# Patient Record
Sex: Male | Born: 1935
Health system: Southern US, Community
[De-identification: ages and names within clinical notes are randomized; demographics above are authoritative.]

## PROBLEM LIST (undated history)

## (undated) DIAGNOSIS — R269 Unspecified abnormalities of gait and mobility: Secondary | ICD-10-CM

## (undated) DIAGNOSIS — E1142 Type 2 diabetes mellitus with diabetic polyneuropathy: Secondary | ICD-10-CM

## (undated) DIAGNOSIS — G629 Polyneuropathy, unspecified: Secondary | ICD-10-CM

## (undated) DIAGNOSIS — J189 Pneumonia, unspecified organism: Secondary | ICD-10-CM

## (undated) DIAGNOSIS — D649 Anemia, unspecified: Secondary | ICD-10-CM

## (undated) DIAGNOSIS — E785 Hyperlipidemia, unspecified: Secondary | ICD-10-CM

## (undated) DIAGNOSIS — I2510 Atherosclerotic heart disease of native coronary artery without angina pectoris: Secondary | ICD-10-CM

## (undated) DIAGNOSIS — K219 Gastro-esophageal reflux disease without esophagitis: Secondary | ICD-10-CM

## (undated) DIAGNOSIS — M21372 Foot drop, left foot: Secondary | ICD-10-CM

## (undated) DIAGNOSIS — I219 Acute myocardial infarction, unspecified: Secondary | ICD-10-CM

## (undated) DIAGNOSIS — I252 Old myocardial infarction: Secondary | ICD-10-CM

## (undated) DIAGNOSIS — M21371 Foot drop, right foot: Secondary | ICD-10-CM

## (undated) DIAGNOSIS — M1990 Unspecified osteoarthritis, unspecified site: Secondary | ICD-10-CM

## (undated) DIAGNOSIS — Z87442 Personal history of urinary calculi: Secondary | ICD-10-CM

## (undated) DIAGNOSIS — I519 Heart disease, unspecified: Secondary | ICD-10-CM

## (undated) DIAGNOSIS — M5416 Radiculopathy, lumbar region: Secondary | ICD-10-CM

## (undated) HISTORY — DX: Hyperlipidemia, unspecified: E78.5

## (undated) HISTORY — DX: Atherosclerotic heart disease of native coronary artery without angina pectoris: I25.10

## (undated) HISTORY — DX: Anemia, unspecified: D64.9

## (undated) HISTORY — DX: Gastro-esophageal reflux disease without esophagitis: K21.9

## (undated) HISTORY — DX: Radiculopathy, lumbar region: M54.16

## (undated) HISTORY — DX: Old myocardial infarction: I25.2

## (undated) HISTORY — PX: COLONOSCOPY: SHX174

## (undated) HISTORY — DX: Type 2 diabetes mellitus with diabetic polyneuropathy: E11.42

## (undated) HISTORY — DX: Unspecified osteoarthritis, unspecified site: M19.90

## (undated) HISTORY — DX: Foot drop, right foot: M21.371

## (undated) HISTORY — DX: Heart disease, unspecified: I51.9

## (undated) HISTORY — PX: TONSILLECTOMY: SHX5217

## (undated) HISTORY — DX: Unspecified abnormalities of gait and mobility: R26.9

## (undated) HISTORY — DX: Foot drop, left foot: M21.372

---

## 1999-09-08 ENCOUNTER — Encounter: Admission: RE | Admit: 1999-09-08 | Discharge: 1999-12-07 | Payer: Self-pay | Admitting: Internal Medicine

## 2000-05-08 ENCOUNTER — Other Ambulatory Visit: Admission: RE | Admit: 2000-05-08 | Discharge: 2000-05-08 | Payer: Self-pay | Admitting: Otolaryngology

## 2002-04-17 DIAGNOSIS — I2510 Atherosclerotic heart disease of native coronary artery without angina pectoris: Secondary | ICD-10-CM

## 2002-04-17 HISTORY — PX: CORONARY ARTERY BYPASS GRAFT: SHX141

## 2002-04-17 HISTORY — DX: Atherosclerotic heart disease of native coronary artery without angina pectoris: I25.10

## 2002-11-06 ENCOUNTER — Inpatient Hospital Stay (HOSPITAL_COMMUNITY): Admission: EM | Admit: 2002-11-06 | Discharge: 2002-11-19 | Payer: Self-pay | Admitting: *Deleted

## 2002-11-06 ENCOUNTER — Encounter: Payer: Self-pay | Admitting: Emergency Medicine

## 2002-11-07 ENCOUNTER — Encounter: Payer: Self-pay | Admitting: Internal Medicine

## 2002-11-08 ENCOUNTER — Encounter: Payer: Self-pay | Admitting: Internal Medicine

## 2002-11-09 ENCOUNTER — Encounter: Payer: Self-pay | Admitting: Internal Medicine

## 2002-11-12 ENCOUNTER — Encounter: Payer: Self-pay | Admitting: Internal Medicine

## 2002-11-14 ENCOUNTER — Encounter: Payer: Self-pay | Admitting: Surgery

## 2002-11-15 ENCOUNTER — Encounter: Payer: Self-pay | Admitting: Surgery

## 2002-11-15 ENCOUNTER — Encounter: Payer: Self-pay | Admitting: Thoracic Surgery (Cardiothoracic Vascular Surgery)

## 2002-11-16 ENCOUNTER — Encounter: Payer: Self-pay | Admitting: Thoracic Surgery (Cardiothoracic Vascular Surgery)

## 2002-11-17 ENCOUNTER — Encounter: Payer: Self-pay | Admitting: Thoracic Surgery (Cardiothoracic Vascular Surgery)

## 2003-01-12 ENCOUNTER — Encounter (HOSPITAL_COMMUNITY): Admission: RE | Admit: 2003-01-12 | Discharge: 2003-04-12 | Payer: Self-pay | Admitting: Cardiology

## 2003-05-01 ENCOUNTER — Encounter: Payer: Self-pay | Admitting: Internal Medicine

## 2004-03-15 ENCOUNTER — Ambulatory Visit: Payer: Self-pay | Admitting: Internal Medicine

## 2004-03-30 ENCOUNTER — Ambulatory Visit: Payer: Self-pay | Admitting: Internal Medicine

## 2004-04-02 ENCOUNTER — Ambulatory Visit: Payer: Self-pay | Admitting: Family Medicine

## 2004-04-06 ENCOUNTER — Ambulatory Visit: Payer: Self-pay | Admitting: Internal Medicine

## 2004-11-14 ENCOUNTER — Ambulatory Visit: Payer: Self-pay | Admitting: Cardiology

## 2004-11-17 ENCOUNTER — Ambulatory Visit: Payer: Self-pay | Admitting: Internal Medicine

## 2004-11-21 ENCOUNTER — Ambulatory Visit: Payer: Self-pay

## 2005-03-22 ENCOUNTER — Ambulatory Visit: Payer: Self-pay | Admitting: Internal Medicine

## 2005-03-27 ENCOUNTER — Ambulatory Visit: Payer: Self-pay | Admitting: Internal Medicine

## 2006-03-23 ENCOUNTER — Ambulatory Visit: Payer: Self-pay | Admitting: Internal Medicine

## 2006-03-23 LAB — CONVERTED CEMR LAB
ALT: 32 units/L (ref 0–40)
AST: 25 units/L (ref 0–37)
Albumin: 4 g/dL (ref 3.5–5.2)
Alkaline Phosphatase: 51 units/L (ref 39–117)
BUN: 24 mg/dL — ABNORMAL HIGH (ref 6–23)
Basophils Absolute: 0 10*3/uL (ref 0.0–0.1)
Basophils Relative: 0 % (ref 0.0–1.0)
CO2: 27 meq/L (ref 19–32)
Calcium: 9.5 mg/dL (ref 8.4–10.5)
Chloride: 104 meq/L (ref 96–112)
Chol/HDL Ratio, serum: 3.6
Cholesterol: 132 mg/dL (ref 0–200)
Creatinine, Ser: 1.3 mg/dL (ref 0.4–1.5)
Creatinine,U: 236.3 mg/dL
Eosinophil percent: 4.5 % (ref 0.0–5.0)
GFR calc non Af Amer: 58 mL/min
Glomerular Filtration Rate, Af Am: 70 mL/min/{1.73_m2}
Glucose, Bld: 173 mg/dL — ABNORMAL HIGH (ref 70–99)
HCT: 39.1 % (ref 39.0–52.0)
HDL: 36.7 mg/dL — ABNORMAL LOW (ref >39.0)
Hemoglobin: 13.3 g/dL (ref 13.0–17.0)
Hgb A1c MFr Bld: 6.9 % — ABNORMAL HIGH (ref 4.6–6.0)
LDL Cholesterol: 84 mg/dL (ref 0–99)
Lymphocytes Relative: 33.9 % (ref 12.0–46.0)
MCHC: 34 g/dL (ref 30.0–36.0)
MCV: 97.2 fL (ref 78.0–100.0)
Microalb Creat Ratio: 2.5 mg/g (ref 0.0–30.0)
Microalb, Ur: 0.6 mg/dL (ref 0.0–1.9)
Monocytes Absolute: 0.5 10*3/uL (ref 0.2–0.7)
Monocytes Relative: 7.5 % (ref 3.0–11.0)
Neutro Abs: 3.2 10*3/uL (ref 1.4–7.7)
Neutrophils Relative %: 54.1 % (ref 43.0–77.0)
PSA: 1.4 ng/mL (ref 0.10–4.00)
Platelets: 180 10*3/uL (ref 150–400)
Potassium: 4.9 meq/L (ref 3.5–5.1)
RBC: 4.02 M/uL — ABNORMAL LOW (ref 4.22–5.81)
RDW: 12.2 % (ref 11.5–14.6)
Sodium: 139 meq/L (ref 135–145)
TSH: 1.1 microintl units/mL (ref 0.35–5.50)
Total Bilirubin: 0.9 mg/dL (ref 0.3–1.2)
Total Protein: 6.4 g/dL (ref 6.0–8.3)
Triglyceride fasting, serum: 56 mg/dL (ref 0–149)
VLDL: 11 mg/dL (ref 0–40)
WBC: 6.1 10*3/uL (ref 4.5–10.5)

## 2006-03-30 ENCOUNTER — Ambulatory Visit: Payer: Self-pay | Admitting: Internal Medicine

## 2006-04-05 ENCOUNTER — Ambulatory Visit: Payer: Self-pay | Admitting: Cardiology

## 2007-03-26 ENCOUNTER — Ambulatory Visit: Payer: Self-pay | Admitting: Internal Medicine

## 2007-03-26 LAB — CONVERTED CEMR LAB
ALT: 34 units/L (ref 0–53)
AST: 26 units/L (ref 0–37)
Albumin: 4 g/dL (ref 3.5–5.2)
Alkaline Phosphatase: 60 units/L (ref 39–117)
BUN: 22 mg/dL (ref 6–23)
Basophils Absolute: 0 10*3/uL (ref 0.0–0.1)
Basophils Relative: 0.5 % (ref 0.0–1.0)
Bilirubin Urine: NEGATIVE
Bilirubin, Direct: 0.2 mg/dL (ref 0.0–0.3)
Blood in Urine, dipstick: NEGATIVE
CO2: 28 meq/L (ref 19–32)
Calcium: 9.5 mg/dL (ref 8.4–10.5)
Chloride: 103 meq/L (ref 96–112)
Cholesterol: 152 mg/dL (ref 0–200)
Creatinine, Ser: 1.1 mg/dL (ref 0.4–1.5)
Creatinine,U: 198.4 mg/dL
Eosinophils Absolute: 0.4 10*3/uL (ref 0.0–0.6)
Eosinophils Relative: 5.4 % — ABNORMAL HIGH (ref 0.0–5.0)
GFR calc Af Amer: 84 mL/min
GFR calc non Af Amer: 70 mL/min
Glucose, Bld: 221 mg/dL — ABNORMAL HIGH (ref 70–99)
HCT: 36.3 % — ABNORMAL LOW (ref 39.0–52.0)
HDL: 28.6 mg/dL — ABNORMAL LOW (ref >39.0)
Hemoglobin: 12.7 g/dL — ABNORMAL LOW (ref 13.0–17.0)
Hgb A1c MFr Bld: 8.7 % — ABNORMAL HIGH (ref 4.6–6.0)
LDL Cholesterol: 99 mg/dL (ref 0–99)
Lymphocytes Relative: 37.4 % (ref 12.0–46.0)
MCHC: 35 g/dL (ref 30.0–36.0)
MCV: 95.7 fL (ref 78.0–100.0)
Microalb Creat Ratio: 5.5 mg/g (ref 0.0–30.0)
Microalb, Ur: 1.1 mg/dL (ref 0.0–1.9)
Monocytes Absolute: 0.5 10*3/uL (ref 0.2–0.7)
Monocytes Relative: 6.8 % (ref 3.0–11.0)
Neutro Abs: 3.5 10*3/uL (ref 1.4–7.7)
Neutrophils Relative %: 49.9 % (ref 43.0–77.0)
Nitrite: NEGATIVE
PSA: 1.41 ng/mL (ref 0.10–4.00)
Platelets: 173 10*3/uL (ref 150–400)
Potassium: 4.7 meq/L (ref 3.5–5.1)
RBC: 3.79 M/uL — ABNORMAL LOW (ref 4.22–5.81)
RDW: 12 % (ref 11.5–14.6)
Sodium: 138 meq/L (ref 135–145)
Specific Gravity, Urine: >=1.03
TSH: 1.17 microintl units/mL (ref 0.35–5.50)
Total Bilirubin: 1 mg/dL (ref 0.3–1.2)
Total CHOL/HDL Ratio: 5.3
Total Protein: 6.3 g/dL (ref 6.0–8.3)
Triglycerides: 123 mg/dL (ref 0–149)
Urobilinogen, UA: NEGATIVE
VLDL: 25 mg/dL (ref 0–40)
WBC Urine, dipstick: NEGATIVE
WBC: 7.1 10*3/uL (ref 4.5–10.5)
pH: 5.5

## 2007-04-19 DIAGNOSIS — E785 Hyperlipidemia, unspecified: Secondary | ICD-10-CM | POA: Insufficient documentation

## 2007-04-19 DIAGNOSIS — E119 Type 2 diabetes mellitus without complications: Secondary | ICD-10-CM | POA: Insufficient documentation

## 2007-04-19 DIAGNOSIS — I252 Old myocardial infarction: Secondary | ICD-10-CM

## 2007-04-19 DIAGNOSIS — K219 Gastro-esophageal reflux disease without esophagitis: Secondary | ICD-10-CM | POA: Insufficient documentation

## 2007-04-19 HISTORY — DX: Old myocardial infarction: I25.2

## 2007-04-25 ENCOUNTER — Telehealth: Payer: Self-pay | Admitting: Internal Medicine

## 2007-04-26 ENCOUNTER — Ambulatory Visit: Payer: Self-pay | Admitting: Internal Medicine

## 2007-05-16 ENCOUNTER — Ambulatory Visit: Payer: Self-pay | Admitting: Internal Medicine

## 2007-05-30 ENCOUNTER — Ambulatory Visit: Payer: Self-pay

## 2007-05-30 ENCOUNTER — Encounter: Payer: Self-pay | Admitting: Internal Medicine

## 2007-08-05 ENCOUNTER — Telehealth: Payer: Self-pay | Admitting: Internal Medicine

## 2007-08-16 ENCOUNTER — Ambulatory Visit: Payer: Self-pay | Admitting: Internal Medicine

## 2007-08-16 LAB — CONVERTED CEMR LAB
AST: 25 units/L (ref 0–37)
Calcium: 9.4 mg/dL (ref 8.4–10.5)
Chloride: 103 meq/L (ref 96–112)
Creatinine, Ser: 1 mg/dL (ref 0.4–1.5)
GFR calc non Af Amer: 78 mL/min
Hgb A1c MFr Bld: 13.5 % — ABNORMAL HIGH (ref 4.6–6.0)
LDL Cholesterol: 119 mg/dL — ABNORMAL HIGH (ref 0–99)
Total Bilirubin: 0.9 mg/dL (ref 0.3–1.2)
Total CHOL/HDL Ratio: 6
Triglycerides: 173 mg/dL — ABNORMAL HIGH (ref 0–149)

## 2007-08-22 ENCOUNTER — Ambulatory Visit: Payer: Self-pay | Admitting: Internal Medicine

## 2007-09-05 ENCOUNTER — Ambulatory Visit: Payer: Self-pay | Admitting: Internal Medicine

## 2007-10-28 ENCOUNTER — Ambulatory Visit: Payer: Self-pay | Admitting: Internal Medicine

## 2007-12-19 ENCOUNTER — Encounter: Payer: Self-pay | Admitting: Internal Medicine

## 2007-12-25 ENCOUNTER — Encounter: Admission: RE | Admit: 2007-12-25 | Discharge: 2007-12-25 | Payer: Self-pay | Admitting: Internal Medicine

## 2008-01-20 ENCOUNTER — Ambulatory Visit: Payer: Self-pay | Admitting: Internal Medicine

## 2008-01-21 LAB — CONVERTED CEMR LAB
Albumin: 4.1 g/dL (ref 3.5–5.2)
BUN: 31 mg/dL — ABNORMAL HIGH (ref 6–23)
Calcium: 9.3 mg/dL (ref 8.4–10.5)
Cholesterol: 148 mg/dL (ref 0–200)
Creatinine, Ser: 1.1 mg/dL (ref 0.4–1.5)
GFR calc Af Amer: 84 mL/min
Glucose, Bld: 153 mg/dL — ABNORMAL HIGH (ref 70–99)
HDL: 30.5 mg/dL — ABNORMAL LOW (ref >39.0)
Total Protein: 6.4 g/dL (ref 6.0–8.3)
VLDL: 26 mg/dL (ref 0–40)

## 2008-01-27 ENCOUNTER — Ambulatory Visit: Payer: Self-pay | Admitting: Internal Medicine

## 2008-04-20 ENCOUNTER — Ambulatory Visit: Payer: Self-pay | Admitting: Internal Medicine

## 2008-04-20 DIAGNOSIS — I6529 Occlusion and stenosis of unspecified carotid artery: Secondary | ICD-10-CM | POA: Insufficient documentation

## 2008-04-27 ENCOUNTER — Telehealth: Payer: Self-pay | Admitting: Internal Medicine

## 2008-06-02 ENCOUNTER — Ambulatory Visit: Payer: Self-pay

## 2008-06-03 ENCOUNTER — Ambulatory Visit: Payer: Self-pay | Admitting: Internal Medicine

## 2008-06-03 LAB — CONVERTED CEMR LAB
ALT: 29 units/L (ref 0–53)
AST: 24 units/L (ref 0–37)
Alkaline Phosphatase: 69 units/L (ref 39–117)
BUN: 25 mg/dL — ABNORMAL HIGH (ref 6–23)
CO2: 27 meq/L (ref 19–32)
Chloride: 103 meq/L (ref 96–112)
Cholesterol: 158 mg/dL (ref 0–200)
Creatinine, Ser: 1.1 mg/dL (ref 0.4–1.5)
Hgb A1c MFr Bld: 8.7 % — ABNORMAL HIGH (ref 4.6–6.0)
Total Bilirubin: 1 mg/dL (ref 0.3–1.2)
Total CHOL/HDL Ratio: 5
Triglycerides: 149 mg/dL (ref 0–149)

## 2008-06-10 ENCOUNTER — Ambulatory Visit: Payer: Self-pay | Admitting: Internal Medicine

## 2008-10-07 ENCOUNTER — Ambulatory Visit: Payer: Self-pay | Admitting: Internal Medicine

## 2008-10-07 LAB — CONVERTED CEMR LAB
AST: 22 units/L (ref 0–37)
Albumin: 3.7 g/dL (ref 3.5–5.2)
Alkaline Phosphatase: 53 units/L (ref 39–117)
Calcium: 9.2 mg/dL (ref 8.4–10.5)
Creatinine, Ser: 1.1 mg/dL (ref 0.4–1.5)
Hgb A1c MFr Bld: 7.4 % — ABNORMAL HIGH (ref 4.6–6.5)
Sodium: 141 meq/L (ref 135–145)
Total CHOL/HDL Ratio: 6
Total Protein: 6.4 g/dL (ref 6.0–8.3)
VLDL: 35.2 mg/dL (ref 0.0–40.0)

## 2008-10-14 ENCOUNTER — Ambulatory Visit: Payer: Self-pay | Admitting: Internal Medicine

## 2008-10-23 ENCOUNTER — Ambulatory Visit: Payer: Self-pay | Admitting: Internal Medicine

## 2008-12-10 ENCOUNTER — Encounter: Payer: Self-pay | Admitting: Internal Medicine

## 2008-12-17 ENCOUNTER — Telehealth: Payer: Self-pay | Admitting: Internal Medicine

## 2008-12-22 ENCOUNTER — Emergency Department (HOSPITAL_COMMUNITY): Admission: EM | Admit: 2008-12-22 | Discharge: 2008-12-22 | Payer: Self-pay | Admitting: Emergency Medicine

## 2008-12-22 ENCOUNTER — Ambulatory Visit: Payer: Self-pay | Admitting: Family Medicine

## 2009-01-25 ENCOUNTER — Encounter: Payer: Self-pay | Admitting: Internal Medicine

## 2009-02-09 ENCOUNTER — Ambulatory Visit: Payer: Self-pay | Admitting: Internal Medicine

## 2009-02-23 ENCOUNTER — Telehealth: Payer: Self-pay | Admitting: Internal Medicine

## 2009-02-24 ENCOUNTER — Telehealth: Payer: Self-pay | Admitting: Internal Medicine

## 2009-03-03 ENCOUNTER — Encounter (INDEPENDENT_AMBULATORY_CARE_PROVIDER_SITE_OTHER): Payer: Self-pay | Admitting: *Deleted

## 2009-03-05 ENCOUNTER — Encounter (INDEPENDENT_AMBULATORY_CARE_PROVIDER_SITE_OTHER): Payer: Self-pay | Admitting: *Deleted

## 2009-04-26 ENCOUNTER — Encounter: Payer: Self-pay | Admitting: Internal Medicine

## 2009-05-03 ENCOUNTER — Ambulatory Visit: Payer: Self-pay | Admitting: Internal Medicine

## 2009-05-03 LAB — CONVERTED CEMR LAB
ALT: 31 units/L (ref 0–53)
AST: 25 units/L (ref 0–37)
Albumin: 4.1 g/dL (ref 3.5–5.2)
Basophils Absolute: 0 10*3/uL (ref 0.0–0.1)
Bilirubin Urine: NEGATIVE
CO2: 21 meq/L (ref 19–32)
Chloride: 107 meq/L (ref 96–112)
Creatinine,U: 124.2 mg/dL
GFR calc non Af Amer: 69.15 mL/min (ref >60)
Glucose, Bld: 211 mg/dL — ABNORMAL HIGH (ref 70–99)
Glucose, Urine, Semiquant: NEGATIVE
HCT: 38 % — ABNORMAL LOW (ref 39.0–52.0)
Hemoglobin: 12.6 g/dL — ABNORMAL LOW (ref 13.0–17.0)
Lymphs Abs: 2.3 10*3/uL (ref 0.7–4.0)
MCHC: 33.1 g/dL (ref 30.0–36.0)
MCV: 94.4 fL (ref 78.0–100.0)
Monocytes Relative: 8 % (ref 3.0–12.0)
Neutro Abs: 4.3 10*3/uL (ref 1.4–7.7)
Potassium: 4.8 meq/L (ref 3.5–5.1)
RDW: 12.1 % (ref 11.5–14.6)
Sodium: 141 meq/L (ref 135–145)
Specific Gravity, Urine: 1.025
TSH: 1.45 microintl units/mL (ref 0.35–5.50)
WBC Urine, dipstick: NEGATIVE
pH: 5

## 2009-05-11 ENCOUNTER — Ambulatory Visit: Payer: Self-pay | Admitting: Internal Medicine

## 2009-05-11 ENCOUNTER — Encounter (INDEPENDENT_AMBULATORY_CARE_PROVIDER_SITE_OTHER): Payer: Self-pay | Admitting: *Deleted

## 2009-05-21 ENCOUNTER — Ambulatory Visit: Payer: Self-pay | Admitting: Internal Medicine

## 2009-05-21 DIAGNOSIS — I25810 Atherosclerosis of coronary artery bypass graft(s) without angina pectoris: Secondary | ICD-10-CM | POA: Insufficient documentation

## 2009-05-26 ENCOUNTER — Telehealth: Payer: Self-pay | Admitting: Internal Medicine

## 2009-05-27 ENCOUNTER — Telehealth: Payer: Self-pay | Admitting: Internal Medicine

## 2009-05-31 ENCOUNTER — Ambulatory Visit: Payer: Self-pay | Admitting: Internal Medicine

## 2009-06-03 ENCOUNTER — Telehealth: Payer: Self-pay | Admitting: Internal Medicine

## 2009-06-03 ENCOUNTER — Encounter: Payer: Self-pay | Admitting: Internal Medicine

## 2009-06-03 LAB — CONVERTED CEMR LAB
Folate: >20 ng/mL
Saturation Ratios: 19.1 % — ABNORMAL LOW (ref 20.0–50.0)
Transferrin: 302.3 mg/dL (ref 212.0–360.0)
Vitamin B-12: >1500 pg/mL — ABNORMAL HIGH (ref 211–911)

## 2009-06-04 ENCOUNTER — Encounter: Payer: Self-pay | Admitting: Internal Medicine

## 2009-06-04 ENCOUNTER — Ambulatory Visit: Payer: Self-pay

## 2009-07-13 ENCOUNTER — Encounter (INDEPENDENT_AMBULATORY_CARE_PROVIDER_SITE_OTHER): Payer: Self-pay | Admitting: *Deleted

## 2009-07-14 ENCOUNTER — Ambulatory Visit: Payer: Self-pay | Admitting: Internal Medicine

## 2009-07-14 ENCOUNTER — Encounter (INDEPENDENT_AMBULATORY_CARE_PROVIDER_SITE_OTHER): Payer: Self-pay | Admitting: *Deleted

## 2009-07-15 ENCOUNTER — Telehealth: Payer: Self-pay | Admitting: Internal Medicine

## 2009-07-16 ENCOUNTER — Telehealth: Payer: Self-pay | Admitting: Internal Medicine

## 2009-07-21 ENCOUNTER — Ambulatory Visit: Payer: Self-pay | Admitting: Internal Medicine

## 2009-07-22 ENCOUNTER — Encounter: Payer: Self-pay | Admitting: Internal Medicine

## 2009-08-10 ENCOUNTER — Telehealth (INDEPENDENT_AMBULATORY_CARE_PROVIDER_SITE_OTHER): Payer: Self-pay | Admitting: *Deleted

## 2009-08-30 ENCOUNTER — Ambulatory Visit: Payer: Self-pay | Admitting: Internal Medicine

## 2009-09-01 LAB — CONVERTED CEMR LAB
Basophils Absolute: 0 10*3/uL (ref 0.0–0.1)
Eosinophils Absolute: 0.2 10*3/uL (ref 0.0–0.7)
Lymphocytes Relative: 31.7 % (ref 12.0–46.0)
MCHC: 35 g/dL (ref 30.0–36.0)
Neutrophils Relative %: 56.6 % (ref 43.0–77.0)
Platelets: 217 10*3/uL (ref 150.0–400.0)
RDW: 13.4 % (ref 11.5–14.6)

## 2009-09-02 ENCOUNTER — Ambulatory Visit: Payer: Self-pay | Admitting: Internal Medicine

## 2009-09-02 LAB — CONVERTED CEMR LAB
ALT: 27 units/L (ref 0–53)
AST: 22 units/L (ref 0–37)
BUN: 33 mg/dL — ABNORMAL HIGH (ref 6–23)
Bilirubin, Direct: 0.1 mg/dL (ref 0.0–0.3)
CO2: 27 meq/L (ref 19–32)
Chloride: 104 meq/L (ref 96–112)
Cholesterol: 157 mg/dL (ref 0–200)
Creatinine, Ser: 1.1 mg/dL (ref 0.4–1.5)
Hgb A1c MFr Bld: 8.1 % — ABNORMAL HIGH (ref 4.6–6.5)
Total Protein: 6.3 g/dL (ref 6.0–8.3)
Triglycerides: 153 mg/dL — ABNORMAL HIGH (ref 0.0–149.0)

## 2009-09-07 ENCOUNTER — Ambulatory Visit: Payer: Self-pay | Admitting: Internal Medicine

## 2009-09-07 DIAGNOSIS — Z860100 Personal history of colon polyps, unspecified: Secondary | ICD-10-CM | POA: Insufficient documentation

## 2009-09-07 DIAGNOSIS — D509 Iron deficiency anemia, unspecified: Secondary | ICD-10-CM | POA: Insufficient documentation

## 2009-09-09 ENCOUNTER — Ambulatory Visit: Payer: Self-pay | Admitting: Internal Medicine

## 2009-09-20 ENCOUNTER — Ambulatory Visit: Payer: Self-pay | Admitting: Family Medicine

## 2009-09-29 ENCOUNTER — Ambulatory Visit: Payer: Self-pay | Admitting: Internal Medicine

## 2009-12-02 ENCOUNTER — Encounter: Payer: Self-pay | Admitting: Internal Medicine

## 2009-12-03 ENCOUNTER — Ambulatory Visit: Payer: Self-pay

## 2009-12-03 ENCOUNTER — Encounter: Payer: Self-pay | Admitting: Internal Medicine

## 2009-12-15 ENCOUNTER — Encounter: Payer: Self-pay | Admitting: Internal Medicine

## 2009-12-15 LAB — HM DIABETES EYE EXAM: HM Diabetic Eye Exam: NORMAL

## 2009-12-29 ENCOUNTER — Telehealth: Payer: Self-pay | Admitting: Internal Medicine

## 2009-12-31 ENCOUNTER — Ambulatory Visit: Payer: Self-pay | Admitting: Internal Medicine

## 2010-01-10 ENCOUNTER — Ambulatory Visit: Payer: Self-pay | Admitting: Internal Medicine

## 2010-01-18 ENCOUNTER — Ambulatory Visit: Payer: Self-pay | Admitting: Internal Medicine

## 2010-01-18 LAB — CONVERTED CEMR LAB
BUN: 31 mg/dL — ABNORMAL HIGH (ref 6–23)
Basophils Relative: 0.7 % (ref 0.0–3.0)
Chloride: 104 meq/L (ref 96–112)
Cholesterol: 164 mg/dL (ref 0–200)
Eosinophils Relative: 5.1 % — ABNORMAL HIGH (ref 0.0–5.0)
HCT: 35.3 % — ABNORMAL LOW (ref 39.0–52.0)
Hemoglobin: 12.2 g/dL — ABNORMAL LOW (ref 13.0–17.0)
LDL Cholesterol: 104 mg/dL — ABNORMAL HIGH (ref 0–99)
Lymphs Abs: 2 10*3/uL (ref 0.7–4.0)
MCV: 94.1 fL (ref 78.0–100.0)
Monocytes Absolute: 0.5 10*3/uL (ref 0.1–1.0)
Monocytes Relative: 8.9 % (ref 3.0–12.0)
Neutro Abs: 2.6 10*3/uL (ref 1.4–7.7)
Platelets: 195 10*3/uL (ref 150.0–400.0)
Potassium: 4.6 meq/L (ref 3.5–5.1)
RBC: 3.75 M/uL — ABNORMAL LOW (ref 4.22–5.81)
Sodium: 138 meq/L (ref 135–145)
WBC: 5.5 10*3/uL (ref 4.5–10.5)

## 2010-01-26 ENCOUNTER — Telehealth: Payer: Self-pay | Admitting: Internal Medicine

## 2010-01-28 LAB — CONVERTED CEMR LAB
FSH: 13.8 milliintl units/mL (ref 1.4–18.1)
PSA: 1.11 ng/mL (ref 0.10–4.00)
Prolactin: 12 ng/mL (ref 2.1–17.1)

## 2010-01-31 ENCOUNTER — Telehealth: Payer: Self-pay | Admitting: Internal Medicine

## 2010-02-04 ENCOUNTER — Ambulatory Visit: Payer: Self-pay | Admitting: Internal Medicine

## 2010-02-04 DIAGNOSIS — E291 Testicular hypofunction: Secondary | ICD-10-CM | POA: Insufficient documentation

## 2010-02-08 ENCOUNTER — Ambulatory Visit: Payer: Self-pay | Admitting: Internal Medicine

## 2010-03-08 ENCOUNTER — Ambulatory Visit: Payer: Self-pay | Admitting: Internal Medicine

## 2010-04-04 ENCOUNTER — Ambulatory Visit: Payer: Self-pay | Admitting: Internal Medicine

## 2010-04-20 ENCOUNTER — Telehealth: Payer: Self-pay | Admitting: Internal Medicine

## 2010-05-09 ENCOUNTER — Other Ambulatory Visit: Payer: Self-pay | Admitting: Internal Medicine

## 2010-05-09 ENCOUNTER — Ambulatory Visit
Admission: RE | Admit: 2010-05-09 | Discharge: 2010-05-09 | Payer: Self-pay | Source: Home / Self Care | Attending: Internal Medicine | Admitting: Internal Medicine

## 2010-05-09 ENCOUNTER — Telehealth: Payer: Self-pay | Admitting: Internal Medicine

## 2010-05-09 LAB — CONVERTED CEMR LAB
Bilirubin Urine: NEGATIVE
Glucose, Urine, Semiquant: NEGATIVE
Ketones, urine, test strip: NEGATIVE
Specific Gravity, Urine: 1.02

## 2010-05-09 LAB — HEPATIC FUNCTION PANEL
ALT: 24 U/L (ref 0–53)
AST: 22 U/L (ref 0–37)
Albumin: 4 g/dL (ref 3.5–5.2)
Alkaline Phosphatase: 53 U/L (ref 39–117)
Total Protein: 6.4 g/dL (ref 6.0–8.3)

## 2010-05-09 LAB — CBC WITH DIFFERENTIAL/PLATELET
Basophils Absolute: 0 10*3/uL (ref 0.0–0.1)
Basophils Relative: 0.6 % (ref 0.0–3.0)
HCT: 41.8 % (ref 39.0–52.0)
Hemoglobin: 14.1 g/dL (ref 13.0–17.0)
Lymphocytes Relative: 34.1 % (ref 12.0–46.0)
Lymphs Abs: 2.3 10*3/uL (ref 0.7–4.0)
MCHC: 33.8 g/dL (ref 30.0–36.0)
Monocytes Relative: 9 % (ref 3.0–12.0)
Neutro Abs: 3.5 10*3/uL (ref 1.4–7.7)
RBC: 4.4 Mil/uL (ref 4.22–5.81)
RDW: 13.1 % (ref 11.5–14.6)

## 2010-05-09 LAB — TSH: TSH: 1.36 u[IU]/mL (ref 0.35–5.50)

## 2010-05-09 LAB — BASIC METABOLIC PANEL
CO2: 25 mEq/L (ref 19–32)
GFR: 65.52 mL/min (ref >60.00)
Glucose, Bld: 217 mg/dL — ABNORMAL HIGH (ref 70–99)
Potassium: 4.7 mEq/L (ref 3.5–5.1)
Sodium: 135 mEq/L (ref 135–145)

## 2010-05-09 LAB — LIPID PANEL: Total CHOL/HDL Ratio: 5

## 2010-05-09 LAB — MICROALBUMIN / CREATININE URINE RATIO: Microalb Creat Ratio: 1.7 mg/g (ref 0.0–30.0)

## 2010-05-09 LAB — PSA: PSA: 1.61 ng/mL (ref 0.10–4.00)

## 2010-05-16 ENCOUNTER — Ambulatory Visit
Admission: RE | Admit: 2010-05-16 | Discharge: 2010-05-16 | Payer: Self-pay | Source: Home / Self Care | Attending: Internal Medicine | Admitting: Internal Medicine

## 2010-05-17 NOTE — Progress Notes (Signed)
Summary: testosterone lab check  Phone Note Call from Patient Call back at Work Phone [phone] Call back at [phone]   Caller: vm Summary of Call: Dr. Marina Goodell, when I had colonoscopy, said to check for low Testosterone the next time I have labs done.  Having labs Fri, Sept 16.   Maybe he's done it, never have discussed with Dr. Cato Mulligan.  Want to be sure it's on the list of things to check.  Any questions call me back.   Initial call taken by: Rudy Jew, RN,  December 29, 2009 4:09 PM  Follow-up for Phone Call        ok to draw Follow-up by: Birdie Sons MD,  December 30, 2009 1:33 PM  Additional Follow-up for Phone Call Additional follow up Details #1::        done  Additional Follow-up by: Lynann Beaver CMA,  December 30, 2009 5:05 PM

## 2010-05-17 NOTE — Letter (Signed)
Summary: Patient Notice- Polyp Results  [facility]  [address] [place], [place] [address]   Phone: [phone]  Fax: [phone]        July 22, 2009 MRN: [id number]    [facility] [address] [place], [place]  [address]    Dear Mr. Mccaskill,  I am pleased to inform you that the colon polyp(s) removed during your recent colonoscopy was (were) found to be benign (no cancer detected) upon pathologic examination.  I recommend you have a repeat colonoscopy examination in 5 years to look for recurrent polyps, as having colon polyps increases your risk for having recurrent polyps or even colon cancer in the future.    ALSO, THE STOMACH POLYPS WERE BENIGN,AS EXPECTED, WITH NOTHING FURTHER NEEDING TO BE DONE.   Should you develop new or worsening symptoms of abdominal pain, bowel habit changes or bleeding from the rectum or bowels, please schedule an evaluation with either your primary care physician or with me.    Additional information/recommendations:  __ No further action with gastroenterology is needed at this time. Please      follow-up with your primary care physician for your other healthcare      needs.   Please call us if you are having persistent problems or have questions about your condition that have not been fully answered at this time.  Sincerely,  Hilarie Fredrickson MD  This letter has been electronically signed by your physician.  Appended Document: Patient Notice- Polyp Results letter mailed 4.11.11

## 2010-05-17 NOTE — Procedures (Signed)
Summary: Colonoscopy Report/[facility]  Colonoscopy Report/ Endo   Imported By: Maryln Gottron 05/12/2009 15:58:50  _____________________________________________________________________  External Attachment:    Type:   Image     Comment:   External Document

## 2010-05-17 NOTE — Progress Notes (Signed)
Summary: Pharmacy need to verify medication   Phone Note From Pharmacy   [phone] Summary of Call: Pharmacy need to verify Patanol Initial call taken by: Judie Grieve,  May 27, 2009 3:43 PM  Follow-up for Phone Call        This has been done.  See phone note to Dr Cato Mulligan. Follow-up by: Gladis Riffle, RN,  May 27, 2009 4:24 PM  Additional Follow-up for Phone Call Additional follow up Details #1::        F/u in medicine for patanol Additional Follow-up by: Sherrill Raring, MD, [facility],  May 28, 2009 6:13 PM

## 2010-05-17 NOTE — Assessment & Plan Note (Signed)
Summary: f/u post procedure (anemia)   History of Present Illness Visit Type: Follow-up Visit Primary GI MD: Yancey Flemings MD Primary Provider: Birdie Sons MD Requesting Provider: n/a Chief Complaint: iron deficient anemia, pt has no complaints since procedure History of Present Illness:   77 year old white male with multiple medical problems including coronary artery disease status post coronary artery bypass grafting, type 2 diabetes mellitus, hyperlipidemia, and GERD. The patient was evaluated in the office May 31, 2009 for anemia. This was normocytic. He subsequently underwent an anemia panel which suggested iron deficiency. He subsequently underwent colonoscopy and upper endoscopy on July 22, 1999. Upper endoscopy revealed pedunculated gastric polyps which were biopsied and returned benign. Also mild nonerosive duodenitis. Negative H. pylori testing. Otherwise normal. Colonoscopy revealed a diminutive sigmoid colon polyp, moderate diverticulosis, and internal hemorrhoids. Otherwise normal. The ileum was normal. The polyp was adenomatous and follow up in 5 years recommended. He has continued on twice daily. He states he is feeling quite well. No fatigue. He states his blood sugar has been under better control. GI review of systems is entirely negative. Reviewed his workup in detail. Multiple questions answered to his satisfaction.   GI Review of Systems      Denies abdominal pain, acid reflux, belching, bloating, chest pain, dysphagia with liquids, dysphagia with solids, heartburn, loss of appetite, nausea, vomiting, vomiting blood, weight loss, and  weight gain.        Denies anal fissure, black tarry stools, change in bowel habit, constipation, diarrhea, diverticulosis, fecal incontinence, heme positive stool, hemorrhoids, irritable bowel syndrome, jaundice, light color stool, liver problems, rectal bleeding, and  rectal pain.    Current Medications (verified): 1)  Fluticasone  Propionate 50 Mcg/act  Susp (Fluticasone Propionate) .... One Spray Each Nostril Every Day 2)  Altace 10 Mg Caps (Ramipril) .... Take 1 Capsule By Mouth Once A Day 3)  Diclofenac Sodium 75 Mg Tbec (Diclofenac Sodium) .... Take 1 Tablet By Mouth Twice A Day 4)  Famotidine 20 Mg Tabs (Famotidine) .... Take 1 Tablet By Mouth Twice A Day As Needed 5)  Patanol 0.1 % Soln (Olopatadine Hcl) .... Apply 1 Drop Into Both Eyes Twice A Day 6)  Simvastatin 40 Mg Tabs (Simvastatin) .... Take 1 Tablet By Mouth At Bedtime 7)  Toprol Xl 25 Mg Tb24 (Metoprolol Succinate) .... Take 1 Tablet By Mouth Once A Day 8)  Glyburide-Metformin 5-500 Mg Tabs (Glyburide-Metformin) .... Take 1 Tablet By Mouth Two Times A Day 9)  Freestyle Lite Test   Strp (Glucose Blood) .... Use Once Daily As Directed 10)  Freestyle Unistick Ii Lancets   Misc (Lancets) .... Use As Directed 11)  Daily Multi  Tabs (Multiple Vitamins-Minerals) .... Take One By Mouth Once Daily 12)  Fish Oil 1000 Mg Caps (Omega-3 Fatty Acids) .... Take One By Mouth Once Daily 13)  Ocuvite  Tabs (Multiple Vitamins-Minerals) .... Take Two Tabs By Mouth Once Daily 14)  Ferrous Sulfate 325 (65 Fe) Mg Tabs (Ferrous Sulfate) .... Take 1 Tablet By Mouth  Two Times A Day  Allergies (verified): No Known Drug Allergies  Past History:  Past Medical History: Reviewed history from 05/31/2009 and no changes required. Coronary artery disease. S/P inferior wall MI 2004 with PTCA/Stent; s/p CABG 2004 Diabetes mellitus, type II GERD Hyperlipidemia Myocardial infarction, hx of Anemia  Past Surgical History: Reviewed history from 05/31/2009 and no changes required. Coronary artery bypass graft 2004 (LIMA to LAD; SVG to ramus intermedius; SVG to PDA/PLSA Tonsillectomy  Family  History: Reviewed history from 05/31/2009 and no changes required. father died at age 63 --hx CVA, hypertension mother died age 87 some form cirrhosis,not clear of etiology Blood  transfusions aunt died with cirrohisis related complications  age 31 blood transfusions two sisters--one with breast cancer age 96 paternal grandparents died in mid 60's of stroke maternal grandparents--grandmotherdied of stroke age 61, grandfather died of MI age 78 No FH of Colon Cancer:  Social History: Reviewed history from 05/31/2009 and no changes required. Occupation:-financial Former Smoker pipe Alcohol use-yes Regular exercise-no Daily Caffeine Use coffee 3 cups a day Illicit Drug Use - no  Review of Systems  The patient denies allergy/sinus, anemia, anxiety-new, arthritis/joint pain, back pain, blood in urine, breast changes/lumps, change in vision, confusion, cough, coughing up blood, depression-new, fainting, fatigue, fever, headaches-new, hearing problems, heart murmur, heart rhythm changes, itching, muscle pains/cramps, night sweats, nosebleeds, shortness of breath, skin rash, sleeping problems, sore throat, swelling of feet/legs, swollen lymph glands, thirst - excessive, urination - excessive, urination changes/pain, urine leakage, and voice change.    Vital Signs:  Patient profile:   77 year old male Height:      66 inches Weight:      183 pounds BMI:     29.64 Pulse rate:   64 / minute Pulse rhythm:   regular BP sitting:   104 / 60  (left arm) Cuff size:   regular  Vitals Entered By: Francee Piccolo CMA Duncan Dull) (Sep 07, 2009 2:52 PM)  Physical Exam  General:  Well developed, well nourished, no acute distress. Abdomen:  not reexamined Pulses:  Normal pulses noted. Neurologic:  Alert and  oriented x4;  Skin:  normal pallor Psych:  Alert and cooperative. Normal mood and affect.   Impression & Recommendations:  Problem # 1:  ANEMIA-IRON DEFICIENCY (ICD-280.9) mild anemia, possibly iron deficient. Currently asymptomatic with a hemoglobin 12.6 on iron. GI evaluations as described.  Plan: #1. Continue iron, once daily #2. Periodic CBC with Dr. Cato Mulligan #3.  GI followup p.r.n.  Problem # 2:  PERSONAL HX COLONIC POLYPS (ICD-V12.72) adenomatous colon polyp on recent colonoscopy.  Plan: #1. Colonoscopy in 5 years if medically fit and willing  Patient Instructions: 1)  Please schedule a follow-up appointment as needed.  2)  The medication list was reviewed and reconciled.  All changed / newly prescribed medications were explained.  A complete medication list was provided to the patient / caregiver.

## 2010-05-17 NOTE — Assessment & Plan Note (Signed)
Summary: 4 mo rov/mm   Vital Signs:  Patient profile:   77 year old male Weight:      182 pounds Pulse rate:   76 / minute Pulse rhythm:   regular Resp:     12 per minute BP sitting:   110 / 58  (left arm) Cuff size:   regular  Vitals Entered By: Gladis Riffle, RN (Sep 09, 2009 10:00 AM) CC: 4 month rov--CBGs not done regularly at home Is Patient Diabetic? Yes Did you bring your meter with you today? No   Primary Care Provider:  Birdie Sons MD  CC:  4 month rov--CBGs not done regularly at home.  History of Present Illness:  Follow-Up Visit      This is a 77 year old man who presents for Follow-up visit.  The patient denies chest pain and palpitations.  Since the last visit the patient notes no new problems or concerns.  The patient reports taking meds as prescribed.  When questioned about possible medication side effects, the patient notes none.    All other systems reviewed and were negative   Preventive Screening-Counseling & Management  Alcohol-Tobacco     Smoking Status: quit     Year Started: pipe smoker  Current Problems (verified): 1)  Personal Hx Colonic Polyps  (ICD-V12.72) 2)  Anemia-iron Deficiency  (ICD-280.9) 3)  Duodenitis  (ICD-535.60) 4)  Cad, Artery Bypass Graft  (ICD-414.04) 5)  Anemia, Other Unspec  (ICD-285.9) 6)  Myocardial Infarction, Hx of  (ICD-412) 7)  Carotid Artery Stenosis, Without Infarction  (ICD-433.10) 8)  Hyperlipidemia  (ICD-272.4) 9)  Gerd  (ICD-530.81) 10)  Diabetes Mellitus, Type II  (ICD-250.00) 11)  Physical Examination  (ICD-V70.0)  Current Medications (verified): 1)  Fluticasone Propionate 50 Mcg/act  Susp (Fluticasone Propionate) .... Two Sprays Each Nostril Every Day 2)  Altace 10 Mg Caps (Ramipril) .... Take 1 Capsule By Mouth Once A Day 3)  Diclofenac Sodium 75 Mg Tbec (Diclofenac Sodium) .... Take 1 Tablet By Mouth Twice A Day 4)  Famotidine 20 Mg Tabs (Famotidine) .... Take 1 Tablet By Mouth Twice A Day As Needed 5)   Patanol 0.1 % Soln (Olopatadine Hcl) .... Apply 1 Drop Into Both Eyes Twice A Day 6)  Simvastatin 40 Mg Tabs (Simvastatin) .... Take 1 Tablet By Mouth At Bedtime 7)  Toprol Xl 25 Mg Tb24 (Metoprolol Succinate) .... Take 1 Tablet By Mouth Once A Day 8)  Glyburide-Metformin 5-500 Mg Tabs (Glyburide-Metformin) .... Take 1 Tablet By Mouth Two Times A Day 9)  Freestyle Lite Test   Strp (Glucose Blood) .... Use Once Daily As Directed 10)  Freestyle Unistick Ii Lancets   Misc (Lancets) .... Use As Directed 11)  Daily Multi  Tabs (Multiple Vitamins-Minerals) .... Take One By Mouth Once Daily 12)  Fish Oil 1000 Mg Caps (Omega-3 Fatty Acids) .... Take One By Mouth Once Daily 13)  Ocuvite  Tabs (Multiple Vitamins-Minerals) .... Take Two Tabs By Mouth Once Daily 14)  Ferrous Sulfate 325 (65 Fe) Mg Tabs (Ferrous Sulfate) .... Take 1 Tablet By Mouth One  Time A Day  Allergies (verified): No Known Drug Allergies  Past History:  Past Medical History: Last updated: 05/31/2009 Coronary artery disease. S/P inferior wall MI 2004 with PTCA/Stent; s/p CABG 2004 Diabetes mellitus, type II GERD Hyperlipidemia Myocardial infarction, hx of Anemia  Past Surgical History: Last updated: 05/31/2009 Coronary artery bypass graft 2004 (LIMA to LAD; SVG to ramus intermedius; SVG to PDA/PLSA Tonsillectomy  Family History:  Last updated: 05/31/2009 father died at age 33 --hx CVA, hypertension mother died age 43 some form cirrhosis,not clear of etiology Blood transfusions aunt died with cirrohisis related complications  age 78 blood transfusions two sisters--one with breast cancer age 55 paternal grandparents died in mid 10's of stroke maternal grandparents--grandmotherdied of stroke age 1, grandfather died of MI age 41 No FH of Colon Cancer:  Social History: Last updated: 05/31/2009 Occupation:-financial Former Smoker pipe Alcohol use-yes Regular exercise-no Daily Caffeine Use coffee 3 cups a day Illicit  Drug Use - no  Risk Factors: Exercise: no (04/26/2007)  Risk Factors: Smoking Status: quit (09/09/2009)  Physical Exam  General:  alert and well-developed.   Head:  normocephalic and atraumatic.   Eyes:  pupils equal and pupils round.   Ears:  R ear normal and L ear normal.   Neck:  No deformities, masses, or tenderness noted. Chest Wall:  status post sternotomy Lungs:  Normal respiratory effort, chest expands symmetrically. Lungs are clear to auscultation, no crackles or wheezes. Heart:  normal rate and regular rhythm.   Abdomen:  obese soft and nontender.  There are active bowel sounds. overweight Msk:  No deformity or scoliosis noted of thoracic or lumbar spine.   Neurologic:  cranial nerves II-XII intact and gait normal.     Impression & Recommendations:  Problem # 1:  DIABETES MELLITUS, TYPE II (ICD-250.00) he is doing better from dietary point of view---has eliminated sugary drinks His updated medication list for this problem includes:    Altace 10 Mg Caps (Ramipril) .Marland Kitchen... Take 1 capsule by mouth once a day    Glyburide-metformin 5-500 Mg Tabs (Glyburide-metformin) .Marland Kitchen... Take 1 tablet by mouth two times a day  Labs Reviewed: Creat: 1.1 (09/02/2009)     Last Eye Exam: normal-pt's report (04/17/2009) Reviewed HgBA1c results: 8.1 (09/02/2009)  8.2 (08/30/2009)  Problem # 2:  CAD, ARTERY BYPASS GRAFT (ICD-414.04) no sxs His updated medication list for this problem includes:    Altace 10 Mg Caps (Ramipril) .Marland Kitchen... Take 1 capsule by mouth once a day    Toprol Xl 25 Mg Tb24 (Metoprolol succinate) .Marland Kitchen... Take 1 tablet by mouth once a day  Problem # 3:  GERD (ICD-530.81) doing well no sxs on meds His updated medication list for this problem includes:    Famotidine 20 Mg Tabs (Famotidine) .Marland Kitchen... Take 1 tablet by mouth twice a day as needed  EGD: DONE (07/21/2009)  Labs Reviewed: Hgb: 12.6 (08/30/2009)   Hct: 36.0 (08/30/2009)  Complete Medication List: 1)  Fluticasone  Propionate 50 Mcg/act Susp (Fluticasone propionate) .... Two sprays each nostril every day 2)  Altace 10 Mg Caps (Ramipril) .... Take 1 capsule by mouth once a day 3)  Diclofenac Sodium 75 Mg Tbec (Diclofenac sodium) .... Take 1 tablet by mouth twice a day 4)  Famotidine 20 Mg Tabs (Famotidine) .... Take 1 tablet by mouth twice a day as needed 5)  Patanol 0.1 % Soln (Olopatadine hcl) .... Apply 1 drop into both eyes twice a day 6)  Simvastatin 40 Mg Tabs (Simvastatin) .... Take 1 tablet by mouth at bedtime 7)  Toprol Xl 25 Mg Tb24 (Metoprolol succinate) .... Take 1 tablet by mouth once a day 8)  Glyburide-metformin 5-500 Mg Tabs (Glyburide-metformin) .... Take 1 tablet by mouth two times a day 9)  Freestyle Lite Test Strp (Glucose blood) .... Use once daily as directed 10)  Freestyle Unistick Ii Lancets Misc (Lancets) .... Use as directed 11)  Daily Multi Tabs (  Multiple vitamins-minerals) .... Take one by mouth once daily 12)  Fish Oil 1000 Mg Caps (Omega-3 fatty acids) .... Take one by mouth once daily 13)  Ocuvite Tabs (Multiple vitamins-minerals) .... Take two tabs by mouth once daily 14)  Ferrous Sulfate 325 (65 Fe) Mg Tabs (Ferrous sulfate) .... Take 1 tablet by mouth one  time a day  Patient Instructions: 1)  Please schedule a follow-up appointment in 4 months. 2)  labs one week prior to visit 3)  lipids---272.4 4)  lfts-995.2 5)  bmet-995.2 6)  A1C-250.02 7)

## 2010-05-17 NOTE — Progress Notes (Signed)
Summary: Questions about prep   Phone Note Call from Patient Call back at Home Phone [phone] Call back at [phone]   Caller: Patient Call For: Dr. Marina Goodell Summary of Call: Would like to know if he can have a "[facility]". Having a procedure Initial call taken by: Karna Christmas,  July 15, 2009 3:25 PM  Follow-up for Phone Call        Spoke with wife.  Told it is ok for him to have [employer] bar until the 07-20-09, day before his procedure when he is to drink clear liquids only.  Understanding voiced Follow-up by: Karl Bales RN,  July 15, 2009 5:07 PM

## 2010-05-17 NOTE — Letter (Signed)
Summary: New Patient letter  [facility]  [address] [place], [place] [address]   Phone: [phone]  Fax: [phone]       05/11/2009 MRN: [id number]  [facility] [address] [place], [place]  [address]  Dear Mr. Uram,  Welcome to the Gastroenterology Division at [facility].    You are scheduled to see Dr. Marina Goodell on 05-31-09 at 1:45p.m. on the 3rd floor at [facility], [address]  We ask that you try to arrive at our office 15 minutes prior to your appointment time to allow for check-in.  We would like you to complete the enclosed self-administered evaluation form prior to your visit and bring it with you on the day of your appointment.  We will review it with you.  Also, please bring a complete list of all your medications or, if you prefer, bring the medication bottles and we will list them.  Please bring your insurance card so that we may make a copy of it.  If your insurance requires a referral to see a specialist, please bring your referral form from your primary care physician.  Co-payments are due at the time of your visit and may be paid by cash, check or credit card.     Your office visit will consist of a consult with your physician (includes a physical exam), any laboratory testing he/she may order, scheduling of any necessary diagnostic testing (e.g. x-ray, ultrasound, CT-scan), and scheduling of a procedure (e.g. Endoscopy, Colonoscopy) if required.  Please allow enough time on your schedule to allow for any/all of these possibilities.    If you cannot keep your appointment, please call [phone] to cancel or reschedule prior to your appointment date.  This allows [place] the opportunity to schedule an appointment for another patient in need of care.  If you do not cancel or reschedule by 5 p.m. the business day prior to your appointment date, you will be charged a $50.00 late cancellation/no-show fee.    Thank you for choosing [facility] for your medical needs.  We appreciate the opportunity to care for you.  Please visit [place] at our website  to learn more about our practice.                     Sincerely,                                                             The Gastroenterology Division

## 2010-05-17 NOTE — Assessment & Plan Note (Signed)
Summary: TESTOSTERONE INJ/NJR   Nurse Visit   Allergies: No Known Drug Allergies  Medication Administration  Injection # 1:    Medication: Testosterone Cypionat 200mg  ing    Diagnosis: TESTICULAR HYPOFUNCTION (ICD-257.2)    Route: IM    Site: RUOQ gluteus    Exp Date: 06/16/2011    Lot #: [id number]    Mfr: Gaylyn Rong    Patient tolerated injection without complications    Given by: Alfred Levins, CMA (March 08, 2010 2:16 PM)  Orders Added: 1)  Admin of patients own med IM/SQ [phone]

## 2010-05-17 NOTE — Progress Notes (Signed)
Summary: Questions about prep   Phone Note Call from Patient Call back at [phone]   Caller: Patient Call For: Dr. Marina Goodell Reason for Call: Talk to Nurse Summary of Call: Pt has some questions about prep. 1. Wants to know if he can eat creamy peanut butter 2. Can he drink joint juice for his arthritis 3. Can he have sugary jello/juices since he is diabetic and cannot eat anything Initial call taken by: Karna Christmas,  July 16, 2009 12:24 PM  Follow-up for Phone Call        Called pt back and left message regarding the questions he had regarding juice, jello and peanut butter.Answered all his questions and left message to call back if he had any other problems.Ulis Rias RN  July 16, 2009 3:03 PM

## 2010-05-17 NOTE — Assessment & Plan Note (Signed)
Summary: testosterone inj/cjr   Nurse Visit   Allergies: No Known Drug Allergies  Medication Administration  Injection # 1:    Medication: Testosterone Cypionat 200mg  ing    Diagnosis: TESTICULAR HYPOFUNCTION (ICD-257.2)    Route: IM    Site: LUOQ gluteus    Exp Date: 06/16/2011    Lot #: [id number]    Mfr: paddock    Patient tolerated injection without complications    Given by: Alfred Levins, CMA (February 08, 2010 2:51 PM)  Orders Added: 1)  Testosterone Cypionat 200mg  ing [J1080] 2)  Admin of patients own med IM/SQ [[id number]]

## 2010-05-17 NOTE — Assessment & Plan Note (Signed)
Summary: CONSULT RE: LABS/CJR   Primary Care Provider:  Birdie Sons MD   History of Present Illness:  Follow-Up Visit      This is a 77 year old man who presents for Follow-up visit.  The patient denies chest pain and palpitations.  Since the last visit the patient notes no new problems or concerns (some daytime urinary frequency---minimal).  The patient reports taking meds as prescribed.  When questioned about possible medication side effects, the patient notes none.    All other systems reviewed and were negative   Allergies: No Known Drug Allergies  Past History:  Past Medical History: Last updated: 2009/06/20 Coronary artery disease. S/P inferior wall MI 2004 with PTCA/Stent; s/p CABG 2004 Diabetes mellitus, type II GERD Hyperlipidemia Myocardial infarction, hx of Anemia  Past Surgical History: Last updated: 2009-06-20 Coronary artery bypass graft 2004 (LIMA to LAD; SVG to ramus intermedius; SVG to PDA/PLSA Tonsillectomy  Family History: Last updated: 06/20/09 father died at age 90 --hx CVA, hypertension mother died age 69 some form cirrhosis,not clear of etiology Blood transfusions aunt died with cirrohisis related complications  age 64 blood transfusions two sisters--one with breast cancer age 15 paternal grandparents died in mid 35's of stroke maternal grandparents--grandmotherdied of stroke age 69, grandfather died of MI age 67 No FH of Colon Cancer:  Social History: Last updated: 06/20/2009 Occupation:-financial Former Smoker pipe Alcohol use-yes Regular exercise-no Daily Caffeine Use coffee 3 cups a day Illicit Drug Use - no  Risk Factors: Exercise: no (04/26/2007)  Risk Factors: Smoking Status: quit (09/09/2009)   Impression & Recommendations:  Problem # 1:  TESTICULAR HYPOFUNCTION (ICD-257.2) discussed at length--total time 20 minutes all FTF counseling testosterone injection.  side effects discussed  Complete Medication List: 1)   Fluticasone Propionate 50 Mcg/act Susp (Fluticasone propionate) .... Two sprays each nostril every day 2)  Altace 10 Mg Caps (Ramipril) .... Take 1 capsule by mouth once a day 3)  Diclofenac Sodium 75 Mg Tbec (Diclofenac sodium) .... Take 1 tablet by mouth twice a day 4)  Famotidine 20 Mg Tabs (Famotidine) .... Take 1 tablet by mouth twice a day as needed 5)  Patanol 0.1 % Soln (Olopatadine hcl) .... Apply 1 drop into both eyes twice a day 6)  Simvastatin 40 Mg Tabs (Simvastatin) .... Take 1 tablet by mouth at bedtime 7)  Toprol Xl 25 Mg Tb24 (Metoprolol succinate) .... Take 1 tablet by mouth once a day 8)  Glyburide-metformin 5-500 Mg Tabs (Glyburide-metformin) .... Take 1 tablet by mouth two times a day 9)  Freestyle Lite Test Strp (Glucose blood) .... Use once daily as directed 10)  Freestyle Unistick Ii Lancets Misc (Lancets) .... Use as directed 11)  Daily Multi Tabs (Multiple vitamins-minerals) .... Take one by mouth once daily 12)  Fish Oil 1000 Mg Caps (Omega-3 fatty acids) .... Take one by mouth once daily 13)  Ocuvite Tabs (Multiple vitamins-minerals) .... Take two tabs by mouth once daily 14)  Ferrous Sulfate 325 (65 Fe) Mg Tabs (Ferrous sulfate) .... Take 1 tablet by mouth one  time a day 15)  Amoxicillin 500 Mg Caps (Amoxicillin) .... One by mouth two times a day for 10 days 16)  Depo-testosterone 200 Mg/ml Oil (Testosterone cypionate) .Marland Kitchen.. 1 cc intramuscularly monthly Prescriptions: DEPO-TESTOSTERONE 200 MG/ML OIL (TESTOSTERONE CYPIONATE) 1 cc intramuscularly monthly  #10 cc x 1   Entered and Authorized by:   Birdie Sons MD   Signed by:   Birdie Sons MD on 02/04/2010  Method used:   Print then Give to Patient   RxID:   [id number]    Orders Added: 1)  Est. Patient Level III [14782]

## 2010-05-17 NOTE — Procedures (Signed)
Summary: Colonoscopy  Patient: Steven [name] Note: All result statuses are Final unless otherwise noted.  Tests: (1) Colonoscopy (COL)   COL Colonoscopy           DONE     [facility]     [address]     [place], [place]  [address]           COLONOSCOPY PROCEDURE REPORT           PATIENT:  Steven, [name]  MR#:  [id number]     BIRTHDATE:  1930-08-05, 78 yrs. old  GENDER:  male     ENDOSCOPIST:  Wilhemina Bonito. Eda Keys, MD     REF. BY:  Office - Dr. Birdie Sons     PROCEDURE DATE:  07/21/2009     PROCEDURE:  Colonoscopy with snare polypectomy     ASA CLASS:  Class II     INDICATIONS:  Iron Deficiency Anemia     MEDICATIONS:   Fentanyl 75 mcg IV, Versed 9 mg IV           DESCRIPTION OF PROCEDURE:   After the risks benefits and     alternatives of the procedure were thoroughly explained, informed     consent was obtained.  Digital rectal exam was performed and     revealed no abnormalities.   The LB [id number] endoscope     was introduced through the anus and advanced to the cecum, which     was identified by both the appendix and ileocecal valve, without     limitations. Time to cecum = 4:13 min.The quality of the prep was     excellent, using MoviPrep.  The instrument was then slowly     withdrawn (time = 15:56 min)as the colon was fully examined.     <<PROCEDUREIMAGES>>           FINDINGS:  The terminal ileum appeared normal.  A diminutive polyp     was found in the sigmoid colon. Polyp was snared without cautery.     Retrieval was successful.   Moderate diverticulosis was found in     the sigmoid colon.  This was otherwise a normal examination of the     colon.   Retroflexed views in the rectum revealed internal     hemorrhoids.    The scope was then withdrawn from the patient and     the procedure completed.           COMPLICATIONS:  None     ENDOSCOPIC IMPRESSION:     1) Normal terminal ileum     2) Diminutive polyp in the sigmoid colon -removed     3)  Moderate diverticulosis in the sigmoid colon     4) Otherwise normal examination     5) Internal hemorrhoids           RECOMMENDATIONS:     1) Repeat colonoscopy in 5 years if polyp adenomatous; otherwise     10 years     2) EGD today     ______________________________     Wilhemina Bonito. Eda Keys, MD           CC:  Lindley Magnus, MD; The Patient           n.     eSIGNED:   Wilhemina Bonito. Eda Keys at 07/21/2009 11:42 AM           Teresa Pelton, [id number]  Note: An exclamation mark (!) indicates  a result that was not dispersed into the flowsheet. Document Creation Date: 07/21/2009 11:43 AM _______________________________________________________________________  (1) Order result status: Final Collection or observation date-time: 07/21/2009 11:34 Requested date-time:  Receipt date-time:  Reported date-time:  Referring Physician:   Ordering Physician: Fransico Setters [phone]) Specimen Source:  Source: Launa Grill Order Number: [id number] Lab site:   Appended Document: Colonoscopy recall in 5 yrs/07-2014     Procedures Next Due Date:    Colonoscopy: 07/2014

## 2010-05-17 NOTE — Assessment & Plan Note (Signed)
Summary: ANEMIA--CH.    History of Present Illness Visit Type: Initial Consult Primary GI MD: Yancey Flemings MD Primary Provider: Birdie Sons MD Requesting Provider: Birdie Sons MD Chief Complaint: Patient has had chonic anemia since 1960. He states that after 2-3 hours of working in the yard he feels tired and would like to not feel tired. He in general he does not feel fatigued.  History of Present Illness:   77 year old white male with multiple medical problems including coronary artery disease status post coronary artery bypass grafting, type 2 diabetes mellitus, hyperlipidemia, and GERD. He is sent today by Dr. Cato Mulligan regarding anemia. The patient underwent blood work on May 03, 2009 (reviewed). He was found to have a mild anemia with a hemoglobin of 12.6. The MCV was normal at 94.4. These numbers are not significantly different from December 2008 at which time his hemoglobin was 12.7 and MCV 95.7. He has not had iron studies or Hemoccults performed. He did undergo routine screening colonoscopy in January of 2005. The examination was normal except for mild sigmoid diverticulosis. There is no family history of colon cancer. His GI review of systems is entirely negative. He does take famotidine for GERD. This helps. No dysphagia or weight loss. No melena or hematochezia.   GI Review of Systems    Reports acid reflux.      Denies abdominal pain, belching, bloating, chest pain, dysphagia with liquids, dysphagia with solids, heartburn, loss of appetite, nausea, vomiting, vomiting blood, weight loss, and  weight gain.        Denies anal fissure, black tarry stools, change in bowel habit, constipation, diarrhea, diverticulosis, fecal incontinence, heme positive stool, hemorrhoids, irritable bowel syndrome, jaundice, light color stool, liver problems, rectal bleeding, and  rectal pain. Preventive Screening-Counseling & Management      Drug Use:  no.      Current Medications (verified): 1)   Fluticasone Propionate 50 Mcg/act  Susp (Fluticasone Propionate) .... One Spray Each Nostril Every Day 2)  Altace 10 Mg Caps (Ramipril) .... Take 1 Capsule By Mouth Once A Day 3)  Diclofenac Sodium 75 Mg Tbec (Diclofenac Sodium) .... Take 1 Tablet By Mouth Twice A Day 4)  Famotidine 20 Mg Tabs (Famotidine) .... Take 1 Tablet By Mouth Twice A Day As Needed 5)  Patanol 0.1 % Soln (Olopatadine Hcl) .... Apply 1 Drop Into Both Eyes Twice A Day 6)  Simvastatin 40 Mg Tabs (Simvastatin) .... Take 1 Tablet By Mouth At Bedtime 7)  Toprol Xl 25 Mg Tb24 (Metoprolol Succinate) .... Take 1 Tablet By Mouth Once A Day 8)  Glyburide-Metformin 5-500 Mg Tabs (Glyburide-Metformin) .... Take 1 Tablet By Mouth Two Times A Day 9)  Freestyle Lite Test   Strp (Glucose Blood) .... Use Once Daily As Directed 10)  Freestyle Unistick Ii Lancets   Misc (Lancets) .... Use As Directed 11)  Daily Multi  Tabs (Multiple Vitamins-Minerals) .... Take One By Mouth Once Daily 12)  Fish Oil 1000 Mg Caps (Omega-3 Fatty Acids) .... Take One By Mouth Once Daily 13)  Ocuvite  Tabs (Multiple Vitamins-Minerals) .... Take Two Tabs By Mouth Once Daily  Allergies (verified): 1)  ! Codeine Sulfate (Codeine Sulfate)  Past History:  Past Medical History: Coronary artery disease. S/P inferior wall MI 2004 with PTCA/Stent; s/p CABG 2004 Diabetes mellitus, type II GERD Hyperlipidemia Myocardial infarction, hx of Anemia  Past Surgical History: Coronary artery bypass graft 2004 (LIMA to LAD; SVG to ramus intermedius; SVG to PDA/PLSA Tonsillectomy  Family  History: father died at age 71 --hx CVA, hypertension mother died age 42 some form cirrhosis,not clear of etiology Blood transfusions aunt died with cirrohisis related complications  age 24 blood transfusions two sisters--one with breast cancer age 68 paternal grandparents died in mid 47's of stroke maternal grandparents--grandmotherdied of stroke age 79, grandfather died of MI age  31 No FH of Colon Cancer:  Social History: Occupation:-financial Former Smoker pipe Alcohol use-yes Regular exercise-no Daily Caffeine Use coffee 3 cups a day Illicit Drug Use - no Drug Use:  no  Review of Systems       The patient complains of allergy/sinus.  The patient denies anemia, anxiety-new, arthritis/joint pain, back pain, blood in urine, breast changes/lumps, change in vision, confusion, cough, coughing up blood, depression-new, fainting, fatigue, fever, headaches-new, hearing problems, heart murmur, heart rhythm changes, itching, menstrual pain, muscle pains/cramps, night sweats, nosebleeds, pregnancy symptoms, shortness of breath, skin rash, sleeping problems, sore throat, swelling of feet/legs, swollen lymph glands, thirst - excessive , urination - excessive , urination changes/pain, urine leakage, vision changes, and voice change.    Vital Signs:  Patient profile:   77 year old male Height:      66 inches Weight:      188.8 pounds BMI:     30.58 Pulse rate:   64 / minute Pulse rhythm:   regular BP sitting:   112 / 62  (left arm) Cuff size:   regular  Vitals Entered By: Harlow Mares CMA Duncan Dull) (May 31, 2009 2:01 PM)  Physical Exam  General:  Well developed, well nourished, no acute distress. Head:  Normocephalic and atraumatic. Eyes:  PERRLA, no icterus. Nose:  No deformity, discharge,  or lesions. Mouth:  No deformity or lesions, Neck:  Supple; no masses or thyromegaly. Lungs:  Clear throughout to auscultation. Heart:  Regular rate and rhythm; no murmurs, rubs,  or bruits. Abdomen:  Soft, nontender and nondistended. No masses, hepatosplenomegaly or hernias noted. Normal bowel sounds. Msk:  Symmetrical with no gross deformities. Normal posture. Pulses:  Normal pulses noted. Extremities:  No clubbing, cyanosis, edema or deformities noted. Neurologic:  Alert and  oriented x4;  grossly normal neurologically. Skin:  Intact without significant lesions or  rashes. Psych:  Alert and cooperative. Normal mood and affect.   Impression & Recommendations:  Problem # 1:  ANEMIA, OTHER UNSPEC (ICD-47.49) 77 year old gentleman with multiple medical problems who sent regarding chronic stable mild normocytic anemia. It is not clear that this is related to an underlying GI problem. Negative GI review of systems currently and negative colonoscopy previously, as described.  Plan: #1. Anemia panel #2. Hemoccult cards x6 #3. If there is no evidence for iron deficiency and Hemoccult studies are negative, then return to Dr. Cato Mulligan as no further GI workup indicated. He may wish to refer the patient to a hematologist #4. If anemia panel reveals our deficiency and/or Hemoccult cards are positive, then schedule colonoscopy and upper endoscopy. I discussed this with the patient  Problem # 2:  GERD (ICD-530.81) mild GERD managed with famotidine p.r.n. Continue famotidine as needed.  Other Orders: TLB-IBC Pnl (Iron/FE;Transferrin) (83550-IBC) TLB-Ferritin (82728-FER) TLB-B12, Serum-Total ONLY (16109-U04) TLB-Folic Acid (Folate) (82746-FOL) [facility] Hemoccult Cards #6 (take home) (Hem Cards #6)  Patient Instructions: 1)  Hemoccult cards #6 given to patient to return within 2 weeks to lab. 2)  Labs ordered for patient to go to basement today and get drawn. 3)  The medication list was reviewed and reconciled.  All changed /  newly prescribed medications were explained.  A complete medication list was provided to the patient / caregiver. 4)  copy: Dr. Birdie Sons

## 2010-05-17 NOTE — Assessment & Plan Note (Signed)
Summary: PER CHECK OUT/SF      Allergies Added:   Visit Type:  Follow-up Primary Provider:  Birdie Sons MD  CC:  no complaints.  History of Present Illness: Steven [name] is a 77 year old with a histor of CAD (s/p IWMI with PTCA/stent to RCA and CABG in 2004).  He was previously followed by B. Downey.  I last saw him approximately 1 year ago. since seen, he denies chest pain.  Breathing is fair.  He does say he gives out if he does too much but notes no change recently  has some arthrits in knees.  Notes over the past few days swelling in R >L leg   Current Medications (verified): 1)  Fluticasone Propionate 50 Mcg/act  Susp (Fluticasone Propionate) .... One Spray Each Nostril Every Day 2)  Altace 10 Mg Caps (Ramipril) .... Take 1 Capsule By Mouth Once A Day 3)  Diclofenac Sodium 75 Mg Tbec (Diclofenac Sodium) .... Take 1 Tablet By Mouth Twice A Day 4)  Famotidine 20 Mg Tabs (Famotidine) .... Take 1 Tablet By Mouth Twice A Day As Needed 5)  Patanol 0.1 % Soln (Olopatadine Hcl) .... Apply 1 Drop Into Both Eyes Twice A Day 6)  Simvastatin 40 Mg Tabs (Simvastatin) .... Take 1 Tablet By Mouth At Bedtime 7)  Toprol Xl 25 Mg Tb24 (Metoprolol Succinate) .... Take 1 Tablet By Mouth Once A Day 8)  Glyburide-Metformin 5-500 Mg Tabs (Glyburide-Metformin) .... Take 1 Tablet By Mouth Two Times A Day 9)  Freestyle Lite Test   Strp (Glucose Blood) .... Use Once Daily As Directed 10)  Freestyle Unistick Ii Lancets   Misc (Lancets) .... Use As Directed  Allergies (verified): 1)  ! Codeine Sulfate (Codeine Sulfate)  Past History:  Past Medical History: Last updated: 04/20/2008 Coronary artery disease. S/P inferior wall MI 2004 with PTCA/Stent; s/p CABG 2004 Diabetes mellitus, type II GERD Hyperlipidemia Myocardial infarction, hx of  Past Surgical History: Last updated: 04/20/2008 Coronary artery bypass graft 2004 (LIMA to LAD; SVG to ramus intermedius; SVG to PDA/PLSA  Family History: Last  updated: 2007-04-27 father died at age 53 --hx CVA, hypertension mother died age 25 some form cirrhosis,not clear of etiology aunt died with cirrohisis related complications  age 75 two sisters--one with breast cancer age 17 paternal grandparents died in mid 33's of stroke maternal grandparents--grandmotherdied of stroke age 85, grandfather died of MI age 71  Social History: Last updated: 04/26/2007 Occupation:-financial Former Smoker Alcohol use-yes Regular exercise-no  Review of Systems       All systems reviewed.  Negative to the above problem except as noted above.  Vital Signs:  Patient profile:   77 year old male Height:      66 inches Weight:      188 pounds Pulse rate:   62 / minute BP sitting:   106 / 64  (left arm) Cuff size:   regular  Vitals Entered By: Kem Parkinson (May 21, 2009 2:25 PM)  Physical Exam  Additional Exam:  HEENT:  Normocephalic, atraumatic. EOMI, PERRLA.  Neck: JVP is normal. No thyromegaly. No bruits.  Lungs: clear to auscultation. No rales no wheezes.  Heart: Regular rate and rhythm. Normal S1, S2. No S3.   No significant murmurs. PMI not displaced.  Abdomen:  Supple, nontender. Normal bowel sounds. No masses. No hepatomegaly.  Extremities:   Good distal pulses throughout. Tr to 1+ edema R leg (scar from vein harvest) Musculoskeletal :moving all extremities. Neuro:  alert and oriented x3.    EKG  Procedure date:  05/21/2009  Findings:      NSR.  60 bpm.  Possible IWMI  Impression & Recommendations:  Problem # 1:  CAD, ARTERY BYPASS GRAFT (ICD-414.04) Patient seems to be doing well.  I would keep him on ihs same regimen.  Need to confirm ASA.  Problem # 2:  HYPERLIPIDEMIA (ICD-272.4) His LDL is a little higher than I like.  At times it has been near 90, other times above 100 He will check on copay of Crestor.  I would favor switching to this.  He will call back. His updated medication list for this problem includes:     Simvastatin 40 Mg Tabs (Simvastatin) .Marland Kitchen... Take 1 tablet by mouth at bedtime  Problem # 3:  CAROTID ARTERY STENOSIS, WITHOUT INFARCTION (ICD-433.10) Will need to review.  Other Orders: EKG w/ Interpretation (93000)

## 2010-05-17 NOTE — Assessment & Plan Note (Signed)
Summary: fu per doc/njr   Vital Signs:  Patient profile:   77 year old male Weight:      180 pounds BMI:     29.16 Temp:     97.9 degrees F oral Pulse rate:   72 / minute Pulse rhythm:   regular Resp:     12 per minute BP sitting:   112 / 60  (left arm) Cuff size:   regular  Vitals Entered By: Gladis Riffle, RN (September 29, 2009 9:30 AM) CC: FU per Dr Tawanna Cooler for asthma and virus--has completed medications and feeling better Is Patient Diabetic? Yes Did you bring your meter with you today? No Comments has not checked CBG   Primary Care Provider:  Birdie Sons MD  CC:  FU per Dr Tawanna Cooler for asthma and virus--has completed medications and feeling better.  History of Present Illness:  Follow-Up Visit      This is a 77 year old man who presents for Follow-up visit.  The patient denies chest pain, palpitations, and SOB.  Since the last visit the patient notes no new problems or concerns, except for "asthma"---he is much better, tolerated meds well.  The patient reports taking meds as prescribed.  When questioned about possible medication side effects, the patient notes none.    All other systems reviewed and were negative   Current Medications (verified): 1)  Fluticasone Propionate 50 Mcg/act  Susp (Fluticasone Propionate) .... Two Sprays Each Nostril Every Day 2)  Altace 10 Mg Caps (Ramipril) .... Take 1 Capsule By Mouth Once A Day 3)  Diclofenac Sodium 75 Mg Tbec (Diclofenac Sodium) .... Take 1 Tablet By Mouth Twice A Day 4)  Famotidine 20 Mg Tabs (Famotidine) .... Take 1 Tablet By Mouth Twice A Day As Needed 5)  Patanol 0.1 % Soln (Olopatadine Hcl) .... Apply 1 Drop Into Both Eyes Twice A Day 6)  Simvastatin 40 Mg Tabs (Simvastatin) .... Take 1 Tablet By Mouth At Bedtime 7)  Toprol Xl 25 Mg Tb24 (Metoprolol Succinate) .... Take 1 Tablet By Mouth Once A Day 8)  Glyburide-Metformin 5-500 Mg Tabs (Glyburide-Metformin) .... Take 1 Tablet By Mouth Two Times A Day 9)  Freestyle Lite Test   Strp  (Glucose Blood) .... Use Once Daily As Directed 10)  Freestyle Unistick Ii Lancets   Misc (Lancets) .... Use As Directed 11)  Daily Multi  Tabs (Multiple Vitamins-Minerals) .... Take One By Mouth Once Daily 12)  Fish Oil 1000 Mg Caps (Omega-3 Fatty Acids) .... Take One By Mouth Once Daily 13)  Ocuvite  Tabs (Multiple Vitamins-Minerals) .... Take Two Tabs By Mouth Once Daily 14)  Ferrous Sulfate 325 (65 Fe) Mg Tabs (Ferrous Sulfate) .... Take 1 Tablet By Mouth One  Time A Day  Allergies (verified): No Known Drug Allergies  Physical Exam  General:  alert and well-developed.   Head:  normocephalic and atraumatic.   Neck:  No deformities, masses, or tenderness noted. Lungs:  cta, no wheeze Skin:  foreign body---right finger---present for 2 weeks  area prepped and draped incised   Impression & Recommendations:  Problem # 1:  ASTHMA, ACUTE (ICD-493.92) improved The following medications were removed from the medication list:    Prednisone 20 Mg Tabs (Prednisone) ..... Uad  Problem # 2:  FOREIGN BODY, FINGER (ICD-915.6)  removed incised brown foreign body approx 3 mm  Orders: Incision and Removal of Foreign Body, subq tissues; simple (10120)  Complete Medication List: 1)  Fluticasone Propionate 50 Mcg/act Susp (Fluticasone propionate) .Marland KitchenMarland KitchenMarland Kitchen  Two sprays each nostril every day 2)  Altace 10 Mg Caps (Ramipril) .... Take 1 capsule by mouth once a day 3)  Diclofenac Sodium 75 Mg Tbec (Diclofenac sodium) .... Take 1 tablet by mouth twice a day 4)  Famotidine 20 Mg Tabs (Famotidine) .... Take 1 tablet by mouth twice a day as needed 5)  Patanol 0.1 % Soln (Olopatadine hcl) .... Apply 1 drop into both eyes twice a day 6)  Simvastatin 40 Mg Tabs (Simvastatin) .... Take 1 tablet by mouth at bedtime 7)  Toprol Xl 25 Mg Tb24 (Metoprolol succinate) .... Take 1 tablet by mouth once a day 8)  Glyburide-metformin 5-500 Mg Tabs (Glyburide-metformin) .... Take 1 tablet by mouth two times a day 9)   Freestyle Lite Test Strp (Glucose blood) .... Use once daily as directed 10)  Freestyle Unistick Ii Lancets Misc (Lancets) .... Use as directed 11)  Daily Multi Tabs (Multiple vitamins-minerals) .... Take one by mouth once daily 12)  Fish Oil 1000 Mg Caps (Omega-3 fatty acids) .... Take one by mouth once daily 13)  Ocuvite Tabs (Multiple vitamins-minerals) .... Take two tabs by mouth once daily 14)  Ferrous Sulfate 325 (65 Fe) Mg Tabs (Ferrous sulfate) .... Take 1 tablet by mouth one  time a day Prescriptions: FLUTICASONE PROPIONATE 50 MCG/ACT  SUSP (FLUTICASONE PROPIONATE) two sprays each nostril every day  #2 x 3   Entered by:   Gladis Riffle, RN   Authorized by:   Birdie Sons MD   Signed by:   Gladis Riffle, RN on 09/29/2009   Method used:   Electronically to        [facility] [phone]* (retail)       [address]       [place], [place]  [address]       Ph: [phone] or [phone]       Fax: [phone]   RxID:   [id number]

## 2010-05-17 NOTE — Assessment & Plan Note (Signed)
Summary: flu shot/njr   Nurse Visit   Allergies: No Known Drug Allergies  Immunizations Administered:  Influenza Vaccine # 1:    Vaccine Type: Fluvax MCR    Site: left deltoid    Mfr: [employer]    Dose: 0.5 ml    Route: IM    Given by: Kathrynn Speed CMA    Exp. Date: 10/15/2010    Lot #: [id number]    VIS given: 11/09/09 version given January 18, 2010.  Flu Vaccine Consent Questions:    Do you have a history of severe allergic reactions to this vaccine? no    Any prior history of allergic reactions to egg and/or gelatin? no    Do you have a sensitivity to the preservative Thimersol? no    Do you have a past history of Guillan-Barre Syndrome? no    Do you currently have an acute febrile illness? no    Have you ever had a severe reaction to latex? no    Vaccine information given and explained to patient? yes  Orders Added: 1)  Influenza Vaccine MCR [00025]

## 2010-05-17 NOTE — Miscellaneous (Signed)
Summary: Orders Update  Clinical Lists Changes  Orders: Added new Test order of Carotid Duplex (Carotid Duplex) - Signed 

## 2010-05-17 NOTE — Progress Notes (Signed)
Summary: Request abx, lab results  Phone Note Refill Request Call back at [phone] Ext [phone]  ok to leave message Message from:  Patient on January 26, 2010 8:12 AM  Pt states he is having nasal congestion and has to get Amoxicillin to clear it up. Pt states that he saw [facility] for the rx in the past and they told him he could just get refills from [place]?.  Pt would also like lab results from 01/18/10.  Please advise.   Next Appointment Scheduled: 05/16/10  Dr Cato Mulligan Initial call taken by: Mervin Kung CMA Duncan Dull),  January 26, 2010 8:17 AM  Follow-up for Phone Call        cp. amoxil 500 mg by mouth two times a day for 10 days Follow-up by: Birdie Sons MD,  January 27, 2010 4:11 PM  Additional Follow-up for Phone Call Additional follow up Details #1::        Rx faxed to pharmacy.  Patient aware. Additional Follow-up by: Rudy Jew, RN,  January 28, 2010 9:34 AM    New/Updated Medications: AMOXICILLIN 500 MG CAPS (AMOXICILLIN) One by mouth two times a day for 10 days Prescriptions: AMOXICILLIN 500 MG CAPS (AMOXICILLIN) One by mouth two times a day for 10 days  #20 x 0   Entered by:   Rudy Jew, RN   Authorized by:   Birdie Sons MD   Signed by:   Rudy Jew, RN on 01/28/2010   Method used:   [phone][facility] ([facility]ail[facility]       [address]       [place], [place]  [address]       Ph: [phone] or [phone]       Fax: [phone]   RxID:   [id number]

## 2010-05-17 NOTE — Progress Notes (Signed)
   Phone Note Outgoing Call   Call placed by: Marquet Faircloth [place],  June 03, 2009 2:28 PM Call placed to: Patient Summary of Call: Called patient to remind him to send in his hemoccult cards.  Left msg for him to call me with status.   Initial call taken by: Milford Cage NCMA,  June 03, 2009 2:31 PM  Follow-up for Phone Call        cALLED PATIENT AND HE STATED HE IS NOT GOING TO TURN IN HEMOCCULT CARDS AND WILL WAIT TO HAVE COLON/ENDO THAT HE IS SCHEDULED FOR.   Follow-up by: Eric Nees NCMA,  June 16, 2009 8:56 AM

## 2010-05-17 NOTE — Progress Notes (Signed)
   Phone Note Outgoing Call   Summary of Call: Message left for pt. to call me to schedule post EGD f/u visit as pt. will need CBCD few days prior to visit.  Follow-up for Phone Call        Pt. scheduled for rov on 09/07/09 and will come for labs the week prior.Asking if Dr.Perry will include his HGB a1c which he said he will but pt. will need to discuss results wit Dr.Swords. Follow-up by: Teryl Lucy RN,  August 11, 2009 10:58 AM

## 2010-05-17 NOTE — Letter (Signed)
Summary: [facility] Instructions  [facility]  [address] [place], [place] [address]   Phone: [phone]  Fax: [phone]       Steven [name]    [birth year]    MRN: [id number]        Procedure Day Dorna Bloom:  Wednesday 07/21/2009     Arrival Time: 9:30 am      Procedure Time: 10:30 am     Location of Procedure:                    _x _  [facility] (4th Floor)                        PREPARATION FOR COLONOSCOPY WITH MOVIPREP   Starting 5 days prior to your procedure Friday 4/1 do not eat nuts, seeds, popcorn, corn, beans, peas,  salads, or any raw vegetables.  Do not take any fiber supplements (e.g. Metamucil, Citrucel, and Benefiber).  THE DAY BEFORE YOUR PROCEDURE         DATE: Tuesday 4/5  1.  Drink clear liquids the entire day-NO SOLID FOOD  2.  Do not drink anything colored red or purple.  Avoid juices with pulp.  No orange juice.  3.  Drink at least 64 oz. (8 glasses) of fluid/clear liquids during the day to prevent dehydration and help the prep work efficiently.  CLEAR LIQUIDS INCLUDE: Water Jello Ice Popsicles Tea (sugar ok, no milk/cream) Powdered fruit flavored drinks Coffee (sugar ok, no milk/cream) Gatorade Juice: apple, white grape, white cranberry  Lemonade Clear bullion, consomm, broth Carbonated beverages (any kind) Strained chicken noodle soup Hard Candy                             4.  In the morning, mix first dose of MoviPrep solution:    Empty 1 Pouch A and 1 Pouch B into the disposable container    Add lukewarm drinking water to the top line of the container. Mix to dissolve    Refrigerate (mixed solution should be used within 24 hrs)  5.  Begin drinking the prep at 5:00 p.m. The MoviPrep container is divided by 4 marks.   Every 15 minutes drink the solution down to the next mark (approximately 8 oz) until the full liter is complete.   6.  Follow completed prep with 16 oz of clear liquid of your choice (Nothing  red or purple).  Continue to drink clear liquids until bedtime.  7.  Before going to bed, mix second dose of MoviPrep solution:    Empty 1 Pouch A and 1 Pouch B into the disposable container    Add lukewarm drinking water to the top line of the container. Mix to dissolve    Refrigerate  THE DAY OF YOUR PROCEDURE      DATE: Wednesday 4/6  Beginning at 5:30 am (5 hours before procedure):         1. Every 15 minutes, drink the solution down to the next mark (approx 8 oz) until the full liter is complete.  2. Follow completed prep with 16 oz. of clear liquid of your choice.    3. You may drink clear liquids until 8:30 am (2 HOURS BEFORE PROCEDURE).   MEDICATION INSTRUCTIONS  Unless otherwise instructed, you should take regular prescription medications with a small sip of water   as early as possible the morning of  your procedure.  Diabetic patients - see separate instructions.             OTHER INSTRUCTIONS  You will need a responsible adult at least 75 years of age to accompany you and drive you home.   This person must remain in the waiting room during your procedure.  Wear loose fitting clothing that is easily removed.  Leave jewelry and other valuables at home.  However, you may wish to bring a book to read or  an iPod/MP3 player to listen to music as you wait for your procedure to start.  Remove all body piercing jewelry and leave at home.  Total time from sign-in until discharge is approximately 2-3 hours.  You should go home directly after your procedure and rest.  You can resume normal activities the  day after your procedure.  The day of your procedure you should not:   Drive   Make legal decisions   Operate machinery   Drink alcohol   Return to work  You will receive specific instructions about eating, activities and medications before you leave.    The above instructions have been reviewed and explained to me by  Ezra Sites RN  July 14, 2009 11:15 AM     I fully understand and can verbalize these instructions _____________________________ Date _________

## 2010-05-17 NOTE — Procedures (Signed)
Summary: Upper Endoscopy  Patient: Steven [name] Note: All result statuses are Final unless otherwise noted.  Tests: (1) Upper Endoscopy (EGD)   EGD Upper Endoscopy       DONE     [facility]     [address]     [place], [place]  [address]           ENDOSCOPY PROCEDURE REPORT           PATIENT:  [name], Steven  MR#:  [id number]     BIRTHDATE:  Sep 03, 1930, 78 yrs. old  GENDER:  male           ENDOSCOPIST:  Wilhemina Bonito. Eda Keys, MD     Referred by:  Office - Dr. Cato Mulligan           PROCEDURE DATE:  07/21/2009     PROCEDURE:  EGD with biopsy     ASA CLASS:  Class II     INDICATIONS:  iron deficiency anemia           MEDICATIONS:   There was residual sedation effect present from     prior procedure., Fentanyl 25 mcg IV, Versed 1 mg IV     TOPICAL ANESTHETIC:  Exactacain Spray           DESCRIPTION OF PROCEDURE:   After the risks benefits and     alternatives of the procedure were thoroughly explained, informed     consent was obtained.  The LB GIF-H180 [id number] endoscope was     introduced through the mouth and advanced to the second portion of     the duodenum, without limitations.  The instrument was slowly     withdrawn as the mucosa was fully examined.     <<PROCEDUREIMAGES>>           The upper, middle, and distal third of the esophagus were     carefully inspected and no abnormalities were noted. The z-line     was well seen at the GEJ. The endoscope was pushed into the fundus     which was normal including a retroflexed view. The fundus and body     revealed multiple , <44mm, fundic gland type polyps (bx).The     antrum, and second part of the duodenum were unremarkable. The     bulb revealed nonerosive duodenitis (CLO bx taken).   Retroflexed     views revealed no abnormalities.    The scope was then withdrawn     from the patient and the procedure completed.           COMPLICATIONS:  None           ENDOSCOPIC IMPRESSION:     1) Pedunculated polyps in the  body/fundus of the stomach     2) Mild nonerosive Duodenitis in the bulb of duodenum     3) Otherwise normal examination     RECOMMENDATIONS:     1) Follow up biopsy results     2) FeSO4 325mg ; #60; one PO bid; 6refills     3) OFFICE FOLLOW UP WITH DR Marina Goodell IN 6 WEEKS. PLEASE HAVE YOUR     BLOOD COUNTS CHECKED A FEW DAYS PRIOR TO THE VISIT (CBC ONLY)           ______________________________     Wilhemina Bonito. Eda Keys, MD           CC:  Lindley Magnus, MD, The Patient  n.     eSIGNED:   Wilhemina Bonito. Eda Keys at 07/21/2009 11:59 AM           Teresa Pelton, [id number]  Note: An exclamation mark (!) indicates a result that was not dispersed into the flowsheet. Document Creation Date: 07/21/2009 12:05 PM _______________________________________________________________________  (1) Order result status: Final Collection or observation date-time: 07/21/2009 11:48 Requested date-time:  Receipt date-time:  Reported date-time:  Referring Physician:   Ordering Physician: Fransico Setters [phone]) Specimen Source:  Source: Launa Grill Order Number: [id number] Lab site:

## 2010-05-17 NOTE — Miscellaneous (Signed)
Summary: [facility]  Clinical Lists Changes  Medications: Added new medication of FERROUS SULFATE 325 (65 FE) MG TABS (FERROUS SULFATE) Take 1 tablet by mouth  two times a day - Signed Rx of FERROUS SULFATE 325 (65 FE) MG TABS (FERROUS SULFATE) Take 1 tablet by mouth  two times a day;  #60 x 6;  Signed;  Entered by: Jennye Boroughs RN;  Authorized by: Hilarie Fredrickson MD;  Method used: [facility] [phone]*, [address] [place], [place]  [address], Ph: [phone] or [phone], Fax: [phone]    Prescriptions: FERROUS SULFATE 325 (65 FE) MG TABS (FERROUS SULFATE) Take 1 tablet by mouth  two times a day  #60 x 6   Entered by:   Jennye Boroughs RN   Authorized by:   Hilarie Fredrickson MD   Signed by:   Jennye Boroughs RN on 07/21/2009   Method used:   [facility] to        [facility] [phone]* (retail)       [address]       [place], [place]  [address]       Ph: [phone] or [phone]       Fax: [phone]   RxID:   [id number]

## 2010-05-17 NOTE — Progress Notes (Signed)
Summary: REQ FOR RX  Phone Note Call from Patient   Caller: Patient  [phone] Reason for Call: Acute Illness Summary of Call: Pt called to adv that [facility] [facility] telling [facility] that they never received any Rx for this pt (Amoxicillin).Marland KitchenMarland KitchenMarland KitchenMarland Kitchen would like someone to call [facility] to clarify or re-send Rx.   Follow-up for Phone Call        do you kn ow anything about t his?  were we to call in or are t hey asking for refill?when I called number I get fax ringer Follow-up by: Willy Eddy, LPN,  January 31, 2010 4:41 PM  Additional Follow-up for Phone Call Additional follow up Details #1::        Pt says to pls call this script in to [facility] [address] phone [phone]  Additional Follow-up by: Lucy [place],  January 31, 2010 4:59 PM    Additional Follow-up for Phone Call Additional follow up Details #2::    see phone note 10/12 Follow-up by: Birdie Sons MD,  February 01, 2010 8:53 AM  Prescriptions: AMOXICILLIN 500 MG CAPS (AMOXICILLIN) One by mouth two times a day for 10 days  #20 x 0   Entered by:   Lynann Beaver CMA   Authorized by:   Birdie Sons MD   Signed by:   Lynann Beaver CMA on 02/01/2010   Method used:   [facility]ronically to        [facility] [phone]* (retail)       [address]       [place], [place]  [address]       Ph: [phone] or [phone]       Fax: [phone]   RxID:   [id number]

## 2010-05-17 NOTE — Miscellaneous (Signed)
Summary: [facility]  Clinical Lists Changes  Medications: Added new medication of MOVIPREP 100 GM  SOLR (PEG-KCL-NACL-NASULF-NA ASC-C) As per prep instructions. - Signed Rx of MOVIPREP 100 GM  SOLR (PEG-KCL-NACL-NASULF-NA ASC-C) As per prep instructions.;  #1 x 0;  Signed;  Entered by: Ezra Sites RN;  Authorized by: Hilarie Fredrickson MD;  Method used: Electronically to [facility] [phone]*, [address], [place], [place]  [address], Ph: [phone] or [phone], Fax: [phone] Allergies: Removed allergy or adverse reaction of CODEINE SULFATE (CODEINE SULFATE) Observations: Added new observation of NKA: T (07/14/2009 10:22)    Prescriptions: MOVIPREP 100 GM  SOLR (PEG-KCL-NACL-NASULF-NA ASC-C) As per prep instructions.  #1 x 0   Entered by:   Ezra Sites RN   Authorized by:   Hilarie Fredrickson MD   Signed by:   Ezra Sites RN on 07/14/2009   Method used:   [facility]ically to        [facility] [phone]* (retail)       [address]       [place], [place]  [address]       Ph: [phone] or [phone]       Fax: [phone]   RxID:   [id number]

## 2010-05-17 NOTE — Assessment & Plan Note (Signed)
Summary: cpx/njr   Vital Signs:  Patient profile:   77 year old male Height:      66 inches Weight:      188 pounds BMI:     30.45 Pulse rate:   72 / minute Resp:     12 per minute BP sitting:   128 / 58  (left arm)  Vitals Entered By: Gladis Riffle, RN (May 11, 2009 10:20 AM)   History of Present Illness: CPX  Preventive Screening-Counseling & Management  Alcohol-Tobacco     Smoking Status: quit     Year Started: pipe smoker  Current Problems (verified): 1)  Myocardial Infarction, Hx of  (ICD-412) 2)  Carotid Artery Stenosis, Without Infarction  (ICD-433.10) 3)  Hyperlipidemia  (ICD-272.4) 4)  Gerd  (ICD-530.81) 5)  Diabetes Mellitus, Type II  (ICD-250.00) 6)  Physical Examination  (ICD-V70.0)  Current Medications (verified): 1)  Fluticasone Propionate 50 Mcg/act  Susp (Fluticasone Propionate) .... One Spray Each Nostril Every Day 2)  Altace 10 Mg Caps (Ramipril) .... Take 1 Capsule By Mouth Once A Day 3)  Diclofenac Sodium 75 Mg Tbec (Diclofenac Sodium) .... Take 1 Tablet By Mouth Twice A Day 4)  Famotidine 20 Mg Tabs (Famotidine) .... Take 1 Tablet By Mouth Twice A Day As Needed 5)  Patanol 0.1 % Soln (Olopatadine Hcl) .... Apply 1 Drop Into Both Eyes Twice A Day 6)  Simvastatin 40 Mg Tabs (Simvastatin) .... Take 1 Tablet By Mouth At Bedtime 7)  Toprol Xl 25 Mg Tb24 (Metoprolol Succinate) .... Take 1 Tablet By Mouth Once A Day 8)  Glyburide-Metformin 5-500 Mg Tabs (Glyburide-Metformin) .... Take 1 Tablet By Mouth Two Times A Day 9)  Freestyle Lite Test   Strp (Glucose Blood) .... Use Once Daily As Directed 10)  Freestyle Unistick Ii Lancets   Misc (Lancets) .... Use As Directed 11)  Diphenoxylate-Atropine 2.5-0.025 Mg Tabs (Diphenoxylate-Atropine) .... Two Initially, Then One Every 4 Hours As Needed For Diarrhea 12)  Zovirax 5 % Crea (Acyclovir) .... Use As Needed 13)  Diclofenac Sodium 75 Mg Tbec (Diclofenac Sodium) .... Take 1 Tablet By Mouth Two Times A  Day  Allergies: 1)  ! Codeine Sulfate (Codeine Sulfate)  Comments:  Nurse/Medical Assistant: cpx, labs done--c/o cough and less stamina than before  The patient's medications and allergies were reviewed with the patient and were updated in the Medication and Allergy Lists. Gladis Riffle, RN (May 11, 2009 10:21 AM)  Past History:  Past Medical History: Last updated: 04/20/2008 Coronary artery disease. S/P inferior wall MI 2004 with PTCA/Stent; s/p CABG 2004 Diabetes mellitus, type II GERD Hyperlipidemia Myocardial infarction, hx of  Past Surgical History: Last updated: 04/20/2008 Coronary artery bypass graft 2004 (LIMA to LAD; SVG to ramus intermedius; SVG to PDA/PLSA  Family History: Last updated: 2007-04-21 father died at age 79 --hx CVA, hypertension mother died age 22 some form cirrhosis,not clear of etiology aunt died with cirrohisis related complications  age 434 two sisters--one with breast cancer age 43 paternal grandparents died in mid 66's of stroke maternal grandparents--grandmotherdied of stroke age 38, grandfather died of MI age 1  Social History: Last updated: 04/26/2007 Occupation:-financial Former Smoker Alcohol use-yes Regular exercise-no  Risk Factors: Exercise: no (04/26/2007)  Risk Factors: Smoking Status: quit (05/11/2009)  Review of Systems       All other systems reviewed and were negative   Physical Exam  General:  Well-developed,well-nourished,in no acute distress; alert,appropriate and cooperative throughout examination Head:  normocephalic and atraumatic.  Eyes:  pupils equal and pupils round.   Ears:  R ear normal and L ear normal.   Neck:  No deformities, masses, or tenderness noted. Chest Wall:  status post sternotomy Lungs:  Normal respiratory effort, chest expands symmetrically. Lungs are clear to auscultation, no crackles or wheezes. Heart:  Normal rate and regular rhythm. S1 and S2 normal without gallop, murmur, click,  rub or other extra sounds.  no tachycardia Abdomen:  obese soft and nontender.  There are active bowel sounds.  No guarding Prostate:  no nodules and no asymmetry.   Msk:  No deformity or scoliosis noted of thoracic or lumbar spine.   Pulses:  R radial normal and L radial normal.   Neurologic:  cranial nerves II-XII intact and gait normal.   Skin:  turgor normal and color normal.   Cervical Nodes:  no anterior cervical adenopathy and no posterior cervical adenopathy.   Psych:  normally interactive and good eye contact.    Diabetes Management Exam:    Eye Exam:       Eye Exam done elsewhere          Date: 04/17/2009          Results: normal-pt's report          Done by: ophthalm   Impression & Recommendations:  Problem # 1:  PHYSICAL EXAMINATION (ICD-V70.0)  Colonoscopy: normal (05/01/2003) Td Booster: Tdap (12/22/2008)   Flu Vax: Fluvax 3+ (02/09/2009)   Pneumovax: given (02/25/1998) Chol: 169 (05/03/2009)   HDL: 32.00 (05/03/2009)   LDL: 101 (05/03/2009)   TG: 178.0 (05/03/2009) TSH: 1.45 (05/03/2009)   HgbA1C: 8.3 (05/03/2009)   PSA: 1.10 (05/03/2009)  he needs to exercis and follow a low fat diet lose 10 pounds in next 4 months labs in 4 months  Problem # 2:  GERD (ICD-530.81) controlled continue current medications  His updated medication list for this problem includes:    Famotidine 20 Mg Tabs (Famotidine) .Marland Kitchen... Take 1 tablet by mouth twice a day as needed  Labs Reviewed: Hgb: 12.6 (05/03/2009)   Hct: 38.0 (05/03/2009)  Problem # 3:  HYPERLIPIDEMIA (ICD-272.4) well controlled continue current medications  His updated medication list for this problem includes:    Simvastatin 40 Mg Tabs (Simvastatin) .Marland Kitchen... Take 1 tablet by mouth at bedtime  Labs Reviewed: SGOT: 25 (05/03/2009)   SGPT: 31 (05/03/2009)   HDL:32.00 (05/03/2009), 31.80 (10/07/2008)  LDL:101 (05/03/2009), 114 (10/07/2008)  Chol:169 (05/03/2009), 181 (10/07/2008)  Trig:178.0 (05/03/2009), 176.0  (10/07/2008)  Problem # 4:  ANEMIA, OTHER UNSPEC (ICD-285.9)  refer to GI  Orders: Gastroenterology Referral (GI)  Complete Medication List: 1)  Fluticasone Propionate 50 Mcg/act Susp (Fluticasone propionate) .... One spray each nostril every day 2)  Altace 10 Mg Caps (Ramipril) .... Take 1 capsule by mouth once a day 3)  Diclofenac Sodium 75 Mg Tbec (Diclofenac sodium) .... Take 1 tablet by mouth twice a day 4)  Famotidine 20 Mg Tabs (Famotidine) .... Take 1 tablet by mouth twice a day as needed 5)  Patanol 0.1 % Soln (Olopatadine hcl) .... Apply 1 drop into both eyes twice a day 6)  Simvastatin 40 Mg Tabs (Simvastatin) .... Take 1 tablet by mouth at bedtime 7)  Toprol Xl 25 Mg Tb24 (Metoprolol succinate) .... Take 1 tablet by mouth once a day 8)  Glyburide-metformin 5-500 Mg Tabs (Glyburide-metformin) .... Take 1 tablet by mouth two times a day 9)  Freestyle Lite Test Strp (Glucose blood) .... Use once daily as directed  10)  Freestyle Unistick Ii Lancets Misc (Lancets) .... Use as directed 11)  Diphenoxylate-atropine 2.5-0.025 Mg Tabs (Diphenoxylate-atropine) .... Two initially, then one every 4 hours as needed for diarrhea 12)  Zovirax 5 % Crea (Acyclovir) .... Use as needed 13)  Diclofenac Sodium 75 Mg Tbec (Diclofenac sodium) .... Take 1 tablet by mouth two times a day  Patient Instructions: 1)  Please schedule a follow-up appointment in 4 months. 2)  labs one week prior to visit 3)  lipids---272.4 4)  lfts-995.2 5)  bmet-995.2 6)  A1C-250.02 7)

## 2010-05-17 NOTE — Assessment & Plan Note (Signed)
Summary: 4 month follow/cjr   Vital Signs:  Patient profile:   77 year old male Weight:      182 pounds BMI:     29.48 Temp:     98.5 degrees F oral Pulse rate:   80 / minute Resp:     16 per minute BP sitting:   129 / 76  Vitals Entered By: Lynann Beaver CMA (January 10, 2010 10:01 AM) CC: rov Is Patient Diabetic? Yes Pain Assessment Patient in pain? no        Primary Care Provider:  Birdie Sons MD  CC:  rov.  History of Present Illness:  Follow-Up Visit      This is a 77 year old man who presents for Follow-up visit.  The patient denies chest pain and palpitations.  Since the last visit the patient notes no new problems or concerns.  The patient reports taking meds as prescribed.  When questioned about possible medication side effects, the patient notes none.    All other systems reviewed and were negative   Current Problems (verified): 1)  Personal Hx Colonic Polyps  (ICD-V12.72) 2)  Anemia-iron Deficiency  (ICD-280.9) 3)  Cad, Artery Bypass Graft  (ICD-414.04) 4)  Anemia, Other Unspec  (ICD-285.9) 5)  Myocardial Infarction, Hx of  (ICD-412) 6)  Carotid Artery Stenosis, Without Infarction  (ICD-433.10) 7)  Hyperlipidemia  (ICD-272.4) 8)  Gerd  (ICD-530.81) 9)  Diabetes Mellitus, Type II  (ICD-250.00) 10)  Physical Examination  (ICD-V70.0)  Current Medications (verified): 1)  Fluticasone Propionate 50 Mcg/act  Susp (Fluticasone Propionate) .... Two Sprays Each Nostril Every Day 2)  Altace 10 Mg Caps (Ramipril) .... Take 1 Capsule By Mouth Once A Day 3)  Diclofenac Sodium 75 Mg Tbec (Diclofenac Sodium) .... Take 1 Tablet By Mouth Twice A Day 4)  Famotidine 20 Mg Tabs (Famotidine) .... Take 1 Tablet By Mouth Twice A Day As Needed 5)  Patanol 0.1 % Soln (Olopatadine Hcl) .... Apply 1 Drop Into Both Eyes Twice A Day 6)  Simvastatin 40 Mg Tabs (Simvastatin) .... Take 1 Tablet By Mouth At Bedtime 7)  Toprol Xl 25 Mg Tb24 (Metoprolol Succinate) .... Take 1 Tablet By  Mouth Once A Day 8)  Glyburide-Metformin 5-500 Mg Tabs (Glyburide-Metformin) .... Take 1 Tablet By Mouth Two Times A Day 9)  Freestyle Lite Test   Strp (Glucose Blood) .... Use Once Daily As Directed 10)  Freestyle Unistick Ii Lancets   Misc (Lancets) .... Use As Directed 11)  Daily Multi  Tabs (Multiple Vitamins-Minerals) .... Take One By Mouth Once Daily 12)  Fish Oil 1000 Mg Caps (Omega-3 Fatty Acids) .... Take One By Mouth Once Daily 13)  Ocuvite  Tabs (Multiple Vitamins-Minerals) .... Take Two Tabs By Mouth Once Daily 14)  Ferrous Sulfate 325 (65 Fe) Mg Tabs (Ferrous Sulfate) .... Take 1 Tablet By Mouth One  Time A Day  Allergies (verified): No Known Drug Allergies  Past History:  Past Medical History: Last updated: 06-24-2009 Coronary artery disease. S/P inferior wall MI 2004 with PTCA/Stent; s/p CABG 2004 Diabetes mellitus, type II GERD Hyperlipidemia Myocardial infarction, hx of Anemia  Past Surgical History: Last updated: 06/24/09 Coronary artery bypass graft 2004 (LIMA to LAD; SVG to ramus intermedius; SVG to PDA/PLSA Tonsillectomy  Family History: Last updated: 06/24/09 father died at age 36 --hx CVA, hypertension mother died age 82 some form cirrhosis,not clear of etiology Blood transfusions aunt died with cirrohisis related complications  age 68 blood transfusions two sisters--one with  breast cancer age 32 paternal grandparents died in mid 48's of stroke maternal grandparents--grandmotherdied of stroke age 106, grandfather died of MI age 96 No FH of Colon Cancer:  Social History: Last updated: 05/31/2009 Occupation:-financial Former Smoker pipe Alcohol use-yes Regular exercise-no Daily Caffeine Use coffee 3 cups a day Illicit Drug Use - no  Risk Factors: Exercise: no (04/26/2007)  Risk Factors: Smoking Status: quit (09/09/2009)  Physical Exam  General:  alert and well-developed.   Head:  normocephalic and atraumatic.   Eyes:  pupils equal and  pupils round.   Ears:  R ear normal and L ear normal.   Neck:  No deformities, masses, or tenderness noted. Chest Wall:  No deformities, masses, tenderness or gynecomastia noted. Lungs:  normal respiratory effort and no intercostal retractions.   Abdomen:  soft and non-tender.   Skin:  turgor normal and color normal.   Cervical Nodes:  no anterior cervical adenopathy and no posterior cervical adenopathy.   Psych:  good eye contact and not anxious appearing.     Impression & Recommendations:  Problem # 1:  CAD, ARTERY BYPASS GRAFT (ICD-414.04) no sxs continue current medications  His updated medication list for this problem includes:    Altace 10 Mg Caps (Ramipril) .Marland Kitchen... Take 1 capsule by mouth once a day    Toprol Xl 25 Mg Tb24 (Metoprolol succinate) .Marland Kitchen... Take 1 tablet by mouth once a day  Problem # 2:  HYPERLIPIDEMIA (ICD-272.4) controlled continue current medications  His updated medication list for this problem includes:    Simvastatin 40 Mg Tabs (Simvastatin) .Marland Kitchen... Take 1 tablet by mouth at bedtime  Labs Reviewed: SGOT: 22 (09/02/2009)   SGPT: 27 (09/02/2009)   HDL:32.20 (12/31/2009), 32.90 (09/02/2009)  LDL:104 (12/31/2009), 94 (16/01/9603)  Chol:164 (12/31/2009), 157 (09/02/2009)  Trig:137.0 (12/31/2009), 153.0 (09/02/2009)  Problem # 3:  DIABETES MELLITUS, TYPE II (ICD-250.00) better but could be better advised weight loss and daily exercise His updated medication list for this problem includes:    Altace 10 Mg Caps (Ramipril) .Marland Kitchen... Take 1 capsule by mouth once a day    Glyburide-metformin 5-500 Mg Tabs (Glyburide-metformin) .Marland Kitchen... Take 1 tablet by mouth two times a day  Labs Reviewed: Creat: 1.0 (12/31/2009)     Last Eye Exam: normal (12/15/2009) Reviewed HgBA1c results: 7.8 (12/31/2009)  8.1 (09/02/2009)  Problem # 4:  GERD (ICD-530.81) controlled continue current medications  His updated medication list for this problem includes:    Famotidine 20 Mg Tabs  (Famotidine) .Marland Kitchen... Take 1 tablet by mouth twice a day as needed  EGD: DONE (07/21/2009)  Labs Reviewed: Hgb: 12.2 (12/31/2009)   Hct: 35.3 (12/31/2009)  Problem # 5:  ANEMIA-IRON DEFICIENCY (ICD-280.9) stable---will continue to monitor His updated medication list for this problem includes:    Ferrous Sulfate 325 (65 Fe) Mg Tabs (Ferrous sulfate) .Marland Kitchen... Take 1 tablet by mouth one  time a day  Complete Medication List: 1)  Fluticasone Propionate 50 Mcg/act Susp (Fluticasone propionate) .... Two sprays each nostril every day 2)  Altace 10 Mg Caps (Ramipril) .... Take 1 capsule by mouth once a day 3)  Diclofenac Sodium 75 Mg Tbec (Diclofenac sodium) .... Take 1 tablet by mouth twice a day 4)  Famotidine 20 Mg Tabs (Famotidine) .... Take 1 tablet by mouth twice a day as needed 5)  Patanol 0.1 % Soln (Olopatadine hcl) .... Apply 1 drop into both eyes twice a day 6)  Simvastatin 40 Mg Tabs (Simvastatin) .... Take 1 tablet by mouth at  bedtime 7)  Toprol Xl 25 Mg Tb24 (Metoprolol succinate) .... Take 1 tablet by mouth once a day 8)  Glyburide-metformin 5-500 Mg Tabs (Glyburide-metformin) .... Take 1 tablet by mouth two times a day 9)  Freestyle Lite Test Strp (Glucose blood) .... Use once daily as directed 10)  Freestyle Unistick Ii Lancets Misc (Lancets) .... Use as directed 11)  Daily Multi Tabs (Multiple vitamins-minerals) .... Take one by mouth once daily 12)  Fish Oil 1000 Mg Caps (Omega-3 fatty acids) .... Take one by mouth once daily 13)  Ocuvite Tabs (Multiple vitamins-minerals) .... Take two tabs by mouth once daily 14)  Ferrous Sulfate 325 (65 Fe) Mg Tabs (Ferrous sulfate) .... Take 1 tablet by mouth one  time a day  Patient Instructions: 1)  Please schedule a follow-up appointment in 4 months. 2)  labs one week prior to visit 3)  lipids---272.4 4)  lfts-995.2 5)  bmet-995.2 6)  A1C-250.02 7)

## 2010-05-17 NOTE — Letter (Signed)
Summary: Previsit letter  [name]  [address] [place], [place] [address]   Phone: [phone]  Fax: [phone]       06/03/2009 MRN: [id number]  [facility] [address] [place], [place]  [address]  Dear [name],  Welcome to the Gastroenterology Division at [facility].    You are scheduled to see a nurse for your pre-procedure visit on  07/14/2009 at 10:30 a.m.(please note date change as the other appt.would be too early for your insurance pre-certification.)  If that date and time is not covenient for you please call me back.Your procedure is scheduled for 07/21/2009 at 10:30 am. per our phone conversation.  Your nurse visit will consist of discussing your medical and surgical history, your immediate family medical history, and your medications.    Please bring a complete list of all your medications or, if you prefer, bring the medication bottles and we will list them.  We will need to be aware of both prescribed and over the counter drugs.  We will need to know exact dosage information as well.  If you are on blood thinners (Coumadin, Plavix, Aggrenox, Ticlid, etc.) please call our office today/prior to your appointment, as we need to consult with your physician about holding your medication.   Please be prepared to read and sign documents such as consent forms, a financial agreement, and acknowledgement forms.  If necessary, and with your consent, a friend or relative is welcome to sit-in on the nurse visit with you.  Please bring your insurance card so that we may make a copy of it.  If your insurance requires a referral to see a specialist, please bring your referral form from your primary care physician.  No co-pay is required for this nurse visit.     If you cannot keep your appointment, please call [phone] to cancel or reschedule prior to your appointment date.  This allows [place] the opportunity to schedule an appointment for another patient in need of  care.    Thank you for choosing [facility] for your medical needs.  We appreciate the opportunity to care for you.  Please visit [place] at our website  to learn more about our practice.                     Sincerely.                                                                                                                   The Gastroenterology Division

## 2010-05-17 NOTE — Progress Notes (Signed)
Summary: PHARMACY HAVE QUESTION ABOUT MEDICATION   Phone Note From Pharmacy   Caller: [facility]/[phone] Summary of Call: PHARMACY HAVE QUESTIONS ABOUT PATANOL Initial call taken by: Judie Grieve,  May 26, 2009 4:35 PM  Follow-up for Phone Call        Spoke with pts wife she suggest I try Dr Cato Mulligan in order to get refill done because it is not cardaic Follow-up by: Burnett Kanaris, CNA,  May 27, 2009 3:28 PM  Additional Follow-up for Phone Call Additional follow up Details #1::        Last ordered by dr Americus Scheurich in 2007.  Left message at pt work to call back,  [facility] and [facility] patanol is 0.1% and sig: one drop both eyes two times a day   Patient notified via personal voice mail. Additional Follow-up by: Gladis Riffle, RN,  May 27, 2009 4:23 PM

## 2010-05-17 NOTE — Miscellaneous (Signed)
Summary: Clotest  Clinical Lists Changes  Problems: Added new problem of DUODENITIS (ICD-535.60) Orders: Added new Test order of TLB-H Pylori Screen Gastric Biopsy (83013-CLOTEST) - Signed

## 2010-05-17 NOTE — Miscellaneous (Signed)
Summary: Diabetic Eye Exam Report   Clinical Lists Changes  Observations: Added new observation of EYES COMMENT: 12/2010 (12/31/2009 16:25) Added new observation of EYE EXAM BY: Blair Promise (12/15/2009 16:26) Added new observation of DMEYEEXMRES: normal (12/15/2009 16:26) Added new observation of DIAB EYE EX: normal (12/15/2009 16:26)      Diabetes Management History:      He says that he is not exercising regularly.    Diabetes Management Exam:    Eye Exam:       Eye Exam done elsewhere          Date: 12/15/2009          Results: normal          Done by: Blair Promise

## 2010-05-17 NOTE — Assessment & Plan Note (Signed)
Summary: ?bronchitis/njr   Vital Signs:  Patient profile:   77 year old male Weight:      180 pounds Temp:     98.0 degrees F oral BP sitting:   120 / 70  (left arm) Cuff size:   regular  Primary Care Provider:  Birdie Sons MD   History of Present Illness: Steven [name] is a 77 year old male, nonsmoker, married, who comes in with a 10 day history of head congestion, sore throat, and cough.  For the past 4 nights the cough is so bad he can lie down and sleep at night.  He feels short of breath.  He's had no cardiac symptoms.  He does have a history of bypass surgery.  He has has a history of allergic rhinitis, but no history of previous asthma.  Current Medications (verified): 1)  Fluticasone Propionate 50 Mcg/act  Susp (Fluticasone Propionate) .... Two Sprays Each Nostril Every Day 2)  Altace 10 Mg Caps (Ramipril) .... Take 1 Capsule By Mouth Once A Day 3)  Diclofenac Sodium 75 Mg Tbec (Diclofenac Sodium) .... Take 1 Tablet By Mouth Twice A Day 4)  Famotidine 20 Mg Tabs (Famotidine) .... Take 1 Tablet By Mouth Twice A Day As Needed 5)  Patanol 0.1 % Soln (Olopatadine Hcl) .... Apply 1 Drop Into Both Eyes Twice A Day 6)  Simvastatin 40 Mg Tabs (Simvastatin) .... Take 1 Tablet By Mouth At Bedtime 7)  Toprol Xl 25 Mg Tb24 (Metoprolol Succinate) .... Take 1 Tablet By Mouth Once A Day 8)  Glyburide-Metformin 5-500 Mg Tabs (Glyburide-Metformin) .... Take 1 Tablet By Mouth Two Times A Day 9)  Freestyle Lite Test   Strp (Glucose Blood) .... Use Once Daily As Directed 10)  Freestyle Unistick Ii Lancets   Misc (Lancets) .... Use As Directed 11)  Daily Multi  Tabs (Multiple Vitamins-Minerals) .... Take One By Mouth Once Daily 12)  Fish Oil 1000 Mg Caps (Omega-3 Fatty Acids) .... Take One By Mouth Once Daily 13)  Ocuvite  Tabs (Multiple Vitamins-Minerals) .... Take Two Tabs By Mouth Once Daily 14)  Ferrous Sulfate 325 (65 Fe) Mg Tabs (Ferrous Sulfate) .... Take 1 Tablet By Mouth One  Time A  Day  Allergies (verified): No Known Drug Allergies  Past History:  Past medical, surgical, family and social histories (including risk factors) reviewed for relevance to current acute and chronic problems.  Past Medical History: Reviewed history from 05/31/2009 and no changes required. Coronary artery disease. S/P inferior wall MI 2004 with PTCA/Stent; s/p CABG 2004 Diabetes mellitus, type II GERD Hyperlipidemia Myocardial infarction, hx of Anemia  Past Surgical History: Reviewed history from 05/31/2009 and no changes required. Coronary artery bypass graft 2004 (LIMA to LAD; SVG to ramus intermedius; SVG to PDA/PLSA Tonsillectomy  Family History: Reviewed history from 05/31/2009 and no changes required. father died at age 17 --hx CVA, hypertension mother died age 13 some form cirrhosis,not clear of etiology Blood transfusions aunt died with cirrohisis related complications  age 72 blood transfusions two sisters--one with breast cancer age 19 paternal grandparents died in mid 82's of stroke maternal grandparents--grandmotherdied of stroke age 47, grandfather died of MI age 29 No FH of Colon Cancer:  Social History: Reviewed history from 05/31/2009 and no changes required. Occupation:-financial Former Smoker pipe Alcohol use-yes Regular exercise-no Daily Caffeine Use coffee 3 cups a day Illicit Drug Use - no  Review of Systems      See HPI  Physical Exam  General:  Well-developed,well-nourished,in no acute distress;  alert,appropriate and cooperative throughout examination Head:  Normocephalic and atraumatic without obvious abnormalities. No apparent alopecia or balding. Eyes:  No corneal or conjunctival inflammation noted. EOMI. Perrla. Funduscopic exam benign, without hemorrhages, exudates or papilledema. Vision grossly normal. Ears:  External ear exam shows no significant lesions or deformities.  Otoscopic examination reveals clear canals, tympanic membranes are  intact bilaterally without bulging, retraction, inflammation or discharge. Hearing is grossly normal bilaterally. Nose:  External nasal examination shows no deformity or inflammation. Nasal mucosa are pink and moist without lesions or exudates. Mouth:  Oral mucosa and oropharynx without lesions or exudates.  Teeth in good repair. Neck:  No deformities, masses, or tenderness noted. Chest Wall:  No deformities, masses, tenderness or gynecomastia noted. Lungs:  symmetrical breath sounds bilateral wheezing   Problems:  Medical Problems Added: 1)  Dx of Asthma, Acute  (ZOX-096.04)  Impression & Recommendations:  Problem # 1:  ASTHMA, ACUTE (VWU-981.19) Assessment New  His updated medication list for this problem includes:    Prednisone 20 Mg Tabs (Prednisone) ..... Uad  Orders: Prescription Created Electronically [phone])  Complete Medication List: 1)  Fluticasone Propionate 50 Mcg/act Susp (Fluticasone propionate) .... Two sprays each nostril every day 2)  Altace 10 Mg Caps (Ramipril) .... Take 1 capsule by mouth once a day 3)  Diclofenac Sodium 75 Mg Tbec (Diclofenac sodium) .... Take 1 tablet by mouth twice a day 4)  Famotidine 20 Mg Tabs (Famotidine) .... Take 1 tablet by mouth twice a day as needed 5)  Patanol 0.1 % Soln (Olopatadine hcl) .... Apply 1 drop into both eyes twice a day 6)  Simvastatin 40 Mg Tabs (Simvastatin) .... Take 1 tablet by mouth at bedtime 7)  Toprol Xl 25 Mg Tb24 (Metoprolol succinate) .... Take 1 tablet by mouth once a day 8)  Glyburide-metformin 5-500 Mg Tabs (Glyburide-metformin) .... Take 1 tablet by mouth two times a day 9)  Freestyle Lite Test Strp (Glucose blood) .... Use once daily as directed 10)  Freestyle Unistick Ii Lancets Misc (Lancets) .... Use as directed 11)  Daily Multi Tabs (Multiple vitamins-minerals) .... Take one by mouth once daily 12)  Fish Oil 1000 Mg Caps (Omega-3 fatty acids) .... Take one by mouth once daily 13)  Ocuvite Tabs  (Multiple vitamins-minerals) .... Take two tabs by mouth once daily 14)  Ferrous Sulfate 325 (65 Fe) Mg Tabs (Ferrous sulfate) .... Take 1 tablet by mouth one  time a day 15)  Prednisone 20 Mg Tabs (Prednisone) .... Uad 16)  Hydromet 5-1.5 Mg/53ml Syrp (Hydrocodone-homatropine) .Marland Kitchen.. 1 or 2 tsps three times a day as needed  Patient Instructions: 1)  drink 30 ounces of water daily, begin prednisone two tabs now then two tablets every morning starting tomorrow morning.  See Dr. Cato Mulligan on Thursday for follow-up. 2)  u  may take one or 2 teaspoons of Hydromet, up to 3 times a day to help with the cough Prescriptions: HYDROMET 5-1.5 MG/5ML SYRP (HYDROCODONE-HOMATROPINE) 1 or 2 tsps three times a day as needed  #8oz x 1   Entered and Authorized by:   Roderick Pee MD   Signed by:   Roderick Pee MD on 09/20/2009   Method used:   Print then Give to Patient   RxID:   [id number] PREDNISONE 20 MG TABS (PREDNISONE) UAD  #40 x 1   Entered and Authorized by:   Roderick Pee MD   Signed by:   Roderick Pee MD on 09/20/2009  Method used:   [facility]        [facility]       [address]       [place], [place]  [address]       Ph: [phone] or [phone]       Fax: [phone]   RxID:   [id number]

## 2010-05-17 NOTE — Letter (Signed)
Summary: Diabetic Eye Exam / Blair Promise MD  Diabetic Eye Exam / Blair Promise MD   Imported By: Lennie Odor 01/05/2010 14:50:13  _____________________________________________________________________  External Attachment:    Type:   Image     Comment:   External Document

## 2010-05-17 NOTE — Letter (Signed)
Summary: Diabetic Instructions  [facility]  [address] [place], [place] [address]   Phone: [phone]  Fax: [phone]    [name] 05-11-1930 MRN: [id number]   _  _   ORAL DIABETIC MEDICATION INSTRUCTIONS  The day before your procedure:   Take your diabetic pill as you do normally  The day of your procedure:   Do not take your diabetic pill    We will check your blood sugar levels during the admission process and again in Recovery before discharging you home  ________________________________________________________________________

## 2010-05-17 NOTE — Medication Information (Signed)
Summary: RX Folder  RX Folder   Imported By: Kassie Mends 06/29/2009 12:34:36  _____________________________________________________________________  External Attachment:    Type:   Image     Comment:   External Document

## 2010-05-19 NOTE — Assessment & Plan Note (Signed)
Summary: TESTOSTERONE INJ // RS   Nurse Visit   Allergies: No Known Drug Allergies  Medication Administration  Injection # 1:    Medication: Testosterone Cypionat 200mg  ing    Diagnosis: TESTICULAR HYPOFUNCTION (ICD-257.2)    Route: IM    Site: LUOQ gluteus    Exp Date: 06/16/2011    Lot #: [id number]    Mfr: Gaylyn Rong    Patient tolerated injection without complications    Given by: Alfred Levins, CMA (April 04, 2010 1:43 PM)  Orders Added: 1)  Admin of patients own med IM/SQ [phone]

## 2010-05-19 NOTE — Progress Notes (Signed)
Summary: referral  Phone Note Call from Patient Call back at Home Phone [phone]   Caller: Patient Call For: [name] Summary of Call: pt takes veramyst and its not helping.  He has already tried flonase.  He sees Dr Jearld Fenton and wants to see someone else and wants to know who you recommend.  Per Dr Cato Mulligan send to Dr Suszanne Conners Initial call taken by: Alfred Levins, CMA,  May 09, 2010 5:06 PM

## 2010-05-19 NOTE — Progress Notes (Signed)
  Phone Note Call from Patient Call back at Home Phone [phone]   Caller: Patient Call For: swords Summary of Call: pt called and said that it was recommended to him by his pharmicist that he take veramyst instead of flonase.  He wanted to know if he could have a sample.  Told pt we did have any samples but I would call in a rx.  Also needs a refill on zovirax cream.  [facility] [address] pt aware Initial call taken by: Alfred Levins, CMA,  April 20, 2010 4:57 PM    New/Updated Medications: VERAMYST 27.5 MCG/SPRAY SUSP (FLUTICASONE FUROATE) 2 sprays each nostril daily ZOVIRAX 5 % CREA (ACYCLOVIR) as needed Prescriptions: ZOVIRAX 5 % CREA (ACYCLOVIR) as needed  #30 x 1   Entered by:   Alfred Levins, CMA   Authorized by:   Birdie Sons MD   Signed by:   Alfred Levins, CMA on 04/20/2010   Method used:   [facility]        [phone][facility] (retail)       [address]       [place], [place]  [address]       Ph: [phone] or [phone]       Fax: [phone]   RxID:   [id number] VERAMYST 27.5 MCG/SPRAY SUSP (FLUTICASONE FUROATE) 2 sprays each nostril daily  #1 x 0   Entered by:   Alfred Levins, CMA   Authorized by:   Birdie Sons MD   Signed by:   Alfred Levins, CMA on 04/20/2010   Method used:   [facility]ically to        [phone] ([facility]ail[facility]       [address]       [place], [place]  [address]       Ph: [phone] or [phone]       Fax: [phone]   RxID:   [id number]

## 2010-05-25 NOTE — Assessment & Plan Note (Signed)
Summary: cpx/njr   Vital Signs:  Patient profile:   77 year old male Height:      66 inches Weight:      185 pounds Temp:     98.9 degrees F oral Pulse rate:   84 / minute Pulse rhythm:   regular BP sitting:   156 / 72  (left arm) Cuff size:   regular  Vitals Entered By: Alfred Levins, CMA (May 16, 2010 10:11 AM)  Serial Vital Signs/Assessments:  Time      Position  BP       Pulse  Resp  Temp     By                     138/66                         Birdie Sons MD  CC: cpx   Primary Care Provider:  Birdie Sons MD  CC:  cpx.  History of Present Illness: cpx  seeing dr Gerri Spore for knee pain---told he needs bilateral knee replacement. knees are bothersome when walking...not at rest.   Current Medications (verified): 1)  Veramyst 27.5 Mcg/spray Susp (Fluticasone Furoate) .... 2 Sprays Each Nostril Daily 2)  Altace 10 Mg Caps (Ramipril) .... Take 1 Capsule By Mouth Once A Day 3)  Diclofenac Sodium 75 Mg Tbec (Diclofenac Sodium) .... Take 1 Tablet By Mouth Twice A Day 4)  Famotidine 20 Mg Tabs (Famotidine) .... Take 1 Tablet By Mouth Twice A Day As Needed 5)  Patanol 0.1 % Soln (Olopatadine Hcl) .... Apply 1 Drop Into Both Eyes Twice A Day 6)  Simvastatin 40 Mg Tabs (Simvastatin) .... Take 1 Tablet By Mouth At Bedtime 7)  Toprol Xl 25 Mg Tb24 (Metoprolol Succinate) .... Take 1 Tablet By Mouth Once A Day 8)  Glyburide-Metformin 5-500 Mg Tabs (Glyburide-Metformin) .... Take 1 Tablet By Mouth Two Times A Day 9)  Freestyle Lite Test   Strp (Glucose Blood) .... Use Once Daily As Directed 10)  Freestyle Unistick Ii Lancets   Misc (Lancets) .... Use As Directed 11)  Daily Multi  Tabs (Multiple Vitamins-Minerals) .... Take One By Mouth Once Daily 12)  Fish Oil 1000 Mg Caps (Omega-3 Fatty Acids) .... Take One By Mouth Once Daily 13)  Ocuvite  Tabs (Multiple Vitamins-Minerals) .... Take Two Tabs By Mouth Once Daily 14)  Ferrous Sulfate 325 (65 Fe) Mg Tabs (Ferrous Sulfate) ....  Take 1 Tablet By Mouth One  Time A Day 15)  Depo-Testosterone 200 Mg/ml Oil (Testosterone Cypionate) .Marland Kitchen.. 1 Cc Intramuscularly Monthly 16)  Zovirax 5 % Crea (Acyclovir) .... As Needed  Allergies (verified): No Known Drug Allergies  Past History:  Past Medical History: Last updated: 2009/06/03 Coronary artery disease. S/P inferior wall MI 2004 with PTCA/Stent; s/p CABG 2004 Diabetes mellitus, type II GERD Hyperlipidemia Myocardial infarction, hx of Anemia  Past Surgical History: Last updated: 2009-06-03 Coronary artery bypass graft 2004 (LIMA to LAD; SVG to ramus intermedius; SVG to PDA/PLSA Tonsillectomy  Family History: Last updated: 06-03-2009 father died at age 63 --hx CVA, hypertension mother died age 38 some form cirrhosis,not clear of etiology Blood transfusions aunt died with cirrohisis related complications  age 22 blood transfusions two sisters--one with breast cancer age 47 paternal grandparents died in mid 51's of stroke maternal grandparents--grandmotherdied of stroke age 46, grandfather died of MI age 39 No FH of Colon Cancer:  Social History: Last updated: 06-03-2009 Occupation:-financial  Former Smoker pipe Alcohol use-yes Regular exercise-no Daily Caffeine Use coffee 3 cups a day Illicit Drug Use - no  Risk Factors: Exercise: no (04/26/2007)  Risk Factors: Smoking Status: quit (09/09/2009)  Physical Exam  General:   well-developed well-nourished male in no acute distress HEENT exam atraumatic, normocephalic, extraocular muscles are intact. Neck is supple. No lymphadenopathy, thyromegaly, jugular venous distention. Chest clear to auscultation without increased work of breathing. Cardiac exam S1-S2 are regular. No gallops. 2-3/6 HSM. Abdominal exam active bowel sounds, soft, nontender. He is overweight. Extremities no clubbing cyanosis or edema. Neurologic exam he is alert with a normal gait.   Impression & Recommendations:  Problem # 1:  PHYSICAL  EXAMINATION (ICD-V70.0) encouraged regular activity  Problem # 2:  DIABETES MELLITUS, TYPE II (ICD-250.00) not as well controlled see medicaiton list The following medications were removed from the medication list:    Glyburide-metformin 5-500 Mg Tabs (Glyburide-metformin) .Marland Kitchen... Take 1 tablet by mouth two times a day His updated medication list for this problem includes:    Altace 10 Mg Caps (Ramipril) .Marland Kitchen... Take 1 capsule by mouth once a day    Metformin Hcl 1000 Mg Tabs (Metformin hcl) .Marland Kitchen... Take 1 tablet by mouth two times a day    Glyburide 5 Mg Tabs (Glyburide) .Marland Kitchen... 1 tablet by mouth daily with breakfast  Labs Reviewed: Creat: 1.2 (05/09/2010)     Last Eye Exam: normal (12/15/2009) Reviewed HgBA1c results: 8.2 (05/09/2010)  7.8 (12/31/2009)  Problem # 3:  HYPERLIPIDEMIA (ICD-272.4) not adequately conttrolled change  medications  His updated medication list for this problem includes:    Atorvastatin Calcium 40 Mg Tabs (Atorvastatin calcium) .Marland Kitchen... Take 1 tablet by mouth once a day  Labs Reviewed: SGOT: 22 (05/09/2010)   SGPT: 24 (05/09/2010)   HDL:34.10 (05/09/2010), 32.20 (12/31/2009)  LDL:107 (05/09/2010), 104 (12/31/2009)  Chol:179 (05/09/2010), 164 (12/31/2009)  Trig:191.0 (05/09/2010), 137.0 (12/31/2009)  Problem # 4:  GERD (ICD-530.81) doing well on meds His updated medication list for this problem includes:    Famotidine 20 Mg Tabs (Famotidine) .Marland Kitchen... Take 1 tablet by mouth twice a day as needed  Complete Medication List: 1)  Veramyst 27.5 Mcg/spray Susp (Fluticasone furoate) .... 2 sprays each nostril daily 2)  Altace 10 Mg Caps (Ramipril) .... Take 1 capsule by mouth once a day 3)  Diclofenac Sodium 75 Mg Tbec (Diclofenac sodium) .... Take 1 tablet by mouth twice a day 4)  Famotidine 20 Mg Tabs (Famotidine) .... Take 1 tablet by mouth twice a day as needed 5)  Patanol 0.1 % Soln (Olopatadine hcl) .... Apply 1 drop into both eyes twice a day 6)  Atorvastatin  Calcium 40 Mg Tabs (Atorvastatin calcium) .... Take 1 tablet by mouth once a day 7)  Toprol Xl 25 Mg Tb24 (Metoprolol succinate) .... Take 1 tablet by mouth once a day 8)  Freestyle Lite Test Strp (Glucose blood) .... Use once daily as directed 9)  Freestyle Unistick Ii Lancets Misc (Lancets) .... Use as directed 10)  Daily Multi Tabs (Multiple vitamins-minerals) .... Take one by mouth once daily 11)  Fish Oil 1000 Mg Caps (Omega-3 fatty acids) .... Take one by mouth once daily 12)  Ocuvite Tabs (Multiple vitamins-minerals) .... Take two tabs by mouth once daily 13)  Ferrous Sulfate 325 (65 Fe) Mg Tabs (Ferrous sulfate) .... Take 1 tablet by mouth one  time a day 14)  Depo-testosterone 200 Mg/ml Oil (Testosterone cypionate) .Marland Kitchen.. 1 cc intramuscularly monthly 15)  Zovirax 5 % Crea (  Acyclovir) .... As needed 16)  Metformin Hcl 1000 Mg Tabs (Metformin hcl) .... Take 1 tablet by mouth two times a day 17)  Glyburide 5 Mg Tabs (Glyburide) .Marland Kitchen.. 1 tablet by mouth daily with breakfast  Patient Instructions: 1)  3 months 2)  labs one week prior to visit 3)  lipids---272.4 4)  lfts-995.2 5)  bmet-995.2 6)  A1C-250.02 7)     Prescriptions: GLYBURIDE 5 MG  TABS (GLYBURIDE) 1 tablet by mouth daily with breakfast  #90 x 3   Entered and Authorized by:   Birdie Sons MD   Signed by:   Birdie Sons MD on 05/16/2010   Method used:   Faxed to ...       [facility] education [facility])       [address] [address]       [place], [place]  [address]       Ph: [phone]       Fax: [phone]   RxID:   [id number] METFORMIN HCL 1000 MG TABS (METFORMIN HCL) Take 1 tablet by mouth two times a day  #180 x 3   Entered and Authorized by:   Birdie Sons MD   Signed by:   Birdie Sons MD on 05/16/2010   Method used:   Faxed to ...       [facility])       [address] [address]       [place], [place]  [address]       Ph: [phone]       Fax: [phone]   RxID:   [id number] ATORVASTATIN CALCIUM 40 MG TABS  (ATORVASTATIN CALCIUM) Take 1 tablet by mouth once a day  #90 x 3   Entered and Authorized by:   Birdie Sons MD   Signed by:   Birdie Sons MD on 05/16/2010   Method used:   Faxed to ...       [facility])       [address] [address]       [place], [place]  [address]       Ph: [phone]       Fax: [phone]   RxID:   [id number]    Orders Added: 1)  Est. Patient 65& > [[address]61]

## 2010-05-31 ENCOUNTER — Telehealth: Payer: Self-pay | Admitting: *Deleted

## 2010-05-31 NOTE — Telephone Encounter (Signed)
Pt changed to Lipitor from Zocor.  He wants to know if he can still eat grapefruit with the lipitor.  He said Dr Cato Mulligan told him he could eat one per day with Zocor and wants to confirm with lipitor

## 2010-06-01 NOTE — Telephone Encounter (Signed)
ok 

## 2010-06-01 NOTE — Telephone Encounter (Signed)
Pt aware, told him not to eat it to close to taking med

## 2010-06-09 ENCOUNTER — Encounter: Payer: Self-pay | Admitting: Internal Medicine

## 2010-06-10 ENCOUNTER — Encounter: Payer: Self-pay | Admitting: Internal Medicine

## 2010-06-10 ENCOUNTER — Ambulatory Visit (INDEPENDENT_AMBULATORY_CARE_PROVIDER_SITE_OTHER): Payer: Medicare Other | Admitting: Internal Medicine

## 2010-06-10 ENCOUNTER — Encounter (INDEPENDENT_AMBULATORY_CARE_PROVIDER_SITE_OTHER): Payer: Medicare Other

## 2010-06-10 DIAGNOSIS — I6529 Occlusion and stenosis of unspecified carotid artery: Secondary | ICD-10-CM

## 2010-06-10 DIAGNOSIS — E291 Testicular hypofunction: Secondary | ICD-10-CM

## 2010-06-10 DIAGNOSIS — E7800 Pure hypercholesterolemia, unspecified: Secondary | ICD-10-CM

## 2010-06-10 DIAGNOSIS — I2510 Atherosclerotic heart disease of native coronary artery without angina pectoris: Secondary | ICD-10-CM

## 2010-06-10 MED ORDER — TESTOSTERONE CYPIONATE 200 MG/ML IM SOLN
200.0000 mg | INTRAMUSCULAR | Status: DC
  Administered 2010-06-10 – 2010-09-08 (×3): 200 mg via INTRAMUSCULAR

## 2010-06-14 NOTE — Assessment & Plan Note (Signed)
Summary: 1 yr fu/ac  Medications Added METFORMIN HCL 500 MG TABS (METFORMIN HCL) two times a day      Allergies Added: NKDA  Visit Type:  1 year follow up Referring Provider:  Birdie Sons MD Primary Provider:  Birdie Sons MD  CC:  shortness of breath and .  History of Present Illness: [name] is a 77 year old with a histor of CAD (s/p IWMI with PTCA/stent to RCA and CABG in 2004).  He was previously followed by B. Downey.  I last saw him in clinic In February 2011. Since seen he has had some knee problems.  Getting shot today Now when gets mail, paper breaths a little heavier.  Worse than when walking.  NO chest pains. With MI had fatigue, sweat.  No chest pain.   Current Medications (verified): 1)  Veramyst 27.5 Mcg/spray Susp (Fluticasone Furoate) .... 2 Sprays Each Nostril Daily 2)  Altace 10 Mg Caps (Ramipril) .... Take 1 Capsule By Mouth Once A Day 3)  Diclofenac Sodium 75 Mg Tbec (Diclofenac Sodium) .... Take 1 Tablet By Mouth Twice A Day 4)  Famotidine 20 Mg Tabs (Famotidine) .... Take 1 Tablet By Mouth Twice A Day As Needed 5)  Patanol 0.1 % Soln (Olopatadine Hcl) .... Apply 1 Drop Into Both Eyes Twice A Day 6)  Atorvastatin Calcium 40 Mg Tabs (Atorvastatin Calcium) .... Take 1 Tablet By Mouth Once A Day 7)  Toprol Xl 25 Mg Tb24 (Metoprolol Succinate) .... Take 1 Tablet By Mouth Once A Day 8)  Freestyle Lite Test   Strp (Glucose Blood) .... Use Once Daily As Directed 9)  Freestyle Unistick Ii Lancets   Misc (Lancets) .... Use As Directed 10)  Daily Multi  Tabs (Multiple Vitamins-Minerals) .... Take One By Mouth Once Daily 11)  Fish Oil 1000 Mg Caps (Omega-3 Fatty Acids) .... Take One By Mouth Once Daily 12)  Ocuvite  Tabs (Multiple Vitamins-Minerals) .... Take Two Tabs By Mouth Once Daily 13)  Ferrous Sulfate 325 (65 Fe) Mg Tabs (Ferrous Sulfate) .... Take 1 Tablet By Mouth One  Time A Day 14)  Depo-Testosterone 200 Mg/ml Oil (Testosterone Cypionate) .Marland Kitchen.. 1 Cc  Intramuscularly Monthly 15)  Zovirax 5 % Crea (Acyclovir) .... As Needed 16)  Metformin Hcl 500 Mg Tabs (Metformin Hcl) .... Two Times A Day 17)  Glyburide 5 Mg  Tabs (Glyburide) .Marland Kitchen.. 1 Tablet By Mouth Daily With Breakfast  Allergies (verified): No Known Drug Allergies  Past History:  Past medical, surgical, family and social histories (including risk factors) reviewed, and no changes noted (except as noted below).  Past Medical History: Reviewed history from 05/31/2009 and no changes required. Coronary artery disease. S/P inferior wall MI 2004 with PTCA/Stent; s/p CABG 2004 Diabetes mellitus, type II GERD Hyperlipidemia Myocardial infarction, hx of Anemia  Past Surgical History: Reviewed history from 05/31/2009 and no changes required. Coronary artery bypass graft 2004 (LIMA to LAD; SVG to ramus intermedius; SVG to PDA/PLSA Tonsillectomy  Family History: Reviewed history from 05/31/2009 and no changes required. father died at age 81 --hx CVA, hypertension mother died age 69 some form cirrhosis,not clear of etiology Blood transfusions aunt died with cirrohisis related complications  age 26 blood transfusions two sisters--one with breast cancer age 67 paternal grandparents died in mid 33's of stroke maternal grandparents--grandmotherdied of stroke age 10, grandfather died of MI age 72 No FH of Colon Cancer:  Social History: Reviewed history from 05/31/2009 and no changes required. Occupation:-financial Former Smoker pipe Alcohol  use-yes Regular exercise-no Daily Caffeine Use coffee 3 cups a day Illicit Drug Use - no  Review of Systems       All systems reviewed.  Neg to the above problem except as noted.  does note some burning on soles of feet.  Vital Signs:  Patient profile:   77 year old male Height:      66 inches Weight:      169 pounds BMI:     27.38 Pulse rate:   66 / minute BP sitting:   130 / 66  (left arm) Cuff size:   regular  Vitals Entered By:  Caralee Ates CMA (June 10, 2010 9:39 AM)  Physical Exam  Additional Exam:  Patient in NAD HEENT:  Normocephalic, atraumatic. EOMI, PERRLA.  Neck: JVP is normal. No thyromegaly. Lungs: clear to auscultation. No rales no wheezes.  Heart: Regular rate and rhythm. Normal S1, S2. No S3.   Gr. I/VI systolic murmur at apex. PMI not displaced.  Abdomen:  Supple, nontender. Normal bowel sounds. No masses. No hepatomegaly.  Extremities:   Good distal pulses throughout. No lower extremity edema.  Musculoskeletal :moving all extremities.  Neuro:   alert and oriented x3.    EKG  Procedure date:  06/10/2010  Findings:      NSR.  64 bpm.  Impression & Recommendations:  Problem # 1:  CAD, ARTERY BYPASS GRAFT (ICD-414.04)  Patinet with no history of angina.  NOw with some SOB with acitivty    Question if due to deconditioning vs angina equiv. Recomm Lexiscan myoview.  COntinue meds. His updated medication list for this problem includes:    Altace 10 Mg Caps (Ramipril) .Marland Kitchen... Take 1 capsule by mouth once a day    Toprol Xl 25 Mg Tb24 (Metoprolol succinate) .Marland Kitchen... Take 1 tablet by mouth once a day  Orders: EKG w/ Interpretation (93000) Nuclear Stress Test (Nuc Stress Test)  Problem # 2:  CAROTID ARTERY STENOSIS, WITHOUT INFARCTION (ICD-433.10) Stable.  Problem # 3:  HYPERLIPIDEMIA (ICD-272.4) Note that Patient is now on  Lipitor.  Will need to have lipids rechecked   If still high may need to switch to Crestor.  B. Swords to follow. Diet good.  Less active. His updated medication list for this problem includes:    Atorvastatin Calcium 40 Mg Tabs (Atorvastatin calcium) .Marland Kitchen... Take 1 tablet by mouth once a day  Problem # 4:  DIABETES MELLITUS, TYPE II (ICD-250.00) Continue.  F.U with B Swords/ His updated medication list for this problem includes:    Altace 10 Mg Caps (Ramipril) .Marland Kitchen... Take 1 capsule by mouth once a day    Metformin Hcl 500 Mg Tabs (Metformin hcl) .Marland Kitchen..Marland Kitchen Two times a day     Glyburide 5 Mg Tabs (Glyburide) .Marland Kitchen... 1 tablet by mouth daily with breakfast  Patient Instructions: 1)  Your physician has requested that you have a lexiscan myoview.  For further information please visit [email or web address]  Please follow instruction sheet, as given.  we will call you with results

## 2010-06-14 NOTE — Miscellaneous (Signed)
Summary: Orders Update  Clinical Lists Changes  Orders: Added new Test order of Carotid Duplex (Carotid Duplex) - Signed 

## 2010-06-22 ENCOUNTER — Telehealth (INDEPENDENT_AMBULATORY_CARE_PROVIDER_SITE_OTHER): Payer: Self-pay | Admitting: Radiology

## 2010-06-23 ENCOUNTER — Encounter: Payer: Self-pay | Admitting: Cardiology

## 2010-06-23 ENCOUNTER — Ambulatory Visit (HOSPITAL_COMMUNITY): Payer: Medicare Other | Attending: Internal Medicine

## 2010-06-23 DIAGNOSIS — R0609 Other forms of dyspnea: Secondary | ICD-10-CM

## 2010-06-23 DIAGNOSIS — R0989 Other specified symptoms and signs involving the circulatory and respiratory systems: Secondary | ICD-10-CM

## 2010-06-23 DIAGNOSIS — I25810 Atherosclerosis of coronary artery bypass graft(s) without angina pectoris: Secondary | ICD-10-CM | POA: Insufficient documentation

## 2010-06-28 NOTE — Progress Notes (Signed)
Summary: Nuclear Pre-Procedure  Phone Note Outgoing Call Call back at [facility] Phone [phone]   Call placed by: Stanton Kidney, EMT-P,  June 22, 2010 1:56 PM Call placed to: Patient Action Taken: Phone Call Completed Reason for Call: Confirm/change Appt Summary of Call: Reviewed information on Myoview Information Sheet (see scanned document for further details).  Spoke with the patient's wife. Stanton Kidney, EMT-P  June 22, 2010 1:56 PM      Nuclear Med Background Indications for Stress Test: Evaluation for Ischemia, Graft Patency, Stent Patency, PTCA Patency   History: Angioplasty, CABG, Echo, Myocardial Infarction, Myocardial Perfusion Study, Stents  History Comments: '04 MI -IWMI / PTCA&Stent-RCA / Cabg x 4; '09 MPS -mod. scar infero-lat. EF= 52% / Echo- EF=50%  Symptoms: DOE    Nuclear Pre-Procedure Cardiac Risk Factors: Carotid Disease, History of Smoking, Lipids, NIDDM Height (in): 66

## 2010-06-28 NOTE — Assessment & Plan Note (Addendum)
Summary: Cardiology Nuclear Testing  Nuclear Med Background Indications for Stress Test: Evaluation for Ischemia, Graft Patency, Stent Patency, PTCA Patency   History: Angioplasty, CABG, Echo, Myocardial Infarction, Myocardial Perfusion Study, Stents  History Comments: '04 MI -IWMI / PTCA&Stent-RCA / Cabg x 4; '09 MPS -mod. scar infero-lat. EF= 52% / Echo- EF=50%  Symptoms: DOE    Nuclear Pre-Procedure Cardiac Risk Factors: Carotid Disease, History of Smoking, Lipids, NIDDM Caffeine/Decaff Intake: None NPO After: 8:00 AM Lungs: clear IV 0.9% NS with Angio Cath: 20g     IV Site: R Antecubital IV Started by: Stanton Kidney, EMT-P Chest Size (in) 42     Height (in): 68 Weight (lb): 184 BMI: 28.08 Tech Comments: CBG=109 @ 11:30 am. Stanton Kidney, EMT-P  June 23, 2010 12:48 PM   Nuclear Med Study 1 or 2 day study:  1 day     Stress Test Type:  Eugenie Birks Reading MD:  Willa Rough, MD     Referring MD:  P.Ross Resting Radionuclide:  Technetium 41m Tetrofosmin     Resting Radionuclide Dose:  10.8 mCi  Stress Radionuclide:  Technetium 97m Tetrofosmin     Stress Radionuclide Dose:  33 mCi   Stress Protocol  Max Systolic BP: 122 mm Hg Lexiscan: 0.4 mg   Stress Test Technologist:  Milana Na, EMT-P     Nuclear Technologist:  Doyne Keel, CNMT  Rest Procedure  Myocardial perfusion imaging was performed at rest 45 minutes following the intravenous administration of Technetium 3m Tetrofosmin.  Stress Procedure  The patient received IV Lexiscan 0.4 mg over 15-seconds.  Technetium 53m Tetrofosmin injected at 30-seconds.  There were no significant changes with infusion.  Quantitative spect images were obtained after a 45 minute delay.  QPS Raw Data Images:  Patient motion noted; appropriate software correction applied. Stress Images:  Decreased activity in the inferior wall. Rest Images:  Same as stress Subtraction (SDS):  No evidence of ischemia. Transient Ischemic Dilatation:   1.07  (Normal <1.22)  Lung/Heart Ratio:  .28  (Normal <0.45)  Quantitative Gated Spect Images QGS EDV:  140 ml QGS ESV:  75 ml QGS EF:  47 % QGS cine images:  Hypokinesis of the inferior wall and septum  Findings Abnormal      Overall Impression  Exercise Capacity: Lexiscan with no exercise. BP Response: Normal blood pressure response. Clinical Symptoms: SOB ECG Impression: No significant ST segment change suggestive of ischemia. Overall Impression Comments: There is old scar in the inferior wall. There is no significant ischemia.  Appended Document: Cardiology Nuclear Testing No evidence of ischemia.  Inferior scar.  This is unchanged from report of previous stress test in 2009.  From this it does not appear that SOB is due to blood supply problem to heart.  Appended Document: Cardiology Nuclear Testing [facility] for call back.  Appended Document: Cardiology Nuclear Testing Pt. aware of results.

## 2010-06-29 ENCOUNTER — Telehealth: Payer: Self-pay | Admitting: Internal Medicine

## 2010-06-30 ENCOUNTER — Telehealth: Payer: Self-pay | Admitting: Internal Medicine

## 2010-07-05 NOTE — Progress Notes (Signed)
Summary: rtn called from [facility]   Phone Note Call from Patient Call back at [facility] Phone [phone]   Caller: Patient Reason for Call: Talk to Nurse Summary of Call: rtn called from [facility] Initial call taken by: Lorne Skeens,  June 29, 2010 9:52 AM  Follow-up for Phone Call        Pt. aware of nuclear stress test results. Pt. verbalized understanding. Follow-up by: Ollen Gross, RN, BSN,  June 29, 2010 9:56 AM

## 2010-07-05 NOTE — Progress Notes (Signed)
Summary: pt rtn call to get results   Phone Note Call from Patient Call back at Work Phone [phone] Call back at ext [phone]   Caller: Patient Reason for Call: Talk to Nurse, Talk to Doctor, Lab or Test Results Summary of Call: pt rtn call to get results Initial call taken by: Omer Jack,  June 30, 2010 4:06 PM  Follow-up for Phone Call        Called patient back to review stress test results.  Layne Benton, RN, BSN  June 30, 2010 4:42 PM

## 2010-07-06 LAB — GLUCOSE, CAPILLARY
Glucose-Capillary: 167 mg/dL — ABNORMAL HIGH (ref 70–99)
Glucose-Capillary: 214 mg/dL — ABNORMAL HIGH (ref 70–99)

## 2010-07-12 ENCOUNTER — Ambulatory Visit (INDEPENDENT_AMBULATORY_CARE_PROVIDER_SITE_OTHER): Payer: Medicare Other | Admitting: Internal Medicine

## 2010-07-12 DIAGNOSIS — E291 Testicular hypofunction: Secondary | ICD-10-CM

## 2010-08-03 ENCOUNTER — Other Ambulatory Visit: Payer: Self-pay | Admitting: Internal Medicine

## 2010-08-04 ENCOUNTER — Other Ambulatory Visit (INDEPENDENT_AMBULATORY_CARE_PROVIDER_SITE_OTHER): Payer: Medicare Other | Admitting: Internal Medicine

## 2010-08-04 DIAGNOSIS — E785 Hyperlipidemia, unspecified: Secondary | ICD-10-CM

## 2010-08-04 DIAGNOSIS — T887XXA Unspecified adverse effect of drug or medicament, initial encounter: Secondary | ICD-10-CM

## 2010-08-04 DIAGNOSIS — E349 Endocrine disorder, unspecified: Secondary | ICD-10-CM

## 2010-08-04 DIAGNOSIS — N489 Disorder of penis, unspecified: Secondary | ICD-10-CM

## 2010-08-04 DIAGNOSIS — E291 Testicular hypofunction: Secondary | ICD-10-CM

## 2010-08-04 LAB — BASIC METABOLIC PANEL
CO2: 27 mEq/L (ref 19–32)
Calcium: 9.5 mg/dL (ref 8.4–10.5)
Chloride: 105 mEq/L (ref 96–112)
Creatinine, Ser: 1 mg/dL (ref 0.4–1.5)
Glucose, Bld: 152 mg/dL — ABNORMAL HIGH (ref 70–99)
Sodium: 139 mEq/L (ref 135–145)

## 2010-08-04 LAB — HEPATIC FUNCTION PANEL
ALT: 34 U/L (ref 0–53)
Albumin: 3.9 g/dL (ref 3.5–5.2)
Alkaline Phosphatase: 43 U/L (ref 39–117)
Bilirubin, Direct: 0.1 mg/dL (ref 0.0–0.3)
Total Protein: 6.3 g/dL (ref 6.0–8.3)

## 2010-08-04 LAB — HEMOGLOBIN A1C: Hgb A1c MFr Bld: 7.2 % — ABNORMAL HIGH (ref 4.6–6.5)

## 2010-08-04 LAB — LIPID PANEL: Total CHOL/HDL Ratio: 4

## 2010-08-11 ENCOUNTER — Ambulatory Visit: Payer: Self-pay | Admitting: Internal Medicine

## 2010-08-24 ENCOUNTER — Ambulatory Visit (INDEPENDENT_AMBULATORY_CARE_PROVIDER_SITE_OTHER): Payer: Medicare Other | Admitting: Internal Medicine

## 2010-08-24 ENCOUNTER — Encounter: Payer: Self-pay | Admitting: Internal Medicine

## 2010-08-24 VITALS — BP 128/70 | HR 80 | Temp 98.4°F | Wt 183.0 lb

## 2010-08-24 DIAGNOSIS — E119 Type 2 diabetes mellitus without complications: Secondary | ICD-10-CM

## 2010-08-24 DIAGNOSIS — I25810 Atherosclerosis of coronary artery bypass graft(s) without angina pectoris: Secondary | ICD-10-CM

## 2010-08-24 MED ORDER — GLYBURIDE 2.5 MG PO TABS: 2.5000 mg | ORAL_TABLET | Freq: Every day | ORAL | Status: DC

## 2010-08-24 MED ORDER — METFORMIN HCL 1000 MG PO TABS: ORAL_TABLET | ORAL | Status: DC

## 2010-08-24 NOTE — Assessment & Plan Note (Signed)
No sxs Continue risk factor modification 

## 2010-08-24 NOTE — Assessment & Plan Note (Signed)
Some better control  discussed need for weight loss  See new meds

## 2010-08-24 NOTE — Progress Notes (Signed)
  Subjective:    Patient ID: Steven [name], male    DOB: 01-Jul-1930, 77 y.o.   MRN: [id number]  HPI   patient comes in for followup of multiple medical problems including type 2 diabetes, hyperlipidemia, hypertension. The patient does not check blood sugar or blood pressure at home. The patetient does not follow an exercise or diet program. The patient denies any polyuria, polydipsia.  In the past the patient has gone to diabetic treatment center. The patient is tolerating medications  Without difficulty. The patient does admit to medication compliance. Has had some hypoglycemia sxs late in the evening  Past Medical History  Diagnosis Date  . Diabetes mellitus     type II  . GERD (gastroesophageal reflux disease)   . Hyperlipidemia   . Anemia   . CAD (coronary artery disease) 2004    s/p inferior wall Mi 2004 with PTCA/Stent; s/p CABG 2004  . History of myocardial infarction    Past Surgical History  Procedure Date  . Coronary artery bypass graft 2004    LIMA to LAD; SVG to ramus intermedius; SVG to PDA/PLSA  . Tonsillectomy     reports that he has quit smoking. His smoking use included Pipe. He does not have any smokeless tobacco history on file. He reports that he drinks alcohol. He reports that he does not use illicit drugs. family history includes Cancer in his sister; Cirrhosis in his mother; Heart disease in his maternal grandfather; Hypertension in his father; and Stroke in his father, maternal grandmother, paternal grandfather, and paternal grandmother. No Known Allergies   Review of Systems     Objective:   Physical Exam        Assessment & Plan:

## 2010-08-30 ENCOUNTER — Other Ambulatory Visit: Payer: Self-pay | Admitting: *Deleted

## 2010-08-30 NOTE — Assessment & Plan Note (Signed)
[facility] HEALTHCARE                            CARDIOLOGY OFFICE NOTE   TAJAH, SCHREINER                       MRN:          [id number]  DATE:04/20/2008                            DOB:          March 29, 1931    IDENTIFICATION:  Mr. Knisley is a 77 year old gentleman who I saw last in  January 2009.  He has a history of CAD, dyslipidemia, and diabetes.   Since seen, he has done okay from a cardiac standpoint.  He denies chest  pain.  No shortness of breath.  His activity is limited some by his  knee, which has arthritis.  He would like to walk more.  He is still  working.   He is concerned about his medicines about his cholesterol.   CURRENT MEDICATIONS:  1. Fluticasone 2 sprays nightly.  2. Patanol eye drops.  3. Altace 10.  4. Diclofenac 75 b.i.d.  5. Pepcid 20 p.r.n. b.i.d.  6. Simvastatin 40.  7. Toprol-XL 25.  8. Metformin 500 b.i.d.   PHYSICAL EXAMINATION:  GENERAL:  The patient currently is in no  distress.  VITAL SIGNS:  Blood pressure 119/66, pulse is 63, and weight 187.  NECK:  JVP is normal.  No audible bruits.  LUNGS:  Clear.  CARDIAC:  Regular rate and rhythm, S1 and S2.  No S3.  No murmurs.  ABDOMEN:  Benign.  EXTREMITIES:  Good pulses.  No edema.   LABORATORY DATA:  Labs from Dr. Cato Mulligan' office, LDL of 91, HDL of 31,  triglycerides 409, hemoglobin A1c of 6.6.   Carotid Dopplers done on May 27, 2007, 40-59% right ICA stenosis and  0-39% left ICA stenosis.   IMPRESSION:  1. Coronary artery disease.  The patient is status post inferior wall      myocardial infarction in 2004, underwent percutaneous transluminal      coronary angioplasty/stent to the mid right coronary artery and      then coronary artery bypass graft.  For his residual disease,      clinically seems to be doing okay.  No evidence of active disease      or active ischemia.  I would continue on current medical therapy.  2. Dyslipidemia.  With his lower HDL and  diabetes, I would like to get      tighter control of his LDL.  I have switched him over to Crestor      10.  Followup lipid panel and AST in 8 weeks' time.  3. Diabetes.  Follow up with Dr. Cato Mulligan.  4. Cerebrovascular disease.  Followup in February for repeat carotid      Doppler.   Otherwise, I will set to see the patient back in 1 year, sooner if  problems develop.     Pricilla Riffle, MD, [facility]  Electronically Signed    PVR/MedQ  DD: 04/20/2008  DT: 04/21/2008  Job #: [id number]

## 2010-08-30 NOTE — Assessment & Plan Note (Signed)
[facility]ALTHC[facility]                            CARDIOLOGY OFFICE NOTE   TEMPLE, SPORER                       MRN:          [id number]  DATE:05/16/2007                            DOB:          02-Mar-1931    IDENTIFICATION:  [name] is a 77 year old gentleman who was previously  followed by Dr. Geralynn Rile.  He is also followed by  Dr. Birdie Sons.   The patient was last seen in cardiology clinic back in December 2007.  His cardiac history dates back to 2004.  He had an inferior wall MI.  Underwent PTCA/stent of the mid RCA and then went on to have CABG for  his residual disease.  LVEF was 44%.   Since seen he said there was a long spell where he was digging up plants  hurriedly and became quite winded.  This was late in the evening.  He  was trying to finish things before it got dark.  He had not had any  other episodes since or prior.  No other episodes of chest pain.  He  said at the time of his MI he really had no significant chest pain.  Otherwise, he takes activities as tolerated and notes no decline.   He does note intermittent cramping along the right side when he is  sitting and not when he is walking or standing.  It is right above the  knee on the lateral aspect.  He also has some numbness in his right  toes.   CURRENT MEDICATIONS:  1. Fluticasone 2 sprays q.h.s.  2. Patanol eye drops.  3. Multivitamin.  4. Aspirin 81.  5. Alpha lipoic b.i.d.  6. Multi-Oil.  7. Octavia Bruckner.  8. MSM.  9. Altace 10.  10.Simvastatin 40.  11.Pepcid 20.   REVIEW OF SYSTEMS:  Patient was taken off Niaspan.  He said his sugars  are running high.  He is due to have them rechecked in a few months.   PHYSICAL EXAMINATION:  GENERAL:  The patient is in no distress.  VITAL SIGNS:  Blood pressure 120/64, pulse 68, weight 189.  LUNGS:  Clear.  CARDIAC:  Regular rate and rhythm, S1, S2, no S3, no significant  murmurs.  ABDOMEN:  Benign.  EXTREMITIES:  No  edema.  NEUROLOGICAL:  Deferred.   12-lead EKG:  Normal sinus rhythm, possible inferior wall MI,  nonspecific ST wave changes, PVCs.   IMPRESSION:  1. Coronary artery disease.  He has atypical symptoms.  The day of his      myocardial infarction he really had just profuse sweating and      nausea.  I would go ahead and set him up for a Myoview.  I actually      cannot find that last catheterization report.  Will need to      correlate.  Continue medicines for now.  2. Dyslipidemia.  Will try to get fasting lipids from Dr. Cato Mulligan.  Not      on Niaspan any longer.  3. Right leg complaints.  They sound more neuromuscular.  He has good  distal pulses actually; I did not document in the physical.  For      asymptomatic carotid stenoses he should be due for follow-up      actually, and will make sure this is arranged.     Pricilla Riffle, MD, [facility]  Electronically Signed    PVR/MedQ  DD: 05/16/2007  DT: 05/17/2007  Job #: [id number]   cc:   Valetta Mole. Swords, MD

## 2010-09-02 ENCOUNTER — Emergency Department (HOSPITAL_COMMUNITY)
Admission: EM | Admit: 2010-09-02 | Discharge: 2010-09-02 | Disposition: A | Discharge status: Home / Self Care | Payer: Medicare Other | Attending: Emergency Medicine | Admitting: Emergency Medicine

## 2010-09-02 DIAGNOSIS — F411 Generalized anxiety disorder: Secondary | ICD-10-CM | POA: Insufficient documentation

## 2010-09-02 DIAGNOSIS — R040 Epistaxis: Secondary | ICD-10-CM | POA: Insufficient documentation

## 2010-09-02 DIAGNOSIS — E119 Type 2 diabetes mellitus without complications: Secondary | ICD-10-CM | POA: Insufficient documentation

## 2010-09-02 DIAGNOSIS — Z951 Presence of aortocoronary bypass graft: Secondary | ICD-10-CM | POA: Insufficient documentation

## 2010-09-02 DIAGNOSIS — Z7982 Long term (current) use of aspirin: Secondary | ICD-10-CM | POA: Insufficient documentation

## 2010-09-02 DIAGNOSIS — I10 Essential (primary) hypertension: Secondary | ICD-10-CM | POA: Insufficient documentation

## 2010-09-02 NOTE — H&P (Signed)
NAME:  Steven [name], MCZEAL NO.:  [id number]   MEDICAL RECORD NO.:  [id number]                   PATIENT TYPE:  EMS   LOCATION:  MAJO                                 FACILITY:  [facility]   PHYSICIAN:  Pricilla Riffle, M.D.                 DATE OF BIRTH:  10-31-1930   DATE OF ADMISSION:  11/06/2002  DATE OF DISCHARGE:                                HISTORY & PHYSICAL   HISTORY OF PRESENT ILLNESS:  The patient is a 77 year old gentleman with no  known CAD.  He had a cath in 1990 by his report that was reported normal.  He was doing well, no chest pain.  He had a [employer] burger at about 3:p.m.  At 3:30-3:45 he developed severe substernal chest pressure.  No shortness of  breath, 10/10.  He went to Dr. Claris Che office.  EKG was done that showed an  acute inferior wall MI.  He went to [facility].  Currently pain  is 5/10 on nitroglycerin 4 mg, morphine, also on heparin.  EKG again with  acute inferior wall MI.   ALLERGIES:  None.   MEDICATIONS:  1. Lipitor 40 mg a day.  2. Aciphex 20.  3. Vioxx 12.5.   PAST MEDICAL HISTORY:  1. Reflux.  2. Hyperlipidemia.  3. Arthritis.   SOCIAL HISTORY:  The patient smokes a pipe, does not drink.   FAMILY HISTORY:  Positive for CVAs, paternal grandfather with CAD.   REVIEW OF SYSTEMS:  Appetite okay.  PULMONARY:  Negative.  GASTROINTESTINAL:  As noted.  Kidney negative.  ENDOCRINE:  No diabetes, has actually low  sugars.  MUSCULOSKELETAL:  Arthritis.  BACK:  There was no edema, otherwise  all systems negative to above problem except as noted.   PHYSICAL EXAMINATION:  GENERAL:  Patient with moderate chest pain.  VITAL SIGNS:  Blood pressure 99 systolic, pulse 60s, respiratory rate 20.  NECK:  No bruits.  JVD normal.  LUNGS:  Clear to auscultation.  CARDIAC:  Regular rate and rhythm.  Normal S1, S2, positive S4.  ABDOMEN:  Supple.  No hepatomegaly.  EXTREMITIES:  2+ pulses, no edema.   LABORATORY DATA:  A 12  lead EKG shows normal sinus rhythm at a rate of 66  beats per minute.  Acute inferior wall MI with 4-6 mm ST elevation,  inferior, and ST depression, V2.    IMPRESSION:  A 77 year old male with acute inferior wall myocardial  infarction approximately three hours from start of pain.  Plan for cath,  PTCA.  Risks, benefits were explained.  The patient agreed to proceed.  Pricilla Riffle, M.D.    PVR/MEDQ  D:  11/06/2002  T:  11/06/2002  Job:  [id number]

## 2010-09-02 NOTE — Assessment & Plan Note (Signed)
[place] [facility]                            CARDIOLOGY OFFICE NOTE   DIAMANTE, RUBIN                       MRN:          [id number]  DATE:04/05/2006                            DOB:          1931-04-11    PRIMARY CARE PHYSICIAN:  Valetta Mole. Swords, MD.   HISTORY OF PRESENT ILLNESS:  Mr. Amick is a 77 year old gentleman who  suffered inferior myocardial infarction in July 2004. I placed a stent  in his mid right coronary artery. He subsequently underwent coronary  artery bypass grafting for his residual disease. His ejection fraction  is 44%. He continues to work and exercise regularly. He says he feels  great. He has had no chest discomfort, PND, orthopnea, edema,  palpitations, presyncope or syncope. He has no claudication.   CURRENT MEDICATIONS:  1. Toprol XL 25 mg per day.  2. Multivitamin.  3. Aspirin 81 mg per day.  4. Altace 10 mg per day.  5. Niaspan 1000 mg twice per day.  6. Simvastatin 40 mg per day.  7. Famotidine 20 mg per day.   ALLERGIES:  No known drug allergies.   PHYSICAL EXAMINATION:  GENERAL:  He is generally well-appearing in no  distress.  VITAL SIGNS:  Heart rate 75, blood pressure 104/62 and weight of 189  pounds. Weight is stable over the past year.  NECK:  He has no jugular venous distention, no thyromegaly and no  lymphadenopathy.  LUNGS:  Clear to auscultation.  CARDIAC:  He has a nondisplaced point of maximal cardiac impulse. There  is a regular rate and rhythm without murmurs, rubs or gallops.  ABDOMEN:  Soft, nondistended, nontender. There is no hepatosplenomegaly.  Bowel sounds are normal.  EXTREMITIES:  Warm without clubbing, cyanosis, edema or ulceration.  PULSES:  Carotid pulses are 2+ bilaterally with a soft bruit on the  right.   Electrocardiogram  demonstrates normal sinus rhythm and is a normal EKG.   IMPRESSION/RECOMMENDATIONS:  1. Coronary artery disease. Ejection fraction 44%. Doing well after  inferior myocardial infarction. Continue aspirin, beta blocker and      ACE inhibitor.  2. Hypercholesterolemia:  No recent check. Will therefore check today.  3. Asymptomatic carotid stenosis:  Due for a repeat ultrasound now.      Will schedule it.   DISPOSITION:  I will see him back in a year's time.     Salvadore Farber, MD  Electronically Signed    WED/MedQ  DD: 04/05/2006  DT: 04/06/2006  Job #: [id number]   cc:   Valetta Mole. Swords, MD

## 2010-09-02 NOTE — Cardiovascular Report (Signed)
NAME:  IZAAK, SAHR                          ACCOUNT NO.:  [id number]   MEDICAL RECORD NO.:  [id number]                   PATIENT TYPE:  INP   LOCATION:  2926                                 FACILITY:  [facility]   PHYSICIAN:  Salvadore Farber, M.D.             DATE OF BIRTH:  1931/02/13   DATE OF PROCEDURE:  11/06/2002  DATE OF DISCHARGE:                              CARDIAC CATHETERIZATION   PROCEDURE:  Left heart catheterization, left ventriculography, coronary  angiography, stent to the RCA.   INDICATION:  Mr. Auzenne is a 77 year old gentleman without prior cardiac  history. Risk factors notable for dyslipidemia and pipe smoking.  At 3 p.m.  he developed acute onset of  chest discomfort. He presented to his primary  care physician who diagnosed acute inferior  myocardial infarction and  referred him urgently to the [facility] Emergency Room.  He had ongoing chest  discomfort.  He was treated aspirin, heparin and referred for diagnostic  angiography with an eye to percutaneous reperfusion.   PROCEDURAL TECHNIQUE:  Informed consent was obtained.  The patient consented  to participate in the Flame trial of filter wire distal protection in acute  myocardial infarction.  Using 1% lidocaine local anesthesia, a 6 French  sheath was placed in the right femoral vein and a 7 French sheath in the  right femoral artery using the modified Seldinger technique.  Diagnostic  angiography and ventriculography were performed using JL-4, JR-4, and  pigtail catheters.  The pigtail catheter was then pulled back to the  thoracic aorta and aortography performed by power injection.  The case then  turned to intervention.   The patient had been heparinized in the emergency room.  Initial ACT was  found to be greater than 300 seconds.  Therefore 2b3a inhibitor was not  administered.  The artery was completely occluded.  A 6 [place] JR-4 guide  was advanced over wire and engaged in the ostium of the right  coronary  artery.  A Luge wire was passed without difficulty beyond the lesion into  the PLV.  This did not result in reperfusion.  Therefore, the rescue  catheter was used to aspirate debris from the vessel.  This resulted in  reperfusion.  Following reperfusion, the patient developed bradycardia to  approximately 40 beats per minute responsive to Atropine. He also developed  hypotension which was not responsive to Atropine and was treated with  transient administration of dopamine.  The distal vessel was seen to be  approximately 3 mm in diameter in both the PLV and PDA.  The filter wire was  manipulated across the lesion and positioned in the PDA as this was felt to  be the larger of the two territories.  Due to the location of the stenosis,  both territories could not be protected with the filter as it would place  the filter too close to the lesion to allow deployment of  the stent.  The  Luge wire was removed and the filter wire deployed.  Apposition was  confirmed in two orthogonal views.  The lesion was then stented using a 4.0  x 15-mm Driver MX stent deployed at 14 atmospheres.  This resulted in no  residual stenosis and TIMI-3 flow to the distal vessel.  Blush appeared to  remain impaired.  There was a slight filling defect on the superior margin  of the stent consistent with residual thrombus.  ACT was checked and found  to be less than 300 seconds.  Therefore eptifibatide double bolus was  administered.  Plavix was administered during the case (300 mg).   COMPLICATIONS:  Transient heart block treated with Atropine.  Transient  hypotension treated with dopamine.  Both had resolved at completion of the  case.   PAIN ONSET:  1500,  ER arrival 1740, reperfusion 1914.   FINDINGS:  1. LV:  98/9/22.  EF 44% with inferior and apical akinesis.  2. No aortic stenosis or mitral regurgitation.  3. Left main:  Angiographically normal.  4. LAD:  The LAD is a moderate size vessel giving  rise to a single diagonal     branch.  There is a 60% stenosis of the proximal vessel and an 80%     stenosis of the mid vessel just after the takeoff of a large septal     perforator.  5. Ramus intermedius:  Moderate size vessel with an 80% stenosis in its mid     segment.  6. Circumflex:  Moderate size vessel giving rise to a single obtuse     marginal.  There is no significant disease.  7. RCA:  The RCA was occluded in its mid section. As described in detail     above, stenting using filter wire distal protection resulted in no     residual stenosis and TIMI-3 flow.  8. Thoracic an abdominal aortography demonstrated and infrarenal abdominal     aortic aneurysm which appears to be small by angiogram.  However,     angiography may under estimate the size of the aneurysm.   IMPRESSION/RECOMMENDATIONS:  1. Successful stenting of the occluded right coronary artery resulting in no     residual stenosis and TIMI-3 flow.  Ejection fraction is 44%.  The     patient has residual multivessel disease in the left system.     Incidentally, noted is an abdominal aortic aneurysm.   Recommended coronary artery bypass grafting after recovery from myocardial  infarction.  Will obtain ultrasound to assess the size of his abdominal  aortic aneurysm.  Plavix will be continued two weeks unless coronary artery  bypass graft is performed sooner.  Aspirin will be continued indefinitely.  Beta blocker was initiated in the lab an will be continued. ACE inhibitor  will be initiated if blood pressure allows.                                                 Salvadore Farber, M.D.    WED/MEDQ  D:  11/06/2002  T:  11/07/2002  Job:  [id number]  Valetta Mole. Swords, M.D. [facility]   Jeannett Senior A. Clent Ridges, M.D. [facility]   cc:   Valetta Mole. Swords, M.D. [facility]   Jeannett Senior A. Clent Ridges, M.D. [facility]

## 2010-09-02 NOTE — Op Note (Signed)
NAME:  Steven [name], Steven [name]                          ACCOUNT NO.:  [id number]   MEDICAL RECORD NO.:  [id number]                   PATIENT TYPE:  INP   LOCATION:  2310                                 FACILITY:  [facility]   PHYSICIAN:  Evelene Croon, M.D.                  DATE OF BIRTH:  [birth year]   DATE OF PROCEDURE:  11/14/2002  DATE OF DISCHARGE:                                 OPERATIVE REPORT   PREOPERATIVE DIAGNOSIS:  Three-vessel coronary artery disease, status post  acute inferior myocardial infarction.   POSTOPERATIVE DIAGNOSIS:  Three-vessel coronary artery disease, status post  acute inferior myocardial infarction.   PROCEDURES:  1. Median sternotomy.  2. Extracorporeal circulation.  3. Coronary artery bypass graft surgery x4 using a left internal mammary     artery graft to the left anterior descending coronary artery, with a     saphenous vein graft to the intermediate coronary artery and a sequential     saphenous vein graft to the posterior descending and posterolateral     branches of the right coronary artery.  4. Endoscopic vein harvesting from the right leg.   ATTENDING SURGEON:  Evelene Croon, M.D.   ASSISTANT:  Rowe Clack, P.A.-C.   ANESTHESIA:  General anesthesia.   CLINICAL HISTORY:  This patient is a 77 year old gentleman with no known  history of coronary disease, who was admitted on November 06, 2002, after  developing acute substernal chest pressure.  Electrocardiogram showed acute  inferior MI.  He was taken to the catheterization lab emergently, where  acute occlusion of the right coronary artery was found.  This was opened and  stented with an excellent result of 0% residual stenosis and TIMI-3 flow.  The patient also had about 60% proximal LAD stenosis and about 80% proximal  to mid-LAD stenosis.  There was an intermediate branch with 80% stenosis.  The left circumflex gave off a medium-sized marginal branch that had  insignificant disease in it.  Left  ventricular ejection fraction was about  44% with inferior and apical akinesis.  There was no gradient across the  aortic valve and no mitral regurgitation.  Post catheterization the patient  had some shortness of breath and fever as well as mild leukocytosis and  chest x-ray showing possible bilateral bronchopneumonia.  A CT scan ruled  out pulmonary embolism.  In retrospect, this was more likely to be due to  some congestive heart failure.  The patient never had any sputum production,  and his lungs remained relatively clear.  He gradually improved.  It was  felt that coronary artery bypass graft surgery was the best treatment to  prevent further ischemia and infarction.  I discussed the operative  procedure with him, including alternatives, benefits, and risks, including  bleeding, blood transfusion, infection, stroke, myocardial infarction, and  death.  They understood and agreed to proceed.   OPERATIVE PROCEDURE:  The  patient was taken to the operating room and placed  on the table in the supine position.  After induction of general  endotracheal anesthesia, a Foley catheter was placed in the bladder using a  sterile technique.  Then the chest, abdomen, and both lower extremities were  prepped and draped in the usual sterile manner.  The chest was entered  through a median sternotomy incision and the pericardium opened in the  midline.  There was a large amount of pericardial fluid, which was clear.  Examination of the heart showed mild hypokinesis of the right ventricle.  The ascending aorta had no palpable plaques in it.   Then the left internal mammary artery was harvested from the chest wall as a  pedicle graft.  This was a medium-caliber vessel with excellent blood flow  through it.  At the same time a segment of greater saphenous vein was  harvested from the right leg.  This vein was of medium caliber and good  quality.   Then the patient was heparinized and when an adequate  activated clotting  time was achieved, the distal ascending aorta was cannulated using a 20  French aortic cannula for arterial inflow.  Venous outflow was achieved  using a two-stage venous cannula through the right atrial appendage.  An  antegrade cardioplegia and vent cannula was inserted in the aortic root.   The patient was placed on cardiopulmonary bypass and the distal coronary  arteries identified.  The patient had extensive inferior MI.  There was  ecchymosis present throughout the inferior wall, and this area was very  inflamed and friable.  This likely accounted for his preoperative fevers.  The LAD was a large vessel that had proximal disease in it but distally it  was fairly free of disease.  The intermediate was intramyocardial.  It was a  large, graftable vessel.  The posterior descending and posterolateral  branches were both medium-sized vessels that had mild proximal disease in  them.   Then the aorta was crossclamped and 500 mL of cold blood antegrade  cardioplegia was administered in the aortic root with quick arrest of the  heart.  Systemic hypothermia to 20 degrees Centigrade and topical  hypothermia with iced saline was used.  A temperature probe was placed in  the septum and an insulating pad in the pericardium.   The first distal anastomosis was performed to the posterior descending  coronary artery.  The internal diameter was 1.6 mm.  The conduit used was a  segment of greater saphenous vein and the anastomosis performed in a  sequential side-to-side manner using continuous 7-0 Prolene suture.  Flow  was measured through the graft and was excellent.   The second distal anastomosis was performed to the posterolateral branch.  The internal diameter of this vessel was 1.6 mm.  The conduit used was the  same segment of greater saphenous vein and the anastomosis performed in a  sequential end-to-side manner using continuous 7-0 Prolene suture.  Flow was measured  through the graft and was excellent.  Then another dose of  cardioplegia was given.   The third distal anastomosis was performed to the intermediate coronary  artery.  The internal diameter of this vessel was 1.75 mm.  The conduit used  was a second segment of greater saphenous vein and the anastomosis performed  in an end-to-side manner using continuous 7-0 Prolene suture.  Flow was  measured through the graft and was excellent.   The fourth distal anastomosis  was performed to the midportion of the left  anterior descending coronary artery.  The internal diameter of this vessel  was about 2.5 mm.  The conduit used was the left internal mammary artery  graft, and this was brought through an opening in the left pericardium  anterior to the phrenic nerve.  It was anastomosed to the LAD in an end-to-  side manner using continuous 8-0 Prolene suture.  The pedicle was tacked to  the epicardium with 6-0 Prolene sutures.  The patient was rewarmed to 37  degrees Centigrade.  With the crossclamp in place the two proximal vein  graft anastomoses were performed to the aortic root in an end-to-side manner  using continuous 6-0 Prolene suture.  Then the clamp was removed from the  mammary pedicle.  There was rapid warming of the ventricular septum and  return of spontaneous ventricular fibrillation.  The crossclamp was removed  with a time of 70 minutes, and the patient spontaneously converted to sinus  rhythm.   The proximal and distal anastomoses appeared hemostatic and the alignment of  the grafts satisfactory.  Graft markers were placed around the proximal  anastomoses.  Two temporary right ventricular and right atrial pacing wires  were placed and brought out through the skin.   When the patient had rewarmed to 37 degrees Centigrade, he was weaned from  cardiopulmonary bypass on no inotropic agents.  Total cardiopulmonary bypass  time was 88 minutes.  Cardiac function appeared good with a  cardiac output  of 5 L/min.  Protamine was given and the venous and aortic cannulas were  removed without difficulty.  Hemostasis was achieved.  Three chest tubes  were placed with a tube in the posterior pericardium, one in the left  pleural space, and one in the anterior mediastinum.  The pericardium was  reapproximated over the heart.  The sternum was closed with #6 stainless  steel wires.  The fascia was closed with continuous #1 Vicryl suture.  The  subcutaneous tissue was closed with continuous 2-0 Vicryl and the skin with  3-0 Vicryl subcuticular closure.  The lower extremity vein harvest site was  closed in layers in a similar manner.  The sponge, needle, and instrument  counts were correct according to the scrub nurse.  Dry sterile dressings  were applied over the incisions, around the chest tubes, which were hooked  to Pleuravac suction.  The patient remained hemodynamically stable and was  transported to the SICU in guarded but stable condition.                                              Evelene Croon, M.D.    BB/MEDQ  D:  11/14/2002  T:  11/15/2002  Job:  [id number]   cc:   Salvadore Farber, M.D.   [facility]

## 2010-09-02 NOTE — Consult Note (Signed)
NAME:  Steven [name], Steven [name]                          ACCOUNT NO.:  [id number]   MEDICAL RECORD NO.:  [id number]                   PATIENT TYPE:  INP   LOCATION:  2931                                 FACILITY:  [facility]   PHYSICIAN:  Evelene Croon, M.D.                  DATE OF BIRTH:  [birth year]   DATE OF CONSULTATION:  11/11/2002  DATE OF DISCHARGE:                                   CONSULTATION   REFERRING PHYSICIAN:  Salvadore Farber, M.D.   REASON FOR CONSULTATION:  Three-vessel coronary artery disease status post  acute inferior myocardial infarction.   HISTORY OF PRESENT ILLNESS:  This patient is a 77 year old gentleman with no  known history of coronary disease who was admitted on November 06, 2002 after  developing acute substernal chest pressure after eating a [employer] hamburger  about 3 p.m. He described this as 10/10. He went to his primary physician,  Dr. Claris Che office, where an electrocardiogram showed an acute inferior MI. He  went to [facility] emergency room where an electrocardiogram again showed an  acute inferior MI. He is taking to the catheterization lab emergently.  Catheterization showed acute occlusion of the right coronary artery which  was stented with excellent result with 0% residual stenosis and TIMI-III  flow. The patient also had 60% proximal LAD stenosis and 80% proximal mid  LAD stenosis. There is an intermediate at 80% stenosis. The left circumflex  had insignificant disease. The left ventricular ejection fraction was 44%  with inferior and apical akinesis. There was no gradient across the aortic  valve and no mitral regurgitation. There is suggestion of an infrarenal  abdominal aortic aneurysm. Post procedure, the patient developed some  shortness of breath as well as fever and mild leukocytosis and was diagnosed  with possible bilateral bronchopneumonia on chest x-ray and CT scan which  was done to rule out pulmonary embolism. There was no evidence of  pulmonary  embolism. He was treated with intravenous antibiotics. He never had any  cough or sputum production and no culture could be taken. He did have blood  cultures and urine cultures which were negative. The patient continued to  have fevers and has had some fever today up to a max of 100.7. He remains  pain free.   PAST MEDICAL HISTORY:  1. Possible gastroesophageal reflux.  2. History of hyperlipidemia.  3. Arthritis.   MEDICATIONS PRIOR TO ADMISSION:  1. Lipitor 40 mg q.d.  2. Aciphex 20 mg q.d.  3. Vioxx 12.5 mg q.d.   REVIEW OF SYSTEMS:  ENDOCRINE:  He denies diabetes and hypothyroidism. EYES:  Negative. ENT:  Negative. CARDIOVASCULAR:  He has had occasional short lived  left sided chest pains which he thought were due to reflux and heartburn. He  denies any shortness of breath. His energy level has been somewhat decreased  over the past few months. RESPIRATORY:  He  denies cough and sputum  production. GASTROINTESTINAL:  He had no nausea or vomiting. He denies  melena and bright red blood per rectum. Does have what he thinks have been  reflux. GENITOURINARY:  He denies dysuria and hematuria. MUSCULOSKELETAL:  He has arthritis. ALLERGIES:  None. NEUROLOGICAL:  He has never had a TIA or  a stroke. He denies any dizziness or syncope. He has had no focal weakness  or numbness.   FAMILY HISTORY:  Positive for coronary disease.   SOCIAL HISTORY:  He has never smoked cigarettes. He does smoke a pipe and  does not drink alcohol. He is married. He continues to work for a [employer] company. He remains active working in his yard.   PHYSICAL EXAMINATION:  VITAL SIGNS:  His blood pressure is 100/60, and pulse  is 60 and regular. Respiratory rate is 16 and unlabored.  GENERAL:  He is a well-developed white male in no distress.  HEENT:  Shows him to be normocephalic, atraumatic. Pupils are equal, round,  and reactive to light and accommodation. Extraocular movements are  intact.  His throat is clear.  NECK:  Shows normal carotid pulses bilaterally. There are no bruits. There  was no adenopathy or thyromegaly.  CARDIAC:  Shows a regular rate and rhythm with a normal S1 and S2. There are  no murmurs, rubs, or gallops.  LUNGS:  Clear.  ABDOMEN:  Shows active bowel sounds. His abdomen is soft and nontender.  There are no palpable masses or organomegaly.  EXTREMITIES:  Shows no peripheral edema. Pedal pulses are palpable  bilaterally.  SKIN:  Warm and dry.   LABORATORY DATA:  Chest x-ray on July 25 shows bilateral bronchopneumonia,  right greater than left, which was unchanged from previous chest x-ray.  Electrocardiogram from November 09, 2002 shows normal sinus rhythm with inferior  infarct. There is low voltage QRS. Laboratory examination on July 26 shows a  white blood cell count of 9.4 with a hemoglobin of 11.1, platelet count of  147,000. His coagulation profile has been normal. Electrolytes have been  normal with a mildly elevated glucose of 121. His glucose was elevated at  306 on admission. BUN is 14, creatinine of 1.2. Liver function profile was  normal. Hemoglobin A1c is 6.2. His CPK peaked at 3,381 with a MB of 276.  Urinalysis was negative.   SUMMARY:  In summary, Mr. Grove has three-vessel coronary artery disease  status post acute inferior MI secondary to occluded right coronary artery.  This was treated successfully with acute PTCA and stenting. I agree that  coronary artery bypass graft surgery is the best treatment for his residual  coronary disease. I would like to assure that he is recovering from his  pneumonia before proceeding with surgery. We will repeat his chest x-ray  tomorrow. His white blood cell count is normal, and his temperature curve  appears to be improving. He has had no sputum production or cough, and his  lungs sound clear on exam. We will tentatively plan on proceeding with coronary artery bypass surgery on Friday, November 14, 2002. I discussed the  operative procedure with he and his wife including alternatives, benefits,  and risks including bleeding, blood transfusion, infection, stroke,  myocardial infarction. He understands and will like to proceed with surgery.  Evelene Croon, M.D.    BB/MEDQ  D:  11/11/2002  T:  11/12/2002  Job:  [id number]

## 2010-09-03 ENCOUNTER — Emergency Department (HOSPITAL_COMMUNITY)
Admission: EM | Admit: 2010-09-03 | Discharge: 2010-09-03 | Disposition: A | Discharge status: Home / Self Care | Payer: Medicare Other | Attending: Emergency Medicine | Admitting: Emergency Medicine

## 2010-09-03 DIAGNOSIS — E119 Type 2 diabetes mellitus without complications: Secondary | ICD-10-CM | POA: Insufficient documentation

## 2010-09-03 DIAGNOSIS — Z09 Encounter for follow-up examination after completed treatment for conditions other than malignant neoplasm: Secondary | ICD-10-CM | POA: Insufficient documentation

## 2010-09-03 DIAGNOSIS — I10 Essential (primary) hypertension: Secondary | ICD-10-CM | POA: Insufficient documentation

## 2010-09-03 DIAGNOSIS — R040 Epistaxis: Secondary | ICD-10-CM | POA: Insufficient documentation

## 2010-09-03 DIAGNOSIS — R0682 Tachypnea, not elsewhere classified: Secondary | ICD-10-CM | POA: Insufficient documentation

## 2010-09-05 ENCOUNTER — Telehealth: Payer: Self-pay | Admitting: *Deleted

## 2010-09-05 NOTE — Telephone Encounter (Signed)
Pt went to the ER for a nose bleed.  He said he lost a lot of blood and he called to see if he needed a blood transfusion.  Told pt if he needed one then he would gotten one while he was ED.  Wanted to know if he needed to anything else?

## 2010-09-05 NOTE — Telephone Encounter (Signed)
Have POCT hgb performed here this week

## 2010-09-06 NOTE — Telephone Encounter (Signed)
LMTCB

## 2010-09-06 NOTE — Telephone Encounter (Signed)
Pt aware.

## 2010-09-08 ENCOUNTER — Ambulatory Visit (INDEPENDENT_AMBULATORY_CARE_PROVIDER_SITE_OTHER): Payer: Medicare Other | Admitting: Internal Medicine

## 2010-09-08 DIAGNOSIS — D649 Anemia, unspecified: Secondary | ICD-10-CM

## 2010-09-08 DIAGNOSIS — E291 Testicular hypofunction: Secondary | ICD-10-CM

## 2010-09-08 DIAGNOSIS — E119 Type 2 diabetes mellitus without complications: Secondary | ICD-10-CM

## 2010-09-15 ENCOUNTER — Other Ambulatory Visit: Payer: Self-pay | Admitting: *Deleted

## 2010-09-15 MED ORDER — FLUTICASONE FUROATE 27.5 MCG/SPRAY NA SUSP: 2.0000 | Freq: Every day | NASAL | Status: DC

## 2010-09-23 ENCOUNTER — Emergency Department (HOSPITAL_COMMUNITY)
Admission: EM | Admit: 2010-09-23 | Discharge: 2010-09-23 | Disposition: A | Discharge status: Home / Self Care | Payer: Medicare Other | Attending: Emergency Medicine | Admitting: Emergency Medicine

## 2010-09-23 DIAGNOSIS — I10 Essential (primary) hypertension: Secondary | ICD-10-CM | POA: Insufficient documentation

## 2010-09-23 DIAGNOSIS — I25810 Atherosclerosis of coronary artery bypass graft(s) without angina pectoris: Secondary | ICD-10-CM | POA: Insufficient documentation

## 2010-09-23 DIAGNOSIS — R040 Epistaxis: Secondary | ICD-10-CM | POA: Insufficient documentation

## 2010-09-23 DIAGNOSIS — E119 Type 2 diabetes mellitus without complications: Secondary | ICD-10-CM | POA: Insufficient documentation

## 2010-09-23 LAB — GLUCOSE, CAPILLARY

## 2010-09-25 ENCOUNTER — Emergency Department (HOSPITAL_COMMUNITY)
Admission: EM | Admit: 2010-09-25 | Discharge: 2010-09-25 | Disposition: A | Discharge status: Home / Self Care | Payer: Medicare Other | Attending: Emergency Medicine | Admitting: Emergency Medicine

## 2010-09-25 DIAGNOSIS — I25810 Atherosclerosis of coronary artery bypass graft(s) without angina pectoris: Secondary | ICD-10-CM | POA: Insufficient documentation

## 2010-09-25 DIAGNOSIS — E119 Type 2 diabetes mellitus without complications: Secondary | ICD-10-CM | POA: Insufficient documentation

## 2010-09-25 DIAGNOSIS — I10 Essential (primary) hypertension: Secondary | ICD-10-CM | POA: Insufficient documentation

## 2010-09-25 DIAGNOSIS — R040 Epistaxis: Secondary | ICD-10-CM | POA: Insufficient documentation

## 2010-09-25 LAB — POCT I-STAT, CHEM 8
Calcium, Ion: 1.3 mmol/L (ref 1.12–1.32)
Creatinine, Ser: 1.1 mg/dL (ref 0.4–1.5)
Glucose, Bld: 241 mg/dL — ABNORMAL HIGH (ref 70–99)
Hemoglobin: 12.6 g/dL — ABNORMAL LOW (ref 13.0–17.0)
Potassium: 4.5 mEq/L (ref 3.5–5.1)

## 2010-09-25 LAB — PROTIME-INR: INR: 1.02 (ref 0.00–1.49)

## 2010-09-25 LAB — CBC
HCT: 35.6 % — ABNORMAL LOW (ref 39.0–52.0)
Hemoglobin: 12.1 g/dL — ABNORMAL LOW (ref 13.0–17.0)
MCHC: 34 g/dL (ref 30.0–36.0)
MCV: 92 fL (ref 78.0–100.0)
RDW: 13.2 % (ref 11.5–15.5)

## 2010-09-26 LAB — GLUCOSE, CAPILLARY: Glucose-Capillary: 278 mg/dL — ABNORMAL HIGH (ref 70–99)

## 2010-10-03 ENCOUNTER — Other Ambulatory Visit (INDEPENDENT_AMBULATORY_CARE_PROVIDER_SITE_OTHER): Payer: Self-pay | Admitting: Otolaryngology

## 2010-10-03 ENCOUNTER — Ambulatory Visit
Admission: RE | Admit: 2010-10-03 | Discharge: 2010-10-03 | Disposition: A | Discharge status: Home / Self Care | Payer: Medicare Other | Source: Ambulatory Visit | Attending: Otolaryngology | Admitting: Otolaryngology

## 2010-10-03 ENCOUNTER — Encounter (HOSPITAL_BASED_OUTPATIENT_CLINIC_OR_DEPARTMENT_OTHER)
Admission: RE | Admit: 2010-10-03 | Discharge: 2010-10-03 | Disposition: A | Discharge status: Home / Self Care | Payer: Medicare Other | Source: Ambulatory Visit | Attending: Otolaryngology | Admitting: Otolaryngology

## 2010-10-03 DIAGNOSIS — Z01811 Encounter for preprocedural respiratory examination: Secondary | ICD-10-CM

## 2010-10-03 LAB — POCT I-STAT, CHEM 8
BUN: 38 mg/dL — ABNORMAL HIGH (ref 6–23)
Chloride: 108 mEq/L (ref 96–112)
Creatinine, Ser: 0.9 mg/dL (ref 0.50–1.35)
Sodium: 138 mEq/L (ref 135–145)

## 2010-10-04 ENCOUNTER — Ambulatory Visit (HOSPITAL_BASED_OUTPATIENT_CLINIC_OR_DEPARTMENT_OTHER)
Admission: RE | Admit: 2010-10-04 | Discharge: 2010-10-04 | Disposition: A | Discharge status: Home / Self Care | Payer: Medicare Other | Source: Ambulatory Visit | Attending: Otolaryngology | Admitting: Otolaryngology

## 2010-10-04 DIAGNOSIS — Z01812 Encounter for preprocedural laboratory examination: Secondary | ICD-10-CM | POA: Insufficient documentation

## 2010-10-04 DIAGNOSIS — K219 Gastro-esophageal reflux disease without esophagitis: Secondary | ICD-10-CM | POA: Insufficient documentation

## 2010-10-04 DIAGNOSIS — Z01818 Encounter for other preprocedural examination: Secondary | ICD-10-CM | POA: Insufficient documentation

## 2010-10-04 DIAGNOSIS — Z951 Presence of aortocoronary bypass graft: Secondary | ICD-10-CM | POA: Insufficient documentation

## 2010-10-04 DIAGNOSIS — R040 Epistaxis: Secondary | ICD-10-CM | POA: Insufficient documentation

## 2010-10-04 DIAGNOSIS — E119 Type 2 diabetes mellitus without complications: Secondary | ICD-10-CM | POA: Insufficient documentation

## 2010-10-04 DIAGNOSIS — I10 Essential (primary) hypertension: Secondary | ICD-10-CM | POA: Insufficient documentation

## 2010-10-04 DIAGNOSIS — I2510 Atherosclerotic heart disease of native coronary artery without angina pectoris: Secondary | ICD-10-CM | POA: Insufficient documentation

## 2010-10-04 DIAGNOSIS — J4489 Other specified chronic obstructive pulmonary disease: Secondary | ICD-10-CM | POA: Insufficient documentation

## 2010-10-04 DIAGNOSIS — J449 Chronic obstructive pulmonary disease, unspecified: Secondary | ICD-10-CM | POA: Insufficient documentation

## 2010-10-04 LAB — GLUCOSE, CAPILLARY
Glucose-Capillary: 238 mg/dL — ABNORMAL HIGH (ref 70–99)
Glucose-Capillary: 209 mg/dL — ABNORMAL HIGH (ref 70–99)

## 2010-10-04 LAB — POCT HEMOGLOBIN-HEMACUE: Hemoglobin: 13.5 g/dL (ref 13.0–17.0)

## 2010-10-10 NOTE — Consult Note (Signed)
NAME:  Steven [name], Steven [name] NO.:  [id number]  MEDICAL RECORD NO.:  [id number]  LOCATION:  WLED                         FACILITY:  [facility]  PHYSICIAN:  Hermelinda Medicus, M.D.   DATE OF BIRTH:  14-Mar-1931  DATE OF CONSULTATION:  09/25/2010 DATE OF DISCHARGE:                                CONSULTATION   HISTORY OF PRESENT ILLNESS:  This patient is a 77 year old male who had severe epistaxis on the right side on Friday and had a Rhino rocket placed when he entered the emergency room, his wife stated that he had trouble controlling this and they felt he had a posterior nosebleed.  He apparently has not had a history of epistaxis or is not a aspirin or Plavix or Coumadin though he has had open heart surgery approximately 7 years ago after he had a heart attack.  He was apparently doing some yard work and was somewhat more active when this occurred, so he was taken the emergency room, had the posterior and anterior packs placed which was a Rhino rocket and did reasonably well until this morning when he had some bleeding coming from his right side.  He had bleeding from his right side once again and also the left side.  So both packs were removed and I examined the patient and he had some evidence of epistaxis on both sides, more anterior on the right and posterior and anterior on the left.  I cauterized each side, all three areas, using silver nitrate.  There was no active bleeding when I saw him but just saw the bleeding points but however I did place an 8-cm Merocel pack in the left side as this appeared to be more of a concern.  He will be seeing me in the office if there are any further problems tomorrow or Dr. Suszanne Conners in the office on Tuesday.  MEDICATION:  He has not taken Bayer aspirin but he was taking 81 mg, multivitamins, Toprol XL 25, glyburide 2.5 once a day, Ramipril 10 once a day, diclofenac 75 mg twice a day, metformin 1000 twice a day and omeprazole once a  day.  His white count is 7.2, hematocrit is 35.6, hemoglobin 12.1.  Sodium 136, potassium 4.5, chloride 103, glucose 241 when he has had apparently one container of Boost and a very small breakfast this morning.  His platelet count is 184.  Blood pressure 164/93, heart rate 78, respirations 20.  So he comes to a point where I think he needs to have his blood sugars further checked, possibly increasing his glyburide.  Also the blood pressure appears to be somewhat elevated, he may need his blood pressure medications adjusted as well.  He states he was not upset, aggravated, does not have labile hypertension and he felt he has been in pretty good condition is terms of his medical health.  His further examination reveals his ears, he does use Q-tips, he has pushed some cerumen into his ear canal somewhat more further than he should.  His oral cavity is clear.  He shows no evidence of infection.  He shows no evidence of any sinusitis or postnasal drainage.  On examination of his nose,  we could see right back in his  nasopharynx using the light and speculum and he has no evidence of any malignancy, polyps or allergy or sinusitis.  We will have him discharged from the emergency room and see if he can be careful and quiet and see if this will heal.  DIAGNOSIS:  Left anterior posterior nose bleed, anterior-posterior epistaxis, history of right epistaxis, history of packing on the right side and now cauterization and packing on the left.          ______________________________ Hermelinda Medicus, M.D.     JC/MEDQ  D:  09/25/2010  T:  09/25/2010  Job:  [id number]  cc:   Newman Pies, MD Fax: [phone]  Valetta Mole. Swords, MD [address] [place] [address]  Electronically Signed by Hermelinda Medicus M.D. on 10/10/2010 07:57:26 AM

## 2010-11-08 NOTE — Op Note (Signed)
Steven [name], Steven NO.:  [id number]  MEDICAL RECORD NO.:  [id number]  LOCATION:                                 FACILITY:  PHYSICIAN:  Newman Pies, MD            DATE OF BIRTH:  06/20/1930  DATE OF PROCEDURE:  10/04/2010 DATE OF DISCHARGE:                              OPERATIVE REPORT   SURGEON:  Newman Pies, MD  PREOPERATIVE DIAGNOSIS:  Recurrent bilateral epistaxis, worse on the right side.  POSTOPERATIVE DIAGNOSIS:  Recurrent bilateral epistaxis, worse on the right side.  PROCEDURE PERFORMED:  Bilateral endoscopic nasal cautery to control nasal hemorrhage.  ANESTHESIA:  General endotracheal tube anesthesia.  COMPLICATIONS:  None.  ESTIMATED BLOOD LOSS:  Less than 10 mL.  INDICATIONS FOR PROCEDURE:  The patient is a 77 year old male with a history of recurrent right epistaxis.  He previously underwent multiple nasal cautery in the office.  Approximately 2 weeks ago, the patient experienced three episodes of severe nasal epistaxis.  He was seen in the emergency room on two separate occasions.  Initially, his right nasal cavity was packed by the ER physician.  He was subsequently noted to have bleeding from the left side.  The left nasal cavity was also packed by Dr. Haroldine Laws.  Both packings have since removed.  On examination, he was noted to have hypervascular area at the right mid nasal septum.  Based on the above findings, the decision was made for the patient to undergo endoscopic evaluation of both nasal cavities and possible cautery of the bleeding sources.  The risks, benefits, alternatives, and details of the procedure were discussed with the patient and his wife.  Questions were invited and answered.  Informed consent was obtained.  DESCRIPTION:  The patient was taken to the operating room and placed supine on the operating table.  General endotracheal tube anesthesia was administered by the anesthesiologist.  Pledgets soaked with Afrin  were placed in both nasal cavities for vasoconstriction.  Both pledgets were subsequently removed.  Endoscopic evaluation of the nasal cavities revealed a large hypervascular area at the right mid nasal septum.  A significant amount of crusting was noted over the hypervascular area. The crusting was carefully removed with a combination of alligator forceps and suction catheter.  After the crusting removal, active profuse bleeding was noted on the right mid nasal septum.  The bleeding area was extensively cauterized with electrocautery.  Good hemostasis was achieved.  The 0-degree scope was then withdrawn and reinserted into the left nasal cavity.  Two smaller hypervascular areas were noted at the left posterior nasal septum.  The hypervascular areas were subsequently cauterized with suction electrocautery device under visual guidance of the 0-degree endoscope.  That concluded procedure for the patient.  The care of the patient was turned over to the anesthesiologist.  The patient was awakened from anesthesia without difficulty.  He was transferred to the recovery room in good condition.  OPERATIVE FINDINGS:  A large right mid nasal septal hypervascular area was noted.  Active bleeding was noted after his nasal crusting was removed.  Two smaller hypervascular areas were noted at the left posterior nasal  septum.  All bleeding sources were cauterized with electrocautery device.  SPECIMEN:  None.  FOLLOWUP CARE:  The patient will be discharged home once he is awake and alert.  He will follow up in my office in approximately 2 weeks.     Newman Pies, MD     ST/MEDQ  D:  10/04/2010  T:  10/05/2010  Job:  [id number]  cc:   Valetta Mole. Swords, MD  Electronically Signed by Newman Pies MD on 11/08/2010 10:36:28 AM

## 2010-12-09 ENCOUNTER — Other Ambulatory Visit: Payer: Self-pay

## 2010-12-09 MED ORDER — TESTOSTERONE CYPIONATE 200 MG/ML IM SOLN: 200.0000 mg | INTRAMUSCULAR | Status: DC

## 2010-12-09 NOTE — Telephone Encounter (Signed)
Faxed back to [facility] 

## 2010-12-14 ENCOUNTER — Other Ambulatory Visit: Payer: Self-pay | Admitting: Cardiology

## 2010-12-14 ENCOUNTER — Encounter (INDEPENDENT_AMBULATORY_CARE_PROVIDER_SITE_OTHER): Payer: Medicare Other | Admitting: Cardiology

## 2010-12-14 DIAGNOSIS — I6529 Occlusion and stenosis of unspecified carotid artery: Secondary | ICD-10-CM

## 2010-12-20 ENCOUNTER — Other Ambulatory Visit: Payer: Self-pay | Admitting: *Deleted

## 2010-12-20 DIAGNOSIS — E119 Type 2 diabetes mellitus without complications: Secondary | ICD-10-CM

## 2010-12-20 MED ORDER — GLYBURIDE 2.5 MG PO TABS: 2.5000 mg | ORAL_TABLET | Freq: Every day | ORAL | Status: DC

## 2010-12-21 ENCOUNTER — Ambulatory Visit: Payer: Medicare Other | Admitting: Internal Medicine

## 2010-12-22 ENCOUNTER — Telehealth: Payer: Self-pay | Admitting: *Deleted

## 2010-12-22 DIAGNOSIS — E119 Type 2 diabetes mellitus without complications: Secondary | ICD-10-CM

## 2010-12-22 NOTE — Telephone Encounter (Signed)
Pt was taking Glyburide 5 mg and at his last OV it was decreased to 2.5 mg.  Pt wants to increase it back to 5 mg.  He has an appt with you on 10/10 for a follow up.  Do you want to wait and discuss at next OV?

## 2010-12-23 NOTE — Telephone Encounter (Signed)
ok 

## 2010-12-26 MED ORDER — GLYBURIDE 5 MG PO TABS: 5.0000 mg | ORAL_TABLET | Freq: Every day | ORAL | Status: DC

## 2010-12-26 NOTE — Telephone Encounter (Signed)
Pt aware, rx sent in electronically to [facility]

## 2011-01-18 ENCOUNTER — Other Ambulatory Visit (INDEPENDENT_AMBULATORY_CARE_PROVIDER_SITE_OTHER): Payer: Medicare Other

## 2011-01-18 DIAGNOSIS — E119 Type 2 diabetes mellitus without complications: Secondary | ICD-10-CM

## 2011-01-18 DIAGNOSIS — E291 Testicular hypofunction: Secondary | ICD-10-CM

## 2011-01-18 LAB — LIPID PANEL
Cholesterol: 137 mg/dL (ref 0–200)
Triglycerides: 116 mg/dL (ref 0.0–149.0)

## 2011-01-18 LAB — HEPATIC FUNCTION PANEL
ALT: 31 U/L (ref 0–53)
AST: 28 U/L (ref 0–37)
Albumin: 4.1 g/dL (ref 3.5–5.2)
Alkaline Phosphatase: 78 U/L (ref 39–117)
Total Protein: 6.9 g/dL (ref 6.0–8.3)

## 2011-01-18 LAB — BASIC METABOLIC PANEL
Calcium: 9.3 mg/dL (ref 8.4–10.5)
GFR: 67.43 mL/min (ref >60.00)
Potassium: 4.6 mEq/L (ref 3.5–5.1)
Sodium: 138 mEq/L (ref 135–145)

## 2011-01-19 ENCOUNTER — Other Ambulatory Visit: Payer: Medicare Other

## 2011-01-20 LAB — HEMOGLOBIN A1C: Hgb A1c MFr Bld: 8.4 % — ABNORMAL HIGH (ref 4.6–6.5)

## 2011-01-25 ENCOUNTER — Ambulatory Visit (INDEPENDENT_AMBULATORY_CARE_PROVIDER_SITE_OTHER): Payer: Medicare Other | Admitting: Internal Medicine

## 2011-01-25 ENCOUNTER — Encounter: Payer: Self-pay | Admitting: Internal Medicine

## 2011-01-25 ENCOUNTER — Ambulatory Visit: Payer: Medicare Other | Admitting: Internal Medicine

## 2011-01-25 ENCOUNTER — Telehealth: Payer: Self-pay | Admitting: *Deleted

## 2011-01-25 DIAGNOSIS — I471 Supraventricular tachycardia, unspecified: Secondary | ICD-10-CM

## 2011-01-25 DIAGNOSIS — I498 Other specified cardiac arrhythmias: Secondary | ICD-10-CM

## 2011-01-25 DIAGNOSIS — D509 Iron deficiency anemia, unspecified: Secondary | ICD-10-CM

## 2011-01-25 DIAGNOSIS — Z23 Encounter for immunization: Secondary | ICD-10-CM

## 2011-01-25 DIAGNOSIS — E119 Type 2 diabetes mellitus without complications: Secondary | ICD-10-CM

## 2011-01-25 DIAGNOSIS — Z8679 Personal history of other diseases of the circulatory system: Secondary | ICD-10-CM | POA: Insufficient documentation

## 2011-01-25 DIAGNOSIS — E785 Hyperlipidemia, unspecified: Secondary | ICD-10-CM

## 2011-01-25 NOTE — Progress Notes (Signed)
  Subjective:    Patient ID: Steven [name], male    DOB: 17-Jul-1930, 77 y.o.   MRN: [id number]  HPI  Pt with new complaint---has had a few episodes of a "fast" heart rate---documented pulse of 133. Pt states he has some chest pain associated with these episodes of tachycardia. No SOB. Reviewed recent stress/nuclear study.   Pt with known CAD---s/p CABG.  DM---not following a diet or exercise plan.   Lipids---tolerating meds without difficulty  Past Medical History  Diagnosis Date  . Diabetes mellitus     type II  . GERD (gastroesophageal reflux disease)   . Hyperlipidemia   . Anemia   . CAD (coronary artery disease) 2004    s/p inferior wall Mi 2004 with PTCA/Stent; s/p CABG 2004  . History of myocardial infarction    Past Surgical History  Procedure Date  . Coronary artery bypass graft 2004    LIMA to LAD; SVG to ramus intermedius; SVG to PDA/PLSA  . Tonsillectomy     reports that he has quit smoking. His smoking use included Pipe. He does not have any smokeless tobacco history on file. He reports that he drinks alcohol. He reports that he does not use illicit drugs. family history includes Cancer in his sister; Cirrhosis in his mother; Heart disease in his maternal grandfather; Hypertension in his father; and Stroke in his father, maternal grandmother, paternal grandfather, and paternal grandmother. No Known Allergies   Review of Systems  patient denies chest pain, shortness of breath, orthopnea. Denies lower extremity edema, abdominal pain, change in appetite, change in bowel movements. Patient denies rashes, musculoskeletal complaints. No other specific complaints in a complete review of systems.      Objective:   Physical Exam  well-developed well-nourished male in no acute distress. HEENT exam atraumatic, normocephalic, neck supple without jugular venous distention. Chest clear to auscultation cardiac exam S1-S2 are regular. Abdominal exam overweight with bowel sounds,  soft and nontender. Extremities no edema. Neurologic exam is alert with a normal gait.        Assessment & Plan:

## 2011-01-25 NOTE — Assessment & Plan Note (Signed)
Lab Results  Component Value Date   CHOL 137 01/18/2011   CHOL 140 08/04/2010   CHOL 179 05/09/2010   Lab Results  Component Value Date   HDL 39.00* 01/18/2011   HDL 35.60* 08/04/2010   HDL 34.10* 05/09/2010   Lab Results  Component Value Date   LDLCALC 75 01/18/2011   LDLCALC 82 08/04/2010   LDLCALC 107* 05/09/2010   Lab Results  Component Value Date   TRIG 116.0 01/18/2011   TRIG 112.0 08/04/2010   TRIG 191.0* 05/09/2010   Lab Results  Component Value Date   CHOLHDL 4 01/18/2011   CHOLHDL 4 08/04/2010   CHOLHDL 5 05/09/2010   No results found for this basename: LDLDIRECT   Adequately controlled

## 2011-01-25 NOTE — Assessment & Plan Note (Addendum)
A presumptive diagnosis based on history---has associated anginal symptoms. He is having no sxs curently Will make appt. With CV today.

## 2011-01-25 NOTE — Assessment & Plan Note (Signed)
Lab Results  Component Value Date   HGBA1C 8.4* 01/18/2011   Not as well controlled Needs to lose weight---follow a diet (low calorie, low CHO diet)

## 2011-01-25 NOTE — Assessment & Plan Note (Signed)
Lab Results  Component Value Date   HGB 13.5 10/04/2010   Resolved---will resolve problem in problem list

## 2011-01-25 NOTE — Telephone Encounter (Signed)
Pt wanted to know if Dr Cato Mulligan thought it was ok to work in the yard

## 2011-01-26 NOTE — Telephone Encounter (Signed)
Pt aware.

## 2011-01-26 NOTE — Telephone Encounter (Signed)
Not until after the cardiology visit

## 2011-01-27 ENCOUNTER — Ambulatory Visit (INDEPENDENT_AMBULATORY_CARE_PROVIDER_SITE_OTHER): Payer: Medicare Other | Admitting: Internal Medicine

## 2011-01-27 ENCOUNTER — Encounter: Payer: Self-pay | Admitting: Internal Medicine

## 2011-01-27 VITALS — BP 108/60 | HR 76 | Ht 69.0 in | Wt 178.0 lb

## 2011-01-27 DIAGNOSIS — I498 Other specified cardiac arrhythmias: Secondary | ICD-10-CM

## 2011-01-27 DIAGNOSIS — I252 Old myocardial infarction: Secondary | ICD-10-CM

## 2011-01-27 DIAGNOSIS — E785 Hyperlipidemia, unspecified: Secondary | ICD-10-CM

## 2011-01-27 DIAGNOSIS — I471 Supraventricular tachycardia: Secondary | ICD-10-CM

## 2011-01-27 DIAGNOSIS — I2510 Atherosclerotic heart disease of native coronary artery without angina pectoris: Secondary | ICD-10-CM

## 2011-01-27 NOTE — Patient Instructions (Signed)
Lab work today TSH. We will call you with results.  Your physician has recommended that you wear an event monitor. Event monitors are medical devices that record the heart's electrical activity. Doctors most often [place] these monitors to diagnose arrhythmias. Arrhythmias are problems with the speed or rhythm of the heartbeat. The monitor is a small, portable device. You can wear one while you do your normal daily activities. This is usually used to diagnose what is causing palpitations/syncope (passing out).

## 2011-01-27 NOTE — Progress Notes (Signed)
Patient ID: Steven [name], male    DOB: 02/10/1931, 77 y.o.   MRN: [id number]  HPI HPI Patient is a 77 year old with a history of CAD.   Recently laying in bed noticed heart beating faster.  Then noticed in R arm. Patient went to sleep.  No real dizziness.  Felt up in throat.  A few mornings ago having breakfast.  Felt heart racing.  Pulse was faster   BP was 120 to 130s.  HR 90s to 130s. Can last several hours.  With this feels tight in chest.  Breaths heavy.No dizziness. No Known Allergies  Current Outpatient Prescriptions  Medication Sig Dispense Refill  . Acyclovir 5 % cream Apply topically as needed.        Marland Kitchen atorvastatin (LIPITOR) 40 MG tablet Take 40 mg by mouth daily.        . [facility] IRON 325 (65 FE) MG tablet TAKE 1 TABLET BY MOUTH TWO TIMES A DAY  60 tablet  6  . diclofenac (VOLTAREN) 75 MG EC tablet Take 75 mg by mouth 2 (two) times daily.        . famotidine (PEPCID) 20 MG tablet Take 20 mg by mouth daily.       . fish oil-omega-3 fatty acids 1000 MG capsule Take 1 g by mouth daily.        . fluticasone (VERAMYST) 27.5 MCG/SPRAY nasal spray Place 2 sprays into the nose daily. Each nostril.  10 g  11  . FreeStyle Unistick II Lancets MISC Use once daily as instructed.       Marland Kitchen glucose blood (FREESTYLE LITE) test strip Use once daily as instructed       . glyBURIDE (DIABETA) 5 MG tablet Take 1 tablet (5 mg total) by mouth daily with breakfast.  90 tablet  3  . metFORMIN (GLUCOPHAGE) 1000 MG tablet Take 1,000 mg by mouth 2 (two) times daily with a meal.        . metoprolol (TOPROL-XL) 25 MG 24 hr tablet Take 25 mg by mouth daily.        . Multiple Vitamin (MULTIVITAMIN) tablet Take 1 tablet by mouth daily.        Marland Kitchen olopatadine (PATANOL) 0.1 % ophthalmic solution Place 1 drop into both eyes 2 (two) times daily.        . ramipril (ALTACE) 10 MG capsule Take 10 mg by mouth daily.         Current Facility-Administered Medications  Medication Dose Route Frequency Provider Last Rate  Last Dose  . testosterone cypionate (DEPOTESTOTERONE CYPIONATE) injection 200 mg  200 mg Intramuscular Q28 days Judie Petit, MD   200 mg at 09/08/10 1338    Past Medical History  Diagnosis Date  . Diabetes mellitus     type II  . GERD (gastroesophageal reflux disease)   . Hyperlipidemia   . Anemia   . CAD (coronary artery disease) 2004    s/p inferior wall Mi 2004 with PTCA/Stent; s/p CABG 2004  . History of myocardial infarction     Past Surgical History  Procedure Date  . Coronary artery bypass graft 2004    LIMA to LAD; SVG to ramus intermedius; SVG to PDA/PLSA  . Tonsillectomy     Family History  Problem Relation Age of Onset  . Cirrhosis Mother     etiology unclear  . Hypertension Father   . Stroke Father   . Cancer Sister     breast  .  Stroke Maternal Grandmother   . Heart disease Maternal Grandfather     MI  . Stroke Paternal Grandmother   . Stroke Paternal Grandfather     History   Social History  . Marital Status: Married    Spouse Name: N/A    Number of Children: N/A  . Years of Education: N/A   Occupational History  . Not on file.   Social History Main Topics  . Smoking status: Former Smoker    Types: Pipe  . Smokeless tobacco: Not on file  . Alcohol Use: Yes  . Drug Use: No  . Sexually Active: Not on file   Other Topics Concern  . Not on file   Social History Narrative   Regular exercise: noDaily Caffeine: 3 cups coffee daily    Review of Systems:  All systems reviewed.  They are negative to the above problem except as previously stated.  Vital Signs: BP 108/60  Pulse 76  Ht 5\' 9"  (1.753 m)  Wt 178 lb (80.74 kg)  BMI 26.29 kg/m2  Physical Exam  Patient is in NAD HEENT:  Normocephalic, atraumatic. EOMI, PERRLA.  Neck: JVP is normal. No thyromegaly. No bruits.  Lungs: clear to auscultation. No rales no wheezes.  Heart: Regular rate and rhythm. Normal S1, S2. No S3.   No significant murmurs. PMI not displaced.  Abdomen:   Supple, nontender. Normal bowel sounds. No masses. No hepatomegaly.  Extremities:   Good distal pulses throughout. No lower extremity edema.  Musculoskeletal :moving all extremities.  Neuro:   alert and oriented x3.  CN II-XII grossly intact.  EKG:  Sinus rhythm  76 bpm.     Assessment and Plan:       Review of Systems    Physical Exam

## 2011-01-31 NOTE — Assessment & Plan Note (Signed)
Keep on current regimen. 

## 2011-01-31 NOTE — Assessment & Plan Note (Signed)
I am not sure what these events represent  I would recomm an event monitor to evaluate.  Question Afib.  I would also check TSH No change in meds for now.

## 2011-01-31 NOTE — Assessment & Plan Note (Signed)
Follow.  Define rhythm problem.

## 2011-03-08 ENCOUNTER — Other Ambulatory Visit: Payer: Self-pay | Admitting: *Deleted

## 2011-03-08 MED ORDER — ATORVASTATIN CALCIUM 40 MG PO TABS: 40.0000 mg | ORAL_TABLET | Freq: Every day | ORAL | Status: DC

## 2011-03-08 MED ORDER — OMEPRAZOLE 20 MG PO CPDR: 20.0000 mg | DELAYED_RELEASE_CAPSULE | Freq: Every day | ORAL | Status: DC

## 2011-03-08 MED ORDER — METFORMIN HCL 1000 MG PO TABS: 1000.0000 mg | ORAL_TABLET | Freq: Two times a day (BID) | ORAL | Status: DC

## 2011-04-18 HISTORY — PX: BACK SURGERY: SHX140

## 2011-05-01 ENCOUNTER — Other Ambulatory Visit: Payer: Self-pay | Admitting: *Deleted

## 2011-05-01 MED ORDER — OMEPRAZOLE 20 MG PO CPDR: 20.0000 mg | DELAYED_RELEASE_CAPSULE | Freq: Two times a day (BID) | ORAL | Status: DC

## 2011-05-24 ENCOUNTER — Ambulatory Visit: Payer: Medicare Other | Attending: Physical Medicine and Rehabilitation | Admitting: Physical Therapy

## 2011-05-24 DIAGNOSIS — M545 Low back pain, unspecified: Secondary | ICD-10-CM | POA: Insufficient documentation

## 2011-05-24 DIAGNOSIS — M25659 Stiffness of unspecified hip, not elsewhere classified: Secondary | ICD-10-CM | POA: Insufficient documentation

## 2011-05-24 DIAGNOSIS — IMO0001 Reserved for inherently not codable concepts without codable children: Secondary | ICD-10-CM | POA: Insufficient documentation

## 2011-05-24 DIAGNOSIS — R269 Unspecified abnormalities of gait and mobility: Secondary | ICD-10-CM | POA: Insufficient documentation

## 2011-05-25 ENCOUNTER — Telehealth: Payer: Self-pay | Admitting: Internal Medicine

## 2011-05-25 DIAGNOSIS — M549 Dorsalgia, unspecified: Secondary | ICD-10-CM

## 2011-05-25 NOTE — Telephone Encounter (Signed)
Pt req to get a referral to a neurologist. Pt called [facility], but they do not accept his [facility] [employer]. Pls advise.

## 2011-05-26 NOTE — Telephone Encounter (Signed)
Per Dr Cato Mulligan he wants pt to get back xray.  Called pt and he had xray done on 05/09/11 at [facility].  Call Junious Dresser at [phone] and l/m for her fax xray results to [place].

## 2011-05-26 NOTE — Telephone Encounter (Signed)
Pt not getting better and wants to know who Dr Cato Mulligan recommends

## 2011-05-26 NOTE — Telephone Encounter (Signed)
Now pt is wanting a referral to neurology

## 2011-05-29 ENCOUNTER — Ambulatory Visit: Payer: Medicare Other | Admitting: Physical Therapy

## 2011-05-29 NOTE — Telephone Encounter (Signed)
ok 

## 2011-05-29 NOTE — Telephone Encounter (Signed)
Not sure why he sent this to me.Marland KitchenMarland KitchenMarland Kitchen

## 2011-05-29 NOTE — Telephone Encounter (Signed)
Pt called back and wants a referral to [facility] because they accept his insurance.  Please advise

## 2011-05-30 NOTE — Telephone Encounter (Signed)
Referral order placed.

## 2011-05-31 ENCOUNTER — Ambulatory Visit: Payer: Medicare Other | Admitting: Physical Therapy

## 2011-06-01 ENCOUNTER — Other Ambulatory Visit: Payer: Self-pay | Admitting: Internal Medicine

## 2011-06-02 ENCOUNTER — Other Ambulatory Visit: Payer: Self-pay

## 2011-06-02 MED ORDER — DICLOFENAC SODIUM 75 MG PO TBEC: 75.0000 mg | DELAYED_RELEASE_TABLET | Freq: Two times a day (BID) | ORAL | Status: DC

## 2011-06-02 MED ORDER — RAMIPRIL 10 MG PO CAPS: 10.0000 mg | ORAL_CAPSULE | Freq: Every day | ORAL | Status: DC

## 2011-06-02 NOTE — Telephone Encounter (Signed)
Rx sent to pharmacy   

## 2011-06-02 NOTE — Telephone Encounter (Signed)
Addended by: Azucena Freed on: 06/02/2011 03:36 PM   Modules accepted: Orders

## 2011-06-05 ENCOUNTER — Ambulatory Visit: Payer: Medicare Other | Admitting: Physical Therapy

## 2011-06-06 ENCOUNTER — Telehealth: Payer: Self-pay | Admitting: *Deleted

## 2011-06-06 ENCOUNTER — Other Ambulatory Visit: Payer: Self-pay

## 2011-06-06 DIAGNOSIS — M549 Dorsalgia, unspecified: Secondary | ICD-10-CM

## 2011-06-06 MED ORDER — METOPROLOL SUCCINATE ER 25 MG PO TB24: 25.0000 mg | ORAL_TABLET | Freq: Every day | ORAL | Status: DC

## 2011-06-06 NOTE — Telephone Encounter (Signed)
Ok to refer for back pain? 

## 2011-06-06 NOTE — Telephone Encounter (Signed)
Pt states he needs his referral to [facility] TODAY.  Has spoken to Dr. Cato Mulligan and would like him to please take care of this ASAP.   Fax to [place] :  [phone]

## 2011-06-06 NOTE — Telephone Encounter (Signed)
Rx sent to [facility]

## 2011-06-06 NOTE — Telephone Encounter (Signed)
Pt informed-referral sent to [facility] and was told the process and could take as long as a few days to a week to hear from them.

## 2011-06-07 ENCOUNTER — Ambulatory Visit: Payer: Medicare Other | Admitting: Physical Therapy

## 2011-06-11 ENCOUNTER — Other Ambulatory Visit: Payer: Self-pay | Admitting: Internal Medicine

## 2011-06-12 ENCOUNTER — Ambulatory Visit: Payer: Medicare Other

## 2011-06-14 ENCOUNTER — Ambulatory Visit: Payer: Medicare Other | Admitting: Physical Therapy

## 2011-06-16 ENCOUNTER — Telehealth: Payer: Self-pay | Admitting: *Deleted

## 2011-06-16 NOTE — Telephone Encounter (Signed)
Pt is no longer a pt of [facility] and his appt with [facility] is 07/05/11.  Pt was wanting to know if Dr Cato Mulligan would give him a rx for his hydrocodone 7.5/325 until he sees [facility].  Please advise

## 2011-06-19 ENCOUNTER — Ambulatory Visit: Payer: Medicare Other | Attending: Physical Medicine and Rehabilitation | Admitting: Physical Therapy

## 2011-06-19 DIAGNOSIS — R269 Unspecified abnormalities of gait and mobility: Secondary | ICD-10-CM | POA: Insufficient documentation

## 2011-06-19 DIAGNOSIS — IMO0001 Reserved for inherently not codable concepts without codable children: Secondary | ICD-10-CM | POA: Insufficient documentation

## 2011-06-19 DIAGNOSIS — M25659 Stiffness of unspecified hip, not elsewhere classified: Secondary | ICD-10-CM | POA: Insufficient documentation

## 2011-06-19 DIAGNOSIS — M545 Low back pain, unspecified: Secondary | ICD-10-CM | POA: Insufficient documentation

## 2011-06-19 MED ORDER — HYDROCODONE-ACETAMINOPHEN 5-500 MG PO TABS: 1.0000 | ORAL_TABLET | Freq: Two times a day (BID) | ORAL | Status: AC | PRN

## 2011-06-19 NOTE — Telephone Encounter (Signed)
rx called in

## 2011-06-19 NOTE — Telephone Encounter (Signed)
vicodin 5/500, 1 po bid prn pain, #60/1 refill

## 2011-06-21 ENCOUNTER — Ambulatory Visit: Payer: Medicare Other

## 2011-06-26 ENCOUNTER — Ambulatory Visit: Payer: Medicare Other | Admitting: Physical Therapy

## 2011-06-29 ENCOUNTER — Ambulatory Visit: Payer: Medicare Other | Admitting: Physical Therapy

## 2011-07-04 ENCOUNTER — Ambulatory Visit: Payer: Medicare Other | Admitting: Physical Therapy

## 2011-07-06 ENCOUNTER — Ambulatory Visit: Payer: Medicare Other | Admitting: Physical Therapy

## 2011-07-11 ENCOUNTER — Ambulatory Visit: Payer: Medicare Other | Admitting: Physical Therapy

## 2011-07-13 ENCOUNTER — Other Ambulatory Visit (HOSPITAL_COMMUNITY): Payer: Self-pay | Admitting: Neurosurgery

## 2011-07-13 ENCOUNTER — Encounter: Payer: Medicare Other | Admitting: Physical Therapy

## 2011-07-13 DIAGNOSIS — M48061 Spinal stenosis, lumbar region without neurogenic claudication: Secondary | ICD-10-CM

## 2011-07-13 DIAGNOSIS — M5414 Radiculopathy, thoracic region: Secondary | ICD-10-CM

## 2011-07-14 ENCOUNTER — Ambulatory Visit (HOSPITAL_COMMUNITY): Payer: Medicare Other

## 2011-07-14 ENCOUNTER — Ambulatory Visit (HOSPITAL_COMMUNITY)
Admission: RE | Admit: 2011-07-14 | Discharge: 2011-07-14 | Disposition: A | Discharge status: Home / Self Care | Payer: Medicare Other | Source: Ambulatory Visit | Attending: Neurosurgery | Admitting: Neurosurgery

## 2011-07-14 DIAGNOSIS — M5126 Other intervertebral disc displacement, lumbar region: Secondary | ICD-10-CM | POA: Insufficient documentation

## 2011-07-14 DIAGNOSIS — M48061 Spinal stenosis, lumbar region without neurogenic claudication: Secondary | ICD-10-CM

## 2011-07-14 DIAGNOSIS — IMO0002 Reserved for concepts with insufficient information to code with codable children: Secondary | ICD-10-CM | POA: Insufficient documentation

## 2011-07-14 DIAGNOSIS — M5414 Radiculopathy, thoracic region: Secondary | ICD-10-CM

## 2011-07-14 DIAGNOSIS — R209 Unspecified disturbances of skin sensation: Secondary | ICD-10-CM | POA: Insufficient documentation

## 2011-07-18 ENCOUNTER — Encounter: Payer: Medicare Other | Admitting: Physical Therapy

## 2011-07-20 ENCOUNTER — Encounter: Payer: Medicare Other | Admitting: Physical Therapy

## 2011-07-24 ENCOUNTER — Other Ambulatory Visit: Payer: Self-pay | Admitting: Internal Medicine

## 2011-07-25 ENCOUNTER — Other Ambulatory Visit: Payer: Self-pay | Admitting: *Deleted

## 2011-07-25 MED ORDER — GLUCOSE BLOOD VI STRP: ORAL_STRIP | Status: DC

## 2011-07-26 ENCOUNTER — Other Ambulatory Visit: Payer: Self-pay | Admitting: Internal Medicine

## 2011-08-03 DIAGNOSIS — M1710 Unilateral primary osteoarthritis, unspecified knee: Secondary | ICD-10-CM | POA: Diagnosis not present

## 2011-08-06 ENCOUNTER — Other Ambulatory Visit: Payer: Self-pay | Admitting: Internal Medicine

## 2011-08-07 DIAGNOSIS — Z0189 Encounter for other specified special examinations: Secondary | ICD-10-CM | POA: Diagnosis not present

## 2011-08-07 DIAGNOSIS — M545 Low back pain: Secondary | ICD-10-CM | POA: Diagnosis not present

## 2011-08-11 ENCOUNTER — Other Ambulatory Visit: Payer: Self-pay | Admitting: Neurosurgery

## 2011-08-11 DIAGNOSIS — IMO0002 Reserved for concepts with insufficient information to code with codable children: Secondary | ICD-10-CM

## 2011-08-11 DIAGNOSIS — M48062 Spinal stenosis, lumbar region with neurogenic claudication: Secondary | ICD-10-CM

## 2011-08-15 ENCOUNTER — Other Ambulatory Visit: Payer: Self-pay | Admitting: *Deleted

## 2011-08-15 MED ORDER — KETOCONAZOLE 2 % EX SHAM: MEDICATED_SHAMPOO | CUTANEOUS | Status: DC

## 2011-08-16 ENCOUNTER — Ambulatory Visit (HOSPITAL_COMMUNITY)
Admission: RE | Admit: 2011-08-16 | Discharge: 2011-08-16 | Disposition: A | Discharge status: Home / Self Care | Payer: Medicare Other | Source: Ambulatory Visit | Attending: Neurosurgery | Admitting: Neurosurgery

## 2011-08-16 ENCOUNTER — Ambulatory Visit (HOSPITAL_COMMUNITY): Payer: Medicare Other

## 2011-08-16 DIAGNOSIS — M47817 Spondylosis without myelopathy or radiculopathy, lumbosacral region: Secondary | ICD-10-CM | POA: Diagnosis not present

## 2011-08-16 DIAGNOSIS — M519 Unspecified thoracic, thoracolumbar and lumbosacral intervertebral disc disorder: Secondary | ICD-10-CM | POA: Insufficient documentation

## 2011-08-16 DIAGNOSIS — M47814 Spondylosis without myelopathy or radiculopathy, thoracic region: Secondary | ICD-10-CM | POA: Insufficient documentation

## 2011-08-16 DIAGNOSIS — M48061 Spinal stenosis, lumbar region without neurogenic claudication: Secondary | ICD-10-CM | POA: Insufficient documentation

## 2011-08-16 DIAGNOSIS — M5126 Other intervertebral disc displacement, lumbar region: Secondary | ICD-10-CM | POA: Diagnosis not present

## 2011-08-16 DIAGNOSIS — IMO0002 Reserved for concepts with insufficient information to code with codable children: Secondary | ICD-10-CM

## 2011-08-16 DIAGNOSIS — M869 Osteomyelitis, unspecified: Secondary | ICD-10-CM | POA: Diagnosis not present

## 2011-08-16 DIAGNOSIS — M48062 Spinal stenosis, lumbar region with neurogenic claudication: Secondary | ICD-10-CM

## 2011-08-16 LAB — CREATININE, SERUM: GFR calc Af Amer: >90 mL/min (ref >90)

## 2011-08-16 LAB — BUN: BUN: 24 mg/dL — ABNORMAL HIGH (ref 6–23)

## 2011-08-16 MED ORDER — GADOBENATE DIMEGLUMINE 529 MG/ML IV SOLN
15.0000 mL | Freq: Once | INTRAVENOUS | Status: AC
  Administered 2011-08-16: 15 mL via INTRAVENOUS

## 2011-08-17 DIAGNOSIS — M1710 Unilateral primary osteoarthritis, unspecified knee: Secondary | ICD-10-CM | POA: Diagnosis not present

## 2011-08-22 ENCOUNTER — Encounter: Payer: Self-pay | Admitting: Internal Medicine

## 2011-08-22 ENCOUNTER — Ambulatory Visit (INDEPENDENT_AMBULATORY_CARE_PROVIDER_SITE_OTHER): Payer: Medicare Other | Admitting: Internal Medicine

## 2011-08-22 VITALS — BP 138/82 | HR 88 | Temp 98.3°F | Wt 158.0 lb

## 2011-08-22 DIAGNOSIS — E785 Hyperlipidemia, unspecified: Secondary | ICD-10-CM

## 2011-08-22 DIAGNOSIS — I25810 Atherosclerosis of coronary artery bypass graft(s) without angina pectoris: Secondary | ICD-10-CM

## 2011-08-22 DIAGNOSIS — E119 Type 2 diabetes mellitus without complications: Secondary | ICD-10-CM

## 2011-08-22 DIAGNOSIS — M5416 Radiculopathy, lumbar region: Secondary | ICD-10-CM

## 2011-08-22 DIAGNOSIS — IMO0002 Reserved for concepts with insufficient information to code with codable children: Secondary | ICD-10-CM

## 2011-08-22 LAB — LIPID PANEL
HDL: 47.3 mg/dL (ref >39.00)
Total CHOL/HDL Ratio: 3
Triglycerides: 97 mg/dL (ref 0.0–149.0)

## 2011-08-22 LAB — BASIC METABOLIC PANEL
CO2: 23 mEq/L (ref 19–32)
Calcium: 9.7 mg/dL (ref 8.4–10.5)
Creatinine, Ser: 0.9 mg/dL (ref 0.4–1.5)

## 2011-08-22 LAB — CBC WITH DIFFERENTIAL/PLATELET
Basophils Relative: 0.5 % (ref 0.0–3.0)
Eosinophils Relative: 1.2 % (ref 0.0–5.0)
HCT: 37.7 % — ABNORMAL LOW (ref 39.0–52.0)
Lymphs Abs: 1.8 10*3/uL (ref 0.7–4.0)
MCV: 97.1 fl (ref 78.0–100.0)
Monocytes Absolute: 0.5 10*3/uL (ref 0.1–1.0)
Neutro Abs: 4.7 10*3/uL (ref 1.4–7.7)
Platelets: 248 10*3/uL (ref 150.0–400.0)
RBC: 3.88 Mil/uL — ABNORMAL LOW (ref 4.22–5.81)
WBC: 7.1 10*3/uL (ref 4.5–10.5)

## 2011-08-22 LAB — HEPATIC FUNCTION PANEL
Bilirubin, Direct: 0 mg/dL (ref 0.0–0.3)
Total Bilirubin: 0.6 mg/dL (ref 0.3–1.2)
Total Protein: 6.9 g/dL (ref 6.0–8.3)

## 2011-08-22 NOTE — Progress Notes (Signed)
Patient ID: Steven [name], male   DOB: [birth year], 78 y.o.   MRN: [id number]  Radiculopathy-- see MRI and Dr. Fredrich Birks note  DM-- needs f/u  htn--tolerating meds  CAD-- no sxs and he is taking meds  Past Medical History  Diagnosis Date  . Diabetes mellitus     type II  . GERD (gastroesophageal reflux disease)   . Hyperlipidemia   . Anemia   . CAD (coronary artery disease) 2004    s/p inferior wall Mi 2004 with PTCA/Stent; s/p CABG 2004  . History of myocardial infarction     History   Social History  . Marital Status: Married    Spouse Name: N/A    Number of Children: N/A  . Years of Education: N/A   Occupational History  . Not on file.   Social History Main Topics  . Smoking status: Former Smoker    Types: Pipe  . Smokeless tobacco: Not on file  . Alcohol Use: Yes  . Drug Use: No  . Sexually Active: Not on file   Other Topics Concern  . Not on file   Social History Narrative   Regular exercise: noDaily Caffeine: 3 cups coffee daily    Past Surgical History  Procedure Date  . Coronary artery bypass graft 2004    LIMA to LAD; SVG to ramus intermedius; SVG to PDA/PLSA  . Tonsillectomy     Family History  Problem Relation Age of Onset  . Cirrhosis Mother     etiology unclear  . Hypertension Father   . Stroke Father   . Cancer Sister     breast  . Stroke Maternal Grandmother   . Heart disease Maternal Grandfather     MI  . Stroke Paternal Grandmother   . Stroke Paternal Grandfather     No Known Allergies  Current Outpatient Prescriptions on File Prior to Visit  Medication Sig Dispense Refill  . atorvastatin (LIPITOR) 40 MG tablet TAKE 1 TABLET BY MOUTH ONCE A DAY  90 tablet  1  . [facility] IRON 325 (65 FE) MG tablet TAKE 1 TABLET BY MOUTH TWO TIMES A DAY  60 tablet  6  . diclofenac (VOLTAREN) 75 MG EC tablet Take 1 tablet (75 mg total) by mouth 2 (two) times daily.  180 tablet  0  . famotidine (PEPCID) 20 MG tablet Take 20 mg by mouth daily.       .  fish oil-omega-3 fatty acids 1000 MG capsule Take 1 g by mouth daily.        . fluticasone (VERAMYST) 27.5 MCG/SPRAY nasal spray Place 2 sprays into the nose daily. Each nostril.  10 g  11  . FreeStyle Unistick II Lancets MISC Use once daily as instructed.       Marland Kitchen glucose blood (FREESTYLE LITE) test strip Use once daily as instructed  100 each  11  . glyBURIDE (DIABETA) 5 MG tablet Take 1 tablet (5 mg total) by mouth daily with breakfast.  90 tablet  3  . ketoconazole (NIZORAL) 2 % shampoo Apply topically 2 (two) times a week.  120 mL  3  . metFORMIN (GLUCOPHAGE) 1000 MG tablet TAKE 1 TABLET BY MOUTH 2 TIMES A DAY  180 tablet  2  . metoprolol succinate (TOPROL-XL) 25 MG 24 hr tablet Take 1 tablet (25 mg total) by mouth daily.  90 tablet  0  . Multiple Vitamin (MULTIVITAMIN) tablet Take 1 tablet by mouth daily.        Marland Kitchen  olopatadine (PATANOL) 0.1 % ophthalmic solution Place 1 drop into both eyes 2 (two) times daily.        Marland Kitchen omeprazole (PRILOSEC) 20 MG capsule Take 1 capsule (20 mg total) by mouth 2 (two) times daily.  180 capsule  1  . ramipril (ALTACE) 10 MG capsule Take 1 capsule (10 mg total) by mouth daily.  90 capsule  0  . ZOVIRAX 5 % USE AS NEEDED  30 g  0  . DISCONTD: testosterone cypionate (DEPO-TESTOSTERONE) 200 MG/ML injection Inject 1 mL (200 mg total) into the muscle every 30 (thirty) days.  10 mL  0   Current Facility-Administered Medications on File Prior to Visit  Medication Dose Route Frequency Provider Last Rate Last Dose  . testosterone cypionate (DEPOTESTOTERONE CYPIONATE) injection 200 mg  200 mg Intramuscular Q28 days Lindley Magnus, MD   200 mg at 09/08/10 1338     patient denies chest pain, shortness of breath, orthopnea. Denies lower extremity edema, abdominal pain, change in appetite, change in bowel movements. Patient denies rashes, musculoskeletal complaints- back pain is much better. No other specific complaints in a complete review of systems.   BP 138/82  Pulse 88   Temp(Src) 98.3 F (36.8 C) (Oral)  Wt 158 lb (71.668 kg)  well-developed well-nourished male in no acute distress. HEENT exam atraumatic, normocephalic, neck supple without jugular venous distention. Chest clear to auscultation cardiac exam S1-S2 are regular. Abdominal exam overweight with bowel sounds, soft and nontender. Extremities no edema. Broad-based gait.

## 2011-08-23 DIAGNOSIS — M5416 Radiculopathy, lumbar region: Secondary | ICD-10-CM

## 2011-08-23 HISTORY — DX: Radiculopathy, lumbar region: M54.16

## 2011-08-23 NOTE — Assessment & Plan Note (Signed)
Patient has signs and symptoms and imaging studies consistent with a radiculopathy. Has follow Dr. Venetia Maxon. Ongoing to obtain a blood culture and sedimentation rate. There is some question of whether this could be a discitis. I highly doubt that he has a discitis given his clinical symptoms and suspect this is more related to disc herniation or disc rupture.

## 2011-08-28 ENCOUNTER — Other Ambulatory Visit (INDEPENDENT_AMBULATORY_CARE_PROVIDER_SITE_OTHER): Payer: Medicare Other

## 2011-08-28 DIAGNOSIS — D539 Nutritional anemia, unspecified: Secondary | ICD-10-CM

## 2011-08-28 DIAGNOSIS — M545 Low back pain: Secondary | ICD-10-CM | POA: Diagnosis not present

## 2011-08-28 LAB — IBC PANEL
Iron: 83 ug/dL (ref 42–165)
Saturation Ratios: 24.2 % (ref 20.0–50.0)
Transferrin: 245.1 mg/dL (ref 212.0–360.0)

## 2011-08-30 ENCOUNTER — Other Ambulatory Visit: Payer: Self-pay | Admitting: Neurosurgery

## 2011-09-01 ENCOUNTER — Encounter (HOSPITAL_COMMUNITY): Payer: Self-pay | Admitting: Pharmacy Technician

## 2011-09-01 NOTE — Progress Notes (Signed)
Quick Note:  Pt informed ______ 

## 2011-09-04 ENCOUNTER — Encounter (HOSPITAL_COMMUNITY): Payer: Self-pay

## 2011-09-04 ENCOUNTER — Inpatient Hospital Stay (HOSPITAL_COMMUNITY): Admission: RE | Admit: 2011-09-04 | Discharge: 2011-09-04 | Payer: 59 | Source: Ambulatory Visit

## 2011-09-04 HISTORY — DX: Acute myocardial infarction, unspecified: I21.9

## 2011-09-04 HISTORY — DX: Pneumonia, unspecified organism: J18.9

## 2011-09-04 NOTE — Pre-Procedure Instructions (Signed)
22 Steven [name]  09/04/2011   Your procedure is scheduled on:  Friday Sep 08, 2011.  Report to [facility] at 1015 AM.  Call this number if you have problems the morning of surgery: [phone]   Remember:   Do not eat food:After Midnight.  May have clear liquids: up to 4 Hours before arrival until 0615 am.  Clear liquids include soda, tea, black coffee, apple or grape juice, broth.  Take these medicines the morning of surgery with A SIP OF WATER: Metoprolol (Toprol            XL), and Omeprazole (Prilosec).   Do not wear jewelry, make-up or nail polish.  Do not wear lotions, powders, or perfumes. You may wear deodorant.  Do not shave 48 hours prior to surgery. Men may shave face and neck.  Do not bring valuables to the hospital.  Contacts, dentures or bridgework may not be worn into surgery.  Leave suitcase in the car. After surgery it may be brought to your room.  For patients admitted to the hospital, checkout time is 11:00 AM the day of discharge.   Patients discharged the day of surgery will not be allowed to drive home.  Name and phone number of your driver:   Special Instructions: CHG Shower Use Special Wash: 1/2 bottle night before surgery and 1/2 bottle morning of surgery.   Please read over the following fact sheets that you were given: Pain Booklet, Coughing and Deep Breathing, MRSA Information and Surgical Site Infection Prevention

## 2011-09-04 NOTE — Progress Notes (Signed)
Pt denied having a stress test but confirmed to having a cardiac cath followed by a CABG in 2004. Cardiologist is Dietrich Pates at [facility] in [place].

## 2011-09-06 ENCOUNTER — Encounter (HOSPITAL_COMMUNITY)
Admission: RE | Admit: 2011-09-06 | Discharge: 2011-09-06 | Disposition: A | Discharge status: Home / Self Care | Payer: Medicare Other | Source: Ambulatory Visit | Attending: Neurosurgery | Admitting: Neurosurgery

## 2011-09-06 LAB — CBC
Hemoglobin: 12 g/dL — ABNORMAL LOW (ref 13.0–17.0)
MCHC: 34.1 g/dL (ref 30.0–36.0)
Platelets: 199 10*3/uL (ref 150–400)
RBC: 3.8 MIL/uL — ABNORMAL LOW (ref 4.22–5.81)

## 2011-09-06 LAB — BASIC METABOLIC PANEL
GFR calc Af Amer: >90 mL/min (ref >90)
GFR calc non Af Amer: 85 mL/min — ABNORMAL LOW (ref >90)
Potassium: 4.8 mEq/L (ref 3.5–5.1)
Sodium: 137 mEq/L (ref 135–145)

## 2011-09-06 LAB — SURGICAL PCR SCREEN
MRSA, PCR: NEGATIVE
Staphylococcus aureus: NEGATIVE

## 2011-09-07 MED ORDER — CEFAZOLIN SODIUM 1-5 GM-% IV SOLN
1.0000 g | INTRAVENOUS | Status: AC
  Administered 2011-09-08: 1 g via INTRAVENOUS
  Filled 2011-09-07: qty 50

## 2011-09-07 NOTE — H&P (Signed)
Steven [name]  #[id number] DOB:  1930/06/03  08/28/2011:        Steven [name] comes back today. We reviewed his repeat MRI of the lumbar spine.  What appears to be a fluid collection does not appear to have changed significantly and if anything it has regressed somewhat.  This is a large process ventral to the spinal cord at the T12-Ll level.  The radiologist has now raised the possibility that this represents a disc herniation and I believe this is a distinct possibility given his history of intense pain initially followed by persistent numbness and weakness into his legs.  At this point, I think we should go ahead with surgery and this will consist of a left T12-L1 transpedicular resection of the epidural mass.  He is aware of the potential risks and benefits and says he cannot continue with this level of pain and weakness and needs to get something done.  I will plan on doing so and we will set this up for 09/08/2011.  We went over risks and benefits in great detail with the patient and answered his questions.          Danae Orleans. 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  He describes that bending causes some pain. He complains of numbness in both of his feet. He saw Dr. Lequita Halt who cleared him for hip pathology. He has been to a chiropractor without relief.  [facility] had right L5 selective nerve root block x 2 both in December and January neither of which gave him any relief.  He has been taking Diclofenac 75 mg. b.i.d., Oxycodone 10/325 b.i.d. which he says helps him.  He has not been taking Hydrocodone prescriptions.  He has used Biofreeze which he says helps his knees and his feet.    He is planning a right total knee replacement this summer but is on hold at present  because of his back complaints.    REVIEW OF SYSTEMS:   A detailed Review of Systems sheet was reviewed with the patient.  Pertinent positives include Glasses, leg pain, arthritis, and diabetes with excessive urination.    PAST MEDICAL HISTORY:      Current Medical Conditions:    He has a Past Medical History significant for non-insulin dependent diabetes and has carried this diagnosis for about two years.   He had a myocardial infarction in 2004 and has coronary artery disease and angina.      Prior Operations and Hospitalizations:   He had coronary artery bypass grafting in 10/2002, had tonsillectomy as a child.      Medications and Allergies:  Acyclovir 5% cream apply topically p.r.n., Lipitor 40 mg. q.d., [facility] Iron 325 b.i.d., Diclofenac 75 mg. b.i.d., Pepcid 20 mg. q.d., Fish oil omega 3 1000 mg. q.d., Fluticasone 27.5 mcg 2 sprays each nostril q.d., Glyburide 5 mg. q.a.m., Metformin 1000 mg. b.i.d., Metoprolol 25 mg. q.d., Multivitamins q.d., Patanol 0.1% 2 drop both eyes b.i.d., Ramipril 10 mg. q.d. and Depo-Testosterone 200 mg. injection every 28 days.      Height and Weight:     He is 5'8" tall and 180 pounds.         FAMILY HISTORY:    Both parents are deceased, cause  not specified.    SOCIAL HISTORY:    He denies tobacco, alcohol or drug use.      DIAGNOSTIC STUDIES:   He has had radiographs of his lumbar spine which show levoconvexed lumbar scoliosis and thoracolumbar kyphosis.  His left hip appears to be laterally translated on plain radiographs.  He has a combination of scoliosis and also lumbar disc degeneration with lumbar stenosis.   He did not have dedicated imaging of his lumbar spine but this was identified on his hip MRI which shows S-Shaped scoliotic curvature to the lumbar spine with multilevel degenerative disc disease and facet arthropathy with incompletely imaged central canal, lateral recess, and foraminal stenosis.    PHYSICAL EXAMINATION:      General Appearance:   On  examination today, Steven [name] is a pleasant and cooperative man in no acute distress.         Blood Pressure, Pulse:     132/70, heart rate 80 and regular, respirations 16.     HEENT - normocephalic, atraumatic.  The pupils are equal, round and reactive to light.  The extraocular muscles are intact.  Sclerae - white.  Conjunctiva - pink.  Oropharynx benign.  Uvula midline.     Neck - there are no masses, meningismus, deformities, tracheal deviation, jugular vein distention or carotid bruits.  There is normal cervical range of motion.  Spurlings' test is negative without reproducible radicular pain turning the patient's head to either side.  Lhermitte's sign is not present with axial compression.      Respiratory - there is normal respiratory effort with good intercostal function.  Lungs are clear to auscultation.  There are no rales, rhonchi or wheezes.      Cardiovascular - the heart has regular rate and rhythm to auscultation.  No murmurs are appreciated.  There is no extremity edema, cyanosis or clubbing.  There are palpable pedal pulses.      Abdomen - soft, nontender, no hepatosplenomegaly appreciated or masses.  There are active bowel sounds.  No guarding or rebound.      Musculoskeletal Examination - His left hip appears to be laterally translated.  He does not appear to have significant pain to palpation of is thoracolumbar spine.  He does have some evidence of thoracolumbar kyphosis on examination and also scoliosis.   NEUROLOGICAL EXAMINATION: The patient is oriented to time, person and place and has good recall of both recent and remote memory with normal attention span and concentration.  The patient speaks with clear and fluent speech and exhibits normal language function and appropriate fund of knowledge.      Cranial Nerve Examination - pupils are equal, round and reactive to light.  Extraocular movements are full.  Visual fields are full to confrontational testing.  Facial sensation  and facial movement are symmetric and intact.  Hearing is intact to finger rub.  Palate is upgoing.  Shoulder shrug is symmetric.  Tongue protrudes in the midline.      Motor Examination - motor strength is 5/5 in the bilateral deltoids, biceps, triceps, handgrips, wrist extensors, interosseous.  In the lower extremities motor strength is 5/5 in hip flexion, extension, quadriceps, hamstrings, plantar flexion. He has bilateral L5 distribution weakness including 4-/5 bilateral extensor hallucis longus, bilateral dorsiflexion, 4-/5 bilateral hip abductor weakness.    Sensory Examination - he has decreased pin sensation bilateral L5 distribution.  He also has decreased vibratory sensation bilaterally. He does have intact proprioception in his lower extremities.  Deep Tendon Reflexes - 1 in the biceps, triceps, and brachioradialis, 1 in the knees, trace in the ankles.  The great toes are downgoing to plantar stimulation.      Cerebellar Examination - normal coordination in upper and lower extremities and normal rapid alternating movements.  Romberg test is negative.    IMPRESSION AND RECOMMENDATIONS: Steven [name] is a 78 year old man with bilateral L5 radiculopathies.  These may be secondary to diabetic mononeuropathy versus L5 radiculopathy.  I have recommended that he get EMG and nerve conduction velocities as this may be secondary to his diabetes and not to a compressive pathology.  Also we will get MRI of his lumbar spine to assess whether he has significant nerve root compression.  He has significant bilateral lower extremity weakness and this is concerning.  I will make further recommendations after the studies are done.    [facility]   Danae Orleans. Venetia Maxon, M.D.

## 2011-09-07 NOTE — Consult Note (Signed)
Anesthesia Chart Review:  Patient is a 78 year old male scheduled for left T12- L1 resection of epidural mass on 09/08/11.  History includes former smoker, DM2, GERD, HLD, CAD/inferior MI '04 s/p stent, CABG '04, carotid artery disease (60-79% R, 0-39% L),  GERD, PNA, s/p endoscopic nasal cautery to control nasal hemorrhage on 10/04/10.  PCP is Dr. Cato Mulligan.  Cardiologist is Dr. Tenny Craw.  Last visit was on 01/27/11.  EKG then showed NSR.  He saw her after a few episodes of heart racing up to 130 that was associated with chest tightness.  He had a recent stress test, so she only recommended an event monitor, but this has not been done yet.  He is on Toprol.  According to his last PCP note on 08/22/11 he was not having any CV symptoms.  Both Dr. Tenny Craw and Dr. Cato Mulligan are out of the office today, 09/07/11.  Nuclear stress test from 07/05/10 showed: No evidence of ischemia. Inferior scar. This is unchanged from report of previous stress test in 2009. From this it does not appear that SOB is due to blood supply problem to heart.  EF 47%.  Echo on 05/30/07 showed: - Left ventricular size was at the upper limits of normal. Overall left ventricular systolic function was at the lower limits of normal. Left ventricular ejection fraction was estimated to be 50 %. There was moderate hypokinesis of the entire inferior wall. There was hypokinesis of the basal-mid posterior wall. Left ventricular wall thickness was mildly to moderately increased. There was mild focal basal septal hypertrophy. Doppler parameters were consistent with abnormal left ventricular relaxation. - Aortic valve thickness was mildly increased. There was lower normal aortic valve leaflet excursion. There was trivial aortic valvular regurgitation. - There was moderate mitral valvular regurgitation. Mean transmitral gradient was 85 mmHg. The effective orifice of mitral regurgitation by proximal isovelocity surface area was 0.13 cm^2. The volume of mitral  regurgitation by proximal isovelocity surface area was 28 cc. - The left atrium was mildly dilated. - Mild PR/TR.  CXR from 10/03/10 showed: 1. Probable prominent right nipple shadow on PA view - recommend repeat PA with nipple markers.  2. 17 mm calcified gallstone right upper quadrant.  3. No active cardiopulmonary disease (CABG). It doesn't appear that a repeat PA view has been done yet with nipple markers.  Will order for the day of surgery.  Labs noted.  CBG on arrival.    I reviewed above with Anesthesiologist Dr. Randa Evens earlier today who also spoke with Dr. Venetia Maxon.  Patient will be evaluated by his assigned Anesthesiologist pre-operatively.  If remains asymptomatic and stable from a CV standpoint, then anticipate he can proceed.  [facility] said Dr. Tenny Craw will be available on 09/08/11 if the Anesthesiologist feels she needs to be contacted).  Shonna Chock, PA-C

## 2011-09-08 ENCOUNTER — Encounter (HOSPITAL_COMMUNITY): Payer: Self-pay | Admitting: *Deleted

## 2011-09-08 ENCOUNTER — Encounter (HOSPITAL_COMMUNITY): Payer: Self-pay | Admitting: Vascular Surgery

## 2011-09-08 ENCOUNTER — Ambulatory Visit (HOSPITAL_COMMUNITY): Payer: Medicare Other

## 2011-09-08 ENCOUNTER — Ambulatory Visit (HOSPITAL_COMMUNITY): Payer: Medicare Other | Admitting: Vascular Surgery

## 2011-09-08 ENCOUNTER — Encounter (HOSPITAL_COMMUNITY)
Admission: RE | Disposition: A | Discharge status: Home / Self Care | Payer: Self-pay | Source: Ambulatory Visit | Attending: Neurosurgery

## 2011-09-08 ENCOUNTER — Inpatient Hospital Stay (HOSPITAL_COMMUNITY)
Admission: RE | Admit: 2011-09-08 | Discharge: 2011-09-12 | DRG: 491 | Disposition: A | Discharge status: Home / Self Care | Payer: Medicare Other | Source: Ambulatory Visit | Attending: Neurosurgery | Admitting: Neurosurgery

## 2011-09-08 DIAGNOSIS — K219 Gastro-esophageal reflux disease without esophagitis: Secondary | ICD-10-CM | POA: Diagnosis present

## 2011-09-08 DIAGNOSIS — I252 Old myocardial infarction: Secondary | ICD-10-CM | POA: Diagnosis not present

## 2011-09-08 DIAGNOSIS — M545 Low back pain, unspecified: Secondary | ICD-10-CM | POA: Diagnosis not present

## 2011-09-08 DIAGNOSIS — M659 Synovitis and tenosynovitis, unspecified: Secondary | ICD-10-CM | POA: Diagnosis not present

## 2011-09-08 DIAGNOSIS — D497 Neoplasm of unspecified behavior of endocrine glands and other parts of nervous system: Secondary | ICD-10-CM | POA: Diagnosis present

## 2011-09-08 DIAGNOSIS — E291 Testicular hypofunction: Secondary | ICD-10-CM

## 2011-09-08 DIAGNOSIS — Z01812 Encounter for preprocedural laboratory examination: Secondary | ICD-10-CM | POA: Diagnosis not present

## 2011-09-08 DIAGNOSIS — I2510 Atherosclerotic heart disease of native coronary artery without angina pectoris: Secondary | ICD-10-CM | POA: Diagnosis present

## 2011-09-08 DIAGNOSIS — M4714 Other spondylosis with myelopathy, thoracic region: Principal | ICD-10-CM | POA: Diagnosis present

## 2011-09-08 DIAGNOSIS — Z01818 Encounter for other preprocedural examination: Secondary | ICD-10-CM | POA: Diagnosis not present

## 2011-09-08 DIAGNOSIS — Z981 Arthrodesis status: Secondary | ICD-10-CM | POA: Diagnosis not present

## 2011-09-08 DIAGNOSIS — R911 Solitary pulmonary nodule: Secondary | ICD-10-CM | POA: Diagnosis not present

## 2011-09-08 DIAGNOSIS — J984 Other disorders of lung: Secondary | ICD-10-CM | POA: Diagnosis not present

## 2011-09-08 HISTORY — PX: LAMINECTOMY: SHX219

## 2011-09-08 LAB — GLUCOSE, CAPILLARY
Glucose-Capillary: 173 mg/dL — ABNORMAL HIGH (ref 70–99)
Glucose-Capillary: 134 mg/dL — ABNORMAL HIGH (ref 70–99)

## 2011-09-08 SURGERY — LUMBAR LAMINECTOMY FOR TUMOR
Anesthesia: General | Site: Back | Laterality: Left | Wound class: Clean

## 2011-09-08 MED ORDER — SODIUM CHLORIDE 0.9 % IJ SOLN
3.0000 mL | Freq: Two times a day (BID) | INTRAMUSCULAR | Status: DC
  Administered 2011-09-08 – 2011-09-12 (×6): 3 mL via INTRAVENOUS

## 2011-09-08 MED ORDER — MENTHOL 3 MG MT LOZG: 1.0000 | LOZENGE | OROMUCOSAL | Status: DC | PRN

## 2011-09-08 MED ORDER — ACETAMINOPHEN 325 MG PO TABS: 650.0000 mg | ORAL_TABLET | ORAL | Status: DC | PRN

## 2011-09-08 MED ORDER — MIDAZOLAM HCL 5 MG/5ML IJ SOLN
INTRAMUSCULAR | Status: DC | PRN
  Administered 2011-09-08: 2 mg via INTRAVENOUS

## 2011-09-08 MED ORDER — OCUVITE PO TABS
1.0000 | ORAL_TABLET | Freq: Every day | ORAL | Status: DC
  Administered 2011-09-08 – 2011-09-12 (×5): 1 via ORAL
  Filled 2011-09-08 (×6): qty 1

## 2011-09-08 MED ORDER — FLUTICASONE FUROATE 27.5 MCG/SPRAY NA SUSP: 2.0000 | Freq: Every day | NASAL | Status: DC

## 2011-09-08 MED ORDER — SODIUM CHLORIDE 0.9 % IV SOLN: 250.0000 mL | INTRAVENOUS | Status: DC

## 2011-09-08 MED ORDER — GLYBURIDE 5 MG PO TABS
5.0000 mg | ORAL_TABLET | Freq: Every day | ORAL | Status: DC
  Administered 2011-09-09 – 2011-09-12 (×4): 5 mg via ORAL
  Filled 2011-09-08 (×5): qty 1

## 2011-09-08 MED ORDER — KCL IN DEXTROSE-NACL 20-5-0.45 MEQ/L-%-% IV SOLN
INTRAVENOUS | Status: DC
  Administered 2011-09-08 – 2011-09-09 (×2): via INTRAVENOUS
  Filled 2011-09-08 (×8): qty 1000

## 2011-09-08 MED ORDER — FENTANYL CITRATE 0.05 MG/ML IJ SOLN
INTRAMUSCULAR | Status: DC | PRN
  Administered 2011-09-08: 150 ug via INTRAVENOUS
  Administered 2011-09-08: 50 ug via INTRAVENOUS
  Administered 2011-09-08 (×2): 100 ug via INTRAVENOUS

## 2011-09-08 MED ORDER — ACYCLOVIR 5 % EX OINT
1.0000 "application " | TOPICAL_OINTMENT | Freq: Every day | CUTANEOUS | Status: DC | PRN
  Filled 2011-09-08: qty 30

## 2011-09-08 MED ORDER — PROPOFOL 10 MG/ML IV BOLUS
INTRAVENOUS | Status: DC | PRN
  Administered 2011-09-08: 100 mg via INTRAVENOUS

## 2011-09-08 MED ORDER — ACETAMINOPHEN 650 MG RE SUPP: 650.0000 mg | RECTAL | Status: DC | PRN

## 2011-09-08 MED ORDER — SODIUM CHLORIDE 0.9 % IR SOLN
Status: DC | PRN
  Administered 2011-09-08: 17:00:00

## 2011-09-08 MED ORDER — OXYCODONE-ACETAMINOPHEN 5-325 MG PO TABS
1.0000 | ORAL_TABLET | ORAL | Status: DC | PRN
  Administered 2011-09-08 – 2011-09-10 (×2): 1 via ORAL
  Filled 2011-09-08: qty 2
  Filled 2011-09-08 (×2): qty 1

## 2011-09-08 MED ORDER — SODIUM CHLORIDE 0.9 % IV SOLN
INTRAVENOUS | Status: AC
  Filled 2011-09-08: qty 500

## 2011-09-08 MED ORDER — ONDANSETRON HCL 4 MG/2ML IJ SOLN: 4.0000 mg | INTRAMUSCULAR | Status: DC | PRN

## 2011-09-08 MED ORDER — METOPROLOL SUCCINATE ER 25 MG PO TB24
25.0000 mg | ORAL_TABLET | Freq: Every day | ORAL | Status: DC
  Administered 2011-09-10 – 2011-09-12 (×3): 25 mg via ORAL
  Filled 2011-09-08 (×5): qty 1

## 2011-09-08 MED ORDER — ZOLPIDEM TARTRATE 5 MG PO TABS
5.0000 mg | ORAL_TABLET | Freq: Every evening | ORAL | Status: DC | PRN
  Administered 2011-09-08 – 2011-09-10 (×2): 5 mg via ORAL
  Filled 2011-09-08 (×3): qty 1

## 2011-09-08 MED ORDER — KETOCONAZOLE 2 % EX SHAM
MEDICATED_SHAMPOO | CUTANEOUS | Status: DC
  Filled 2011-09-08: qty 120

## 2011-09-08 MED ORDER — RAMIPRIL 10 MG PO CAPS
10.0000 mg | ORAL_CAPSULE | Freq: Every day | ORAL | Status: DC
  Administered 2011-09-08 – 2011-09-12 (×4): 10 mg via ORAL
  Filled 2011-09-08 (×6): qty 1

## 2011-09-08 MED ORDER — MORPHINE SULFATE 2 MG/ML IJ SOLN
INTRAMUSCULAR | Status: AC
  Administered 2011-09-08: 2 mg via INTRAVENOUS
  Filled 2011-09-08: qty 1

## 2011-09-08 MED ORDER — LACTATED RINGERS IV SOLN
INTRAVENOUS | Status: DC | PRN
  Administered 2011-09-08: 16:00:00 via INTRAVENOUS

## 2011-09-08 MED ORDER — PANTOPRAZOLE SODIUM 40 MG PO TBEC
40.0000 mg | DELAYED_RELEASE_TABLET | Freq: Every day | ORAL | Status: DC
  Administered 2011-09-08 – 2011-09-12 (×5): 40 mg via ORAL
  Filled 2011-09-08 (×3): qty 1

## 2011-09-08 MED ORDER — DICLOFENAC SODIUM 75 MG PO TBEC
75.0000 mg | DELAYED_RELEASE_TABLET | Freq: Two times a day (BID) | ORAL | Status: DC
  Administered 2011-09-08 – 2011-09-12 (×8): 75 mg via ORAL
  Filled 2011-09-08 (×10): qty 1

## 2011-09-08 MED ORDER — INSULIN ASPART 100 UNIT/ML ~~LOC~~ SOLN
0.0000 [IU] | Freq: Three times a day (TID) | SUBCUTANEOUS | Status: DC
  Administered 2011-09-10: 17:00:00 via SUBCUTANEOUS
  Administered 2011-09-10: 3 [IU] via SUBCUTANEOUS
  Administered 2011-09-11: 2 [IU] via SUBCUTANEOUS
  Administered 2011-09-11: 5 [IU] via SUBCUTANEOUS
  Administered 2011-09-12: 2 [IU] via SUBCUTANEOUS
  Filled 2011-09-08: qty 3
  Filled 2011-09-08: qty 0.15

## 2011-09-08 MED ORDER — LIDOCAINE HCL (CARDIAC) 20 MG/ML IV SOLN
INTRAVENOUS | Status: DC | PRN
  Administered 2011-09-08: 80 mg via INTRAVENOUS

## 2011-09-08 MED ORDER — THROMBIN 20000 UNITS EX KIT
PACK | CUTANEOUS | Status: DC | PRN
  Administered 2011-09-08: 17:00:00 via TOPICAL

## 2011-09-08 MED ORDER — EPHEDRINE SULFATE 50 MG/ML IJ SOLN
INTRAMUSCULAR | Status: DC | PRN
  Administered 2011-09-08 (×2): 10 mg via INTRAVENOUS

## 2011-09-08 MED ORDER — LIDOCAINE-EPINEPHRINE 1 %-1:100000 IJ SOLN
INTRAMUSCULAR | Status: DC | PRN
  Administered 2011-09-08: 5 mL

## 2011-09-08 MED ORDER — METFORMIN HCL 500 MG PO TABS
1000.0000 mg | ORAL_TABLET | Freq: Two times a day (BID) | ORAL | Status: DC
  Administered 2011-09-09 – 2011-09-12 (×7): 1000 mg via ORAL
  Filled 2011-09-08 (×9): qty 2

## 2011-09-08 MED ORDER — OLOPATADINE HCL 0.1 % OP SOLN
1.0000 [drp] | Freq: Two times a day (BID) | OPHTHALMIC | Status: DC
  Administered 2011-09-08 – 2011-09-12 (×7): 1 [drp] via OPHTHALMIC
  Filled 2011-09-08: qty 5

## 2011-09-08 MED ORDER — GUAIFENESIN ER 600 MG PO TB12
600.0000 mg | ORAL_TABLET | Freq: Every day | ORAL | Status: DC
  Administered 2011-09-08 – 2011-09-12 (×4): 600 mg via ORAL
  Filled 2011-09-08 (×6): qty 1

## 2011-09-08 MED ORDER — DEXAMETHASONE SODIUM PHOSPHATE 4 MG/ML IJ SOLN
INTRAMUSCULAR | Status: DC | PRN
  Administered 2011-09-08: 10 mg via INTRAVENOUS

## 2011-09-08 MED ORDER — ATORVASTATIN CALCIUM 40 MG PO TABS
40.0000 mg | ORAL_TABLET | Freq: Every day | ORAL | Status: DC
  Administered 2011-09-09 – 2011-09-11 (×3): 40 mg via ORAL
  Filled 2011-09-08 (×4): qty 1

## 2011-09-08 MED ORDER — FLUTICASONE PROPIONATE 50 MCG/ACT NA SUSP
2.0000 | Freq: Every day | NASAL | Status: DC
  Administered 2011-09-08 – 2011-09-11 (×4): 2 via NASAL
  Filled 2011-09-08: qty 16

## 2011-09-08 MED ORDER — INSULIN ASPART 100 UNIT/ML ~~LOC~~ SOLN
0.0000 [IU] | Freq: Every day | SUBCUTANEOUS | Status: DC
  Administered 2011-09-08: 3 [IU] via SUBCUTANEOUS
  Filled 2011-09-08: qty 3
  Filled 2011-09-08: qty 0.05

## 2011-09-08 MED ORDER — FERROUS SULFATE 325 (65 FE) MG PO TABS
325.0000 mg | ORAL_TABLET | Freq: Two times a day (BID) | ORAL | Status: DC
  Administered 2011-09-08 – 2011-09-12 (×7): 325 mg via ORAL
  Filled 2011-09-08 (×11): qty 1

## 2011-09-08 MED ORDER — CEFAZOLIN SODIUM 1-5 GM-% IV SOLN
1.0000 g | Freq: Three times a day (TID) | INTRAVENOUS | Status: AC
  Administered 2011-09-08 – 2011-09-09 (×2): 1 g via INTRAVENOUS
  Filled 2011-09-08 (×2): qty 50

## 2011-09-08 MED ORDER — DIAZEPAM 5 MG PO TABS
5.0000 mg | ORAL_TABLET | Freq: Four times a day (QID) | ORAL | Status: DC | PRN
  Administered 2011-09-10 – 2011-09-11 (×2): 5 mg via ORAL
  Filled 2011-09-08 (×2): qty 1

## 2011-09-08 MED ORDER — BUPIVACAINE HCL (PF) 0.5 % IJ SOLN
INTRAMUSCULAR | Status: DC | PRN
  Administered 2011-09-08: 5 mL

## 2011-09-08 MED ORDER — SODIUM CHLORIDE 0.9 % IV SOLN
INTRAVENOUS | Status: DC | PRN
  Administered 2011-09-08: 17:00:00 via INTRAVENOUS

## 2011-09-08 MED ORDER — HYDROCODONE-ACETAMINOPHEN 5-325 MG PO TABS
1.0000 | ORAL_TABLET | ORAL | Status: DC | PRN
  Administered 2011-09-11: 1 via ORAL
  Filled 2011-09-08: qty 1

## 2011-09-08 MED ORDER — MORPHINE SULFATE 2 MG/ML IJ SOLN
1.0000 mg | INTRAMUSCULAR | Status: DC | PRN
Start: 2011-09-08 — End: 2011-09-12
  Administered 2011-09-08: 4 mg via INTRAVENOUS
  Administered 2011-09-08: 2 mg via INTRAVENOUS
  Filled 2011-09-08: qty 2

## 2011-09-08 MED ORDER — 0.9 % SODIUM CHLORIDE (POUR BTL) OPTIME
TOPICAL | Status: DC | PRN
  Administered 2011-09-08: 1000 mL

## 2011-09-08 MED ORDER — PHENOL 1.4 % MT LIQD: 1.0000 | OROMUCOSAL | Status: DC | PRN

## 2011-09-08 MED ORDER — HETASTARCH-ELECTROLYTES 6 % IV SOLN
INTRAVENOUS | Status: DC | PRN
  Administered 2011-09-08: 17:00:00 via INTRAVENOUS

## 2011-09-08 MED ORDER — TESTOSTERONE CYPIONATE 200 MG/ML IM SOLN: 200.0000 mg | INTRAMUSCULAR | Status: DC

## 2011-09-08 MED ORDER — BACITRACIN 50000 UNITS IM SOLR
INTRAMUSCULAR | Status: AC
  Filled 2011-09-08: qty 1

## 2011-09-08 MED ORDER — SODIUM CHLORIDE 0.9 % IJ SOLN: 3.0000 mL | INTRAMUSCULAR | Status: DC | PRN

## 2011-09-08 MED ORDER — ALUM & MAG HYDROXIDE-SIMETH 200-200-20 MG/5ML PO SUSP
30.0000 mL | Freq: Four times a day (QID) | ORAL | Status: DC | PRN
  Administered 2011-09-09: 30 mL via ORAL
  Filled 2011-09-08: qty 30

## 2011-09-08 SURGICAL SUPPLY — 77 items
ADH SKN CLS APL DERMABOND .7 (GAUZE/BANDAGES/DRESSINGS) ×2
APL SKNCLS STERI-STRIP NONHPOA (GAUZE/BANDAGES/DRESSINGS)
BAG DECANTER FOR FLEXI CONT (MISCELLANEOUS) ×2 IMPLANT
BENZOIN TINCTURE PRP APPL 2/3 (GAUZE/BANDAGES/DRESSINGS) IMPLANT
BLADE SURG 11 STRL SS (BLADE) ×1 IMPLANT
BLADE SURG ROTATE 9660 (MISCELLANEOUS) IMPLANT
BLADE ULTRA TIP 2M (BLADE) IMPLANT
BUR MATCHSTICK NEURO 3.0 LAGG (BURR) ×1 IMPLANT
BUR ROUND FLUTED 5 RND (BURR) ×1 IMPLANT
CANISTER SUCTION 2500CC (MISCELLANEOUS) ×2 IMPLANT
CLOTH BEACON ORANGE TIMEOUT ST (SAFETY) ×2 IMPLANT
CONT SPEC 4OZ CLIKSEAL STRL BL (MISCELLANEOUS) ×2 IMPLANT
DERMABOND ADVANCED (GAUZE/BANDAGES/DRESSINGS) ×2
DERMABOND ADVANCED .7 DNX12 (GAUZE/BANDAGES/DRESSINGS) IMPLANT
DRAPE LAPAROTOMY 100X72 PEDS (DRAPES) IMPLANT
DRAPE LAPAROTOMY 100X72X124 (DRAPES) ×2 IMPLANT
DRAPE MICROSCOPE LEICA (MISCELLANEOUS) ×2 IMPLANT
DRAPE ORTHO SPLIT 77X108 STRL (DRAPES)
DRAPE POUCH INSTRU U-SHP 10X18 (DRAPES) ×2 IMPLANT
DRAPE SURG ORHT 6 SPLT 77X108 (DRAPES) IMPLANT
DRESSING TELFA 8X3 (GAUZE/BANDAGES/DRESSINGS) ×2 IMPLANT
DURAPREP 6ML APPLICATOR 50/CS (WOUND CARE) IMPLANT
ELECT REM PT RETURN 9FT ADLT (ELECTROSURGICAL) ×2
ELECTRODE REM PT RTRN 9FT ADLT (ELECTROSURGICAL) ×1 IMPLANT
EVACUATOR 1/8 PVC DRAIN (DRAIN) ×2 IMPLANT
GAUZE SPONGE 4X4 16PLY XRAY LF (GAUZE/BANDAGES/DRESSINGS) IMPLANT
GLOVE BIO SURGEON STRL SZ7 (GLOVE) ×2 IMPLANT
GLOVE BIO SURGEON STRL SZ8 (GLOVE) ×3 IMPLANT
GLOVE BIOGEL PI IND STRL 7.0 (GLOVE) IMPLANT
GLOVE BIOGEL PI IND STRL 8 (GLOVE) ×1 IMPLANT
GLOVE BIOGEL PI IND STRL 8.5 (GLOVE) ×1 IMPLANT
GLOVE BIOGEL PI INDICATOR 7.0 (GLOVE) ×2
GLOVE BIOGEL PI INDICATOR 8 (GLOVE) ×2
GLOVE BIOGEL PI INDICATOR 8.5 (GLOVE) ×1
GLOVE ECLIPSE 7.5 STRL STRAW (GLOVE) ×5 IMPLANT
GLOVE EXAM NITRILE LRG STRL (GLOVE) IMPLANT
GLOVE EXAM NITRILE MD LF STRL (GLOVE) IMPLANT
GLOVE EXAM NITRILE XL STR (GLOVE) IMPLANT
GLOVE EXAM NITRILE XS STR PU (GLOVE) IMPLANT
GOWN BRE IMP SLV AUR LG STRL (GOWN DISPOSABLE) IMPLANT
GOWN BRE IMP SLV AUR XL STRL (GOWN DISPOSABLE) IMPLANT
GOWN STRL REIN 2XL LVL4 (GOWN DISPOSABLE) IMPLANT
HEMOSTAT SURGICEL 2X14 (HEMOSTASIS) IMPLANT
KIT BASIN OR (CUSTOM PROCEDURE TRAY) ×2 IMPLANT
KIT ROOM TURNOVER OR (KITS) ×2 IMPLANT
MARKER SKIN DUAL TIP RULER LAB (MISCELLANEOUS) ×1 IMPLANT
NDL HYPO 25X1 1.5 SAFETY (NEEDLE) ×1 IMPLANT
NDL SPNL 18GX3.5 QUINCKE PK (NEEDLE) IMPLANT
NDL SPNL 22GX3.5 QUINCKE BK (NEEDLE) ×1 IMPLANT
NEEDLE HYPO 25X1 1.5 SAFETY (NEEDLE) ×4 IMPLANT
NEEDLE SPNL 18GX3.5 QUINCKE PK (NEEDLE) ×6 IMPLANT
NEEDLE SPNL 22GX3.5 QUINCKE BK (NEEDLE) ×2 IMPLANT
NS IRRIG 1000ML POUR BTL (IV SOLUTION) ×2 IMPLANT
PACK LAMINECTOMY NEURO (CUSTOM PROCEDURE TRAY) ×2 IMPLANT
PATTIES SURGICAL .25X.25 (GAUZE/BANDAGES/DRESSINGS) IMPLANT
PATTIES SURGICAL .5 X.5 (GAUZE/BANDAGES/DRESSINGS) IMPLANT
PATTIES SURGICAL .5 X3 (DISPOSABLE) IMPLANT
RUBBERBAND STERILE (MISCELLANEOUS) ×4 IMPLANT
SPONGE GAUZE 4X4 12PLY (GAUZE/BANDAGES/DRESSINGS) ×2 IMPLANT
SPONGE LAP 4X18 X RAY DECT (DISPOSABLE) IMPLANT
SPONGE NEURO XRAY DETECT 1X3 (DISPOSABLE) IMPLANT
SPONGE SURGIFOAM ABS GEL 100 (HEMOSTASIS) ×1 IMPLANT
STAPLER SKIN PROX WIDE 3.9 (STAPLE) IMPLANT
STRIP CLOSURE SKIN 1/2X4 (GAUZE/BANDAGES/DRESSINGS) IMPLANT
SUT NURALON 4 0 TR CR/8 (SUTURE) IMPLANT
SUT SILK 6 0 BV 1XDISCX (SUTURE) IMPLANT
SUT VIC AB 0 CT1 18XCR BRD8 (SUTURE) ×1 IMPLANT
SUT VIC AB 0 CT1 8-18 (SUTURE) ×2
SUT VIC AB 2-0 CT1 18 (SUTURE) ×2 IMPLANT
SUT VIC AB 3-0 SH 8-18 (SUTURE) ×2 IMPLANT
SYR 20ML ECCENTRIC (SYRINGE) ×3 IMPLANT
TIP SONASTAR STD MISONIX 1.9 (TRAY / TRAY PROCEDURE) IMPLANT
TOWEL OR 17X24 6PK STRL BLUE (TOWEL DISPOSABLE) ×1 IMPLANT
TOWEL OR 17X26 10 PK STRL BLUE (TOWEL DISPOSABLE) ×1 IMPLANT
TRAY FOLEY CATH 14FRSI W/METER (CATHETERS) ×1 IMPLANT
UNDERPAD 30X30 INCONTINENT (UNDERPADS AND DIAPERS) ×1 IMPLANT
WATER STERILE IRR 1000ML POUR (IV SOLUTION) ×2 IMPLANT

## 2011-09-08 NOTE — Interval H&P Note (Signed)
History and Physical Interval Note:  09/08/2011 6:41 AM  Steven [name]  has presented today for surgery, with the diagnosis of Lumbago, Epidural mass  The various methods of treatment have been discussed with the patient and family. After consideration of risks, benefits and other options for treatment, the patient has consented to  Procedure(s) (LRB): LUMBAR LAMINECTOMY FOR TUMOR (Left) as a surgical intervention .  The patients' history has been reviewed, patient examined, no change in status, stable for surgery.  I have reviewed the patients' chart and labs.  Questions were answered to the patient's satisfaction.     [name] D  Date of Initial H&P: 09/07/2011  History reviewed, patient examined, no change in status, stable for surgery.

## 2011-09-08 NOTE — Preoperative (Signed)
Beta Blockers   Reason not to administer Beta Blockers:Not Applicable 

## 2011-09-08 NOTE — Anesthesia Postprocedure Evaluation (Signed)
Anesthesia Post Note  Patient: Steven [name]  Procedure(s) Performed: Procedure(s) (LRB): LUMBAR LAMINECTOMY FOR TUMOR (Left)  Anesthesia type: general  Patient location: PACU  Post pain: Pain level controlled  Post assessment: Patient's Cardiovascular Status Stable  Last Vitals:  Filed Vitals:   09/08/11 1815  BP:   Pulse:   Temp: 36.3 C  Resp:     Post vital signs: Reviewed and stable  Level of consciousness: sedated  Complications: No apparent anesthesia complications

## 2011-09-08 NOTE — Progress Notes (Signed)
Patient awake alert, conversant.  Full strength bilateral lower extremities.  Doing well.

## 2011-09-08 NOTE — Addendum Note (Signed)
Addendum  created 09/08/11 1854 by Atilano Ina, CRNA   Modules edited:Notes Section

## 2011-09-08 NOTE — Anesthesia Procedure Notes (Signed)
Procedure Name: Intubation Date/Time: 09/08/2011 3:56 PM Performed by: Alanda Amass A Pre-anesthesia Checklist: Patient identified, Timeout performed, Emergency Drugs available, Suction available and Patient being monitored Patient Re-evaluated:Patient Re-evaluated prior to inductionOxygen Delivery Method: Circle system utilized Preoxygenation: Pre-oxygenation with 100% oxygen Intubation Type: IV induction Ventilation: Mask ventilation without difficulty Laryngoscope Size: Mac and 3 Grade View: Grade I Tube type: Oral Tube size: 7.5 mm Number of attempts: 1 Airway Equipment and Method: Lighted stylet Placement Confirmation: ETT inserted through vocal cords under direct vision,  positive ETCO2 and breath sounds checked- equal and bilateral Secured at: 21 cm Tube secured with: Tape Dental Injury: Teeth and Oropharynx as per pre-operative assessment

## 2011-09-08 NOTE — Anesthesia Preprocedure Evaluation (Addendum)
Anesthesia Evaluation  Patient identified by MRN, date of birth, ID band Patient awake    Reviewed: Allergy & Precautions, H&P , NPO status   Airway Mallampati: II TM Distance: >3 FB Neck ROM: Full    Dental  (+) Dental Advisory Given   Pulmonary pneumonia ,  breath sounds clear to auscultation        Cardiovascular + CAD and + Past MI + dysrhythmias Rhythm:Regular Rate:Normal  History obtained form chart and patient. Patient reports That work up last year with Dr. Dietrich Pates went well and no further work up was indicated.   Neuro/Psych negative neurological ROS     GI/Hepatic Neg liver ROS, GERD-  Controlled,  Endo/Other  Diabetes mellitus-, Type 2  Renal/GU negative Renal ROS     Musculoskeletal negative musculoskeletal ROS (+)   Abdominal   Peds  Hematology   Anesthesia Other Findings   Reproductive/Obstetrics                        Anesthesia Physical Anesthesia Plan  ASA: III  Anesthesia Plan: General   Post-op Pain Management:    Induction: Intravenous  Airway Management Planned: Oral ETT  Additional Equipment:   Intra-op Plan:   Post-operative Plan: Extubation in OR  Informed Consent:   Plan Discussed with:   Anesthesia Plan Comments:        Anesthesia Quick Evaluation

## 2011-09-08 NOTE — Transfer of Care (Signed)
Immediate Anesthesia Transfer of Care Note  Patient: Steven [name]  Procedure(s) Performed: Procedure(s) (LRB): LUMBAR LAMINECTOMY FOR TUMOR (Left)  Patient Location: PACU  Anesthesia Type: General  Level of Consciousness: awake  Airway & Oxygen Therapy: Patient Spontanous Breathing and Patient connected to nasal cannula oxygen  Post-op Assessment: Report given to PACU RN and Post -op Vital signs reviewed and stable  Post vital signs: Reviewed and stable  Complications: No apparent anesthesia complications

## 2011-09-08 NOTE — Op Note (Signed)
09/08/2011  6:07 PM  PATIENT:  Steven [name]  78 y.o. male  PRE-OPERATIVE DIAGNOSIS:   Epidural mass, bilateral lower extremity weakness, spinal stenosis and spondylosis with myelopathy T12/L1  POST-OPERATIVE DIAGNOSIS:  Epidural mass, bilateral lower extremity weakness, spinal stenosis and spondylosis with myelopathy T12/L1  PROCEDURE:  Procedure(s) (LRB): Thoracic and LUMBAR LAMINECTOMY FOR TUMOR (Left) with microdissection  SURGEON:  Surgeon(s) and Role:    * Maeola Harman, MD - Primary  PHYSICIAN ASSISTANT: Yetta Barre, MD  ASSISTANTS: none   ANESTHESIA:   general  EBL:  Total I/O In: 1950 [I.V.:1450; IV Piggyback:500] Out: 645 [Urine:345; Blood:300]  BLOOD ADMINISTERED:none  DRAINS: (Medium) Hemovact drain(s) in the epidural space with  Suction Open   LOCAL MEDICATIONS USED:  LIDOCAINE   SPECIMEN:  Source of Specimen:  epidural mass  DISPOSITION OF SPECIMEN:  PATHOLOGY  COUNTS:  YES  TOURNIQUET:  * No tourniquets in log *  DICTATION: DICTATION: Patient has a largeepidural spinal mass at the T12/L1 level with cord compression and myelopathy. It was elected to take him to surgery for T12-L1 laminectomy and resection of epidural mass.  Procedure: Patient was brought to the operating room and following the smooth and uncomplicated induction of general endotracheal anesthesia he was placed in a prone position on the Wilson frame. Initial localizing Xray was obtained with needles taped to the patient's back. Low to mid back was prepped and draped in the usual sterile fashion with DuraPrep. Area of planned incision was infiltrated with local lidocaine. Incision was made in the midline and carried to the thoracolumbar fascia which was incised bilaterally. Subperiosteal dissection was performed exposing what was felt to be T12 and L1 levels. Intraoperative x-rays demonstrated marker probes at L1 pedicle. A total laminectomy of L1 was performed a high-speed drill and completed with  Kerrison rongeurs and a generous foraminotomy was performed overlying the superior aspect of the L2 lamina and inferior aspect of T12 laminae. Ligamentum flavum was detached and removed in a piecemeal fashion and the spinal cord dura was decompressed laterally with removal of the superior aspect of the facet and ligamentum causing nerve root and cord compression. The spinal cord dura appeared infolded on itself and this improved after decompression was completed. The microscope was brought into the field and theL2 nerve root was mobilized medially. This exposed fibrocartilagenous material and a free fragment of herniated disc material. Multiple fragments were removed and fine ball-tipped probes were passed under the thecal sac and I was able to palpate additional material, which seemed consistent with herniated disc material.  At this point it was felt that all neural elements were well decompressed and there was no evidence of residual loose disc material within the epidural space.  I was concerned that further manipulation would put the patient's spinal cord at risk and that the cord had by now been sufficiently decompressed. The interspace was then irrigated with bacitracin saline and no additional disc material was mobilized. Hemostasis was assured with bipolar electrocautery , bone wax and gelfoam. A medium Hemovac drain was placed in the epidural space.The lumbodorsal fascia was closed with 0 Vicryl sutures the subcutaneous tissues reapproximated 2-0 Vicryl inverted sutures and the skin edges were reapproximated with 3-0 Vicryl subcuticular stitch. The wound is dressed with Dermabond. Patient was extubated in the operating room and taken to recovery in stable and satisfactory condition having tolerated his operation well counts were correct at the end of the case.  PLAN OF CARE: Admit to inpatient   PATIENT  DISPOSITION:  PACU - hemodynamically stable.   Delay start of Pharmacological VTE agent (>24hrs)  due to surgical blood loss or risk of bleeding: yes

## 2011-09-08 NOTE — Anesthesia Postprocedure Evaluation (Signed)
  Anesthesia Post-op Note  Patient: Steven [name]  Procedure(s) Performed: Procedure(s) (LRB): LUMBAR LAMINECTOMY FOR TUMOR (Left)  Patient Location: PACU  Anesthesia Type: General  Level of Consciousness: awake and alert   Airway and Oxygen Therapy: Patient Spontanous Breathing and Patient connected to nasal cannula oxygen  Post-op Pain: none  Post-op Assessment: Post-op Vital signs reviewed, Patient's Cardiovascular Status Stable, Respiratory Function Stable, Patent Airway, No signs of Nausea or vomiting and Pain level controlled  Post-op Vital Signs: Reviewed and stable  Complications: No apparent anesthesia complications

## 2011-09-09 LAB — GLUCOSE, CAPILLARY
Glucose-Capillary: 197 mg/dL — ABNORMAL HIGH (ref 70–99)
Glucose-Capillary: 83 mg/dL (ref 70–99)

## 2011-09-09 NOTE — Evaluation (Signed)
Physical Therapy Evaluation Patient Details Name: KAMALI NEPHEW MRN: [id number] DOB: 06/10/1930 Today's Date: 09/09/2011 Time: 1191-4782 PT Time Calculation (min): 37 min  PT Assessment / Plan / Recommendation Clinical Impression  Pt presents s/p lumbar laminectomy with tumor excision. Pt is near baseline level although requires increased assistance and safety for balance during all mobility. Pt will benefit from skilled PT in the acute care setting in order to maximize functional mobility and safety prior to d/c    PT Assessment  Patient needs continued PT services    Follow Up Recommendations  Home health PT;Supervision for mobility/OOB    Barriers to Discharge        lEquipment Recommendations  None recommended by PT    Recommendations for Other Services     Frequency Min 5X/week    Precautions / Restrictions Precautions Precautions: Back Precaution Booklet Issued: Yes (comment) Precaution Comments: pt educated on 3/3 back precautions Restrictions Weight Bearing Restrictions: No         Mobility  Bed Mobility Bed Mobility: Not assessed Transfers Transfers: Sit to Stand;Stand to Sit Sit to Stand: 4: Min assist;With upper extremity assist;From chair/3-in-1 Stand to Sit: 4: Min assist;With upper extremity assist;To chair/3-in-1 Details for Transfer Assistance: VC for hand placement and anterior translation to prevent bending while standing up. Pt able to control descent Ambulation/Gait Ambulation/Gait Assistance: 4: Min assist;4: Min Government social research officer (Feet): 250 Feet Assistive device: Rolling walker;None Ambulation/Gait Assistance Details: Minguard with RW, Min assist without RW for stability. VC for safety and distance to RW. Pt prefers RW for stability although required max cues for safe posture. Gait Pattern: Step-to pattern;Trunk flexed;Decreased hip/knee flexion - right;Decreased hip/knee flexion - left;Decreased stride length Gait velocity: decreased  gait speed Stairs: Yes Stairs Assistance: 5: Supervision Stairs Assistance Details (indicate cue type and reason): VC for proper sequencing and safety on stairs Stair Management Technique: Two rails;Forwards;Step to pattern;Alternating pattern Number of Stairs: 5     Exercises     PT Diagnosis: Difficulty walking;Acute pain  PT Problem List: Decreased activity tolerance;Decreased balance;Decreased mobility;Decreased knowledge of use of DME;Decreased safety awareness;Decreased knowledge of precautions;Pain PT Treatment Interventions: DME instruction;Gait training;Stair training;Functional mobility training;Therapeutic activities;Balance training;Patient/family education   PT Goals Acute Rehab PT Goals PT Goal Formulation: With patient Time For Goal Achievement: 09/16/11 Potential to Achieve Goals: Good Pt will go Supine/Side to Sit: with modified independence PT Goal: Supine/Side to Sit - Progress: Goal set today Pt will go Sit to Supine/Side: with modified independence PT Goal: Sit to Supine/Side - Progress: Goal set today Pt will go Sit to Stand: with modified independence PT Goal: Sit to Stand - Progress: Goal set today Pt will go Stand to Sit: with modified independence PT Goal: Stand to Sit - Progress: Goal set today Pt will Transfer Bed to Chair/Chair to Bed: with supervision PT Transfer Goal: Bed to Chair/Chair to Bed - Progress: Goal set today Pt will Ambulate: >150 feet;with supervision;with least restrictive assistive device PT Goal: Ambulate - Progress: Goal set today Pt will Go Up / Down Stairs: Flight;with supervision;with rail(s) PT Goal: Up/Down Stairs - Progress: Goal set today  Visit Information  Last PT Received On: 09/09/11 Assistance Needed: +1    Subjective Data      Prior Functioning  Home Living Lives With: Spouse Available Help at Discharge: Family;Available 24 hours/day Type of Home: House Home Access: Stairs to enter [employer] of  Steps: 3 Entrance Stairs-Rails: Left Home Layout: Two level Alternate Level Stairs-Number of Steps:  13 Alternate Level Stairs-Rails: Can reach both Bathroom Shower/Tub: Door;Walk-in Stage manager: Standard Bathroom Accessibility: Yes How Accessible: Accessible via walker Home Adaptive Equipment: Grab bars in shower;Walker - rolling;Straight cane;Shower chair without back Prior Function Level of Independence: Independent with assistive device(s) (only past three months) Able to Take Stairs?: Yes Driving: Yes (for the past three months) Vocation: Full time employment Comments: Engineer, production Communication: No difficulties Dominant Hand: Right    Cognition  Overall Cognitive Status: Appears within functional limits for tasks assessed/performed Arousal/Alertness: Awake/alert Orientation Level: Appears intact for tasks assessed Behavior During Session: [facility] for tasks performed    Extremity/Trunk Assessment Right Lower Extremity Assessment RLE ROM/Strength/Tone: Within functional levels RLE Sensation: WFL - Light Touch Left Lower Extremity Assessment LLE ROM/Strength/Tone: Within functional levels LLE Sensation: WFL - Light Touch   Balance    End of Session PT - End of Session Equipment Utilized During Treatment: Gait belt Activity Tolerance: Patient tolerated treatment well Patient left: in chair;with call bell/phone within reach Nurse Communication: Mobility status   Milana Kidney 09/09/2011, 4:37 PM  09/09/2011 Milana Kidney DPT PAGER: [phone] OFFICE: [phone]

## 2011-09-09 NOTE — Progress Notes (Signed)
CSW consulted for SNF. PT recommendation for HHPT with supervision for mobility noted. CSW signing off as no other CSW needs identified. Please re-consult if SNF needed. Dellie Burns, MSW, [place] [phone] (weekend)

## 2011-09-09 NOTE — Progress Notes (Signed)
A syringe with 2 mL of Sublimaze 50 mcg/mL came with patient from PACU.  Wasted in sink.

## 2011-09-09 NOTE — Progress Notes (Signed)
Patient ID: Steven [name], male   DOB: [birth year], 78 y.o.   MRN: [id number] Patient looks good this morning on postop day 1. He really denies any significant pain. Hemovac is in place. Nice numbness and tingling in the legs.. instability moving his legs rather well. Has at least antigravity strength with good dorsi and plantar flexion. Foley is in place. we'll transfer to the floor today and start therapy. D/C foley.

## 2011-09-10 LAB — GLUCOSE, CAPILLARY
Glucose-Capillary: 145 mg/dL — ABNORMAL HIGH (ref 70–99)
Glucose-Capillary: 159 mg/dL — ABNORMAL HIGH (ref 70–99)
Glucose-Capillary: 188 mg/dL — ABNORMAL HIGH (ref 70–99)

## 2011-09-10 NOTE — Progress Notes (Signed)
Patient ID: Steven [name], male   DOB: Feb 06, 1931, 78 y.o.   MRN: [id number] Continued improvement in her lower shoulder function still has some numbness and status of his feet but he is ambulate with assistance. His wound is clean and dry continue use and additional day to get his strength up. Continue physical physical on outpatient therapy possible discharge tomorrow

## 2011-09-10 NOTE — Progress Notes (Signed)
Hemovac d/c'd without difficulty. Pt tolerated well. Dry dsg applied. Barbera Setters

## 2011-09-10 NOTE — Progress Notes (Signed)
Occupational Therapy Evaluation Patient Details Name: Steven [name] MRN: [id number] DOB: 1931-04-13 Today's Date: 09/10/2011 Time: 1420-1440 OT Time Calculation (min): 20 min  OT Assessment / Plan / Recommendation Clinical Impression  78 yo s/p thoracic and lumbar lam and tumor resection. Pt will benefit from skilled Ot services to max independenc with ADL and mobility for ADL adhering to back precautions with nec DME and AE.    OT Assessment  Patient needs continued OT Services    Follow Up Recommendations  No OT follow up    Barriers to Discharge None    Equipment Recommendations  None recommended by OT    Recommendations for Other Services    Frequency  Min 2X/week    Precautions / Restrictions Precautions Precautions: Back Precaution Booklet Issued: Yes (comment) Precaution Comments: pt educated on 3/3 back precautions   Pertinent Vitals/Pain Pain tolerable    ADL  Grooming: Performed;Supervision/safety;Other (comment) (not following back precautions) Upper Body Bathing: Set up Lower Body Bathing: Simulated;Moderate assistance Where Assessed - Lower Body Bathing: Unsupported sit to stand Upper Body Dressing: Simulated;Set up Where Assessed - Upper Body Dressing: Supported sitting Lower Body Dressing: Simulated;Moderate assistance Where Assessed - Lower Body Dressing: Unsupported sit to stand Toilet Transfer: Performed;Supervision/safety Toilet Transfer Method: Sit to Barista: Regular height toilet Toileting - Clothing Manipulation and Hygiene: Performed;Supervision/safety Where Assessed - Toileting Clothing Manipulation and Hygiene: Standing Transfers/Ambulation Related to ADLs: supervision ADL Comments: no knowledge of back precautions during ADL    OT Diagnosis: Generalized weakness;Acute pain  OT Problem List: Decreased strength;Decreased safety awareness;Decreased knowledge of use of DME or AE;Decreased knowledge of  precautions;Pain OT Treatment Interventions: Self-care/ADL training;DME and/or AE instruction;Therapeutic activities;Patient/family education   OT Goals Acute Rehab OT Goals OT Goal Formulation: With patient Time For Goal Achievement: 09/17/11 Potential to Achieve Goals: Good ADL Goals Pt Will Perform Lower Body Bathing: with supervision;with caregiver independent in assisting;with adaptive equipment;Unsupported;with cueing (comment type and amount);Sit to stand from chair ADL Goal: Lower Body Bathing - Progress: Goal set today Pt Will Perform Lower Body Dressing: with supervision;with caregiver independent in assisting;Sit to stand from chair;Unsupported;with adaptive equipment;with cueing (comment type and amount) ADL Goal: Lower Body Dressing - Progress: Goal set today Additional ADL Goal #1: Pt will state 3/3 back precautions independently. ADL Goal: Additional Goal #1 - Progress: Goal set today  Visit Information  Last OT Received On: 09/10/11 Assistance Needed: +1    Subjective Data   when can i drive   Prior Functioning  Home Living Lives With: Spouse Available Help at Discharge: Family;Available 24 hours/day Type of Home: House Home Access: Stairs to enter [employer] of Steps: 3 Entrance Stairs-Rails: Left Home Layout: Two level Alternate Level Stairs-Number of Steps: 13 Alternate Level Stairs-Rails: Can reach both Bathroom Shower/Tub: Door;Walk-in Stage manager: Standard Bathroom Accessibility: Yes How Accessible: Accessible via walker Home Adaptive Equipment: Grab bars in shower;Walker - rolling;Straight cane;Shower chair without back Prior Function Level of Independence: Independent with assistive device(s) (only past three months) Able to Take Stairs?: Yes Driving: Yes (for the past three months) Vocation: Full time employment Communication Communication: No difficulties Dominant Hand: Right    Cognition  Overall Cognitive Status:  Appears within functional limits for tasks assessed/performed Arousal/Alertness: Awake/alert Orientation Level: Appears intact for tasks assessed Behavior During Session: [facility] for tasks performed    Extremity/Trunk Assessment Right Upper Extremity Assessment RUE ROM/Strength/Tone: Within functional levels Left Upper Extremity Assessment LUE ROM/Strength/Tone: Within functional levels Trunk Assessment Trunk Assessment:  Other exceptions (sx restrictions)   Mobility Transfers Transfers: Sit to Stand;Stand to Sit Sit to Stand: 5: Supervision Details for Transfer Assistance: vc for back precautions   Exercise    Balance  WFL  End of Session OT - End of Session Equipment Utilized During Treatment: Gait belt Activity Tolerance: Patient tolerated treatment well Patient left: in chair;with call bell/phone within reach;with family/visitor present Nurse Communication: Mobility status   Steven [name] 09/10/2011, 5:42 PM [facility], OTR/L  [phone] 09/10/2011

## 2011-09-11 LAB — TYPE AND SCREEN: Antibody Screen: NEGATIVE

## 2011-09-11 MED ORDER — BISACODYL 5 MG PO TBEC
10.0000 mg | DELAYED_RELEASE_TABLET | Freq: Every day | ORAL | Status: DC | PRN
  Filled 2011-09-11: qty 1

## 2011-09-11 MED ORDER — DOCUSATE SODIUM 100 MG PO CAPS
100.0000 mg | ORAL_CAPSULE | Freq: Two times a day (BID) | ORAL | Status: DC
  Administered 2011-09-11 – 2011-09-12 (×2): 100 mg via ORAL
  Filled 2011-09-11: qty 1

## 2011-09-11 MED ORDER — FLEET ENEMA 7-19 GM/118ML RE ENEM: 1.0000 | ENEMA | Freq: Once | RECTAL | Status: AC | PRN

## 2011-09-11 MED ORDER — SENNA 8.6 MG PO TABS
1.0000 | ORAL_TABLET | Freq: Every day | ORAL | Status: DC | PRN
  Administered 2011-09-11: 8.6 mg via ORAL
  Filled 2011-09-11: qty 1

## 2011-09-11 MED ORDER — BISACODYL 5 MG PO TBEC
5.0000 mg | DELAYED_RELEASE_TABLET | Freq: Every day | ORAL | Status: DC | PRN
  Administered 2011-09-11: 5 mg via ORAL
  Filled 2011-09-11: qty 1

## 2011-09-11 MED FILL — Insulin Aspart Inj 100 Unit/ML: SUBCUTANEOUS | Qty: 0.03 | Status: AC

## 2011-09-11 MED FILL — Insulin Aspart Inj 100 Unit/ML: SUBCUTANEOUS | Qty: 0.02 | Status: AC

## 2011-09-11 NOTE — Progress Notes (Signed)
INITIAL ADULT NUTRITION ASSESSMENT Date: 09/11/2011   Time: 10:14 AM  Reason for Assessment: Nutrition Risk Report  ASSESSMENT: Male 78 y.o.  Dx: left T12-L1 transpedicular resection of the epidural mass  Hx:  Past Medical History  Diagnosis Date  . Diabetes mellitus     type II  . GERD (gastroesophageal reflux disease)   . Hyperlipidemia   . Anemia   . CAD (coronary artery disease) 2004    s/p inferior wall Mi 2004 with PTCA/Stent; s/p CABG 2004  . History of myocardial infarction   . Myocardial infarction   . Pneumonia     hx of   Past Surgical History  Procedure Date  . Coronary artery bypass graft 2004    LIMA to LAD; SVG to ramus intermedius; SVG to PDA/PLSA  . Tonsillectomy   . Cardiac catheterization   . Colonoscopy    Related Meds:     . atorvastatin  40 mg Oral q1800  . beta carotene w/minerals  1 tablet Oral Daily  . diclofenac  75 mg Oral BID  . ferrous sulfate  325 mg Oral BID  . fluticasone  2 spray Each Nare QHS  . glyBURIDE  5 mg Oral Q breakfast  . guaiFENesin  600 mg Oral Daily  . insulin aspart  0-15 Units Subcutaneous TID WC  . insulin aspart  0-5 Units Subcutaneous QHS  . ketoconazole   Topical 2 times weekly  . metFORMIN  1,000 mg Oral BID WC  . metoprolol succinate  25 mg Oral Daily  . olopatadine  1 drop Both Eyes BID  . pantoprazole  40 mg Oral Q1200  . ramipril  10 mg Oral Daily  . sodium chloride  3 mL Intravenous Q12H  . testosterone cypionate  200 mg Intramuscular Q28 days   Ht: 5\' 9"  (175.3 cm)  Wt: 157 lb 13.6 oz (71.6 kg)  Ideal Wt: 72.7 kg % Ideal Wt: 98%  Wt Readings from Last 15 Encounters:  09/08/11 157 lb 13.6 oz (71.6 kg)  09/08/11 157 lb 13.6 oz (71.6 kg)  09/06/11 159 lb 9 oz (72.377 kg)  08/22/11 158 lb (71.668 kg)  01/27/11 178 lb (80.74 kg)  01/25/11 178 lb (80.74 kg)  08/24/10 183 lb (83.008 kg)  06/23/10 184 lb (83.462 kg)  06/10/10 169 lb (76.658 kg)  05/16/10 185 lb (83.915 kg)  01/10/10 182 lb  (82.555 kg)  09/29/09 180 lb (81.647 kg)  09/20/09 180 lb (81.647 kg)  09/09/09 182 lb (82.555 kg)  09/07/09 183 lb (83.008 kg)  Usual Wt: 178 - 180  lb % Usual Wt: 88%  Body mass index is 23.31 kg/(m^2). Weight is WNL.  Food/Nutrition Related Hx: limited appetite PTA  Labs:  CMP     Component Value Date/Time   NA 137 09/06/2011 1210   K 4.8 09/06/2011 1210   CL 103 09/06/2011 1210   CO2 23 09/06/2011 1210   GLUCOSE 196* 09/06/2011 1210   GLUCOSE 173* 03/23/2006 0923   BUN 28* 09/06/2011 1210   CREATININE 0.76 09/06/2011 1210   CALCIUM 9.9 09/06/2011 1210   PROT 6.9 08/22/2011 0931   ALBUMIN 4.2 08/22/2011 0931   AST 30 08/22/2011 0931   ALT 31 08/22/2011 0931   ALKPHOS 55 08/22/2011 0931   BILITOT 0.6 08/22/2011 0931   GFRNONAA 85* 09/06/2011 1210   GFRAA >90 09/06/2011 1210   CBG (last 3)   Basename 09/10/11 2227 09/10/11 1631 09/10/11 1123  GLUCAP 156* 188* 76   Lab Results  Component Value Date   HGBA1C 6.4* 09/08/2011    Intake/Output Summary (Last 24 hours) at 09/11/11 1019 Last data filed at 09/10/11 1700  Gross per 24 hour  Intake      0 ml  Output    630 ml  Net   -630 ml   Diet Order: Carb Control Medium 1600 - 2000 kcal  Supplements/Tube Feeding: none  IVF:    sodium chloride   dextrose 5 % and 0.45 % NaCl with KCl 20 mEq/L Last Rate: 75 mL/hr at 09/09/11 1117   Estimated Nutritional Needs:   Kcal: 1675 - 1950 kcal Protein:  80 - 90 grams protein Fluid:  1.9 - 2.1 L/d  Lumbar laminectomy completed 5/24.  RD drawn to chart 2/2 Nutrition Risk Report for Unintentional Wt Loss. Pt reports usual weight around 180 lb. Wt down to 157 lb currently. Pt reports he was eating less 2/2 reduced appetite and limited mobility. Pt stated "I was having back trouble and thought any extra weight that I had to carry around would be unnecessary; my doctor wants me to lose weight." This is a weight change of 13% x 7 months.  Discussed that current weight is 98% of ideal weight and pt  should focus on weight maintenance at this time. Pt in agreement.  Eating well currently. Pt is at some level of nutrition risk given recent significant decline in weight.  NUTRITION DIAGNOSIS: -Inadequate oral intake (NI-2.1).  Status: Resolved  RELATED TO: limited appetite and mobility  AS EVIDENCE BY: pt report and decreasing weight.  MONITORING/EVALUATION(Goals): Goal: Pt to maintain weight and meet needs via meals and snacks. Monitor: PO intake, weights, labs, I/O's  EDUCATION NEEDS: -Education needs addressed  INTERVENTION: 1. Encouraged pt to maintain current weight  2. RD to continue to follow nutrition care plan  Dietitian #: [phone]  DOCUMENTATION CODES Per approved criteria  -Not Applicable    Adair Laundry 09/11/2011, 10:14 AM

## 2011-09-11 NOTE — Progress Notes (Signed)
Agree with treatment.   09/11/2011 Cephus Shelling, PT, DPT [phone]

## 2011-09-11 NOTE — Progress Notes (Signed)
Physical Therapy Treatment Patient Details Name: Steven [name] MRN: [id number] DOB: Oct 29, 1930 Today's Date: 09/11/2011 Time: 8295-6213 PT Time Calculation (min): 26 min  PT Assessment / Plan / Recommendation Comments on Treatment Session  Pt admitted s/p lumbar laminectomy and progressing well. Tolerated greater ambulation and stair training needing only supervision assist. Able to recall 3/3 back precautions. Completed DGI during session due to weaving and staggering during ambulation. Scored 13/24 which puts him at a risk for falls. Will continue to follow, but patient is ready for safe D/C home with HHPT once medically cleared by MD.     Follow Up Recommendations  Home health PT;Supervision for mobility/OOB    Barriers to Discharge        Equipment Recommendations  None recommended by PT (Simultaneous filing. User may not have seen previous data.)    Recommendations for Other Services    Frequency Min 5X/week   Plan Discharge plan remains appropriate;Frequency remains appropriate    Precautions / Restrictions Precautions Precautions: Back Precaution Booklet Issued: No Precaution Comments: Pt able to recall 3/3 back precautions.  Restrictions Weight Bearing Restrictions: No   Pertinent Vitals/Pain Pt reports 2/10 pain in back. Pt repositioned.     Mobility  Bed Mobility Bed Mobility: Not assessed Transfers Transfers: Sit to Stand;Stand to Sit (Trials x 3) Sit to Stand: 5: Supervision Stand to Sit: 5: Supervision Details for Transfer Assistance: Cues for safe hand palcement and sequence.  Ambulation/Gait Ambulation/Gait Assistance: 5: Supervision Ambulation Distance (Feet): 300 Feet Assistive device: Rolling walker;None (Progressed to walker out of the room. ) Ambulation/Gait Assistance Details: Cues for safety awareness and tall posture.  Gait Pattern: Step-to pattern;Trunk flexed;Decreased hip/knee flexion - right;Decreased hip/knee flexion - left;Decreased stride  length Stairs: Yes Stairs Assistance: 5: Supervision Stairs Assistance Details (indicate cue type and reason): Cues for sequence and safe hand placement.  Stair Management Technique: One rail Left;Step to pattern;Two rails (Practiced for each set of stairs in his home. ) Number of Stairs: 10  Wheelchair Mobility Wheelchair Mobility: No    Exercises     PT Diagnosis:    PT Problem List:   PT Treatment Interventions:     PT Goals Acute Rehab PT Goals PT Goal Formulation: With patient Time For Goal Achievement: 09/16/11 Potential to Achieve Goals: Good PT Goal: Sit to Stand - Progress: Progressing toward goal PT Goal: Stand to Sit - Progress: Progressing toward goal PT Goal: Ambulate - Progress: Progressing toward goal PT Goal: Up/Down Stairs - Progress: Progressing toward goal  Visit Information  Last PT Received On: 09/11/11 Assistance Needed: +1    Subjective Data  Subjective: "I need some PT before I go home. " Patient Stated Goal: Go home.    Cognition  Overall Cognitive Status: Appears within functional limits for tasks assessed/performed Arousal/Alertness: Awake/alert Orientation Level: Appears intact for tasks assessed Behavior During Session: [facility] for tasks performed    Balance  Balance Balance Assessed: Yes (Simultaneous filing. User may not have seen previous data.) Dynamic Standing Balance Dynamic Standing - Level of Assistance: 5: Stand by assistance Standardized Balance Assessment Standardized Balance Assessment: Dynamic Gait Index Dynamic Gait Index Level Surface: Mild Impairment Change in Gait Speed: Mild Impairment Gait with Horizontal Head Turns: Mild Impairment Gait with Vertical Head Turns: Moderate Impairment Gait and Pivot Turn: Moderate Impairment Step Over Obstacle: Mild Impairment Step Around Obstacles: Mild Impairment Steps: Moderate Impairment Total Score: 13  High Level Balance High Level Balance Activites:  (ADLs)  End of Session PT -  End of Session Equipment Utilized During Treatment: Gait belt Activity Tolerance: Patient tolerated treatment well Patient left: in chair;with call bell/phone within reach Nurse Communication: Mobility status    Oretha Ellis 09/11/2011, 12:43 PM

## 2011-09-11 NOTE — Progress Notes (Signed)
Occupational Therapy Treatment Patient Details Name: Steven [name] MRN: [id number] DOB: [birth year] Today's Date: 09/11/2011 Time: 4098-1191 OT Time Calculation (min): 23 min  OT Assessment / Plan / Recommendation Comments on Treatment Session Pt. progressing toward goals.  Currently he requires supervision for ADLs    Follow Up Recommendations  No OT follow up    Barriers to Discharge       Equipment Recommendations  None recommended by OT    Recommendations for Other Services    Frequency Min 2X/week   Plan Discharge plan remains appropriate    Precautions / Restrictions Precautions Precautions: Back Precaution Booklet Issued: No Precaution Comments: Pt able to recall 3/3 back precautions.  Restrictions Weight Bearing Restrictions: No   Pertinent Vitals/Pain     ADL  Upper Body Bathing: Simulated;Supervision/safety Where Assessed - Upper Body Bathing: Unsupported standing Lower Body Bathing: Simulated;Supervision/safety Where Assessed - Lower Body Bathing: Unsupported standing Lower Body Dressing: Performed;Supervision/safety Where Assessed - Lower Body Dressing: Unsupported sit to stand Tub/Shower Transfer: Performed;Supervision/safety Tub/Shower Transfer Method: Science writer: Walk in shower Transfers/Ambulation Related to ADLs: supervision ADL Comments: Pt. verbalized understanding of back precautions.  Demonstrates safe technique for ADLs.  Pt. Instructed to sit to don pants, and to not attempt this in standing.  Pt. also educated on acquisition of reacher and Hand held shower head    OT Diagnosis:    OT Problem List:   OT Treatment Interventions:     OT Goals ADL Goals ADL Goal: Lower Body Bathing - Progress: Progressing toward goals ADL Goal: Lower Body Dressing - Progress: Met ADL Goal: Additional Goal #1 - Progress: Met  Visit Information  Last OT Received On: 09/11/11 Assistance Needed: +1    Subjective Data        Prior Functioning       Cognition  Overall Cognitive Status: Appears within functional limits for tasks assessed/performed Arousal/Alertness: Awake/alert Orientation Level: Appears intact for tasks assessed Behavior During Session: [name] for tasks performed    Mobility Bed Mobility Bed Mobility: Not assessed Transfers Transfers: Sit to Stand;Stand to Sit Sit to Stand: 5: Supervision Stand to Sit: 5: Supervision Details for Transfer Assistance: Cues for safe hand palcement and sequence.    Exercises    Balance Balance Balance Assessed: Yes Dynamic Standing Balance Dynamic Standing - Level of Assistance: 5: Stand by assistance Standardized Balance Assessment Standardized Balance Assessment: Dynamic Gait Index High Level Balance High Level Balance Activites:  (ADLs)  End of Session OT - End of Session Activity Tolerance: Patient tolerated treatment well Patient left: in chair;with call bell/phone within reach;with family/visitor present   Steven [name], Steven [name] 09/11/2011, 12:32 PM

## 2011-09-11 NOTE — Progress Notes (Signed)
Subjective: Patient reports He is feeling well still slow to mobilize some numbness the doesn't feel like he stable enough to be discharged home today we'll continue noted a physical therapy and an ablation looked or discharge tomorrow  Objective: Vital signs in last 24 hours: Temp:  [98.1 F (36.7 C)-98.9 F (37.2 C)] 98.6 F (37 C) (05/27 0542) Pulse Rate:  [67-80] 70  (05/27 0542) Resp:  [18-20] 20  (05/27 0542) BP: (102-123)/(60-70) 123/70 mmHg (05/27 0542) SpO2:  [90 %-100 %] 97 % (05/27 0542)  Intake/Output from previous day: 05/26 0701 - 05/27 0700 In: -  Out: 1880 [Urine:1850; Drains:30] Intake/Output this shift:    Strength appears to be 5 out of 5 in some dorsiflexion weakness in the left baseline some decreased sensation on his feet.  Lab Results: No results found for this basename: WBC:2,HGB:2,HCT:2,PLT:2 in the last 72 hours BMET No results found for this basename: NA:2,K:2,CL:2,CO2:2,GLUCOSE:2,BUN:2,CREATININE:2,CALCIUM:2 in the last 72 hours  Studies/Results: No results found.  Assessment/Plan: Wounds clean and dry will progress immobilize the patient looked was discharged tomorrow.  LOS: 3 days     Chett Taniguchi P 09/11/2011, 8:02 AM

## 2011-09-12 ENCOUNTER — Encounter (HOSPITAL_COMMUNITY): Payer: Self-pay | Admitting: Neurosurgery

## 2011-09-12 DIAGNOSIS — D497 Neoplasm of unspecified behavior of endocrine glands and other parts of nervous system: Secondary | ICD-10-CM | POA: Diagnosis not present

## 2011-09-12 DIAGNOSIS — M545 Low back pain: Secondary | ICD-10-CM | POA: Diagnosis not present

## 2011-09-12 LAB — POCT I-STAT GLUCOSE: Operator id: 177181

## 2011-09-12 LAB — GLUCOSE, CAPILLARY
Glucose-Capillary: 120 mg/dL — ABNORMAL HIGH (ref 70–99)
Glucose-Capillary: 100 mg/dL — ABNORMAL HIGH (ref 70–99)

## 2011-09-12 MED FILL — Insulin Aspart Inj 100 Unit/ML: SUBCUTANEOUS | Qty: 0.05 | Status: AC

## 2011-09-12 MED FILL — Insulin Aspart Inj 100 Unit/ML: SUBCUTANEOUS | Qty: 0.02 | Status: AC

## 2011-09-12 NOTE — Progress Notes (Signed)
Occupational Therapy Treatment and Discharge Patient Details Name: Steven [name] MRN: [id number] DOB: [birth year] Today's Date: 09/12/2011 Time: 4098-1191 OT Time Calculation (min): 18 min  OT Assessment / Plan / Recommendation Comments on Treatment Session Pt met all goals this session and with no further acute OT needs indicated at this time.     Follow Up Recommendations  No OT follow up    Barriers to Discharge       Equipment Recommendations  None recommended by OT    Recommendations for Other Services    Frequency     Plan All goals met and education completed, patient discharged from OT services    Precautions / Restrictions Precautions Precautions: Back Precaution Comments: Pt able to recall 3/3 back precautions looking at board Restrictions Weight Bearing Restrictions: No   Pertinent Vitals/Pain No c/o pain at beginning of session; pt c/o "some" pain after bed mobility    ADL  Upper Body Bathing: Simulated;Modified independent Where Assessed - Upper Body Bathing: Unsupported standing Lower Body Bathing: Modified independent Where Assessed - Lower Body Bathing: Unsupported sit to stand Lower Body Dressing: Performed;Modified independent Where Assessed - Lower Body Dressing: Unsupported sit to stand Transfers/Ambulation Related to ADLs: Supervision with ambulation throughout room with no AD ADL Comments: Pt given multiple different BADL/IADL scenarios and asked how he would perform tasks (or not) with back precautions. Pt responded correctly to 3/3 scenarios as well as asked appropriate questions. Pt with slight preturbance in balance in standing with eyes closed (simulating bathing), but able to reach for a known stable object to steady self (like a grab bar at home).     OT Diagnosis:    OT Problem List:   OT Treatment Interventions:     OT Goals ADL Goals ADL Goal: Lower Body Bathing - Progress: Met  Visit Information  Last OT Received On:  09/12/11 Assistance Needed: +1    Subjective Data      Prior Functioning       Cognition  Overall Cognitive Status: Appears within functional limits for tasks assessed/performed Arousal/Alertness: Awake/alert Orientation Level: Appears intact for tasks assessed Behavior During Session: [name] for tasks performed    Mobility Bed Mobility Left Sidelying to Sit: 5: Supervision;HOB flat Sit to Sidelying Left: 5: Supervision;HOB flat Transfers Sit to Stand: 6: Modified independent (Device/Increase time);With armrests;From bed Stand to Sit: 6: Modified independent (Device/Increase time);To bed;To chair/3-in-1 Details for Transfer Assistance: Good hand placement and sequencing   Exercises    Balance    End of Session OT - End of Session Equipment Utilized During Treatment: Gait belt Activity Tolerance: Patient tolerated treatment well Patient left: in chair;with call bell/phone within reach;with family/visitor present   Steven [name] 09/12/2011, 9:17 AM

## 2011-09-12 NOTE — Progress Notes (Signed)
Physical Therapy Treatment Patient Details Name: Steven [name] MRN: [id number] DOB: June 05, 1930 Today's Date: 09/12/2011 Time: 7026-3785 PT Time Calculation (min): 23 min  PT Assessment / Plan / Recommendation Comments on Treatment Session  Pt admitted s/p lumbar laminectomy and continues to progress.  Able to tolerate increased ambulation distance with higher level challenges today as well as stair negotiation.  Will continue to focus on balance acutely.  Pt ready for safe d/c home once medically cleared by MD.    Follow Up Recommendations  Home health PT;Supervision for mobility/OOB    Barriers to Discharge        Equipment Recommendations  None recommended by PT    Recommendations for Other Services    Frequency Min 5X/week   Plan Discharge plan remains appropriate;Frequency remains appropriate    Precautions / Restrictions Precautions Precautions: Back Precaution Booklet Issued: No Precaution Comments: Pt able to recall 3/3 back precautions today without prompting. Restrictions Weight Bearing Restrictions: No   Pertinent Vitals/Pain N/A    Mobility  Bed Mobility Bed Mobility: Not assessed Left Sidelying to Sit: 5: Supervision;HOB flat Sit to Sidelying Left: 5: Supervision;HOB flat Transfers Transfers: Sit to Stand;Stand to Sit Sit to Stand: 6: Modified independent (Device/Increase time);With armrests;From bed Stand to Sit: 6: Modified independent (Device/Increase time);To bed;To chair/3-in-1 Details for Transfer Assistance: Good hand placement and sequencing Ambulation/Gait Ambulation/Gait Assistance: 5: Supervision Ambulation Distance (Feet): 550 Feet Assistive device: None Ambulation/Gait Assistance Details: Verbal cues for tall posture and safety/balance.  Pt still with slight veering path especially with head turns horizontal/vertical.  Practiced straight gait path during head turns (vertical and horizontal). Gait Pattern: Step-through pattern;Trunk  flexed Stairs: Yes Stairs Assistance: 5: Supervision Stairs Assistance Details (indicate cue type and reason): Verbal cues for safe sequence. Stair Management Technique: One rail Left;Step to pattern;Forwards Number of Stairs: 14  Wheelchair Mobility Wheelchair Mobility: No    Exercises     PT Diagnosis:    PT Problem List:   PT Treatment Interventions:     PT Goals Acute Rehab PT Goals PT Goal Formulation: With patient Time For Goal Achievement: 09/16/11 Potential to Achieve Goals: Good PT Goal: Sit to Stand - Progress: Met PT Goal: Stand to Sit - Progress: Met Pt will Ambulate: >150 feet;with modified independence;with least restrictive assistive device PT Goal: Ambulate - Progress: Updated due to goal met Pt will Go Up / Down Stairs: Flight;with modified independence;with least restrictive assistive device PT Goal: Up/Down Stairs - Progress: Updated due to goal met  Visit Information  Last PT Received On: 09/12/11 Assistance Needed: +1    Subjective Data  Subjective: "Oh excellent.  I'm glad you came." Patient Stated Goal: Go home.    Cognition  Overall Cognitive Status: Appears within functional limits for tasks assessed/performed Arousal/Alertness: Awake/alert Orientation Level: Appears intact for tasks assessed Behavior During Session: [facility] for tasks performed    Balance  Balance Balance Assessed: Yes Static Standing Balance Static Standing - Balance Support: No upper extremity supported Static Standing - Level of Assistance: 5: Stand by assistance Static Standing - Comment/# of Minutes: Pt performed static standing balance activities for 5-30 seconds (as indicated per exercise) with min (guard).  Cues for safety and proper performance. Single Leg Stance - Right Leg: 5  (seconds) Single Leg Stance - Left Leg: 5  (seconds) Tandem Stance - Right Leg: 30  (seconds) Tandem Stance - Left Leg: 30  (seconds) Rhomberg - Eyes Closed: 30  (seconds)  End of Session PT -  End  of Session Equipment Utilized During Treatment: Gait belt Activity Tolerance: Patient tolerated treatment well Patient left: in chair;with call bell/phone within reach Nurse Communication: Mobility status    Cephus Shelling 09/12/2011, 11:30 AM  09/12/2011 Cephus Shelling, PT, DPT [phone]

## 2011-09-12 NOTE — Discharge Summary (Signed)
Physician Discharge Summary  Patient ID: Steven [name] MRN: [id number] DOB/AGE: 1930/06/14 78 y.o.  Admit date: 09/08/2011 Discharge date: 09/12/2011  Admission Diagnoses:Epidural mass, bilateral lower extremity weakness, spinal stenosis and spondylosis with myelopathy T12/L1   Discharge Diagnoses: Epidural mass, bilateral lower extremity weakness, spinal stenosis and spondylosis with myelopathy T12/L1  Active Problems:  * No active hospital problems. *    Discharged Condition: good  Hospital Course: pt admited had surgery below - pt improved, ambulating better - worked with PT/OT and will be D/C home  Consults: None  Significant Diagnostic Studies: none  Treatments: surgery: Thoracic and LUMBAR LAMINECTOMY FOR TUMOR (Left) with microdissection   Discharge Exam: Blood pressure 107/65, pulse 72, temperature 98.2 F (36.8 C), temperature source Oral, resp. rate 18, height 5\' 9"  (1.753 m), weight 71.6 kg (157 lb 13.6 oz), SpO2 98.00%. Wound:c/d/i  Disposition: home  Discharge Orders    Future Appointments: Provider: Department: Dept Phone: Center:   10/02/2011 11:00 AM Pricilla Riffle, MD [facility] [phone] [id number]     Medication List  As of 09/12/2011 12:55 PM   TAKE these medications         acyclovir ointment 5 %   Commonly known as: ZOVIRAX   Apply 1 application topically daily as needed. For outbreak      ascorbic acid 1000 MG tablet   Commonly known as: VITAMIN C   Take 1,000 mg by mouth daily.      ascorbic acid 100 MG tablet   Commonly known as: VITAMIN C   Take 100 mg by mouth daily.      atorvastatin 40 MG tablet   Commonly known as: LIPITOR   Take 40 mg by mouth daily.      beta carotene w/minerals tablet   Take 1 tablet by mouth daily.      CINNAMON PO   Take 1 tablet by mouth every morning.      [employer] IRON 325 (65 FE) MG tablet   Generic drug: ferrous sulfate   Take 325 mg by mouth 2 (two) times daily.      diclofenac 75 MG EC  tablet   Commonly known as: VOLTAREN   Take 1 tablet (75 mg total) by mouth 2 (two) times daily.      fluticasone 27.5 MCG/SPRAY nasal spray   Commonly known as: VERAMYST   Place 2 sprays into the nose at bedtime. Each nostril.      FreeStyle Unistick II Lancets Misc   Use once daily as instructed.      glucose blood test strip   Use once daily as instructed      glyBURIDE 5 MG tablet   Commonly known as: DIABETA   Take 1 tablet (5 mg total) by mouth daily with breakfast.      guaiFENesin 600 MG 12 hr tablet   Commonly known as: MUCINEX   Take 600 mg by mouth daily.      ketoconazole 2 % shampoo   Commonly known as: NIZORAL   Apply topically 2 (two) times a week.      metFORMIN 1000 MG tablet   Commonly known as: GLUCOPHAGE   Take 1,000 mg by mouth 2 (two) times daily with a meal.      metoprolol succinate 25 MG 24 hr tablet   Commonly known as: TOPROL-XL   Take 1 tablet (25 mg total) by mouth daily.      multivitamin tablet   Take 1 tablet by mouth daily.  olopatadine 0.1 % ophthalmic solution   Commonly known as: PATANOL   Place 1 drop into both eyes 2 (two) times daily.      omeprazole 20 MG capsule   Commonly known as: PRILOSEC   Take 1 capsule (20 mg total) by mouth 2 (two) times daily.      ramipril 10 MG capsule   Commonly known as: ALTACE   Take 1 capsule (10 mg total) by mouth daily.             Signed: Clydene Fake, MD 09/12/2011, 12:55 PM

## 2011-09-12 NOTE — Progress Notes (Signed)
Pt d/c home with [facility] services arranged by Case Manager.  Back incision dry/intact. No s/s of complications.  Discharge instructions reviewed and voiced understanding.  IV removed.  Tolerated well.  Guest services called to escort pt out via wheelchair.

## 2011-09-12 NOTE — Care Management Note (Signed)
    Page 1 of 1   09/13/2011     9:52:39 AM   CARE MANAGEMENT NOTE 09/13/2011  Patient:  NAOMI, FITTON   Account Number:  [id number]  Date Initiated:  09/12/2011  Documentation initiated by:  Onnie Boer  Subjective/Objective Assessment:   PT WAS ADMITTED FOR SURG     Action/Plan:   PROGRESSION OF CARE AND DISCHARGE PLANNING   Anticipated DC Date:  09/13/2011   Anticipated DC Plan:  HOME W HOME HEALTH SERVICES      DC Planning Services  CM consult      Choice offered to / List presented to:  C-1 Patient        HH arranged  HH-2 PT      HH agency  [facility]   Status of service:  Completed, signed off Medicare Important Message given?   (If response is "NO", the following Medicare IM given date fields will be blank) Date Medicare IM given:   Date Additional Medicare IM given:    Discharge Disposition:  HOME W HOME HEALTH SERVICES  Per UR Regulation:  Reviewed for med. necessity/level of care/duration of stay  If discussed at Long Length of Stay Meetings, dates discussed:    Comments:  09/12/11 Onnie Boer, RN, BSN 1229 PT WAS ADMITTED FOR A LAMINECTOMY.  PTA PT WAS AT HOME WITH SELF/ FAMILY CARE.  PHYSICAL THERAPY HAS RECOMMENDED HH PT. PATIENT HAS CHOSEN [facility], WILL ARRANGE.  PT MAY BE DC'D TO HOME TODAY, IF MEDICALLY STABLE.

## 2011-09-13 DIAGNOSIS — IMO0001 Reserved for inherently not codable concepts without codable children: Secondary | ICD-10-CM | POA: Diagnosis not present

## 2011-09-13 DIAGNOSIS — R269 Unspecified abnormalities of gait and mobility: Secondary | ICD-10-CM | POA: Diagnosis not present

## 2011-09-13 DIAGNOSIS — Z4789 Encounter for other orthopedic aftercare: Secondary | ICD-10-CM | POA: Diagnosis not present

## 2011-09-13 DIAGNOSIS — E1149 Type 2 diabetes mellitus with other diabetic neurological complication: Secondary | ICD-10-CM | POA: Diagnosis not present

## 2011-09-13 DIAGNOSIS — E1142 Type 2 diabetes mellitus with diabetic polyneuropathy: Secondary | ICD-10-CM | POA: Diagnosis not present

## 2011-09-13 DIAGNOSIS — M1710 Unilateral primary osteoarthritis, unspecified knee: Secondary | ICD-10-CM | POA: Diagnosis not present

## 2011-09-14 DIAGNOSIS — M1710 Unilateral primary osteoarthritis, unspecified knee: Secondary | ICD-10-CM | POA: Diagnosis not present

## 2011-09-14 DIAGNOSIS — R269 Unspecified abnormalities of gait and mobility: Secondary | ICD-10-CM | POA: Diagnosis not present

## 2011-09-14 DIAGNOSIS — E1149 Type 2 diabetes mellitus with other diabetic neurological complication: Secondary | ICD-10-CM | POA: Diagnosis not present

## 2011-09-14 DIAGNOSIS — Z4789 Encounter for other orthopedic aftercare: Secondary | ICD-10-CM | POA: Diagnosis not present

## 2011-09-14 DIAGNOSIS — E1142 Type 2 diabetes mellitus with diabetic polyneuropathy: Secondary | ICD-10-CM | POA: Diagnosis not present

## 2011-09-14 DIAGNOSIS — IMO0001 Reserved for inherently not codable concepts without codable children: Secondary | ICD-10-CM | POA: Diagnosis not present

## 2011-09-18 DIAGNOSIS — M1710 Unilateral primary osteoarthritis, unspecified knee: Secondary | ICD-10-CM | POA: Diagnosis not present

## 2011-09-18 DIAGNOSIS — Z4789 Encounter for other orthopedic aftercare: Secondary | ICD-10-CM | POA: Diagnosis not present

## 2011-09-18 DIAGNOSIS — IMO0001 Reserved for inherently not codable concepts without codable children: Secondary | ICD-10-CM | POA: Diagnosis not present

## 2011-09-18 DIAGNOSIS — E1149 Type 2 diabetes mellitus with other diabetic neurological complication: Secondary | ICD-10-CM | POA: Diagnosis not present

## 2011-09-18 DIAGNOSIS — E1142 Type 2 diabetes mellitus with diabetic polyneuropathy: Secondary | ICD-10-CM | POA: Diagnosis not present

## 2011-09-18 DIAGNOSIS — R269 Unspecified abnormalities of gait and mobility: Secondary | ICD-10-CM | POA: Diagnosis not present

## 2011-09-18 LAB — GLUCOSE, CAPILLARY: Glucose-Capillary: 110 mg/dL — ABNORMAL HIGH (ref 70–99)

## 2011-09-20 DIAGNOSIS — R269 Unspecified abnormalities of gait and mobility: Secondary | ICD-10-CM | POA: Diagnosis not present

## 2011-09-20 DIAGNOSIS — E1149 Type 2 diabetes mellitus with other diabetic neurological complication: Secondary | ICD-10-CM | POA: Diagnosis not present

## 2011-09-20 DIAGNOSIS — M1710 Unilateral primary osteoarthritis, unspecified knee: Secondary | ICD-10-CM | POA: Diagnosis not present

## 2011-09-20 DIAGNOSIS — IMO0001 Reserved for inherently not codable concepts without codable children: Secondary | ICD-10-CM | POA: Diagnosis not present

## 2011-09-20 DIAGNOSIS — E1142 Type 2 diabetes mellitus with diabetic polyneuropathy: Secondary | ICD-10-CM | POA: Diagnosis not present

## 2011-09-20 DIAGNOSIS — Z4789 Encounter for other orthopedic aftercare: Secondary | ICD-10-CM | POA: Diagnosis not present

## 2011-09-22 ENCOUNTER — Other Ambulatory Visit: Payer: Self-pay | Admitting: Internal Medicine

## 2011-09-22 DIAGNOSIS — E1142 Type 2 diabetes mellitus with diabetic polyneuropathy: Secondary | ICD-10-CM | POA: Diagnosis not present

## 2011-09-22 DIAGNOSIS — Z4789 Encounter for other orthopedic aftercare: Secondary | ICD-10-CM | POA: Diagnosis not present

## 2011-09-22 DIAGNOSIS — IMO0001 Reserved for inherently not codable concepts without codable children: Secondary | ICD-10-CM | POA: Diagnosis not present

## 2011-09-22 DIAGNOSIS — E1149 Type 2 diabetes mellitus with other diabetic neurological complication: Secondary | ICD-10-CM | POA: Diagnosis not present

## 2011-09-22 DIAGNOSIS — R269 Unspecified abnormalities of gait and mobility: Secondary | ICD-10-CM | POA: Diagnosis not present

## 2011-09-22 DIAGNOSIS — M1710 Unilateral primary osteoarthritis, unspecified knee: Secondary | ICD-10-CM | POA: Diagnosis not present

## 2011-09-26 DIAGNOSIS — E1142 Type 2 diabetes mellitus with diabetic polyneuropathy: Secondary | ICD-10-CM | POA: Diagnosis not present

## 2011-09-26 DIAGNOSIS — IMO0001 Reserved for inherently not codable concepts without codable children: Secondary | ICD-10-CM | POA: Diagnosis not present

## 2011-09-26 DIAGNOSIS — R269 Unspecified abnormalities of gait and mobility: Secondary | ICD-10-CM | POA: Diagnosis not present

## 2011-09-26 DIAGNOSIS — M1710 Unilateral primary osteoarthritis, unspecified knee: Secondary | ICD-10-CM | POA: Diagnosis not present

## 2011-09-26 DIAGNOSIS — Z4789 Encounter for other orthopedic aftercare: Secondary | ICD-10-CM | POA: Diagnosis not present

## 2011-09-26 DIAGNOSIS — E1149 Type 2 diabetes mellitus with other diabetic neurological complication: Secondary | ICD-10-CM | POA: Diagnosis not present

## 2011-09-27 DIAGNOSIS — M1710 Unilateral primary osteoarthritis, unspecified knee: Secondary | ICD-10-CM | POA: Diagnosis not present

## 2011-09-27 DIAGNOSIS — E1149 Type 2 diabetes mellitus with other diabetic neurological complication: Secondary | ICD-10-CM | POA: Diagnosis not present

## 2011-09-27 DIAGNOSIS — R269 Unspecified abnormalities of gait and mobility: Secondary | ICD-10-CM | POA: Diagnosis not present

## 2011-09-27 DIAGNOSIS — IMO0001 Reserved for inherently not codable concepts without codable children: Secondary | ICD-10-CM | POA: Diagnosis not present

## 2011-09-27 DIAGNOSIS — Z4789 Encounter for other orthopedic aftercare: Secondary | ICD-10-CM | POA: Diagnosis not present

## 2011-09-27 DIAGNOSIS — E1142 Type 2 diabetes mellitus with diabetic polyneuropathy: Secondary | ICD-10-CM | POA: Diagnosis not present

## 2011-09-28 DIAGNOSIS — E1149 Type 2 diabetes mellitus with other diabetic neurological complication: Secondary | ICD-10-CM | POA: Diagnosis not present

## 2011-09-28 DIAGNOSIS — E1142 Type 2 diabetes mellitus with diabetic polyneuropathy: Secondary | ICD-10-CM | POA: Diagnosis not present

## 2011-09-28 DIAGNOSIS — IMO0001 Reserved for inherently not codable concepts without codable children: Secondary | ICD-10-CM | POA: Diagnosis not present

## 2011-09-28 DIAGNOSIS — M1710 Unilateral primary osteoarthritis, unspecified knee: Secondary | ICD-10-CM | POA: Diagnosis not present

## 2011-09-28 DIAGNOSIS — R269 Unspecified abnormalities of gait and mobility: Secondary | ICD-10-CM | POA: Diagnosis not present

## 2011-09-28 DIAGNOSIS — Z4789 Encounter for other orthopedic aftercare: Secondary | ICD-10-CM | POA: Diagnosis not present

## 2011-10-02 ENCOUNTER — Ambulatory Visit (INDEPENDENT_AMBULATORY_CARE_PROVIDER_SITE_OTHER): Payer: Medicare Other | Admitting: Internal Medicine

## 2011-10-02 ENCOUNTER — Encounter: Payer: Self-pay | Admitting: Internal Medicine

## 2011-10-02 VITALS — BP 123/68 | HR 80 | Ht 69.0 in | Wt 166.0 lb

## 2011-10-02 DIAGNOSIS — I2510 Atherosclerotic heart disease of native coronary artery without angina pectoris: Secondary | ICD-10-CM | POA: Diagnosis not present

## 2011-10-02 NOTE — Patient Instructions (Signed)
Your physician recommends that you schedule a follow-up appointment in: April 2014. THE OFFICE WILL mail a reminder letter 2 months prior appointment date.  Your physician recommends that you continue on your current medications as directed. Please refer to the Current Medication list given to you today.

## 2011-10-02 NOTE — Progress Notes (Signed)
HPI  Patient is a 78 year old with a history of CAD (s/p CABG in 2004:  LIMA to LAD; SVG to ramus; SVG to PDA/PLSA); HTN, HL, DM.  I saw him in October. IN the interval he has had back surgery.  He is still recovering.  THe patient denies CP.  Breathing is OK.  No edema.  No Known Allergies  Current Outpatient Prescriptions  Medication Sig Dispense Refill  . acyclovir ointment (ZOVIRAX) 5 % Apply 1 application topically daily as needed. For outbreak      . ascorbic acid (VITAMIN C) 100 MG tablet Take 100 mg by mouth daily.      Marland Kitchen ascorbic acid (VITAMIN C) 1000 MG tablet Take 1,000 mg by mouth daily.      Marland Kitchen atorvastatin (LIPITOR) 40 MG tablet Take 40 mg by mouth daily.      . beta carotene w/minerals (OCUVITE) tablet Take 1 tablet by mouth daily.      . cephALEXin (KEFLEX) 500 MG capsule As directed      . CINNAMON PO Take 1 tablet by mouth every morning.      . diclofenac (VOLTAREN) 75 MG EC tablet Take 1 tablet (75 mg total) by mouth 2 (two) times daily.  180 tablet  0  . ferrous sulfate ([employer]S IRON) 325 (65 FE) MG tablet Take 325 mg by mouth 2 (two) times daily.      . fluticasone (VERAMYST) 27.5 MCG/SPRAY nasal spray Place 2 sprays into the nose at bedtime. Each nostril.      . FreeStyle Unistick II Lancets MISC Use once daily as instructed.       Marland Kitchen glucose blood (FREESTYLE LITE) test strip Use once daily as instructed  100 each  11  . glyBURIDE (DIABETA) 5 MG tablet Take 1 tablet (5 mg total) by mouth daily with breakfast.  90 tablet  3  . guaiFENesin (MUCINEX) 600 MG 12 hr tablet Take 600 mg by mouth daily.      Marland Kitchen ketoconazole (NIZORAL) 2 % shampoo Apply topically 2 (two) times a week.  120 mL  3  . metFORMIN (GLUCOPHAGE) 1000 MG tablet Take 1,000 mg by mouth 2 (two) times daily with a meal.      . metoprolol succinate (TOPROL-XL) 25 MG 24 hr tablet TAKE 1 TABLET (25MG  TOTAL) BY MOUTH DAILY  90 tablet  1  . Multiple Vitamin (MULTIVITAMIN) tablet Take 1 tablet by mouth daily.        Marland Kitchen  olopatadine (PATANOL) 0.1 % ophthalmic solution Place 1 drop into both eyes 2 (two) times daily.        Marland Kitchen omeprazole (PRILOSEC) 20 MG capsule Take 1 capsule (20 mg total) by mouth 2 (two) times daily.  180 capsule  1  . ramipril (ALTACE) 10 MG capsule TAKE 1 CAPSULE (10 MG TOTAL) BY MOUTH DAILY  90 capsule  1  . DISCONTD: testosterone cypionate (DEPO-TESTOSTERONE) 200 MG/ML injection Inject 1 mL (200 mg total) into the muscle every 30 (thirty) days.  10 mL  0   Current Facility-Administered Medications  Medication Dose Route Frequency Provider Last Rate Last Dose  . testosterone cypionate (DEPOTESTOTERONE CYPIONATE) injection 200 mg  200 mg Intramuscular Q28 days Lindley Magnus, MD   200 mg at 09/08/10 1338    Past Medical History  Diagnosis Date  . Diabetes mellitus     type II  . GERD (gastroesophageal reflux disease)   . Hyperlipidemia   . Anemia   . CAD (  coronary artery disease) 2004    s/p inferior wall Mi 2004 with PTCA/Stent; s/p CABG 2004  . History of myocardial infarction   . Myocardial infarction   . Pneumonia     hx of    Past Surgical History  Procedure Date  . Coronary artery bypass graft 2004    LIMA to LAD; SVG to ramus intermedius; SVG to PDA/PLSA  . Tonsillectomy   . Cardiac catheterization   . Colonoscopy   . Laminectomy 09/08/2011    Procedure: LUMBAR LAMINECTOMY FOR TUMOR;  Surgeon: Maeola Harman, MD;  Location: [facility];  Service: Neurosurgery;  Laterality: Left;  Left Thoracic twelve-Lumbar one transpedicular resection of epidural mass    Family History  Problem Relation Age of Onset  . Cirrhosis Mother     etiology unclear  . Hypertension Father   . Stroke Father   . Cancer Sister     breast  . Stroke Maternal Grandmother   . Heart disease Maternal Grandfather     MI  . Stroke Paternal Grandmother   . Stroke Paternal Grandfather   . Anesthesia problems Neg Hx   . Hypotension Neg Hx   . Malignant hyperthermia Neg Hx   . Pseudochol  deficiency Neg Hx     History   Social History  . Marital Status: Married    Spouse Name: N/A    Number of Children: N/A  . Years of Education: N/A   Occupational History  . Not on file.   Social History Main Topics  . Smoking status: Former Smoker    Types: Pipe  . Smokeless tobacco: Not on file  . Alcohol Use: Yes  . Drug Use: No  . Sexually Active: Not on file   Other Topics Concern  . Not on file   Social History Narrative   Regular exercise: noDaily Caffeine: 3 cups coffee daily    Review of Systems:  All systems reviewed.  They are negative to the above problem except as previously stated.  Vital Signs: BP 123/68  Pulse 80  Ht 5\' 9"  (1.753 m)  Wt 166 lb (75.297 kg)  BMI 24.51 kg/m2  Physical Exam Patient is in NAD HEENT:  Normocephalic, atraumatic. EOMI, PERRLA.  Neck: JVP is normal. No thyromegaly. No bruits.  Lungs: clear to auscultation. No rales no wheezes.  Heart: Regular rate and rhythm. Normal S1, S2. No S3.   No significant murmurs. PMI not displaced.  Abdomen:  Supple, nontender. Normal bowel sounds. No masses. No hepatomegaly.  Extremities:   Good distal pulses throughout. No lower extremity edema.  Musculoskeletal :moving all extremities.  Neuro:   alert and oriented x3.  CN II-XII grossly intact.  EKG:  SR  80 bpm.  Nonspeciific ST T wave changes.  IMpression  1.  CAD  No symptoms of angina.   2.  HTN  Adequate control 3.  HL.  Keep on lipitor  Encouraged him to stay/increase activity.  Assessment and Plan:

## 2011-10-03 DIAGNOSIS — Z4789 Encounter for other orthopedic aftercare: Secondary | ICD-10-CM | POA: Diagnosis not present

## 2011-10-03 DIAGNOSIS — M1710 Unilateral primary osteoarthritis, unspecified knee: Secondary | ICD-10-CM | POA: Diagnosis not present

## 2011-10-03 DIAGNOSIS — E1142 Type 2 diabetes mellitus with diabetic polyneuropathy: Secondary | ICD-10-CM | POA: Diagnosis not present

## 2011-10-03 DIAGNOSIS — E1149 Type 2 diabetes mellitus with other diabetic neurological complication: Secondary | ICD-10-CM | POA: Diagnosis not present

## 2011-10-03 DIAGNOSIS — R269 Unspecified abnormalities of gait and mobility: Secondary | ICD-10-CM | POA: Diagnosis not present

## 2011-10-03 DIAGNOSIS — IMO0001 Reserved for inherently not codable concepts without codable children: Secondary | ICD-10-CM | POA: Diagnosis not present

## 2011-10-05 DIAGNOSIS — R269 Unspecified abnormalities of gait and mobility: Secondary | ICD-10-CM | POA: Diagnosis not present

## 2011-10-05 DIAGNOSIS — E1149 Type 2 diabetes mellitus with other diabetic neurological complication: Secondary | ICD-10-CM | POA: Diagnosis not present

## 2011-10-05 DIAGNOSIS — M1710 Unilateral primary osteoarthritis, unspecified knee: Secondary | ICD-10-CM | POA: Diagnosis not present

## 2011-10-05 DIAGNOSIS — IMO0001 Reserved for inherently not codable concepts without codable children: Secondary | ICD-10-CM | POA: Diagnosis not present

## 2011-10-05 DIAGNOSIS — Z4789 Encounter for other orthopedic aftercare: Secondary | ICD-10-CM | POA: Diagnosis not present

## 2011-10-05 DIAGNOSIS — E1142 Type 2 diabetes mellitus with diabetic polyneuropathy: Secondary | ICD-10-CM | POA: Diagnosis not present

## 2011-10-07 DIAGNOSIS — IMO0001 Reserved for inherently not codable concepts without codable children: Secondary | ICD-10-CM | POA: Diagnosis not present

## 2011-10-07 DIAGNOSIS — R269 Unspecified abnormalities of gait and mobility: Secondary | ICD-10-CM | POA: Diagnosis not present

## 2011-10-07 DIAGNOSIS — E1149 Type 2 diabetes mellitus with other diabetic neurological complication: Secondary | ICD-10-CM | POA: Diagnosis not present

## 2011-10-07 DIAGNOSIS — E1142 Type 2 diabetes mellitus with diabetic polyneuropathy: Secondary | ICD-10-CM | POA: Diagnosis not present

## 2011-10-07 DIAGNOSIS — M1710 Unilateral primary osteoarthritis, unspecified knee: Secondary | ICD-10-CM | POA: Diagnosis not present

## 2011-10-07 DIAGNOSIS — Z4789 Encounter for other orthopedic aftercare: Secondary | ICD-10-CM | POA: Diagnosis not present

## 2011-10-10 DIAGNOSIS — M1710 Unilateral primary osteoarthritis, unspecified knee: Secondary | ICD-10-CM | POA: Diagnosis not present

## 2011-11-04 ENCOUNTER — Other Ambulatory Visit: Payer: Self-pay | Admitting: Internal Medicine

## 2011-11-10 ENCOUNTER — Other Ambulatory Visit: Payer: Self-pay | Admitting: *Deleted

## 2011-11-10 NOTE — Telephone Encounter (Signed)
Opened in error

## 2011-11-13 ENCOUNTER — Other Ambulatory Visit: Payer: Self-pay | Admitting: *Deleted

## 2011-11-13 MED ORDER — FLUOCINONIDE 0.05 % EX CREA: TOPICAL_CREAM | Freq: Two times a day (BID) | CUTANEOUS | Status: DC

## 2011-11-16 DIAGNOSIS — M1710 Unilateral primary osteoarthritis, unspecified knee: Secondary | ICD-10-CM | POA: Diagnosis not present

## 2011-11-22 ENCOUNTER — Telehealth: Payer: Self-pay | Admitting: *Deleted

## 2011-11-22 NOTE — Telephone Encounter (Signed)
Opened in error

## 2011-11-27 ENCOUNTER — Other Ambulatory Visit: Payer: Self-pay | Admitting: Orthopedic Surgery

## 2011-11-27 MED ORDER — BUPIVACAINE 0.25 % ON-Q PUMP SINGLE CATH 300ML: 300.0000 mL | INJECTION | Status: DC

## 2011-11-27 MED ORDER — DEXAMETHASONE SODIUM PHOSPHATE 10 MG/ML IJ SOLN: 10.0000 mg | Freq: Once | INTRAMUSCULAR | Status: DC

## 2011-11-27 NOTE — Progress Notes (Signed)
Preoperative surgical orders have been place into the Epic hospital system for Steven [name] on 11/27/2011, 8:09 AM  by Patrica Duel for surgery on 01/15/2012.  Preop Total Knee orders including Bupivacaine On-Q pump, IV Tylenol, and IV Decadron as long as there are no contraindications to the above medications. Avel Peace, PA-C

## 2011-12-01 ENCOUNTER — Other Ambulatory Visit: Payer: Self-pay | Admitting: Internal Medicine

## 2011-12-11 ENCOUNTER — Telehealth: Payer: Self-pay | Admitting: Internal Medicine

## 2011-12-11 NOTE — Telephone Encounter (Signed)
Pt having total knee replacement 01-15-12, and needs surgical clearance , dr Despina Hick [phone] att drew, pls call pt when done [phone]

## 2011-12-13 ENCOUNTER — Encounter: Payer: Self-pay | Admitting: Internal Medicine

## 2011-12-14 NOTE — Telephone Encounter (Signed)
Note sent by Dr.Ross. Patient's wife aware.

## 2011-12-21 DIAGNOSIS — M1710 Unilateral primary osteoarthritis, unspecified knee: Secondary | ICD-10-CM | POA: Diagnosis not present

## 2011-12-25 DIAGNOSIS — E119 Type 2 diabetes mellitus without complications: Secondary | ICD-10-CM | POA: Diagnosis not present

## 2011-12-25 DIAGNOSIS — H01009 Unspecified blepharitis unspecified eye, unspecified eyelid: Secondary | ICD-10-CM | POA: Diagnosis not present

## 2012-01-02 ENCOUNTER — Encounter (HOSPITAL_COMMUNITY): Payer: Self-pay | Admitting: Pharmacy Technician

## 2012-01-03 ENCOUNTER — Encounter (HOSPITAL_COMMUNITY)
Admission: RE | Admit: 2012-01-03 | Discharge: 2012-01-03 | Disposition: A | Discharge status: Home / Self Care | Payer: Medicare Other | Source: Ambulatory Visit | Attending: Orthopedic Surgery | Admitting: Orthopedic Surgery

## 2012-01-03 ENCOUNTER — Encounter (HOSPITAL_COMMUNITY): Payer: Self-pay

## 2012-01-03 DIAGNOSIS — G629 Polyneuropathy, unspecified: Secondary | ICD-10-CM

## 2012-01-03 DIAGNOSIS — I219 Acute myocardial infarction, unspecified: Secondary | ICD-10-CM

## 2012-01-03 DIAGNOSIS — M1990 Unspecified osteoarthritis, unspecified site: Secondary | ICD-10-CM

## 2012-01-03 HISTORY — DX: Unspecified osteoarthritis, unspecified site: M19.90

## 2012-01-03 HISTORY — DX: Polyneuropathy, unspecified: G62.9

## 2012-01-03 HISTORY — DX: Acute myocardial infarction, unspecified: I21.9

## 2012-01-03 HISTORY — PX: CARDIAC CATHETERIZATION: SHX172

## 2012-01-03 LAB — PROTIME-INR
INR: 1.01 (ref 0.00–1.49)
Prothrombin Time: 13.2 seconds (ref 11.6–15.2)

## 2012-01-03 LAB — URINALYSIS, ROUTINE W REFLEX MICROSCOPIC
Bilirubin Urine: NEGATIVE
Ketones, ur: NEGATIVE mg/dL
Nitrite: NEGATIVE
Urobilinogen, UA: 0.2 mg/dL (ref 0.0–1.0)
pH: 5.5 (ref 5.0–8.0)

## 2012-01-03 LAB — COMPREHENSIVE METABOLIC PANEL
BUN: 24 mg/dL — ABNORMAL HIGH (ref 6–23)
CO2: 26 mEq/L (ref 19–32)
Chloride: 101 mEq/L (ref 96–112)
Creatinine, Ser: 0.84 mg/dL (ref 0.50–1.35)
GFR calc non Af Amer: 82 mL/min — ABNORMAL LOW (ref >90)
Total Bilirubin: 0.3 mg/dL (ref 0.3–1.2)

## 2012-01-03 LAB — CBC
HCT: 33.8 % — ABNORMAL LOW (ref 39.0–52.0)
MCV: 90.9 fL (ref 78.0–100.0)
RBC: 3.72 MIL/uL — ABNORMAL LOW (ref 4.22–5.81)
WBC: 6.4 10*3/uL (ref 4.0–10.5)

## 2012-01-03 NOTE — Patient Instructions (Addendum)
Steven [name]  01/03/2012   Your procedure is scheduled on:   01-15-2012  Report to [facility] at       2:00 PM.  Call this number if you have problems the morning of surgery: [phone]  Or Presurgical Testing [phone](Kristeen Lantz)   Remember:   Do not eat food:After Midnight.  May have clear liquids:up to 6 Hours before arrival. Nothing after : 1000 AM  Clear liquids include soda, tea, black coffee, apple or grape juice, broth.   Take these medicines the morning of surgery with A SIP OF WATER:  None. Take usual Toprol PM of 9-29. Do not take any Diabetic meds AM of. Bring Veramyst nasal spray and Patanol (lubricant) eye drops. Stop all OTC supplements: Cinnamon, vitamins, Fish oil, no Aspirin, herbal products.   Do not wear jewelry, make-up or nail polish.  Do not wear lotions, powders, or perfumes. You may wear deodorant.  Do not shave 48 hours prior to surgery.(face and neck okay, no shaving of legs)  Do not bring valuables to the hospital.  Contacts, dentures or bridgework may not be worn into surgery.  Leave suitcase in the car. After surgery it may be brought to your room.  For patients admitted to the hospital, checkout time is 11:00 AM the day of discharge.   Patients discharged the day of surgery will not be allowed to drive home. Must have responsible person with you x 24 hours once discharged.  Name and phone number of your driver: maurico, perrell [phone] cell  Special Instructions: CHG Shower Use Special Wash: 1/2 bottle night before surgery and 1/2 bottle morning of surgery.(avoid face and genitals)-see special instruction sheet.   Please read over the following fact sheets that you were given: MRSA Information, Blood Transfusion fact sheet, Incentive Spirometry Instruction.

## 2012-01-03 NOTE — Pre-Procedure Instructions (Addendum)
01-03-12 EKG-6'13/ CXR 1 view 5'13 -Epic. 01-12-12 0840 Pt. Made aware of surgery time changed to 1515 and to arrive at 1245 PM , stop Clear Liquids 12 MN to 0900 AM. W. Heavenly Christine,RN

## 2012-01-04 ENCOUNTER — Other Ambulatory Visit: Payer: Self-pay | Admitting: Orthopedic Surgery

## 2012-01-04 NOTE — H&P (Signed)
Steven [name]  DOB: [birth year] Married / Language: English / Race: White Male  Date of Admission:  01/15/2012  Chief Complaint:  Right Knee Pain  History of Present Illness The patient is a 78 year old male who comes in for a preoperative History and Physical. The patient is scheduled for a right total knee arthroplasty to be performed by Dr. Gus Rankin. Aluisio, MD at [facility] on 01/15/2012 . Please see the hospital record for complete dictated history and physical. The patient is a 78 year old male who presents today for follow up of their knee. The patient is being followed for their bilateral knee pain and osteoarthritis. The patient presents today following right knee cortisone injection with Brett Canales, [facility] on 10/10/11. Note for "Follow-up Knee": He said he needs injections today, but not sure which ones. He said he just had his back surgery, so he wants to get on the schedule for a total knee replacement on the right. He said both his knees start hurting while he is sleeping in the early mornings. He is not sure if it is because they get cold.  He feels like he's ready to go ahead and get the right knee replaced now. He is ready to proceed with knee surgery. They have been treated conservatively in the past for the above stated problem and despite conservative measures, they continue to have progressive pain and severe functional limitations and dysfunction. They have failed non-operative management. It is felt that they would benefit from undergoing total joint replacement. Risks and benefits of the procedure have been discussed with the patient and they elect to proceed with surgery. There are no active contraindications to surgery such as ongoing infection or rapidly progressive neurological disease.   Problem List/Past Medical Osteoarthritis, Knee (715.96) Scoliosis of lumbar spine (737.30) Lumbar disc degeneration (722.52) Pain, Hip (719.45)  Allergies No Known  Allergies   Family History Heart Disease. grandmother mothers side, grandfather mothers side and grandfather fathers side Cancer. sister Drug / Alcohol Addiction. father Liver Disease, Chronic. mother Hypertension. father Diabetes Mellitus. father and grandfather mothers side Cerebrovascular Accident. father Depression. father   Social History Marital status. married Living situation. live with spouse Illicit drug use. no Number of flights of stairs before winded. 1 Tobacco use. Former smoker. never smoker Tobacco / smoke exposure. no Pain Contract. no Current work status. working full time Children. 2 Exercise. Exercises rarely Drug/Alcohol Rehab (Previously). no Alcohol use. current drinker; drinks beer; only occasionally per week Drug/Alcohol Rehab (Currently). no No Known Drug Allergies. 11/23/2010 Post-Surgical Plans. Plan is to look into [facility]. Advance Directives. Living Will   Medication History GlyBURIDE (2.5MG  Tablet, Oral) Active. Atorvastatin Calcium (40MG  Tablet, Oral) Active. Diclofenac Sodium (75MG  Tablet DR, Oral) Active. GlyBURIDE (5MG  Tablet, Oral) Active. MetFORMIN HCl (1000MG  Tablet, Oral) Active. Omeprazole (20MG  Capsule DR, Oral) Active. Toprol XL (25MG  Tablet ER 24HR, Oral) Active. Ramipril (10MG  Capsule, Oral) Active. Pataday (0.2% Solution, Ophthalmic) Active. Veramyst (27.5MCG/SPRAY Suspension, Nasal) Active. Lipitor ( Oral) Specific dose unknown - Active. Ramipril ( Oral) Specific dose unknown - Active. Famotidine (20MG  Tablet, Oral) Active. Fluocinonide (0.05% Cream, External) Active. Fish Oil Burp-Less ( Oral) Specific dose unknown - Active. Patanol (0.1% Solution, Ophthalmic) Active. Aspirin Childrens (81MG  Tablet Chewable, Oral) Active.   Past Surgical History Heart Stents. One Tonsillectomy Vasectomy Sinus Surgery Coronary Artery Bypass Graft. 3 vessels Back Surgery. Date:  09/08/2011.   Medical History Hypercholesterolemia Anemia Kidney Stone Myocardial infarction. July 2004 ? mass  l gluteus Diabetes Mellitus, Type II Chronic Pain Coronary Artery Disease/Heart Disease   Review of Systems General:Not Present- Chills, Fever, Night Sweats, Fatigue, Weight Gain, Weight Loss and Memory Loss. Skin:Not Present- Hives, Itching, Rash, Eczema and Lesions. HEENT:Not Present- Tinnitus, Headache, Double Vision, Visual Loss, Hearing Loss and Dentures. Respiratory:Not Present- Shortness of breath with exertion, Shortness of breath at rest, Allergies, Coughing up blood and Chronic Cough. Cardiovascular:Not Present- Chest Pain, Racing/skipping heartbeats, Difficulty Breathing Lying Down, Murmur, Swelling and Palpitations. Gastrointestinal:Not Present- Bloody Stool, Heartburn, Abdominal Pain, Vomiting, Nausea, Constipation, Diarrhea, Difficulty Swallowing, Jaundice and Loss of appetitie. Male Genitourinary:Not Present- Urinary frequency, Blood in Urine, Weak urinary stream, Discharge, Flank Pain, Incontinence, Painful Urination, Urgency, Urinary Retention and Urinating at Night. Musculoskeletal:Present- Morning Stiffness. Not Present- Muscle Weakness, Muscle Pain, Joint Swelling, Joint Pain, Back Pain and Spasms. Neurological:Not Present- Tremor, Dizziness, Blackout spells, Paralysis, Difficulty with balance and Weakness. Psychiatric:Not Present- Insomnia.   Vitals Weight: 160 lb Height: 69 in Weight was reported by patient. Height was reported by patient. Body Surface Area: 1.88 m Body Mass Index: 23.63 kg/m Pulse: 68 (Regular) Resp.: 14 (Unlabored) BP: 138/70 (Sitting, Left Arm, Standard)    Physical Exam The physical exam findings are as follows:   General Mental Status - Alert, cooperative and good historian. General Appearance- pleasant. Not in acute distress. Orientation- Oriented X3. Build & Nutrition- Well nourished  and Well developed.   Head and Neck Head- normocephalic, atraumatic . Neck Global Assessment- supple. no bruit auscultated on the right and no bruit auscultated on the left.   Eye Pupil- Bilateral- Regular and Round. Motion- Bilateral- EOMI.   Chest and Lung Exam Auscultation: Breath sounds:- clear at anterior chest wall and - clear at posterior chest wall. Adventitious sounds:- No Adventitious sounds.   Cardiovascular Auscultation:Rhythm- Regular rate and rhythm. Heart Sounds- S1 WNL and S2 WNL. Murmurs & Other Heart Sounds: Murmur 1:Location- Sternal Border - Left. Timing- Early systolic. Grade- II/VI. Character- Low pitched.   Abdomen Palpation/Percussion:Tenderness- Abdomen is non-tender to palpation. Rigidity (guarding)- Abdomen is soft. Auscultation:Auscultation of the abdomen reveals - Bowel sounds normal.   Male Genitourinary Not done, not pertinent to present illness  Musculoskeletal On exam, well-developed male, in no apparent distress. The right knee shows no effusion. Marked crepitus on ROM of the right knee. Range is 5-120. There is no instability. Left knee has a trace effusion. Range is 5-120. Varus deformity. Tender medial greater than lateral. No instability.  RADIOGRAPHS: He has advanced end stage arthritis of both, slightly worse radiographically on the left. Symptomatically, however, he is worse on the right.  Assessment & Plan Osteoarthrosis NOS, lower leg (715.96) Impression: Right Knee  Note: Patient is for a right total knee replacement by Dr. Lequita Halt.  Plan is to go SNF after the hospital stay. He wants to look into [facility] / [employer].  PCP - Dr. Birdie Sons - Patient was seen preoperatively and felt to be stable for surgery. Cardiology - Dr. Dietrich Pates - Patient was seen preoperatively and felt to be stable for surgery.  Signed electronically by Roberts Gaudy, PA-C

## 2012-01-09 ENCOUNTER — Other Ambulatory Visit (HOSPITAL_COMMUNITY): Payer: Medicare Other

## 2012-01-11 ENCOUNTER — Other Ambulatory Visit: Payer: Self-pay | Admitting: Internal Medicine

## 2012-01-14 ENCOUNTER — Other Ambulatory Visit: Payer: Self-pay | Admitting: Orthopedic Surgery

## 2012-01-15 ENCOUNTER — Encounter (HOSPITAL_COMMUNITY): Payer: Self-pay | Admitting: *Deleted

## 2012-01-15 ENCOUNTER — Encounter (HOSPITAL_COMMUNITY): Payer: Self-pay | Admitting: Anesthesiology

## 2012-01-15 ENCOUNTER — Inpatient Hospital Stay (HOSPITAL_COMMUNITY)
Admission: RE | Admit: 2012-01-15 | Discharge: 2012-01-19 | DRG: 470 | Disposition: A | Discharge status: Skilled Nursing Facility | Payer: Medicare Other | Source: Ambulatory Visit | Attending: Orthopedic Surgery | Admitting: Orthopedic Surgery

## 2012-01-15 ENCOUNTER — Encounter (HOSPITAL_COMMUNITY)
Admission: RE | Disposition: A | Discharge status: Skilled Nursing Facility | Payer: Self-pay | Source: Ambulatory Visit | Attending: Orthopedic Surgery

## 2012-01-15 ENCOUNTER — Inpatient Hospital Stay (HOSPITAL_COMMUNITY): Payer: Medicare Other | Admitting: Anesthesiology

## 2012-01-15 DIAGNOSIS — Z9861 Coronary angioplasty status: Secondary | ICD-10-CM | POA: Diagnosis not present

## 2012-01-15 DIAGNOSIS — D62 Acute posthemorrhagic anemia: Secondary | ICD-10-CM | POA: Diagnosis not present

## 2012-01-15 DIAGNOSIS — M1710 Unilateral primary osteoarthritis, unspecified knee: Secondary | ICD-10-CM | POA: Diagnosis not present

## 2012-01-15 DIAGNOSIS — Z951 Presence of aortocoronary bypass graft: Secondary | ICD-10-CM | POA: Diagnosis not present

## 2012-01-15 DIAGNOSIS — Z7982 Long term (current) use of aspirin: Secondary | ICD-10-CM | POA: Diagnosis not present

## 2012-01-15 DIAGNOSIS — Z9089 Acquired absence of other organs: Secondary | ICD-10-CM | POA: Diagnosis not present

## 2012-01-15 DIAGNOSIS — I252 Old myocardial infarction: Secondary | ICD-10-CM

## 2012-01-15 DIAGNOSIS — Z5189 Encounter for other specified aftercare: Secondary | ICD-10-CM | POA: Diagnosis not present

## 2012-01-15 DIAGNOSIS — I2510 Atherosclerotic heart disease of native coronary artery without angina pectoris: Secondary | ICD-10-CM | POA: Diagnosis not present

## 2012-01-15 DIAGNOSIS — M4120 Other idiopathic scoliosis, site unspecified: Secondary | ICD-10-CM | POA: Diagnosis not present

## 2012-01-15 DIAGNOSIS — E785 Hyperlipidemia, unspecified: Secondary | ICD-10-CM | POA: Diagnosis present

## 2012-01-15 DIAGNOSIS — E119 Type 2 diabetes mellitus without complications: Secondary | ICD-10-CM | POA: Diagnosis not present

## 2012-01-15 DIAGNOSIS — S40019A Contusion of unspecified shoulder, initial encounter: Secondary | ICD-10-CM | POA: Diagnosis not present

## 2012-01-15 DIAGNOSIS — I498 Other specified cardiac arrhythmias: Secondary | ICD-10-CM | POA: Diagnosis present

## 2012-01-15 DIAGNOSIS — Z96659 Presence of unspecified artificial knee joint: Secondary | ICD-10-CM | POA: Diagnosis not present

## 2012-01-15 DIAGNOSIS — M25569 Pain in unspecified knee: Secondary | ICD-10-CM | POA: Diagnosis not present

## 2012-01-15 DIAGNOSIS — M5137 Other intervertebral disc degeneration, lumbosacral region: Secondary | ICD-10-CM | POA: Diagnosis not present

## 2012-01-15 DIAGNOSIS — W19XXXA Unspecified fall, initial encounter: Secondary | ICD-10-CM

## 2012-01-15 DIAGNOSIS — M25559 Pain in unspecified hip: Secondary | ICD-10-CM | POA: Diagnosis not present

## 2012-01-15 DIAGNOSIS — S8990XA Unspecified injury of unspecified lower leg, initial encounter: Secondary | ICD-10-CM | POA: Diagnosis not present

## 2012-01-15 DIAGNOSIS — G5790 Unspecified mononeuropathy of unspecified lower limb: Secondary | ICD-10-CM | POA: Diagnosis present

## 2012-01-15 DIAGNOSIS — W06XXXA Fall from bed, initial encounter: Secondary | ICD-10-CM | POA: Diagnosis not present

## 2012-01-15 DIAGNOSIS — G8929 Other chronic pain: Secondary | ICD-10-CM | POA: Diagnosis present

## 2012-01-15 DIAGNOSIS — IMO0002 Reserved for concepts with insufficient information to code with codable children: Secondary | ICD-10-CM | POA: Diagnosis not present

## 2012-01-15 DIAGNOSIS — Z79899 Other long term (current) drug therapy: Secondary | ICD-10-CM

## 2012-01-15 DIAGNOSIS — Z9289 Personal history of other medical treatment: Secondary | ICD-10-CM

## 2012-01-15 DIAGNOSIS — S99919A Unspecified injury of unspecified ankle, initial encounter: Secondary | ICD-10-CM | POA: Diagnosis not present

## 2012-01-15 DIAGNOSIS — G589 Mononeuropathy, unspecified: Secondary | ICD-10-CM | POA: Diagnosis not present

## 2012-01-15 DIAGNOSIS — M51379 Other intervertebral disc degeneration, lumbosacral region without mention of lumbar back pain or lower extremity pain: Secondary | ICD-10-CM | POA: Diagnosis present

## 2012-01-15 DIAGNOSIS — M109 Gout, unspecified: Secondary | ICD-10-CM | POA: Diagnosis present

## 2012-01-15 DIAGNOSIS — K219 Gastro-esophageal reflux disease without esophagitis: Secondary | ICD-10-CM | POA: Diagnosis present

## 2012-01-15 DIAGNOSIS — S5000XA Contusion of unspecified elbow, initial encounter: Secondary | ICD-10-CM | POA: Diagnosis not present

## 2012-01-15 DIAGNOSIS — M79609 Pain in unspecified limb: Secondary | ICD-10-CM | POA: Diagnosis not present

## 2012-01-15 HISTORY — PX: TOTAL KNEE ARTHROPLASTY: SHX125

## 2012-01-15 LAB — GLUCOSE, CAPILLARY
Glucose-Capillary: 149 mg/dL — ABNORMAL HIGH (ref 70–99)
Glucose-Capillary: 140 mg/dL — ABNORMAL HIGH (ref 70–99)
Glucose-Capillary: 193 mg/dL — ABNORMAL HIGH (ref 70–99)
Glucose-Capillary: 239 mg/dL — ABNORMAL HIGH (ref 70–99)

## 2012-01-15 LAB — ABO/RH: ABO/RH(D): A POS

## 2012-01-15 SURGERY — ARTHROPLASTY, KNEE, TOTAL
Anesthesia: General | Site: Knee | Laterality: Right | Wound class: Clean

## 2012-01-15 MED ORDER — SODIUM CHLORIDE 0.9 % IV SOLN: INTRAVENOUS | Status: DC

## 2012-01-15 MED ORDER — ONDANSETRON HCL 4 MG PO TABS: 4.0000 mg | ORAL_TABLET | Freq: Four times a day (QID) | ORAL | Status: DC | PRN

## 2012-01-15 MED ORDER — CHLORHEXIDINE GLUCONATE 4 % EX LIQD: 60.0000 mL | Freq: Once | CUTANEOUS | Status: DC

## 2012-01-15 MED ORDER — METHOCARBAMOL 500 MG PO TABS
500.0000 mg | ORAL_TABLET | Freq: Four times a day (QID) | ORAL | Status: DC | PRN
  Administered 2012-01-16 – 2012-01-18 (×5): 500 mg via ORAL
  Filled 2012-01-15 (×5): qty 1

## 2012-01-15 MED ORDER — LACTATED RINGERS IV SOLN: INTRAVENOUS | Status: DC

## 2012-01-15 MED ORDER — SODIUM CHLORIDE 0.9 % IR SOLN
Status: DC | PRN
  Administered 2012-01-15: 1000 mL

## 2012-01-15 MED ORDER — SUCCINYLCHOLINE CHLORIDE 20 MG/ML IJ SOLN
INTRAMUSCULAR | Status: DC | PRN
  Administered 2012-01-15: 80 mg via INTRAVENOUS

## 2012-01-15 MED ORDER — ONDANSETRON HCL 4 MG/2ML IJ SOLN
INTRAMUSCULAR | Status: DC | PRN
  Administered 2012-01-15: 4 mg via INTRAVENOUS

## 2012-01-15 MED ORDER — FLEET ENEMA 7-19 GM/118ML RE ENEM: 1.0000 | ENEMA | Freq: Once | RECTAL | Status: AC | PRN

## 2012-01-15 MED ORDER — METOPROLOL SUCCINATE ER 25 MG PO TB24
25.0000 mg | ORAL_TABLET | Freq: Every day | ORAL | Status: DC
  Administered 2012-01-15 – 2012-01-18 (×4): 25 mg via ORAL
  Filled 2012-01-15 (×5): qty 1

## 2012-01-15 MED ORDER — OLOPATADINE HCL 0.1 % OP SOLN
1.0000 [drp] | Freq: Two times a day (BID) | OPHTHALMIC | Status: DC
  Administered 2012-01-15 – 2012-01-19 (×8): 1 [drp] via OPHTHALMIC
  Filled 2012-01-15: qty 5

## 2012-01-15 MED ORDER — ACETAMINOPHEN 10 MG/ML IV SOLN: 1000.0000 mg | Freq: Once | INTRAVENOUS | Status: DC

## 2012-01-15 MED ORDER — EPHEDRINE SULFATE 50 MG/ML IJ SOLN
INTRAMUSCULAR | Status: DC | PRN
  Administered 2012-01-15: 5 mg via INTRAVENOUS

## 2012-01-15 MED ORDER — ACETAMINOPHEN 10 MG/ML IV SOLN
INTRAVENOUS | Status: DC | PRN
  Administered 2012-01-15: 1000 mg via INTRAVENOUS

## 2012-01-15 MED ORDER — SUFENTANIL CITRATE 50 MCG/ML IV SOLN
INTRAVENOUS | Status: DC | PRN
  Administered 2012-01-15: 10 ug via INTRAVENOUS
  Administered 2012-01-15: 20 ug via INTRAVENOUS
  Administered 2012-01-15 (×2): 10 ug via INTRAVENOUS

## 2012-01-15 MED ORDER — FLUTICASONE PROPIONATE 50 MCG/ACT NA SUSP
2.0000 | Freq: Every day | NASAL | Status: DC
  Administered 2012-01-15 – 2012-01-18 (×4): 2 via NASAL
  Filled 2012-01-15: qty 16

## 2012-01-15 MED ORDER — BISACODYL 10 MG RE SUPP: 10.0000 mg | Freq: Every day | RECTAL | Status: DC | PRN

## 2012-01-15 MED ORDER — DOCUSATE SODIUM 100 MG PO CAPS
100.0000 mg | ORAL_CAPSULE | Freq: Two times a day (BID) | ORAL | Status: DC
  Administered 2012-01-15 – 2012-01-19 (×8): 100 mg via ORAL

## 2012-01-15 MED ORDER — BUPIVACAINE 0.25 % ON-Q PUMP SINGLE CATH 300ML
INJECTION | Status: DC | PRN
  Administered 2012-01-15: 300 mL

## 2012-01-15 MED ORDER — DEXTROSE 5 % IV SOLN
3.0000 g | INTRAVENOUS | Status: AC
  Administered 2012-01-15: 2 g via INTRAVENOUS
  Filled 2012-01-15: qty 3000

## 2012-01-15 MED ORDER — POLYETHYLENE GLYCOL 3350 17 G PO PACK: 17.0000 g | PACK | Freq: Every day | ORAL | Status: DC | PRN

## 2012-01-15 MED ORDER — BUPIVACAINE ON-Q PAIN PUMP (FOR ORDER SET NO CHG)
INJECTION | Status: DC
  Filled 2012-01-15: qty 1

## 2012-01-15 MED ORDER — METHOCARBAMOL 100 MG/ML IJ SOLN
500.0000 mg | Freq: Four times a day (QID) | INTRAVENOUS | Status: DC | PRN
  Administered 2012-01-15: 500 mg via INTRAVENOUS
  Filled 2012-01-15: qty 5

## 2012-01-15 MED ORDER — GLYBURIDE 5 MG PO TABS
5.0000 mg | ORAL_TABLET | Freq: Every day | ORAL | Status: DC
  Administered 2012-01-16 – 2012-01-19 (×4): 5 mg via ORAL
  Filled 2012-01-15 (×6): qty 1

## 2012-01-15 MED ORDER — OXYCODONE HCL 5 MG PO TABS
5.0000 mg | ORAL_TABLET | ORAL | Status: DC | PRN
  Administered 2012-01-16 (×6): 10 mg via ORAL
  Filled 2012-01-15 (×6): qty 2

## 2012-01-15 MED ORDER — MORPHINE SULFATE 2 MG/ML IJ SOLN: 1.0000 mg | INTRAMUSCULAR | Status: DC | PRN

## 2012-01-15 MED ORDER — POTASSIUM CHLORIDE IN NACL 20-0.9 MEQ/L-% IV SOLN
INTRAVENOUS | Status: DC
  Administered 2012-01-15 – 2012-01-16 (×2): via INTRAVENOUS
  Filled 2012-01-15 (×4): qty 1000

## 2012-01-15 MED ORDER — HYDROMORPHONE HCL PF 1 MG/ML IJ SOLN
0.2500 mg | INTRAMUSCULAR | Status: DC | PRN
  Administered 2012-01-15 (×4): 0.5 mg via INTRAVENOUS

## 2012-01-15 MED ORDER — INSULIN ASPART 100 UNIT/ML ~~LOC~~ SOLN
0.0000 [IU] | Freq: Three times a day (TID) | SUBCUTANEOUS | Status: DC
  Administered 2012-01-15 – 2012-01-16 (×4): 3 [IU] via SUBCUTANEOUS
  Administered 2012-01-17: 8 [IU] via SUBCUTANEOUS
  Administered 2012-01-17: 2 [IU] via SUBCUTANEOUS
  Administered 2012-01-17: 3 [IU] via SUBCUTANEOUS
  Administered 2012-01-18 (×3): 2 [IU] via SUBCUTANEOUS
  Administered 2012-01-19 (×2): 3 [IU] via SUBCUTANEOUS

## 2012-01-15 MED ORDER — RAMIPRIL 10 MG PO CAPS
10.0000 mg | ORAL_CAPSULE | Freq: Every day | ORAL | Status: DC
  Administered 2012-01-15 – 2012-01-19 (×5): 10 mg via ORAL
  Filled 2012-01-15 (×5): qty 1

## 2012-01-15 MED ORDER — ACETAMINOPHEN 10 MG/ML IV SOLN
1000.0000 mg | Freq: Four times a day (QID) | INTRAVENOUS | Status: AC
  Administered 2012-01-15 – 2012-01-16 (×4): 1000 mg via INTRAVENOUS
  Filled 2012-01-15 (×4): qty 100

## 2012-01-15 MED ORDER — METOCLOPRAMIDE HCL 10 MG PO TABS: 5.0000 mg | ORAL_TABLET | Freq: Three times a day (TID) | ORAL | Status: DC | PRN

## 2012-01-15 MED ORDER — MENTHOL 3 MG MT LOZG: 1.0000 | LOZENGE | OROMUCOSAL | Status: DC | PRN

## 2012-01-15 MED ORDER — LACTATED RINGERS IV SOLN
INTRAVENOUS | Status: DC | PRN
  Administered 2012-01-15 (×2): via INTRAVENOUS

## 2012-01-15 MED ORDER — 0.9 % SODIUM CHLORIDE (POUR BTL) OPTIME
TOPICAL | Status: DC | PRN
  Administered 2012-01-15: 1000 mL

## 2012-01-15 MED ORDER — FLUTICASONE FUROATE 27.5 MCG/SPRAY NA SUSP: 2.0000 | Freq: Every day | NASAL | Status: DC

## 2012-01-15 MED ORDER — PROMETHAZINE HCL 25 MG/ML IJ SOLN: 6.2500 mg | INTRAMUSCULAR | Status: DC | PRN

## 2012-01-15 MED ORDER — PANTOPRAZOLE SODIUM 40 MG PO TBEC
40.0000 mg | DELAYED_RELEASE_TABLET | Freq: Every day | ORAL | Status: DC
  Administered 2012-01-16 – 2012-01-19 (×4): 40 mg via ORAL
  Filled 2012-01-15 (×4): qty 1

## 2012-01-15 MED ORDER — LIDOCAINE HCL (CARDIAC) 20 MG/ML IV SOLN
INTRAVENOUS | Status: DC | PRN
  Administered 2012-01-15: 100 mg via INTRAVENOUS

## 2012-01-15 MED ORDER — PHENOL 1.4 % MT LIQD: 1.0000 | OROMUCOSAL | Status: DC | PRN

## 2012-01-15 MED ORDER — METFORMIN HCL 500 MG PO TABS
1000.0000 mg | ORAL_TABLET | Freq: Two times a day (BID) | ORAL | Status: DC
  Administered 2012-01-16 – 2012-01-19 (×7): 1000 mg via ORAL
  Filled 2012-01-15 (×10): qty 2

## 2012-01-15 MED ORDER — ONDANSETRON HCL 4 MG/2ML IJ SOLN: 4.0000 mg | Freq: Four times a day (QID) | INTRAMUSCULAR | Status: DC | PRN

## 2012-01-15 MED ORDER — ATORVASTATIN CALCIUM 40 MG PO TABS
40.0000 mg | ORAL_TABLET | Freq: Every day | ORAL | Status: DC
  Administered 2012-01-15 – 2012-01-18 (×4): 40 mg via ORAL
  Filled 2012-01-15 (×5): qty 1

## 2012-01-15 MED ORDER — METOCLOPRAMIDE HCL 5 MG/ML IJ SOLN: 5.0000 mg | Freq: Three times a day (TID) | INTRAMUSCULAR | Status: DC | PRN

## 2012-01-15 MED ORDER — DIPHENHYDRAMINE HCL 12.5 MG/5ML PO ELIX
12.5000 mg | ORAL_SOLUTION | ORAL | Status: DC | PRN
  Filled 2012-01-15: qty 10

## 2012-01-15 MED ORDER — KETAMINE HCL 10 MG/ML IJ SOLN
INTRAMUSCULAR | Status: DC | PRN
  Administered 2012-01-15: 30 mg via INTRAVENOUS
  Administered 2012-01-15: 10 mg via INTRAVENOUS

## 2012-01-15 MED ORDER — ACETAMINOPHEN 650 MG RE SUPP: 650.0000 mg | Freq: Four times a day (QID) | RECTAL | Status: DC | PRN

## 2012-01-15 MED ORDER — ACYCLOVIR 5 % EX OINT
1.0000 "application " | TOPICAL_OINTMENT | Freq: Every day | CUTANEOUS | Status: DC | PRN
  Filled 2012-01-15: qty 30

## 2012-01-15 MED ORDER — PROPOFOL 10 MG/ML IV BOLUS
INTRAVENOUS | Status: DC | PRN
  Administered 2012-01-15: 150 mg via INTRAVENOUS

## 2012-01-15 MED ORDER — CEFAZOLIN SODIUM-DEXTROSE 2-3 GM-% IV SOLR
2.0000 g | Freq: Four times a day (QID) | INTRAVENOUS | Status: AC
  Administered 2012-01-15 – 2012-01-16 (×2): 2 g via INTRAVENOUS
  Filled 2012-01-15 (×2): qty 50

## 2012-01-15 MED ORDER — ACETAMINOPHEN 325 MG PO TABS
650.0000 mg | ORAL_TABLET | Freq: Four times a day (QID) | ORAL | Status: DC | PRN
  Filled 2012-01-15: qty 2

## 2012-01-15 MED ORDER — RIVAROXABAN 10 MG PO TABS
10.0000 mg | ORAL_TABLET | Freq: Every day | ORAL | Status: DC
  Administered 2012-01-16 – 2012-01-19 (×4): 10 mg via ORAL
  Filled 2012-01-15 (×6): qty 1

## 2012-01-15 MED ORDER — TRAMADOL HCL 50 MG PO TABS
50.0000 mg | ORAL_TABLET | Freq: Four times a day (QID) | ORAL | Status: DC | PRN
  Administered 2012-01-17 – 2012-01-18 (×2): 50 mg via ORAL
  Filled 2012-01-15 (×2): qty 1

## 2012-01-15 SURGICAL SUPPLY — 54 items
BAG SPEC THK2 15X12 ZIP CLS (MISCELLANEOUS) ×1
BAG ZIPLOCK 12X15 (MISCELLANEOUS) ×2 IMPLANT
BANDAGE ELASTIC 6 VELCRO ST LF (GAUZE/BANDAGES/DRESSINGS) ×2 IMPLANT
BANDAGE ESMARK 6X9 LF (GAUZE/BANDAGES/DRESSINGS) ×1 IMPLANT
BLADE SAG 18X100X1.27 (BLADE) ×2 IMPLANT
BLADE SAW SGTL 11.0X1.19X90.0M (BLADE) ×2 IMPLANT
BNDG CMPR 9X6 STRL LF SNTH (GAUZE/BANDAGES/DRESSINGS) ×1
BNDG ESMARK 6X9 LF (GAUZE/BANDAGES/DRESSINGS) ×2
BOWL SMART MIX CTS (DISPOSABLE) ×2 IMPLANT
CATH KIT ON-Q SILVERSOAK 5 (CATHETERS) ×1 IMPLANT
CATH KIT ON-Q SILVERSOAK 5IN (CATHETERS) ×2 IMPLANT
CEMENT HV SMART SET (Cement) ×4 IMPLANT
CLOTH BEACON ORANGE TIMEOUT ST (SAFETY) ×2 IMPLANT
CUFF TOURN SGL QUICK 34 (TOURNIQUET CUFF) ×2
CUFF TRNQT CYL 34X4X40X1 (TOURNIQUET CUFF) ×1 IMPLANT
DRAPE EXTREMITY T 121X128X90 (DRAPE) ×2 IMPLANT
DRAPE POUCH INSTRU U-SHP 10X18 (DRAPES) ×2 IMPLANT
DRAPE U-SHAPE 47X51 STRL (DRAPES) ×2 IMPLANT
DRSG ADAPTIC 3X8 NADH LF (GAUZE/BANDAGES/DRESSINGS) ×2 IMPLANT
DRSG PAD ABDOMINAL 8X10 ST (GAUZE/BANDAGES/DRESSINGS) ×2 IMPLANT
DURAPREP 26ML APPLICATOR (WOUND CARE) ×2 IMPLANT
ELECT REM PT RETURN 9FT ADLT (ELECTROSURGICAL) ×2
ELECTRODE REM PT RTRN 9FT ADLT (ELECTROSURGICAL) ×1 IMPLANT
EVACUATOR 1/8 PVC DRAIN (DRAIN) ×2 IMPLANT
FACESHIELD LNG OPTICON STERILE (SAFETY) ×10 IMPLANT
GLOVE BIO SURGEON STRL SZ8 (GLOVE) ×2 IMPLANT
GLOVE BIOGEL PI IND STRL 8 (GLOVE) ×2 IMPLANT
GLOVE BIOGEL PI INDICATOR 8 (GLOVE) ×2
GLOVE ECLIPSE 8.0 STRL XLNG CF (GLOVE) ×2 IMPLANT
GLOVE SURG SS PI 6.5 STRL IVOR (GLOVE) ×4 IMPLANT
GOWN STRL NON-REIN LRG LVL3 (GOWN DISPOSABLE) ×4 IMPLANT
GOWN STRL REIN XL XLG (GOWN DISPOSABLE) ×2 IMPLANT
HANDPIECE INTERPULSE COAX TIP (DISPOSABLE) ×2
IMMOBILIZER KNEE 20 (SOFTGOODS) ×2
IMMOBILIZER KNEE 20 THIGH 36 (SOFTGOODS) ×1 IMPLANT
KIT BASIN OR (CUSTOM PROCEDURE TRAY) ×2 IMPLANT
MANIFOLD NEPTUNE II (INSTRUMENTS) ×2 IMPLANT
NS IRRIG 1000ML POUR BTL (IV SOLUTION) ×2 IMPLANT
PACK TOTAL JOINT (CUSTOM PROCEDURE TRAY) ×2 IMPLANT
PAD ABD 7.5X8 STRL (GAUZE/BANDAGES/DRESSINGS) ×2 IMPLANT
PADDING CAST COTTON 6X4 STRL (CAST SUPPLIES) ×4 IMPLANT
POSITIONER SURGICAL ARM (MISCELLANEOUS) ×2 IMPLANT
SET HNDPC FAN SPRY TIP SCT (DISPOSABLE) ×1 IMPLANT
SPONGE GAUZE 4X4 12PLY (GAUZE/BANDAGES/DRESSINGS) ×2 IMPLANT
STRIP CLOSURE SKIN 1/2X4 (GAUZE/BANDAGES/DRESSINGS) ×3 IMPLANT
SUCTION FRAZIER 12FR DISP (SUCTIONS) ×2 IMPLANT
SUT MNCRL AB 4-0 PS2 18 (SUTURE) ×2 IMPLANT
SUT VIC AB 2-0 CT1 27 (SUTURE) ×6
SUT VIC AB 2-0 CT1 TAPERPNT 27 (SUTURE) ×3 IMPLANT
SUT VLOC 180 0 24IN GS25 (SUTURE) ×2 IMPLANT
TOWEL OR 17X26 10 PK STRL BLUE (TOWEL DISPOSABLE) ×4 IMPLANT
TRAY FOLEY CATH 14FRSI W/METER (CATHETERS) ×2 IMPLANT
WATER STERILE IRR 1500ML POUR (IV SOLUTION) ×4 IMPLANT
WRAP KNEE MAXI GEL POST OP (GAUZE/BANDAGES/DRESSINGS) ×3 IMPLANT

## 2012-01-15 NOTE — Anesthesia Postprocedure Evaluation (Signed)
Anesthesia Post Note  Patient: Steven [name]  Procedure(s) Performed: Procedure(s) (LRB): TOTAL KNEE ARTHROPLASTY (Right)  Anesthesia type: General  Patient location: PACU  Post pain: Pain level controlled  Post assessment: Post-op Vital signs reviewed  Last Vitals:  Filed Vitals:   01/15/12 1810  BP: 158/80  Pulse: 80  Temp: 36.4 C  Resp: 12    Post vital signs: Reviewed  Level of consciousness: sedated  Complications: No apparent anesthesia complications

## 2012-01-15 NOTE — Anesthesia Procedure Notes (Signed)
Procedure Name: Intubation Date/Time: 01/15/2012 2:35 PM Performed by: Leroy Libman L Patient Re-evaluated:Patient Re-evaluated prior to inductionOxygen Delivery Method: Circle system utilized Preoxygenation: Pre-oxygenation with 100% oxygen Intubation Type: IV induction Ventilation: Mask ventilation without difficulty and Oral airway inserted - appropriate to patient size Laryngoscope Size: Miller and 3 Grade View: Grade I Tube type: Oral Tube size: 8.0 mm Number of attempts: 1 Airway Equipment and Method: Stylet Placement Confirmation: ETT inserted through vocal cords under direct vision,  breath sounds checked- equal and bilateral and positive ETCO2 Secured at: 22 cm Tube secured with: Tape Dental Injury: Teeth and Oropharynx as per pre-operative assessment

## 2012-01-15 NOTE — Anesthesia Preprocedure Evaluation (Addendum)
Anesthesia Evaluation  Patient identified by MRN, date of birth, ID band Patient awake    Reviewed: Allergy & Precautions, H&P , NPO status   History of Anesthesia Complications Negative for: history of anesthetic complications  Airway Mallampati: II TM Distance: >3 FB Neck ROM: Full    Dental  (+) Dental Advisory Given and Teeth Intact   Pulmonary pneumonia -,  breath sounds clear to auscultation        Cardiovascular hypertension, Pt. on medications and Pt. on home beta blockers + CAD, + Past MI, + Cardiac Stents and + CABG + dysrhythmias Supra Ventricular Tachycardia Rhythm:Regular Rate:Normal  History obtained form chart and patient. Patient reports That work up last year with Dr. Dietrich Pates went well and no further work up was indicated.   Neuro/Psych  Neuromuscular disease negative neurological ROS     GI/Hepatic Neg liver ROS, GERD-  Medicated,  Endo/Other  diabetes, Type 2, Oral Hypoglycemic Agents  Renal/GU negative Renal ROS     Musculoskeletal negative musculoskeletal ROS (+)   Abdominal   Peds  Hematology   Anesthesia Other Findings   Reproductive/Obstetrics                          Anesthesia Physical Anesthesia Plan  ASA: III  Anesthesia Plan: General   Post-op Pain Management:    Induction: Intravenous  Airway Management Planned: Oral ETT  Additional Equipment:   Intra-op Plan:   Post-operative Plan: Extubation in OR  Informed Consent: I have reviewed the patients History and Physical, chart, labs and discussed the procedure including the risks, benefits and alternatives for the proposed anesthesia with the patient or authorized representative who has indicated his/her understanding and acceptance.   Dental advisory given  Plan Discussed with: CRNA  Anesthesia Plan Comments:        Anesthesia Quick Evaluation

## 2012-01-15 NOTE — Preoperative (Signed)
Beta Blockers   Reason not to administer Beta Blockers:Metoprolol at 2100 on 01/14/12

## 2012-01-15 NOTE — H&P (View-Only) (Signed)
Steven [name]  DOB: 10/05/1930 Married / Language: English / Race: White Male  Date of Admission:  01/15/2012  Chief Complaint:  Right Knee Pain  History of Present Illness The patient is a 78 year old male who comes in for a preoperative History and Physical. The patient is scheduled for a right total knee arthroplasty to be performed by Dr. Frank V. Aluisio, MD at  Hospital on 01/15/2012 . Please see the hospital record for complete dictated history and physical. The patient is a 78 year old male who presents today for follow up of their knee. The patient is being followed for their bilateral knee pain and osteoarthritis. The patient presents today following right knee cortisone injection with Steve, PAC on 10/10/11. Note for "Follow-up Knee": He said he needs injections today, but not sure which ones. He said he just had his back surgery, so he wants to get on the schedule for a total knee replacement on the right. He said both his knees start hurting while he is sleeping in the early mornings. He is not sure if it is because they get cold.  He feels like he's ready to go ahead and get the right knee replaced now. He is ready to proceed with knee surgery. They have been treated conservatively in the past for the above stated problem and despite conservative measures, they continue to have progressive pain and severe functional limitations and dysfunction. They have failed non-operative management. It is felt that they would benefit from undergoing total joint replacement. Risks and benefits of the procedure have been discussed with the patient and they elect to proceed with surgery. There are no active contraindications to surgery such as ongoing infection or rapidly progressive neurological disease.   Problem List/Past Medical Osteoarthritis, Knee (715.96) Scoliosis of lumbar spine (737.30) Lumbar disc degeneration (722.52) Pain, Hip (719.45)  Allergies No Known  Allergies   Family History Heart Disease. grandmother mothers side, grandfather mothers side and grandfather fathers side Cancer. sister Drug / Alcohol Addiction. father Liver Disease, Chronic. mother Hypertension. father Diabetes Mellitus. father and grandfather mothers side Cerebrovascular Accident. father Depression. father   Social History Marital status. married Living situation. live with spouse Illicit drug use. no Number of flights of stairs before winded. 1 Tobacco use. Former smoker. never smoker Tobacco / smoke exposure. no Pain Contract. no Current work status. working full time Children. 2 Exercise. Exercises rarely Drug/Alcohol Rehab (Previously). no Alcohol use. current drinker; drinks beer; only occasionally per week Drug/Alcohol Rehab (Currently). no No Known Drug Allergies. 11/23/2010 Post-Surgical Plans. Plan is to look into [facility]. Advance Directives. Living Will   Medication History GlyBURIDE (2.5MG Tablet, Oral) Active. Atorvastatin Calcium (40MG Tablet, Oral) Active. Diclofenac Sodium (75MG Tablet DR, Oral) Active. GlyBURIDE (5MG Tablet, Oral) Active. MetFORMIN HCl (1000MG Tablet, Oral) Active. Omeprazole (20MG Capsule DR, Oral) Active. Toprol XL (25MG Tablet ER 24HR, Oral) Active. Ramipril (10MG Capsule, Oral) Active. Pataday (0.2% Solution, Ophthalmic) Active. Veramyst (27.5MCG/SPRAY Suspension, Nasal) Active. Lipitor ( Oral) Specific dose unknown - Active. Ramipril ( Oral) Specific dose unknown - Active. Famotidine (20MG Tablet, Oral) Active. Fluocinonide (0.05% Cream, External) Active. Fish Oil Burp-Less ( Oral) Specific dose unknown - Active. Patanol (0.1% Solution, Ophthalmic) Active. Aspirin Childrens (81MG Tablet Chewable, Oral) Active.   Past Surgical History Heart Stents. One Tonsillectomy Vasectomy Sinus Surgery Coronary Artery Bypass Graft. 3 vessels Back Surgery. Date:  09/08/2011.   Medical History Hypercholesterolemia Anemia Kidney Stone Myocardial infarction. July 2004 ? mass   l gluteus Diabetes Mellitus, Type II Chronic Pain Coronary Artery Disease/Heart Disease   Review of Systems General:Not Present- Chills, Fever, Night Sweats, Fatigue, Weight Gain, Weight Loss and Memory Loss. Skin:Not Present- Hives, Itching, Rash, Eczema and Lesions. HEENT:Not Present- Tinnitus, Headache, Double Vision, Visual Loss, Hearing Loss and Dentures. Respiratory:Not Present- Shortness of breath with exertion, Shortness of breath at rest, Allergies, Coughing up blood and Chronic Cough. Cardiovascular:Not Present- Chest Pain, Racing/skipping heartbeats, Difficulty Breathing Lying Down, Murmur, Swelling and Palpitations. Gastrointestinal:Not Present- Bloody Stool, Heartburn, Abdominal Pain, Vomiting, Nausea, Constipation, Diarrhea, Difficulty Swallowing, Jaundice and Loss of appetitie. Male Genitourinary:Not Present- Urinary frequency, Blood in Urine, Weak urinary stream, Discharge, Flank Pain, Incontinence, Painful Urination, Urgency, Urinary Retention and Urinating at Night. Musculoskeletal:Present- Morning Stiffness. Not Present- Muscle Weakness, Muscle Pain, Joint Swelling, Joint Pain, Back Pain and Spasms. Neurological:Not Present- Tremor, Dizziness, Blackout spells, Paralysis, Difficulty with balance and Weakness. Psychiatric:Not Present- Insomnia.   Vitals Weight: 160 lb Height: 69 in Weight was reported by patient. Height was reported by patient. Body Surface Area: 1.88 m Body Mass Index: 23.63 kg/m Pulse: 68 (Regular) Resp.: 14 (Unlabored) BP: 138/70 (Sitting, Left Arm, Standard)    Physical Exam The physical exam findings are as follows:   General Mental Status - Alert, cooperative and good historian. General Appearance- pleasant. Not in acute distress. Orientation- Oriented X3. Build & Nutrition- Well nourished  and Well developed.   Head and Neck Head- normocephalic, atraumatic . Neck Global Assessment- supple. no bruit auscultated on the right and no bruit auscultated on the left.   Eye Pupil- Bilateral- Regular and Round. Motion- Bilateral- EOMI.   Chest and Lung Exam Auscultation: Breath sounds:- clear at anterior chest wall and - clear at posterior chest wall. Adventitious sounds:- No Adventitious sounds.   Cardiovascular Auscultation:Rhythm- Regular rate and rhythm. Heart Sounds- S1 WNL and S2 WNL. Murmurs & Other Heart Sounds: Murmur 1:Location- Sternal Border - Left. Timing- Early systolic. Grade- II/VI. Character- Low pitched.   Abdomen Palpation/Percussion:Tenderness- Abdomen is non-tender to palpation. Rigidity (guarding)- Abdomen is soft. Auscultation:Auscultation of the abdomen reveals - Bowel sounds normal.   Male Genitourinary Not done, not pertinent to present illness  Musculoskeletal On exam, well-developed male, in no apparent distress. The right knee shows no effusion. Marked crepitus on ROM of the right knee. Range is 5-120. There is no instability. Left knee has a trace effusion. Range is 5-120. Varus deformity. Tender medial greater than lateral. No instability.  RADIOGRAPHS: He has advanced end stage arthritis of both, slightly worse radiographically on the left. Symptomatically, however, he is worse on the right.  Assessment & Plan Osteoarthrosis NOS, lower leg (715.96) Impression: Right Knee  Note: Patient is for a right total knee replacement by Dr. Aluisio.  Plan is to go SNF after the hospital stay. He wants to look into [facility] / [facility].  PCP - Dr. Bruce Swords - Patient was seen preoperatively and felt to be stable for surgery. Cardiology - Dr. Paula Ross - Patient was seen preoperatively and felt to be stable for surgery.  Signed electronically by DREW L [name], PA-C 

## 2012-01-15 NOTE — Op Note (Signed)
Pre-operative diagnosis- Osteoarthritis  Right knee(s)  Post-operative diagnosis- Osteoarthritis Right knee(s)  Procedure-  Right  Total Knee Arthroplasty  Surgeon- Gus Rankin. Junior Kenedy, MD  Assistant- Dimitri Ped, PA-C   Anesthesia-  General and Spinal EBL-* No blood loss amount entered *  Drains Hemovac  Tourniquet time-  Total Tourniquet Time Documented: Thigh (Right) - 32 minutes   Complications- None  Condition-PACU - hemodynamically stable.   Brief Clinical Note  Steven [name] is a 78 y.o. year old male with end stage OA of his right knee with progressively worsening pain and dysfunction. He has constant pain, with activity and at rest and significant functional deficits with difficulties even with ADLs. He has had extensive non-op management including analgesics, injections of cortisone and viscosupplements, and home exercise program, but remains in significant pain with significant dysfunction. Radiographs show bone on bone arthritis medial and patellofemoral. He presents now for right Total Knee Arthroplasty.    Procedure in detail---   The patient is brought into the operating room and positioned supine on the operating table. After successful administration of  General,   a tourniquet is placed high on the  Right thigh(s) and the lower extremity is prepped and draped in the usual sterile fashion. Time out is performed by the operating team and then the  Right lower extremity is wrapped in Esmarch, knee flexed and the tourniquet inflated to 300 mmHg.       A midline incision is made with a ten blade through the subcutaneous tissue to the level of the extensor mechanism. A fresh blade is used to make a medial parapatellar arthrotomy. Soft tissue over the proximal medial tibia is subperiosteally elevated to the joint line with a knife and into the semimembranosus bursa with a Cobb elevator. Soft tissue over the proximal lateral tibia is elevated with attention being paid to  avoiding the patellar tendon on the tibial tubercle. The patella is everted, knee flexed 90 degrees and the ACL and PCL are removed. Findings are bone on bone medial and patellofemoral with large medial osteophytes.        The drill is used to create a starting hole in the distal femur and the canal is thoroughly irrigated with sterile saline to remove the fatty contents. The 5 degree Right  valgus alignment guide is placed into the femoral canal and the distal femoral cutting block is pinned to remove 10 mm off the distal femur. Resection is made with an oscillating saw.      The tibia is subluxed forward and the menisci are removed. The extramedullary alignment guide is placed referencing proximally at the medial aspect of the tibial tubercle and distally along the second metatarsal axis and tibial crest. The block is pinned to remove 2mm off the more deficient medial  side. Resection is made with an oscillating saw. Size 4is the most appropriate size for the tibia and the proximal tibia is prepared with the modular drill and keel punch for that size.      The femoral sizing guide is placed and size 4 is most appropriate. Rotation is marked off the epicondylar axis and confirmed by creating a rectangular flexion gap at 90 degrees. The size 4 cutting block is pinned in this rotation and the anterior, posterior and chamfer cuts are made with the oscillating saw. The intercondylar block is then placed and that cut is made.      Trial size 4 tibial component, trial size 4 posterior stabilized femur and a  12.5  mm posterior stabilized rotating platform insert trial is placed. Full extension is achieved with excellent varus/valgus and anterior/posterior balance throughout full range of motion. The patella is everted and thickness measured to be 25  mm. Free hand resection is taken to 15 mm, a 38 template is placed, lug holes are drilled, trial patella is placed, and it tracks normally. Osteophytes are removed off  the posterior femur with the trial in place. All trials are removed and the cut bone surfaces prepared with pulsatile lavage. Cement is mixed and once ready for implantation, the size 4 tibial implant, size  4 posterior stabilized femoral component, and the size 38 patella are cemented in place and the patella is held with the clamp. The trial insert is placed and the knee held in full extension. All extruded cement is removed and once the cement is hard the permanent 12.5 mm posterior stabilized rotating platform insert is placed into the tibial tray.      The wound is copiously irrigated with saline solution and the extensor mechanism closed over a hemovac drain with #1 PDS suture. The tourniquet is released for a total tourniquet time of 32  minutes. Flexion against gravity is 140 degrees and the patella tracks normally. Subcutaneous tissue is closed with 2.0 vicryl and subcuticular with running 4.0 Monocryl. The catheter for the Marcaine pain pump is placed and the pump is initiated. The incision is cleaned and dried and steri-strips and a bulky sterile dressing are applied. The limb is placed into a knee immobilizer and the patient is awakened and transported to recovery in stable condition.      Please note that a surgical assistant was a medical necessity for this procedure in order to perform it in a safe and expeditious manner. Surgical assistant was necessary to retract the ligaments and vital neurovascular structures to prevent injury to them and also necessary for proper positioning of the limb to allow for anatomic placement of the prosthesis.   Gus Rankin Eliyanah Elgersma, MD    01/15/2012, 3:37 PM

## 2012-01-15 NOTE — Interval H&P Note (Signed)
History and Physical Interval Note:  01/15/2012 2:23 PM  Steven [name]  has presented today for surgery, with the diagnosis of Osteoarthritis of the Right Knee  The various methods of treatment have been discussed with the patient and family. After consideration of risks, benefits and other options for treatment, the patient has consented to  Procedure(s) (LRB) with comments: TOTAL KNEE ARTHROPLASTY (Right) as a surgical intervention .  The patient's history has been reviewed, patient examined, no change in status, stable for surgery.  I have reviewed the patient's chart and labs.  Questions were answered to the patient's satisfaction.     Loanne Drilling

## 2012-01-15 NOTE — Transfer of Care (Signed)
Immediate Anesthesia Transfer of Care Note  Patient: Steven [name]  Procedure(s) Performed: Procedure(s) (LRB) with comments: TOTAL KNEE ARTHROPLASTY (Right)  Patient Location: PACU  Anesthesia Type: General  Level of Consciousness: awake, alert  and oriented  Airway & Oxygen Therapy: Patient Spontanous Breathing and Patient connected to face mask oxygen  Post-op Assessment: Report given to PACU RN and Post -op Vital signs reviewed and stable  Post vital signs: Reviewed and stable  Complications: No apparent anesthesia complications

## 2012-01-16 LAB — GLUCOSE, CAPILLARY
Glucose-Capillary: 174 mg/dL — ABNORMAL HIGH (ref 70–99)
Glucose-Capillary: 160 mg/dL — ABNORMAL HIGH (ref 70–99)

## 2012-01-16 LAB — CBC
HCT: 22.5 % — ABNORMAL LOW (ref 39.0–52.0)
Hemoglobin: 7.9 g/dL — ABNORMAL LOW (ref 13.0–17.0)
MCH: 31.6 pg (ref 26.0–34.0)
MCV: 90 fL (ref 78.0–100.0)
RBC: 2.5 MIL/uL — ABNORMAL LOW (ref 4.22–5.81)

## 2012-01-16 LAB — BASIC METABOLIC PANEL
BUN: 17 mg/dL (ref 6–23)
CO2: 25 mEq/L (ref 19–32)
Calcium: 8.4 mg/dL (ref 8.4–10.5)
Creatinine, Ser: 0.81 mg/dL (ref 0.50–1.35)
Glucose, Bld: 199 mg/dL — ABNORMAL HIGH (ref 70–99)

## 2012-01-16 LAB — URIC ACID: Uric Acid, Serum: 4.5 mg/dL (ref 4.0–7.8)

## 2012-01-16 LAB — PREPARE RBC (CROSSMATCH)

## 2012-01-16 MED ORDER — ZOLPIDEM TARTRATE 5 MG PO TABS
5.0000 mg | ORAL_TABLET | Freq: Once | ORAL | Status: AC
  Administered 2012-01-16: 5 mg via ORAL
  Filled 2012-01-16: qty 1

## 2012-01-16 MED ORDER — ZOLPIDEM TARTRATE 5 MG PO TABS
5.0000 mg | ORAL_TABLET | Freq: Every evening | ORAL | Status: DC | PRN
  Administered 2012-01-16: 5 mg via ORAL
  Filled 2012-01-16: qty 1

## 2012-01-16 NOTE — Progress Notes (Signed)
Clinical Social Work Department BRIEF PSYCHOSOCIAL ASSESSMENT 01/16/2012  Patient:  Steven [name], Steven [name]     Account Number:  [id number]     Admit date:  01/15/2012  Clinical Social Worker:  Candie Chroman  Date/Time:  01/16/2012 02:35 PM  Referred by:  Physician  Date Referred:  01/16/2012 Referred for  SNF Placement   Other Referral:   Interview type:  Patient Other interview type:    PSYCHOSOCIAL DATA Living Status:  WIFE Admitted from facility:   Level of care:   Primary support name:  Steven [name] Primary support relationship to patient:  SPOUSE Degree of support available:   supportive    CURRENT CONCERNS Current Concerns  Post-Acute Placement   Other Concerns:    SOCIAL WORK ASSESSMENT / PLAN Pt is a 53 yr. old gentleman living at home prior to hospitalization. CSW met with ptto assist with d/c planning.  Pt has made arrangements to have ST rehab at [facility] following hospital d/c . SNF contacted and has confirmed d/c plan. CSW will follow to assist with d/c planning to SNF.   Assessment/plan status:  Psychosocial Support/Ongoing Assessment of Needs Other assessment/ plan:   Information/referral to community resources:   None needed at this time.    PATIENT'S/FAMILY'S RESPONSE TO PLAN OF CARE: Pt is looking forward to rehab following d/c from hospital.   Cori Razor LCSW [phone]

## 2012-01-16 NOTE — Evaluation (Addendum)
Physical Therapy Evaluation Patient Details Name: Steven [name] MRN: [id number] DOB: Dec 27, 1930 Today's Date: 01/16/2012 Time:  -     PT Assessment / Plan / Recommendation Clinical Impression       PT Assessment  Patient needs continued PT services    Follow Up Recommendations  Post acute inpatient rehab    Barriers to Discharge        Equipment Recommendations  None recommended by PT    Recommendations for Other Services OT consult   Frequency 7X/week    Precautions / Restrictions Precautions Precautions: Knee Required Braces or Orthoses: Knee Immobilizer - Right Knee Immobilizer - Right: Discontinue once straight leg raise with < 10 degree lag Restrictions Weight Bearing Restrictions: No Other Position/Activity Restrictions: WBAT   Pertinent Vitals/Pain 6/10; pt premedicated, cold packs provided      Mobility  Bed Mobility Bed Mobility: Supine to Sit Supine to Sit: 3: Mod assist Details for Bed Mobility Assistance: cues for sequence and assist with R LE Transfers Transfers: Sit to Stand;Stand to Sit Sit to Stand: 3: Mod assist;4: Min assist Stand to Sit: 3: Mod assist;4: Min assist Details for Transfer Assistance: cues for use of UEs and for LE mangement Ambulation/Gait Ambulation/Gait Assistance: 7:Mod assist Assistive device: Rolling walker Gait Pattern: Step-to pattern Gait velocity: sloww    Shoulder Instructions     Exercises Total Joint Exercises Ankle Circles/Pumps: AROM;10 reps;Supine;Both Quad Sets: AROM;Both;10 reps;Supine Heel Slides: AAROM;10 reps;Supine;Right Straight Leg Raises: AAROM;Right;10 reps;Supine   PT Diagnosis: Difficulty walking  PT Problem List: Decreased range of motion;Decreased strength;Decreased activity tolerance;Decreased mobility;Decreased knowledge of use of DME;Pain PT Treatment Interventions: DME instruction;Gait training;Stair training;Functional mobility training;Therapeutic activities;Therapeutic  exercise;Patient/family education   PT Goals Acute Rehab PT Goals PT Goal Formulation: With patient Time For Goal Achievement: 01/23/12 Potential to Achieve Goals: Good Pt will go Supine/Side to Sit: with supervision Pt will go Sit to Supine/Side: with supervision Pt will go Sit to Stand: with supervision Pt will go Stand to Sit: with supervision Pt will Ambulate: 51 - 150 feet;with supervision;with rolling walker  Visit Information  Last PT Received On: 01/16/12 Assistance Needed: +2    Subjective Data  Subjective: I didn't sleep and my other knee is no good and my back hurts and my shoulder..... Patient Stated Goal: Resume previous lifestyle with decreased pain   Prior Functioning  Home Living Lives With: Spouse Communication Communication: No difficulties    Cognition  Overall Cognitive Status: Appears within functional limits for tasks assessed/performed Arousal/Alertness: Awake/alert Orientation Level: Appears intact for tasks assessed Behavior During Session: [facility] for tasks performed    Extremity/Trunk Assessment Right Upper Extremity Assessment RUE ROM/Strength/Tone: [facility] for tasks assessed Left Upper Extremity Assessment LUE ROM/Strength/Tone: WFL for tasks assessed Right Lower Extremity Assessment RLE ROM/Strength/Tone: Deficits RLE ROM/Strength/Tone Deficits: Quads 2/5 with AAROM at knee -10 - 40 Left Lower Extremity Assessment LLE ROM/Strength/Tone: WFL for tasks assessed   Balance    End of Session PT - End of Session Equipment Utilized During Treatment: Gait belt;Right knee immobilizer Activity Tolerance: Patient limited by fatigue;Patient limited by pain Patient left: in chair;with call bell/phone within reach Nurse Communication: Mobility status  GP     Sherill Wegener 01/16/2012, 3:25 PM

## 2012-01-16 NOTE — Progress Notes (Signed)
Clinical Social Work Department CLINICAL SOCIAL WORK PLACEMENT NOTE 01/16/2012  Patient:  Steven [name], Steven [name]  Account Number:  [id number] Admit date:  01/15/2012  Clinical Social Worker:  Cori Razor, LCSW  Date/time:  01/16/2012 02:50 PM  Clinical Social Work is seeking post-discharge placement for this patient at the following level of care:   SKILLED NURSING   (*CSW will update this form in Epic as items are completed)   01/16/2012  Patient/family provided with [facility] Department of Clinical Social Work's list of facilities offering this level of care within the geographic area requested by the patient (or if unable, by the patient's family).    Patient/family informed of their freedom to choose among providers that offer the needed level of care, that participate in Medicare, Medicaid or managed care program needed by the patient, have an available bed and are willing to accept the patient.    Patient/family informed of [facility]' ownership interest in [facility], as well as of the fact that they are under no obligation to receive care at this facility.  PASARR submitted to EDS on 01/16/2012 PASARR number received from EDS on 01/16/2012  FL2 transmitted to all facilities in geographic area requested by pt/family on  01/16/2012 FL2 transmitted to all facilities within larger geographic area on   Patient informed that his/her managed care company has contracts with or will negotiate with  certain facilities, including the following:     Patient/family informed of bed offers received:  01/16/2012 Patient [facility] at [facility] Physician recommends and patient chooses bed at    Patient to be transferred to  on   Patient to be transferred to facility by   The following physician request were entered in Epic:   Additional Comments:  Cori Razor LCSW [phone]

## 2012-01-16 NOTE — Progress Notes (Signed)
   Subjective: 1 Day Post-Op Procedure(s) (LRB): TOTAL KNEE ARTHROPLASTY (Right) Patient reports pain as mild.   Patient seen in rounds with Dr. Lequita Halt.  Patient is well, but has had some minor complaints of pain in the right great toe. He is concerned because he has a history of gout. He reports that once he had the Ambien last night he actually slept very well. He denies chest pain or shortness of breath. We will start therapy today. Plan is to go Skilled nursing facility after hospital stay.  Objective: Vital signs in last 24 hours: Temp:  [97 F (36.1 C)-98.5 F (36.9 C)] 98.1 F (36.7 C) (10/01 0628) Pulse Rate:  [69-94] 80  (10/01 0628) Resp:  [12-20] 14  (10/01 0628) BP: (103-176)/(61-85) 122/70 mmHg (10/01 0628) SpO2:  [97 %-100 %] 100 % (10/01 0628) FiO2 (%):  [97 %] 97 % (09/30 1710) Weight:  [77.111 kg (170 lb)] 77.111 kg (170 lb) (09/30 1710)  Intake/Output from previous day:  Intake/Output Summary (Last 24 hours) at 01/16/12 0759 Last data filed at 01/16/12 0700  Gross per 24 hour  Intake 3662.5 ml  Output   1445 ml  Net 2217.5 ml     Labs:  Basename 01/16/12 0350  HGB 7.9*    Basename 01/16/12 0350  WBC 7.2  RBC 2.50*  HCT 22.5*  PLT 164    Basename 01/16/12 0350  NA 129*  K 4.4  CL 96  CO2 25  BUN 17  CREATININE 0.81  GLUCOSE 199*  CALCIUM 8.4    EXAM General - Patient is Alert and Oriented Extremity - Neurologically intact Neurovascular intact Dorsiflexion/Plantar flexion intact Dressing - dressing C/D/I Motor Function - intact, moving foot and toes well on exam.  Hemovac pulled without difficulty.  Past Medical History  Diagnosis Date  . Diabetes mellitus     type II  . GERD (gastroesophageal reflux disease)   . Hyperlipidemia   . Anemia   . History of myocardial infarction   . Pneumonia     hx of  . Myocardial infarction 01-03-12    '04  . CAD (coronary artery disease) 2004    s/p inferior wall Mi 2004 with PTCA/Stent; s/p  CABG 2004  . Arthritis 01-03-12    osteoarthritis-knees  . Neuropathy 01-03-12    bil. feet    Assessment/Plan: 1 Day Post-Op Procedure(s) (LRB): TOTAL KNEE ARTHROPLASTY (Right) Principal Problem:  *OA (osteoarthritis) of knee Postoperative acute blood loss anemia  Advance diet Up with therapy Continue foley due to blood transfusion; will continue until blood transfusion complete Discharge to SNF tentative Thursday DVT Prophylaxis - Xarelto Weight-Bearing as tolerated to right leg No vaccines. D/C O2 and Pulse OX and try on Room Air  Will transfuse 2 units of blood today. He has dropped significantly from postop Hgb. He can resume therapy after completion of transfusion. Also checking uric acid level as patient has a history of gout and is currently experiencing great toe pain.      Steven [name] LAUREN 01/16/2012, 7:59 AM

## 2012-01-16 NOTE — Progress Notes (Signed)
Inpatient Diabetes Program Recommendations  AACE/ADA: New Consensus Statement on Inpatient Glycemic Control (2013)  Target Ranges:  Prepandial:   less than 140 mg/dL      Peak postprandial:   less than 180 mg/dL (1-2 hours)      Critically ill patients:  140 - 180 mg/dL   Reason for Visit: Some post-op hyperglycemia-- improved after correction  Inpatient Diabetes Program Recommendations HgbA1C: Last known Hgb A1C was 6.4 in May 2013.  Request MD consider ordering another Hgb A1C.   Note: Thank you. Radiah Lubinski S. Elsie Lincoln, RN, CNS, CDE  [phone])

## 2012-01-16 NOTE — Progress Notes (Signed)
Utilization review completed.  

## 2012-01-16 NOTE — Progress Notes (Signed)
Physical Therapy Treatment Patient Details Name: Steven [name] MRN: [id number] DOB: 04-30-1930 Today's Date: 01/16/2012 Time: 4098-1191 PT Time Calculation (min): 26 min  PT Assessment / Plan / Recommendation Comments on Treatment Session  Pt ambulated in hall and stood at bedside with assist to attempt urination    Follow Up Recommendations  Post acute inpatient rehab    Barriers to Discharge        Equipment Recommendations  None recommended by PT    Recommendations for Other Services OT consult  Frequency 7X/week   Plan Discharge plan remains appropriate    Precautions / Restrictions Precautions Precautions: Knee Required Braces or Orthoses: Knee Immobilizer - Right Knee Immobilizer - Right: Discontinue once straight leg raise with < 10 degree lag Restrictions Weight Bearing Restrictions: No Other Position/Activity Restrictions: WBAT   Pertinent Vitals/Pain 6/10    Mobility  Bed Mobility Bed Mobility: Sit to Supine Supine to Sit: 3: Mod assist Sit to Supine: 3: Mod assist Details for Bed Mobility Assistance: cues for sequence and assist with R LE Transfers Transfers: Sit to Stand;Stand to Sit Sit to Stand: 3: Mod assist;4: Min assist Stand to Sit: 3: Mod assist;4: Min assist Details for Transfer Assistance: cues for use of UEs and for LE mangement Ambulation/Gait Ambulation/Gait Assistance: 3: Mod assist Ambulation Distance (Feet): 38 Feet Assistive device: Rolling walker Ambulation/Gait Assistance Details: cues for posture, sequence, stride length, position from RW and saftey awareness Gait Pattern: Step-to pattern Gait velocity: sloww    Exercises Total Joint Exercises Ankle Circles/Pumps: AROM;10 reps;Supine;Both Quad Sets: AROM;Both;10 reps;Supine Heel Slides: AAROM;10 reps;Supine;Right Straight Leg Raises: AAROM;Right;10 reps;Supine   PT Diagnosis: Difficulty walking  PT Problem List: Decreased range of motion;Decreased strength;Decreased activity  tolerance;Decreased mobility;Decreased knowledge of use of DME;Pain PT Treatment Interventions: DME instruction;Gait training;Stair training;Functional mobility training;Therapeutic activities;Therapeutic exercise;Patient/family education   PT Goals Acute Rehab PT Goals PT Goal Formulation: With patient Time For Goal Achievement: 01/23/12 Potential to Achieve Goals: Good Pt will go Supine/Side to Sit: with supervision PT Goal: Supine/Side to Sit - Progress: Goal set today Pt will go Sit to Supine/Side: with supervision PT Goal: Sit to Supine/Side - Progress: Goal set today Pt will go Sit to Stand: with supervision PT Goal: Sit to Stand - Progress: Goal set today Pt will go Stand to Sit: with supervision PT Goal: Stand to Sit - Progress: Goal set today Pt will Ambulate: 51 - 150 feet;with supervision;with rolling walker PT Goal: Ambulate - Progress: Goal set today  Visit Information  Last PT Received On: 01/16/12 Assistance Needed: +2    Subjective Data  Subjective: No new complaints Patient Stated Goal: Resume previous lifestyle with decreased pain   Cognition  Overall Cognitive Status: Appears within functional limits for tasks assessed/performed Arousal/Alertness: Awake/alert Orientation Level: Appears intact for tasks assessed Behavior During Session: [facility] for tasks performed    Balance     End of Session PT - End of Session Equipment Utilized During Treatment: Gait belt;Right knee immobilizer Activity Tolerance: Patient limited by fatigue;Patient limited by pain Patient left: with call bell/phone within reach;in bed Nurse Communication: Mobility status   GP     Steven [name] 01/16/2012, 3:31 PM

## 2012-01-17 ENCOUNTER — Inpatient Hospital Stay (HOSPITAL_COMMUNITY): Payer: Medicare Other

## 2012-01-17 DIAGNOSIS — Y92239 Unspecified place in hospital as the place of occurrence of the external cause: Secondary | ICD-10-CM

## 2012-01-17 DIAGNOSIS — Z9289 Personal history of other medical treatment: Secondary | ICD-10-CM

## 2012-01-17 DIAGNOSIS — D62 Acute posthemorrhagic anemia: Secondary | ICD-10-CM

## 2012-01-17 DIAGNOSIS — W19XXXA Unspecified fall, initial encounter: Secondary | ICD-10-CM

## 2012-01-17 LAB — CBC
Hemoglobin: 9.6 g/dL — ABNORMAL LOW (ref 13.0–17.0)
MCH: 30.3 pg (ref 26.0–34.0)
MCV: 85.2 fL (ref 78.0–100.0)
Platelets: 157 10*3/uL (ref 150–400)
RBC: 3.17 MIL/uL — ABNORMAL LOW (ref 4.22–5.81)
WBC: 11.6 10*3/uL — ABNORMAL HIGH (ref 4.0–10.5)

## 2012-01-17 LAB — BASIC METABOLIC PANEL
CO2: 21 mEq/L (ref 19–32)
Calcium: 8.9 mg/dL (ref 8.4–10.5)
Chloride: 96 mEq/L (ref 96–112)
Creatinine, Ser: 0.96 mg/dL (ref 0.50–1.35)
Glucose, Bld: 275 mg/dL — ABNORMAL HIGH (ref 70–99)
Sodium: 129 mEq/L — ABNORMAL LOW (ref 135–145)

## 2012-01-17 LAB — GLUCOSE, CAPILLARY
Glucose-Capillary: 165 mg/dL — ABNORMAL HIGH (ref 70–99)
Glucose-Capillary: 287 mg/dL — ABNORMAL HIGH (ref 70–99)
Glucose-Capillary: 266 mg/dL — ABNORMAL HIGH (ref 70–99)
Glucose-Capillary: 145 mg/dL — ABNORMAL HIGH (ref 70–99)

## 2012-01-17 NOTE — Progress Notes (Addendum)
Subjective: 2 Days Post-Op Procedure(s) (LRB): TOTAL KNEE ARTHROPLASTY (Right) Patient reports pain as mild and moderate.   Patient seen in rounds for Dr. Lequita Halt.  The patient had a fall last night but did not recall getting up out of bed.  He was found in the floor but hospital staff with complaints of left upper extremity pain - forearm and shoulder.  Upon exam today, he also had some left hip pain.   Patient is having problems with left arm pain following a fall. Plan is to go Skilled nursing facility after hospital stay.  Objective: Vital signs in last 24 hours: Temp:  [97.5 F (36.4 C)-99.1 F (37.3 C)] 97.9 F (36.6 C) (10/02 1410) Pulse Rate:  [82-131] 107  (10/02 1410) Resp:  [14-18] 16  (10/02 1410) BP: (100-136)/(50-79) 121/69 mmHg (10/02 1410) SpO2:  [92 %-99 %] 98 % (10/02 1410)  Intake/Output from previous day:  Intake/Output Summary (Last 24 hours) at 01/17/12 1441 Last data filed at 01/17/12 1411  Gross per 24 hour  Intake   1430 ml  Output   1950 ml  Net   -520 ml    Intake/Output this shift: Total I/O In: 240 [P.O.:240] Out: 800 [Urine:800]  Labs:  [facility] 01/17/12 0545 01/16/12 1825 01/16/12 0350  HGB 9.6* 9.9* 7.9*    Basename 01/17/12 0545 01/16/12 1825 01/16/12 0350  WBC 11.6* -- 7.2  RBC 3.17* -- 2.50*  HCT 27.0* 28.9* --  PLT 157 -- 164    Basename 01/17/12 0545 01/16/12 0350  NA 129* 129*  K 4.5 4.4  CL 96 96  CO2 21 25  BUN 16 17  CREATININE 0.96 0.81  GLUCOSE 275* 199*  CALCIUM 8.9 8.4   No results found for this basename: LABPT:2,INR:2 in the last 72 hours  EXAM General - Patient is Alert, Appropriate and Oriented Extremity - Neurovascular intact Sensation intact distally Dorsiflexion/Plantar flexion intact No cellulitis present Left Upper Extremity - neurovascular intact, ecchymosis along the left forearm region. Sensation intact to the left arm.  Pain in the anterior and lateral left shoulder.  No pain on ROM of the  left wrist or elbow.  Some discomfort noted on PROM of the left shoulder. Dressing/Incision - clean, dry, no drainage, healing on the right knee. Motor Function - intact, moving foot and toes well on exam.   Past Medical History  Diagnosis Date  . Diabetes mellitus     type II  . GERD (gastroesophageal reflux disease)   . Hyperlipidemia   . Anemia   . History of myocardial infarction   . Pneumonia     hx of  . Myocardial infarction 01-03-12    '04  . CAD (coronary artery disease) 2004    s/p inferior wall Mi 2004 with PTCA/Stent; s/p CABG 2004  . Arthritis 01-03-12    osteoarthritis-knees  . Neuropathy 01-03-12    bil. feet    Assessment/Plan: 2 Days Post-Op Procedure(s) (LRB): TOTAL KNEE ARTHROPLASTY (Right) Principal Problem:  *OA (osteoarthritis) of knee Active Problems:  Postop Acute blood loss anemia  Postop Transfusion  Fall during current hospitalization   Plan for discharge tomorrow Discharge to SNF - looking into [facility].  DVT Prophylaxis - Xarelto Weight-Bearing as tolerated to right leg  [name], [name] 01/17/2012, 2:41 PM     Upon exam yesterday afternoon he was tender at the proximal humerus but able to move the arm with pain only on abduction. The radiographs were equivocal for a proximal humeral nondisplaced fracture at  the level of the greater tuberosity. Given his ability to move the arm well, and lack of significant rest pain, I feel that he probably has more of a bony contusion than an actual fracture. Treatment will involve mobilization to prevent stiffness, as even if it is a nondisplaced fracture it is stable and can be mobilized without limitations.

## 2012-01-17 NOTE — Progress Notes (Addendum)
OT Note:  Noted that pt plans SNF for rehab.  Will defer OT eval to that venue. [place], OTR/L [phone] 01/17/2012

## 2012-01-17 NOTE — Progress Notes (Signed)
57:  Department leadership called, message left.  Matt Babbish, PA-C  Notified of fall and assessment.  Orders received.

## 2012-01-17 NOTE — Progress Notes (Signed)
PA notified about results of x-rays and possible left nondisplaced humerus fracture. PA and Surgeon assessed pt and PA informed no weight bearing restrictions at this time. Will continue to monitor patient.

## 2012-01-17 NOTE — Progress Notes (Signed)
At 0400, the phlebotomist entered the patient room, discovering him in the floor.  She notified nursing staff.  Pt. Was in the floor, disoriented to place.  VS were taken, BP:132/69, P: 131, R: 18.  Pupils round, equal and brisk. Large bruise noted to Left forearm and mild bruising noted to Left hip. Complains of pain in LEFT elbow and forearm.  Denies hitting head.  Denies hip pain and no shortening or rotation noted in lower extremities.  Patient was assisted back to bed, 2 person assist,  Stable.

## 2012-01-17 NOTE — Progress Notes (Signed)
Physical Therapy Treatment Patient Details Name: DEKLAN MINAR MRN: [id number] DOB: September 29, 1930 Today's Date: 01/17/2012 Time: 1191-4782 PT Time Calculation (min): 25 min  PT Assessment / Plan / Recommendation Comments on Treatment Session  POD #2 R TKR. Pt was found on ther floor by lab tech around 4am falling on L side.  X rays done on L hip, pelvic and L UE.  Awaiting ortho clearance for any OOB activity.  Only performed TKR TE's this am and left pt in bed.    Follow Up Recommendations   (skilled nursing)    Barriers to Discharge        Equipment Recommendations       Recommendations for Other Services    Frequency 7X/week   Plan Discharge plan remains appropriate    Precautions / Restrictions Precautions Precautions: Knee Required Braces or Orthoses: Knee Immobilizer - Right Knee Immobilizer - Right: Discontinue once straight leg raise with < 10 degree lag Restrictions Weight Bearing Restrictions: No Other Position/Activity Restrictions: WBAT    Pertinent Vitals/Pain C/o 5/10 R knee pain during TE's ICE applied C/o L shoulder pain with motion    Mobility    awaiting Ortho clearance for any OOB activity 2nd fall   Exercises Total Joint Exercises Ankle Circles/Pumps: AROM;Both;10 reps;Supine Quad Sets: AROM;Both;10 reps;Supine Gluteal Sets: AROM;Both;10 reps;Supine Towel Squeeze: AROM;Both;10 reps;Supine Short Arc Quad: AAROM;Right;10 reps;Supine Heel Slides: AAROM;Right;10 reps;Supine Hip ABduction/ADduction: AAROM;Right;10 reps;Supine Straight Leg Raises: AAROM;Right;10 reps;Supine    PT Goals      progressing    Visit Information  Last PT Received On: 01/17/12 Assistance Needed: +2                   End of Session PT - End of Session Activity Tolerance: Patient limited by fatigue;Patient limited by pain Patient left: in bed;with call bell/phone within reach CPM Right Knee CPM Right Knee: Off  Felecia Shelling  PTA [facility]  Acute  Rehab Pager      [phone]

## 2012-01-17 NOTE — Progress Notes (Signed)
0515: Patient alert and oriented.  Aware that he did fall.  Awaiting xrays of LEFT forearm and elbow.

## 2012-01-18 ENCOUNTER — Telehealth: Payer: Self-pay | Admitting: Internal Medicine

## 2012-01-18 LAB — CBC
HCT: 20.8 % — ABNORMAL LOW (ref 39.0–52.0)
MCHC: 36.1 g/dL — ABNORMAL HIGH (ref 30.0–36.0)
MCV: 84.9 fL (ref 78.0–100.0)
Platelets: 141 10*3/uL — ABNORMAL LOW (ref 150–400)
RDW: 15.6 % — ABNORMAL HIGH (ref 11.5–15.5)
WBC: 8.8 10*3/uL (ref 4.0–10.5)

## 2012-01-18 LAB — GLUCOSE, CAPILLARY
Glucose-Capillary: 146 mg/dL — ABNORMAL HIGH (ref 70–99)
Glucose-Capillary: 170 mg/dL — ABNORMAL HIGH (ref 70–99)

## 2012-01-18 MED ORDER — ACETAMINOPHEN 325 MG PO TABS
650.0000 mg | ORAL_TABLET | Freq: Once | ORAL | Status: AC
  Administered 2012-01-18: 650 mg via ORAL

## 2012-01-18 MED ORDER — POLYSACCHARIDE IRON COMPLEX 150 MG PO CAPS
150.0000 mg | ORAL_CAPSULE | Freq: Every day | ORAL | Status: DC
  Administered 2012-01-18 – 2012-01-19 (×2): 150 mg via ORAL
  Filled 2012-01-18 (×2): qty 1

## 2012-01-18 MED ORDER — DIPHENHYDRAMINE HCL 25 MG PO CAPS
25.0000 mg | ORAL_CAPSULE | Freq: Once | ORAL | Status: AC
  Administered 2012-01-18: 25 mg via ORAL
  Filled 2012-01-18: qty 1

## 2012-01-18 NOTE — Progress Notes (Signed)
Physical Therapy Treatment Patient Details Name: Steven [name] MRN: [id number] DOB: [birth year] Today's Date: 01/18/2012 Time: 1000-1025 PT Time Calculation (min): 25 min  PT Assessment / Plan / Recommendation Comments on Treatment Session  POD # 3 am session.  HgB 7.5 scheduled to receive blood.  Per Ortho MD L humerus suspected to be a bone contusion and is allowed full activity, no restrictions. Assisted pt OOB to amb limited distance then positioned in recliner to perform TKR TE's.  Pt plans to D/C to SNF.    Follow Up Recommendations   (skilled nursing)    Barriers to Discharge        Equipment Recommendations       Recommendations for Other Services    Frequency 7X/week   Plan Discharge plan remains appropriate    Precautions / Restrictions   KI for amb until able to perform 10 active SLR Falls   Pertinent Vitals/Pain C/o 4/10 during TE's    Mobility  Bed Mobility Bed Mobility: Supine to Sit;Sitting - Scoot to Edge of Bed Supine to Sit: 3: Mod assist Sitting - Scoot to Edge of Bed: 3: Mod assist Details for Bed Mobility Assistance: increased time and mod assist to support R LE off bed   Transfers Transfers: Sit to Stand;Stand to Sit Sit to Stand: 1: +2 Total assist;From bed Sit to Stand: Patient Percentage: 70% Stand to Sit: 1: +2 Total assist;To chair/3-in-1 Stand to Sit: Patient Percentage: 70% Details for Transfer Assistance: + 2 assist 2nd low HgB and pt did not amb yesterday.  Mild c/o dizztness and max c/o fatigue.  50% VC's on proper tech and hand placement.  Ambulation/Gait Ambulation/Gait Assistance: 1: +2 Total assist Ambulation/Gait: Patient Percentage: 70% Ambulation Distance (Feet): 15 Feet Assistive device: Rolling walker Ambulation/Gait Assistance Details: 50% VC's on upright posture and limited distance 2nd low HgB.  Mild c/o dizzyness and max c/o fatigue. Gait Pattern: Step-to pattern;Decreased stance time - right;Decreased stride length;Trunk  flexed Gait velocity: decreased    Exercises Total Joint Exercises Ankle Circles/Pumps: AROM;Both;10 reps;Supine Quad Sets: AROM;Both;10 reps;Supine Gluteal Sets: AROM;Both;10 reps;Supine Towel Squeeze: AROM;Both;10 reps;Supine Short Arc Quad: AAROM;Right;10 reps;Supine Heel Slides: AAROM;Right;10 reps;Supine Hip ABduction/ADduction: AAROM;Right;10 reps;Supine Straight Leg Raises: AAROM;Right;10 reps;Supine    PT Goals                   progressing    Visit Information  Last PT Received On: 01/18/12 Assistance Needed: +2    Subjective Data  Subjective: I feel pretty good Patient Stated Goal: resume previous mobility   Cognition    AxOx3   Balance   poor+  End of Session PT - End of Session Equipment Utilized During Treatment: Gait belt Activity Tolerance: Patient limited by fatigue Patient left: in chair;with call bell/phone within reach;with family/visitor present Nurse Communication: Mobility status  Felecia Shelling  PTA [facility]  Acute  Rehab Pager     [phone]

## 2012-01-18 NOTE — Progress Notes (Signed)
Physical Therapy Treatment Patient Details Name: Steven [name] MRN: [id number] DOB: 08-Oct-1930 Today's Date: 01/18/2012 Time: 1478-2956 PT Time Calculation (min): 18 min  PT Assessment / Plan / Recommendation Comments on Treatment Session  POD #3 pm session.  Amb in hallway then assisted back to bed for CPM. No c/o shoulder pain.  Tolerated session well.    Follow Up Recommendations   (skilled nursing)    Barriers to Discharge        Equipment Recommendations       Recommendations for Other Services    Frequency 7X/week   Plan Discharge plan remains appropriate    Precautions / Restrictions   knee Falls   Pertinent Vitals/Pain C/o 5/10 during amb    Mobility  Bed Mobility Bed Mobility: Sit to Supine Supine to Sit: 3: Mod assist Sitting - Scoot to Edge of Bed: 3: Mod assist Sit to Supine: 3: Mod assist Details for Bed Mobility Assistance: mod assist to support B LE up onto bed and increased time to position to comfort 2nd back Hx  Transfers Transfers: Sit to Stand;Stand to Sit Sit to Stand: 1: +2 Total assist;From chair/3-in-1 Sit to Stand: Patient Percentage: 80% Stand to Sit: To bed;1: +2 Total assist Stand to Sit: Patient Percentage: 80% Details for Transfer Assistance: On 2 nd unit of blood.  50% VC's on hand placement and increased time.  Ambulation/Gait Ambulation/Gait Assistance: 1: +2 Total assist Ambulation/Gait: Patient Percentage: 80% Ambulation Distance (Feet): 25 Feet Assistive device: Rolling walker Ambulation/Gait Assistance Details: 50% VC's to "stay inside walker" as pt was advancing it too far forward. No c/o dizzyness. Amb from recliner to bed. Gait Pattern: Step-to pattern;Decreased stance time - right;Trunk flexed;Shuffle;Decreased stride length Gait velocity: decreased        PT Goals                               progressing    Visit Information  Last PT Received On: 01/18/12 Assistance Needed: +2    Subjective Data  Subjective: I  feel pretty good Patient Stated Goal: resume previous mobility   Cognition    AxO x 3   Balance   fair  End of Session PT - End of Session Equipment Utilized During Treatment: Gait belt Activity Tolerance: Patient tolerated treatment well Patient left: in bed;with call bell/phone within reach Nurse Communication: Mobility status   Felecia Shelling  PTA [facility]  Acute  Rehab Pager     [phone]

## 2012-01-18 NOTE — Progress Notes (Signed)
Subjective: 3 Days Post-Op Procedure(s) (LRB): TOTAL KNEE ARTHROPLASTY (Right) Patient reports pain as mild.   Patient seen in rounds by Dr. Lequita Halt. Patient is well, but has had some minor complaints of pain in the knee, requiring pain medications and in the left shoulder Plan is to go Skilled nursing facility after hospital stay.  He is looking into [facility].   Objective: Vital signs in last 24 hours: Temp:  [97.9 F (36.6 C)-98.4 F (36.9 C)] 98.4 F (36.9 C) (10/03 0624) Pulse Rate:  [97-107] 97  (10/03 0624) Resp:  [16] 16  (10/03 0624) BP: (121-134)/(69-71) 134/71 mmHg (10/03 0624) SpO2:  [93 %-98 %] 93 % (10/03 0624)  Intake/Output from previous day:  Intake/Output Summary (Last 24 hours) at 01/18/12 0752 Last data filed at 01/18/12 0627  Gross per 24 hour  Intake   1080 ml  Output   2175 ml  Net  -1095 ml    Intake/Output this shift:    Labs:  Basename 01/18/12 0414 01/17/12 0545 01/16/12 1825 01/16/12 0350  HGB 7.5* 9.6* 9.9* 7.9*    Basename 01/18/12 0414 01/17/12 0545  WBC 8.8 11.6*  RBC 2.45* 3.17*  HCT 20.8* 27.0*  PLT 141* 157    Basename 01/17/12 0545 01/16/12 0350  NA 129* 129*  K 4.5 4.4  CL 96 96  CO2 21 25  BUN 16 17  CREATININE 0.96 0.81  GLUCOSE 275* 199*  CALCIUM 8.9 8.4   No results found for this basename: LABPT:2,INR:2 in the last 72 hours  EXAM General - Patient is Alert, Appropriate and Oriented Extremity - Neurovascular intact Sensation intact distally Dorsiflexion/Plantar flexion intact No cellulitis present Left Upper Extremity - neurovascular intact, ecchymosis along the left forearm region. Sensation intact to the left arm. Pain in the anterior and lateral left shoulder with abduction. No pain on ROM of the left wrist or elbow. Dressing/Incision - clean, dry, no drainage, healing Motor Function - intact, moving foot and toes well on exam.   Past Medical History  Diagnosis Date  . Diabetes mellitus     type II    . GERD (gastroesophageal reflux disease)   . Hyperlipidemia   . Anemia   . History of myocardial infarction   . Pneumonia     hx of  . Myocardial infarction 01-03-12    '04  . CAD (coronary artery disease) 2004    s/p inferior wall Mi 2004 with PTCA/Stent; s/p CABG 2004  . Arthritis 01-03-12    osteoarthritis-knees  . Neuropathy 01-03-12    bil. feet    Assessment/Plan: 3 Days Post-Op Procedure(s) (LRB): TOTAL KNEE ARTHROPLASTY (Right) Principal Problem:  *OA (osteoarthritis) of knee Active Problems:  Postop Acute blood loss anemia  Postop Transfusion  Fall during current hospitalization   Plan for discharge tomorrow Discharge to SNF Blood Today and recheck labs in AM DVT Prophylaxis - Xarelto Weight-Bearing as tolerated to right leg  Upon exam per Dr. Lequita Halt,  he was tender at the proximal humerus but able to move the arm with pain only on abduction. The radiographs were equivocal for a proximal humeral nondisplaced fracture at the level of the greater tuberosity. Given his ability to move the arm well, and lack of significant rest pain, I feel that he probably has more of a bony contusion than an actual fracture. Treatment will involve mobilization to prevent stiffness, as even if it is a nondisplaced fracture it is stable and can be mobilized without limitations.  Karlena Luebke 01/18/2012,  7:52 AM

## 2012-01-18 NOTE — Telephone Encounter (Signed)
[facility] called and needs to know if pt has had a pneumonia and flu vaxs? Needs to know dates. Pls call asap.

## 2012-01-19 DIAGNOSIS — G589 Mononeuropathy, unspecified: Secondary | ICD-10-CM | POA: Diagnosis not present

## 2012-01-19 DIAGNOSIS — D649 Anemia, unspecified: Secondary | ICD-10-CM | POA: Diagnosis not present

## 2012-01-19 DIAGNOSIS — E785 Hyperlipidemia, unspecified: Secondary | ICD-10-CM | POA: Diagnosis not present

## 2012-01-19 DIAGNOSIS — I2510 Atherosclerotic heart disease of native coronary artery without angina pectoris: Secondary | ICD-10-CM | POA: Diagnosis not present

## 2012-01-19 DIAGNOSIS — Z96659 Presence of unspecified artificial knee joint: Secondary | ICD-10-CM | POA: Diagnosis not present

## 2012-01-19 DIAGNOSIS — D62 Acute posthemorrhagic anemia: Secondary | ICD-10-CM | POA: Diagnosis not present

## 2012-01-19 DIAGNOSIS — J811 Chronic pulmonary edema: Secondary | ICD-10-CM | POA: Diagnosis not present

## 2012-01-19 DIAGNOSIS — S99929A Unspecified injury of unspecified foot, initial encounter: Secondary | ICD-10-CM | POA: Diagnosis not present

## 2012-01-19 DIAGNOSIS — M25569 Pain in unspecified knee: Secondary | ICD-10-CM | POA: Diagnosis not present

## 2012-01-19 DIAGNOSIS — S8990XA Unspecified injury of unspecified lower leg, initial encounter: Secondary | ICD-10-CM | POA: Diagnosis not present

## 2012-01-19 DIAGNOSIS — K5900 Constipation, unspecified: Secondary | ICD-10-CM | POA: Diagnosis not present

## 2012-01-19 DIAGNOSIS — Z5189 Encounter for other specified aftercare: Secondary | ICD-10-CM | POA: Diagnosis not present

## 2012-01-19 DIAGNOSIS — E119 Type 2 diabetes mellitus without complications: Secondary | ICD-10-CM | POA: Diagnosis not present

## 2012-01-19 DIAGNOSIS — E7800 Pure hypercholesterolemia, unspecified: Secondary | ICD-10-CM | POA: Diagnosis not present

## 2012-01-19 DIAGNOSIS — G4700 Insomnia, unspecified: Secondary | ICD-10-CM | POA: Diagnosis not present

## 2012-01-19 DIAGNOSIS — M1710 Unilateral primary osteoarthritis, unspecified knee: Secondary | ICD-10-CM | POA: Diagnosis not present

## 2012-01-19 DIAGNOSIS — Z951 Presence of aortocoronary bypass graft: Secondary | ICD-10-CM | POA: Diagnosis not present

## 2012-01-19 DIAGNOSIS — D531 Other megaloblastic anemias, not elsewhere classified: Secondary | ICD-10-CM | POA: Diagnosis not present

## 2012-01-19 LAB — BASIC METABOLIC PANEL
BUN: 15 mg/dL (ref 6–23)
CO2: 25 mEq/L (ref 19–32)
Chloride: 99 mEq/L (ref 96–112)
Creatinine, Ser: 0.88 mg/dL (ref 0.50–1.35)
Glucose, Bld: 172 mg/dL — ABNORMAL HIGH (ref 70–99)
Potassium: 4.2 mEq/L (ref 3.5–5.1)

## 2012-01-19 LAB — TYPE AND SCREEN
ABO/RH(D): A POS
Unit division: 0
Unit division: 0

## 2012-01-19 LAB — GLUCOSE, CAPILLARY
Glucose-Capillary: 160 mg/dL — ABNORMAL HIGH (ref 70–99)
Glucose-Capillary: 172 mg/dL — ABNORMAL HIGH (ref 70–99)

## 2012-01-19 LAB — CBC
HCT: 25.7 % — ABNORMAL LOW (ref 39.0–52.0)
Hemoglobin: 9.1 g/dL — ABNORMAL LOW (ref 13.0–17.0)
MCH: 29.7 pg (ref 26.0–34.0)
MCHC: 35.4 g/dL (ref 30.0–36.0)
MCV: 84 fL (ref 78.0–100.0)
RDW: 15.4 % (ref 11.5–15.5)

## 2012-01-19 MED ORDER — BISACODYL 10 MG RE SUPP: 10.0000 mg | Freq: Every day | RECTAL | Status: DC | PRN

## 2012-01-19 MED ORDER — METHOCARBAMOL 500 MG PO TABS: 500.0000 mg | ORAL_TABLET | Freq: Four times a day (QID) | ORAL | Status: DC | PRN

## 2012-01-19 MED ORDER — ACETAMINOPHEN 325 MG PO TABS: 650.0000 mg | ORAL_TABLET | Freq: Four times a day (QID) | ORAL | Status: DC | PRN

## 2012-01-19 MED ORDER — ONDANSETRON HCL 4 MG PO TABS: 4.0000 mg | ORAL_TABLET | Freq: Four times a day (QID) | ORAL | Status: DC | PRN

## 2012-01-19 MED ORDER — RIVAROXABAN 10 MG PO TABS: 10.0000 mg | ORAL_TABLET | Freq: Every day | ORAL | Status: DC

## 2012-01-19 MED ORDER — POLYETHYLENE GLYCOL 3350 17 G PO PACK: 17.0000 g | PACK | Freq: Every day | ORAL | Status: DC | PRN

## 2012-01-19 MED ORDER — DSS 100 MG PO CAPS: 100.0000 mg | ORAL_CAPSULE | Freq: Two times a day (BID) | ORAL | Status: DC

## 2012-01-19 MED ORDER — TRAMADOL HCL 50 MG PO TABS: 50.0000 mg | ORAL_TABLET | Freq: Four times a day (QID) | ORAL | Status: DC | PRN

## 2012-01-19 MED ORDER — POLYSACCHARIDE IRON COMPLEX 150 MG PO CAPS: 150.0000 mg | ORAL_CAPSULE | Freq: Every day | ORAL | Status: DC

## 2012-01-19 NOTE — Progress Notes (Signed)
   Subjective: 4 Days Post-Op Procedure(s) (LRB): TOTAL KNEE ARTHROPLASTY (Right) Patient reports pain as mild.   Patient seen in rounds for Dr. Lequita Halt.  He is doing much better today. Patient is well, and has had no acute complaints or problems Patient is ready to go to [facility] today.  Objective: Vital signs in last 24 hours: Temp:  [97.5 F (36.4 C)-99.3 F (37.4 C)] 98.7 F (37.1 C) (10/04 0515) Pulse Rate:  [79-93] 81  (10/04 0515) Resp:  [16-20] 16  (10/04 0515) BP: (99-138)/(57-77) 133/74 mmHg (10/04 0515) SpO2:  [92 %-97 %] 97 % (10/04 0515)  Intake/Output from previous day:  Intake/Output Summary (Last 24 hours) at 01/19/12 0811 Last data filed at 01/19/12 0330  Gross per 24 hour  Intake    625 ml  Output   2600 ml  Net  -1975 ml    Intake/Output this shift:    Labs:  Basename 01/19/12 0400 01/18/12 0414 01/17/12 0545 01/16/12 1825  HGB 9.1* 7.5* 9.6* 9.9*    Basename 01/19/12 0400 01/18/12 0414  WBC 7.8 8.8  RBC 3.06* 2.45*  HCT 25.7* 20.8*  PLT 160 141*    Basename 01/19/12 0400 01/17/12 0545  NA 132* 129*  K 4.2 4.5  CL 99 96  CO2 25 21  BUN 15 16  CREATININE 0.88 0.96  GLUCOSE 172* 275*  CALCIUM 9.1 8.9   No results found for this basename: LABPT:2,INR:2 in the last 72 hours  EXAM: General - Patient is Alert, Appropriate and Oriented Extremity - Neurovascular intact Sensation intact distally Dorsiflexion/Plantar flexion intact No cellulitis present Incision - clean, dry, no drainage, healing Motor Function - intact, moving foot and toes well on exam.  Left Upper Extremity - neurovascular intact, ecchymosis along the left forearm region. Sensation intact to the left arm. Pain in the anterior and lateral left shoulder with abduction. No pain on ROM of the left wrist or elbow.  Assessment/Plan: 4 Days Post-Op Procedure(s) (LRB): TOTAL KNEE ARTHROPLASTY (Right) Procedure(s) (LRB): TOTAL KNEE ARTHROPLASTY (Right) Past Medical  History  Diagnosis Date  . Diabetes mellitus     type II  . GERD (gastroesophageal reflux disease)   . Hyperlipidemia   . Anemia   . History of myocardial infarction   . Pneumonia     hx of  . Myocardial infarction 01-03-12    '04  . CAD (coronary artery disease) 2004    s/p inferior wall Mi 2004 with PTCA/Stent; s/p CABG 2004  . Arthritis 01-03-12    osteoarthritis-knees  . Neuropathy 01-03-12    bil. feet   Principal Problem:  *OA (osteoarthritis) of knee Active Problems:  Postop Acute blood loss anemia  Postop Transfusion  Fall during current hospitalization   Discharge to SNF Diet - Cardiac diet Follow up - in 2 weeks Activity - WBAT Disposition - Home Condition Upon Discharge - Good D/C Meds - See DC Summary DVT Prophylaxis - Xarelto  Teyonna Plaisted 01/19/2012, 8:11 AM

## 2012-01-19 NOTE — Progress Notes (Signed)
Physical Therapy Treatment Patient Details Name: Steven [name] MRN: [id number] DOB: 08/11/1930 Today's Date: 01/19/2012 Time: 4132-4401 PT Time Calculation (min): 24 min  PT Assessment / Plan / Recommendation Comments on Treatment Session  POD#4: pt progressing well; possible D/C to SNF today...agree with this plan and pt will benefit    Follow Up Recommendations  Post acute inpatient rehab    Barriers to Discharge        Equipment Recommendations  None recommended by PT    Recommendations for Other Services    Frequency 7X/week   Plan Discharge plan remains appropriate;Frequency remains appropriate    Precautions / Restrictions Precautions Precautions: Knee Precaution Comments: no pillow under knee Knee Immobilizer - Right: Discontinue once straight leg raise with < 10 degree lag Restrictions Other Position/Activity Restrictions: WBAT   Pertinent Vitals/Pain     Mobility  Bed Mobility Bed Mobility: Sit to Supine Supine to Sit: 4: Min assist Sitting - Scoot to Edge of Bed: 4: Min assist Details for Bed Mobility Assistance: cues for technique Transfers Transfers: Sit to Stand;Stand to Sit Sit to Stand: 3: Mod assist;From bed;With upper extremity assist;From elevated surface;4: Min assist Stand to Sit: 3: Mod assist;With armrests;To chair/3-in-1 Details for Transfer Assistance: cues for hand placemetn and control of wt shift Ambulation/Gait Ambulation/Gait Assistance: 4: Min assist;3: Mod assist Ambulation Distance (Feet): 20 Feet Assistive device: Rolling walker Ambulation/Gait Assistance Details: requires increased time; cues for sequence, posture and RW distance from self Gait Pattern: Step-to pattern;Decreased stance time - right;Trunk flexed;Shuffle;Decreased stride length Gait velocity: decreased    Exercises Total Joint Exercises Ankle Circles/Pumps: AROM;Both;10 reps;Supine Quad Sets: AROM;Both;10 reps Heel Slides: AROM;AAROM;Right;10 reps Hip  ABduction/ADduction: AAROM;AROM;10 reps;Right Straight Leg Raises: AAROM;Right;10 reps   PT Diagnosis:    PT Problem List:   PT Treatment Interventions:     PT Goals Acute Rehab PT Goals Time For Goal Achievement: 01/23/12 Potential to Achieve Goals: Good Pt will go Supine/Side to Sit: with supervision PT Goal: Supine/Side to Sit - Progress: Progressing toward goal Pt will go Sit to Stand: with supervision PT Goal: Sit to Stand - Progress: Progressing toward goal Pt will go Stand to Sit: with supervision PT Goal: Stand to Sit - Progress: Progressing toward goal Pt will Ambulate: 51 - 150 feet;with supervision;with rolling walker PT Goal: Ambulate - Progress: Progressing toward goal  Visit Information  Last PT Received On: 01/19/12 Assistance Needed: +1    Subjective Data  Subjective: I am doing well Patient Stated Goal: home   Cognition  Overall Cognitive Status: Appears within functional limits for tasks assessed/performed Arousal/Alertness: Awake/alert Orientation Level: Appears intact for tasks assessed Behavior During Session: [facility] for tasks performed    Balance     End of Session PT - End of Session Equipment Utilized During Treatment: Gait belt;Right knee immobilizer Activity Tolerance: Patient tolerated treatment well Patient left: in chair;with call bell/phone within reach   GP     [facility] 01/19/2012, 9:24 AM

## 2012-01-19 NOTE — Telephone Encounter (Signed)
They were informed-per our records

## 2012-01-19 NOTE — Progress Notes (Signed)
Clinical Social Work Department CLINICAL SOCIAL WORK PLACEMENT NOTE 01/19/2012  Patient:  Steven [name], Steven [name]  Account Number:  [id number] Admit date:  01/15/2012  Clinical Social Worker:  Cori Razor, LCSW  Date/time:  01/16/2012 02:50 PM  Clinical Social Work is seeking post-discharge placement for this patient at the following level of care:   SKILLED NURSING   (*CSW will update this form in Epic as items are completed)   01/16/2012  Patient/family provided with [facility] Department of Clinical Social Work's list of facilities offering this level of care within the geographic area requested by the patient (or if unable, by the patient's family).    Patient/family informed of their freedom to choose among providers that offer the needed level of care, that participate in Medicare, Medicaid or managed care program needed by the patient, have an available bed and are willing to accept the patient.    Patient/family informed of [facility]' ownership interest in [facility], as well as of the fact that they are under no obligation to receive care at this facility.  PASARR submitted to EDS on 01/16/2012 PASARR number received from EDS on 01/16/2012  FL2 transmitted to all facilities in geographic area requested by pt/family on  01/16/2012 FL2 transmitted to all facilities within larger geographic area on   Patient informed that his/her managed care company has contracts with or will negotiate with  certain facilities, including the following:     Patient/family informed of bed offers received:  01/16/2012 Patient [facility] bed at [facility] Physician recommends and patient chooses bed at    Patient to be transferred to [facility] on  01/19/2012 Patient to be transferred to facility by [facility]  The following physician request were entered in Epic:   Additional Comments : Pt had originally accepted rehab bed at [facility] but SNF was unable to provided pvt room.    Cori Razor LCSW [phone]

## 2012-01-19 NOTE — Discharge Summary (Signed)
Physician Discharge Summary   Patient ID: Steven [name] MRN: [id number] DOB/AGE: 1930/05/25 78 y.o.  Admit date: 01/15/2012 Discharge date: 01/19/2012  Primary Diagnosis: Osteoarthritis Right knee   Admission Diagnoses:  Past Medical History  Diagnosis Date  . Diabetes mellitus     type II  . GERD (gastroesophageal reflux disease)   . Hyperlipidemia   . Anemia   . History of myocardial infarction   . Pneumonia     hx of  . Myocardial infarction 01-03-12    '04  . CAD (coronary artery disease) 2004    s/p inferior wall Mi 2004 with PTCA/Stent; s/p CABG 2004  . Arthritis 01-03-12    osteoarthritis-knees  . Neuropathy 01-03-12    bil. feet   Discharge Diagnoses:   Principal Problem:  *OA (osteoarthritis) of knee Active Problems:  Postop Acute blood loss anemia  Postop Transfusion  Fall during current hospitalization  Procedure:  Procedure(s) (LRB): TOTAL KNEE ARTHROPLASTY (Right)   Consults: None  HPI: Steven [name] is a 78 y.o. year old male with end stage OA of his right knee with progressively worsening pain and dysfunction. He has constant pain, with activity and at rest and significant functional deficits with difficulties even with ADLs. He has had extensive non-op management including analgesics, injections of cortisone and viscosupplements, and home exercise program, but remains in significant pain with significant dysfunction. Radiographs show bone on bone arthritis medial and patellofemoral. He presents now for right Total Knee Arthroplasty.   Laboratory Data: Hospital Outpatient Visit on 01/03/2012  Component Date Value Range Status  . aPTT 01/03/2012 31  24 - 37 seconds Final  . WBC 01/03/2012 6.4  4.0 - 10.5 K/uL Final  . RBC 01/03/2012 3.72* 4.22 - 5.81 MIL/uL Final  . Hemoglobin 01/03/2012 11.2* 13.0 - 17.0 g/dL Final  . HCT 82/95/6213 33.8* 39.0 - 52.0 % Final  . MCV 01/03/2012 90.9  78.0 - 100.0 fL Final  . MCH 01/03/2012 30.1  26.0 - 34.0 pg Final    . MCHC 01/03/2012 33.1  30.0 - 36.0 g/dL Final  . RDW 08/65/7846 13.9  11.5 - 15.5 % Final  . Platelets 01/03/2012 228  150 - 400 K/uL Final  . Sodium 01/03/2012 138  135 - 145 mEq/L Final  . Potassium 01/03/2012 4.6  3.5 - 5.1 mEq/L Final  . Chloride 01/03/2012 101  96 - 112 mEq/L Final  . CO2 01/03/2012 26  19 - 32 mEq/L Final  . Glucose, Bld 01/03/2012 79  70 - 99 mg/dL Final  . BUN 96/29/5284 24* 6 - 23 mg/dL Final  . Creatinine, Ser 01/03/2012 0.84  0.50 - 1.35 mg/dL Final  . Calcium 13/24/4010 10.0  8.4 - 10.5 mg/dL Final  . Total Protein 01/03/2012 6.7  6.0 - 8.3 g/dL Final  . Albumin 27/25/3664 3.9  3.5 - 5.2 g/dL Final  . AST 40/34/7425 32  0 - 37 U/L Final  . ALT 01/03/2012 32  0 - 53 U/L Final  . Alkaline Phosphatase 01/03/2012 70  39 - 117 U/L Final  . Total Bilirubin 01/03/2012 0.3  0.3 - 1.2 mg/dL Final  . GFR calc non Af Amer 01/03/2012 82* >90 mL/min Final  . GFR calc Af Amer 01/03/2012 >90  >90 mL/min Final   Comment:                                 The eGFR has been  calculated                          using the CKD EPI equation.                          This calculation has not been                          validated in all clinical                          situations.                          eGFR's persistently                          <90 mL/min signify                          possible Chronic Kidney Disease.  Marland Kitchen Prothrombin Time 01/03/2012 13.2  11.6 - 15.2 seconds Final  . INR 01/03/2012 1.01  0.00 - 1.49 Final  . Color, Urine 01/03/2012 YELLOW  YELLOW Final  . APPearance 01/03/2012 CLEAR  CLEAR Final  . Specific Gravity, Urine 01/03/2012 1.016  1.005 - 1.030 Final  . pH 01/03/2012 5.5  5.0 - 8.0 Final  . Glucose, UA 01/03/2012 250* NEGATIVE mg/dL Final  . Hgb urine dipstick 01/03/2012 NEGATIVE  NEGATIVE Final  . Bilirubin Urine 01/03/2012 NEGATIVE  NEGATIVE Final  . Ketones, ur 01/03/2012 NEGATIVE  NEGATIVE mg/dL Final  . Protein, ur 16/01/9603 NEGATIVE   NEGATIVE mg/dL Final  . Urobilinogen, UA 01/03/2012 0.2  0.0 - 1.0 mg/dL Final  . Nitrite 54/12/8117 NEGATIVE  NEGATIVE Final  . Leukocytes, UA 01/03/2012 NEGATIVE  NEGATIVE Final   MICROSCOPIC NOT DONE ON URINES WITH NEGATIVE PROTEIN, BLOOD, LEUKOCYTES, NITRITE, OR GLUCOSE <1000 mg/dL.  Marland Kitchen MRSA, PCR 01/03/2012 NEGATIVE  NEGATIVE Final  . Staphylococcus aureus 01/03/2012 NEGATIVE  NEGATIVE Final   Comment:                                 The Xpert SA Assay (FDA                          approved for NASAL specimens                          in patients over 102 years of age),                          is one component of                          a comprehensive surveillance                          program.  Test performance has                          been validated by [employer] [facility] for patients greater                          than or equal to 63 year old.                          It is not intended                          to diagnose infection nor to                          guide or monitor treatment.    Basename 01/19/12 0400 01/18/12 0414 01/17/12 0545 01/16/12 1825  HGB 9.1* 7.5* 9.6* 9.9*    Basename 01/19/12 0400 01/18/12 0414  WBC 7.8 8.8  RBC 3.06* 2.45*  HCT 25.7* 20.8*  PLT 160 141*    Basename 01/19/12 0400 01/17/12 0545  NA 132* 129*  K 4.2 4.5  CL 99 96  CO2 25 21  BUN 15 16  CREATININE 0.88 0.96  GLUCOSE 172* 275*  CALCIUM 9.1 8.9   No results found for this basename: LABPT:2,INR:2 in the last 72 hours  X-Rays:Dg Elbow 2 Views Left  01/17/2012  *RADIOLOGY REPORT*  Clinical Data: Post fall, now with pain and bruising  LEFT ELBOW - 2 VIEW  Comparison: None.  Findings: No definite fracture or elbow joint effusion.  Joint spaces are preserved.  Regional soft tissues are normal.  No radiopaque foreign body.  IMPRESSION: No fracture or elbow joint effusion.   Original Report Authenticated By: Waynard Reeds, M.D.    Dg Pelvis  Portable  01/17/2012  *RADIOLOGY REPORT*  Clinical Data: Post fall, now with left hip pain  PORTABLE PELVIS  Comparison: None.  Findings: Osteopenia without definite hip or pelvic fracture.  Mild degenerate change of the bilateral hips with joint space loss, subchondral sclerosis and minimal osteophytosis.  Degenerative change within the imaged lower lumbar spine is suspected but incompletely evaluated.  Regional soft tissues are normal.  No radiopaque foreign body.  IMPRESSION: Osteopenia without definite upper pelvic fracture.   Original Report Authenticated By: Waynard Reeds, M.D.    Dg Humerus Left  01/17/2012  *RADIOLOGY REPORT*  Clinical Data: Post fall, now with left arm pain.  LEFT HUMERUS - 2+ VIEW  Comparison: Left elbow radiographs of earlier same day  Findings: Apparent vertically oriented linear lucency involving the posterior lateral aspect of the humeral head may represent a nondisplaced fracture versus oblique projection of the greater tuberosity.  Minimal degenerative changes of the [facility] joint. Regional soft tissues are normal.  No radiopaque foreign body.  IMPRESSION: Possible nondisplaced vertically oriented fracture involving the posterior lateral aspect of the humeral head versus oblique projection of the greater tuberosity.  Further evaluation with dedicated left shoulder radiograph may be performed as clinically indicated.  This was made a call report.   Original Report Authenticated By: Waynard Reeds, M.D.     EKG: Orders placed in visit on 10/02/11  . EKG 12-LEAD     Hospital Course:  Steven [name] is a 78 y.o. who was admitted to [facility]. They were brought to the operating room on 01/15/2012 and underwent Procedure(s): TOTAL KNEE ARTHROPLASTY.  Patient tolerated the procedure well and was later transferred to the recovery room and then to the orthopaedic floor  for postoperative care.  They were given PO and IV analgesics for pain control following their surgery.   They were given 24 hours of postoperative antibiotics of  Anti-infectives     Start     Dose/Rate Route Frequency Ordered Stop   01/15/12 2030   ceFAZolin (ANCEF) IVPB 2 g/50 mL premix        2 g 100 mL/hr over 30 Minutes Intravenous Every 6 hours 01/15/12 1732 01/16/12 0235   01/15/12 1218   ceFAZolin (ANCEF) 3 g in dextrose 5 % 50 mL IVPB        3 g 160 mL/hr over 30 Minutes Intravenous 60 min pre-op 01/15/12 1218 01/15/12 1438         and started on DVT prophylaxis in the form of Xarelto.   PT and OT were ordered for total joint protocol.  Discharge planning consulted to help with postop disposition and equipment needs.  Patient had a decnt night on the evening of surgery and started to get up OOB with therapy on day one.  Patient was well, but has had some minor complaints of pain in the right great toe. He is concerned because he has a history of gout. He reports that once he had the Ambien last night he actually slept very well. He denies chest pain or shortness of breath. He started therapy on day one. Plan was to go Skilled nursing facility after hospital stay. Hemovac drain was pulled without difficulty. Was transfused 2 units of blood today. He had dropped significantly from postop Hgb. He resumed therapy after completion of transfusion. Also checking uric acid level as patient has a history of gout and is currently experiencing great toe pain. Uric Acid level was normal at 4.5.  Noted on day two, the patient had a fall last night but did not recall getting up out of bed. He was found in the floor but hospital staff with complaints of left upper extremity pain - forearm and shoulder. Upon exam today, he also had some left hip pain. Upon exam, he was tender at the proximal humerus but able to move the arm with pain only on abduction. The radiographs were equivocal for a proximal humeral nondisplaced fracture at the level of the greater tuberosity. Given his ability to move the arm well, and  lack of significant rest pain, it was felt that he probably has more of a bony contusion than an actual fracture. Treatment will involve mobilization to prevent stiffness, as even if it is a nondisplaced fracture it is stable and can be mobilized without limitations.  The x-rays of his pelvis and forearm were okay.  Continued to work with therapy into day two.  Dressing was changed on day two and the incision was healing okay but did have some small blisters at the distal end of the incision.  By day three, the patient had progressed with therapy a little more but was not ready for transfer at that time..  Incision was healing well.  Patient was seen in rounds on day four and was doing much better.  He was ready to go home to the SNF on day four.   Discharge Medications: Prior to Admission medications   Medication Sig Start Date End Date Taking? Authorizing Provider  atorvastatin (LIPITOR) 40 MG tablet Take 40 mg by mouth at bedtime.    Yes Historical Provider, MD  fluticasone (VERAMYST) 27.5 MCG/SPRAY nasal spray Place 2 sprays into both nostrils at bedtime.  09/15/10  Yes  Lindley Magnus, MD  glyBURIDE (DIABETA) 5 MG tablet TAKE 1 TABLET (5MG  TOTAL) BY MOUTH DAILY WITH BREAKFAST 01/11/12  Yes Madelin Headings, MD  metFORMIN (GLUCOPHAGE) 1000 MG tablet Take 1,000 mg by mouth 2 (two) times daily with a meal.   Yes Historical Provider, MD  metoprolol succinate (TOPROL-XL) 25 MG 24 hr tablet Take 25 mg by mouth at bedtime.    Yes Historical Provider, MD  olopatadine (PATANOL) 0.1 % ophthalmic solution Place 1 drop into both eyes 2 (two) times daily.     Yes Historical Provider, MD  omeprazole (PRILOSEC) 20 MG capsule Take 20 mg by mouth daily.   Yes Historical Provider, MD  ramipril (ALTACE) 10 MG capsule Take 10 mg by mouth daily.   Yes Historical Provider, MD  acetaminophen (TYLENOL) 325 MG tablet Take 2 tablets (650 mg total) by mouth every 6 (six) hours as needed (or Fever >/= 101). 01/19/12   [name]  [name], PA  acyclovir ointment (ZOVIRAX) 5 % Apply 1 application topically daily as needed. For outbreak    Historical Provider, MD  beta carotene w/minerals (OCUVITE) tablet Take 1 tablet by mouth daily.    Historical Provider, MD  bisacodyl (DULCOLAX) 10 MG suppository Place 1 suppository (10 mg total) rectally daily as needed. 01/19/12   [name] [name], PA  docusate sodium 100 MG CAPS Take 100 mg by mouth 2 (two) times daily. 01/19/12   [name] Julien Girt, PA  FreeStyle Unistick II Lancets MISC Use once daily as instructed.     Historical Provider, MD  glucose blood (FREESTYLE LITE) test strip Use once daily as instructed 07/25/11   Lindley Magnus, MD  iron polysaccharides (NIFEREX) 150 MG capsule Take 1 capsule (150 mg total) by mouth daily. 01/19/12   [name] [name], PA  ketoconazole (NIZORAL) 2 % shampoo Apply topically 2 (two) times a week. 08/15/11   Lindley Magnus, MD  methocarbamol (ROBAXIN) 500 MG tablet Take 1 tablet (500 mg total) by mouth every 6 (six) hours as needed. 01/19/12   [name] [name], PA  ondansetron (ZOFRAN) 4 MG tablet Take 1 tablet (4 mg total) by mouth every 6 (six) hours as needed for nausea. 01/19/12   [name] [name], PA  polyethylene glycol (MIRALAX / GLYCOLAX) packet Take 17 g by mouth daily as needed. 01/19/12   [name] Julien Girt, PA  rivaroxaban (XARELTO) 10 MG TABS tablet Take 1 tablet (10 mg total) by mouth daily with breakfast. Take Xarelto for two and a half more weeks, then discontinue Xarelto. 01/19/12   [name] Julien Girt, PA  traMADol (ULTRAM) 50 MG tablet Take 1-2 tablets (50-100 mg total) by mouth every 6 (six) hours as needed. 01/19/12   [name] Julien Girt, PA    Diet: Cardiac diet Activity:WBAT Follow-up:in 2 weeks Disposition - Skilled nursing facility - [facility] Discharged Condition: good   Discharge Orders    Future Orders Please Complete By Expires   Diet - low sodium heart healthy      Diet Carb Modified      Call MD /  Call 911      Comments:   If you experience chest pain or shortness of breath, CALL 911 and be transported to the hospital emergency room.  If you develope a fever above 101 F, pus (white drainage) or increased drainage or redness at the wound, or calf pain, call your surgeon's office.   Discharge instructions      Comments:   Pick up stool softner and laxative for home. Do  not submerge incision under water. May shower. Continue to use ice for pain and swelling from surgery.  Take Xarelto for two and a half more weeks, then discontinue Xarelto.  When discharged from the skilled rehab facility, please have the facility set up the patient's Home Health Physical Therapy prior to being released.  Also provide the patient with their medications at time of release from the facility to include their pain medication, the muscle relaxants, and their blood thinner medication.  If the patient is still at the rehab facility at time of follow up appointment, please also assist the patient in arranging follow up appointment in our office and any transportation needs.   Constipation Prevention      Comments:   Drink plenty of fluids.  Prune juice may be helpful.  You may use a stool softener, such as Colace (over the counter) 100 mg twice a day.  Use MiraLax (over the counter) for constipation as needed.   Increase activity slowly as tolerated      Patient may shower      Comments:   You may shower without a dressing once there is no drainage.  Do not wash over the wound.  If drainage remains, do not shower until drainage stops.   Driving restrictions      Comments:   No driving until released by the physician.   Lifting restrictions      Comments:   No lifting until released by the physician.   TED hose      Comments:   Use stockings (TED hose) for 3 weeks on both leg(s).  You may remove them at night for sleeping.   Change dressing      Comments:   Change dressing daily with sterile 4 x 4 inch  gauze dressing and apply TED hose. Do not submerge the incision under water.   Do not put a pillow under the knee. Place it under the heel.      Do not sit on low chairs, stoools or toilet seats, as it may be difficult to get up from low surfaces          Medication List     As of 01/19/2012  8:18 AM    STOP taking these medications         [facility] CINNAMON 500 MG capsule   Generic drug: Cinnamon      diclofenac 75 MG EC tablet   Commonly known as: VOLTAREN      fish oil-omega-3 fatty acids 1000 MG capsule      multivitamin tablet      TAKE these medications         acetaminophen 325 MG tablet   Commonly known as: TYLENOL   Take 2 tablets (650 mg total) by mouth every 6 (six) hours as needed (or Fever >/= 101).      acyclovir ointment 5 %   Commonly known as: ZOVIRAX   Apply 1 application topically daily as needed. For outbreak      atorvastatin 40 MG tablet   Commonly known as: LIPITOR   Take 40 mg by mouth at bedtime.      beta carotene w/minerals tablet   Take 1 tablet by mouth daily.      bisacodyl 10 MG suppository   Commonly known as: DULCOLAX   Place 1 suppository (10 mg total) rectally daily as needed.      DSS 100 MG Caps   Take 100 mg by mouth 2 (two)  times daily.      fluticasone 27.5 MCG/SPRAY nasal spray   Commonly known as: VERAMYST   Place 2 sprays into both nostrils at bedtime.      FreeStyle Unistick II Lancets Misc   Use once daily as instructed.      glucose blood test strip   Use once daily as instructed      glyBURIDE 5 MG tablet   Commonly known as: DIABETA   TAKE 1 TABLET (5MG  TOTAL) BY MOUTH DAILY WITH BREAKFAST      iron polysaccharides 150 MG capsule   Commonly known as: NIFEREX   Take 1 capsule (150 mg total) by mouth daily.      ketoconazole 2 % shampoo   Commonly known as: NIZORAL   Apply topically 2 (two) times a week.      metFORMIN 1000 MG tablet   Commonly known as: GLUCOPHAGE   Take 1,000 mg by mouth 2 (two) times  daily with a meal.      methocarbamol 500 MG tablet   Commonly known as: ROBAXIN   Take 1 tablet (500 mg total) by mouth every 6 (six) hours as needed.      metoprolol succinate 25 MG 24 hr tablet   Commonly known as: TOPROL-XL   Take 25 mg by mouth at bedtime.      olopatadine 0.1 % ophthalmic solution   Commonly known as: PATANOL   Place 1 drop into both eyes 2 (two) times daily.      omeprazole 20 MG capsule   Commonly known as: PRILOSEC   Take 20 mg by mouth daily.      ondansetron 4 MG tablet   Commonly known as: ZOFRAN   Take 1 tablet (4 mg total) by mouth every 6 (six) hours as needed for nausea.      polyethylene glycol packet   Commonly known as: MIRALAX / GLYCOLAX   Take 17 g by mouth daily as needed.      ramipril 10 MG capsule   Commonly known as: ALTACE   Take 10 mg by mouth daily.      rivaroxaban 10 MG Tabs tablet   Commonly known as: XARELTO   Take 1 tablet (10 mg total) by mouth daily with breakfast. Take Xarelto for two and a half more weeks, then discontinue Xarelto.      traMADol 50 MG tablet   Commonly known as: ULTRAM   Take 1-2 tablets (50-100 mg total) by mouth every 6 (six) hours as needed.           Follow-up Information    Follow up with Loanne Drilling, MD. Schedule an appointment as soon as possible for a visit in 2 weeks.   Contact information:   [facility] [address] [place] [address] [phone]          Signed: Patrica Duel 01/19/2012, 8:18 AM

## 2012-01-22 DIAGNOSIS — I2510 Atherosclerotic heart disease of native coronary artery without angina pectoris: Secondary | ICD-10-CM | POA: Diagnosis not present

## 2012-01-22 DIAGNOSIS — E7800 Pure hypercholesterolemia, unspecified: Secondary | ICD-10-CM | POA: Diagnosis not present

## 2012-01-22 DIAGNOSIS — E119 Type 2 diabetes mellitus without complications: Secondary | ICD-10-CM | POA: Diagnosis not present

## 2012-01-22 DIAGNOSIS — D649 Anemia, unspecified: Secondary | ICD-10-CM | POA: Diagnosis not present

## 2012-01-24 DIAGNOSIS — E119 Type 2 diabetes mellitus without complications: Secondary | ICD-10-CM | POA: Diagnosis not present

## 2012-01-24 DIAGNOSIS — E7800 Pure hypercholesterolemia, unspecified: Secondary | ICD-10-CM | POA: Diagnosis not present

## 2012-01-24 DIAGNOSIS — I2510 Atherosclerotic heart disease of native coronary artery without angina pectoris: Secondary | ICD-10-CM | POA: Diagnosis not present

## 2012-01-25 DIAGNOSIS — I2510 Atherosclerotic heart disease of native coronary artery without angina pectoris: Secondary | ICD-10-CM | POA: Diagnosis not present

## 2012-01-25 DIAGNOSIS — D649 Anemia, unspecified: Secondary | ICD-10-CM | POA: Diagnosis not present

## 2012-01-25 DIAGNOSIS — E119 Type 2 diabetes mellitus without complications: Secondary | ICD-10-CM | POA: Diagnosis not present

## 2012-01-25 DIAGNOSIS — K5900 Constipation, unspecified: Secondary | ICD-10-CM | POA: Diagnosis not present

## 2012-02-09 DIAGNOSIS — D649 Anemia, unspecified: Secondary | ICD-10-CM | POA: Diagnosis not present

## 2012-02-09 DIAGNOSIS — G4700 Insomnia, unspecified: Secondary | ICD-10-CM | POA: Diagnosis not present

## 2012-02-09 DIAGNOSIS — I2510 Atherosclerotic heart disease of native coronary artery without angina pectoris: Secondary | ICD-10-CM | POA: Diagnosis not present

## 2012-02-09 DIAGNOSIS — E119 Type 2 diabetes mellitus without complications: Secondary | ICD-10-CM | POA: Diagnosis not present

## 2012-02-09 DIAGNOSIS — E7800 Pure hypercholesterolemia, unspecified: Secondary | ICD-10-CM | POA: Diagnosis not present

## 2012-02-16 DIAGNOSIS — Z471 Aftercare following joint replacement surgery: Secondary | ICD-10-CM | POA: Diagnosis not present

## 2012-02-16 DIAGNOSIS — E119 Type 2 diabetes mellitus without complications: Secondary | ICD-10-CM | POA: Diagnosis not present

## 2012-02-16 DIAGNOSIS — I2510 Atherosclerotic heart disease of native coronary artery without angina pectoris: Secondary | ICD-10-CM | POA: Diagnosis not present

## 2012-02-16 DIAGNOSIS — M47817 Spondylosis without myelopathy or radiculopathy, lumbosacral region: Secondary | ICD-10-CM | POA: Diagnosis not present

## 2012-02-16 DIAGNOSIS — D649 Anemia, unspecified: Secondary | ICD-10-CM | POA: Diagnosis not present

## 2012-02-16 DIAGNOSIS — Z96659 Presence of unspecified artificial knee joint: Secondary | ICD-10-CM | POA: Diagnosis not present

## 2012-02-19 DIAGNOSIS — Z96659 Presence of unspecified artificial knee joint: Secondary | ICD-10-CM | POA: Diagnosis not present

## 2012-02-19 DIAGNOSIS — E119 Type 2 diabetes mellitus without complications: Secondary | ICD-10-CM | POA: Diagnosis not present

## 2012-02-19 DIAGNOSIS — Z471 Aftercare following joint replacement surgery: Secondary | ICD-10-CM | POA: Diagnosis not present

## 2012-02-19 DIAGNOSIS — I2510 Atherosclerotic heart disease of native coronary artery without angina pectoris: Secondary | ICD-10-CM | POA: Diagnosis not present

## 2012-02-19 DIAGNOSIS — D649 Anemia, unspecified: Secondary | ICD-10-CM | POA: Diagnosis not present

## 2012-02-20 DIAGNOSIS — Z471 Aftercare following joint replacement surgery: Secondary | ICD-10-CM | POA: Diagnosis not present

## 2012-02-20 DIAGNOSIS — D649 Anemia, unspecified: Secondary | ICD-10-CM | POA: Diagnosis not present

## 2012-02-20 DIAGNOSIS — Z96659 Presence of unspecified artificial knee joint: Secondary | ICD-10-CM | POA: Diagnosis not present

## 2012-02-20 DIAGNOSIS — I2510 Atherosclerotic heart disease of native coronary artery without angina pectoris: Secondary | ICD-10-CM | POA: Diagnosis not present

## 2012-02-20 DIAGNOSIS — E119 Type 2 diabetes mellitus without complications: Secondary | ICD-10-CM | POA: Diagnosis not present

## 2012-02-21 DIAGNOSIS — I2510 Atherosclerotic heart disease of native coronary artery without angina pectoris: Secondary | ICD-10-CM | POA: Diagnosis not present

## 2012-02-21 DIAGNOSIS — Z96659 Presence of unspecified artificial knee joint: Secondary | ICD-10-CM | POA: Diagnosis not present

## 2012-02-21 DIAGNOSIS — Z471 Aftercare following joint replacement surgery: Secondary | ICD-10-CM | POA: Diagnosis not present

## 2012-02-21 DIAGNOSIS — E119 Type 2 diabetes mellitus without complications: Secondary | ICD-10-CM | POA: Diagnosis not present

## 2012-02-21 DIAGNOSIS — D649 Anemia, unspecified: Secondary | ICD-10-CM | POA: Diagnosis not present

## 2012-02-22 DIAGNOSIS — Z96659 Presence of unspecified artificial knee joint: Secondary | ICD-10-CM | POA: Diagnosis not present

## 2012-02-23 DIAGNOSIS — I2510 Atherosclerotic heart disease of native coronary artery without angina pectoris: Secondary | ICD-10-CM | POA: Diagnosis not present

## 2012-02-23 DIAGNOSIS — Z96659 Presence of unspecified artificial knee joint: Secondary | ICD-10-CM | POA: Diagnosis not present

## 2012-02-23 DIAGNOSIS — D649 Anemia, unspecified: Secondary | ICD-10-CM | POA: Diagnosis not present

## 2012-02-23 DIAGNOSIS — E119 Type 2 diabetes mellitus without complications: Secondary | ICD-10-CM | POA: Diagnosis not present

## 2012-02-23 DIAGNOSIS — Z471 Aftercare following joint replacement surgery: Secondary | ICD-10-CM | POA: Diagnosis not present

## 2012-02-26 DIAGNOSIS — Z471 Aftercare following joint replacement surgery: Secondary | ICD-10-CM | POA: Diagnosis not present

## 2012-02-26 DIAGNOSIS — D649 Anemia, unspecified: Secondary | ICD-10-CM | POA: Diagnosis not present

## 2012-02-26 DIAGNOSIS — I2510 Atherosclerotic heart disease of native coronary artery without angina pectoris: Secondary | ICD-10-CM | POA: Diagnosis not present

## 2012-02-26 DIAGNOSIS — E119 Type 2 diabetes mellitus without complications: Secondary | ICD-10-CM | POA: Diagnosis not present

## 2012-02-26 DIAGNOSIS — Z96659 Presence of unspecified artificial knee joint: Secondary | ICD-10-CM | POA: Diagnosis not present

## 2012-02-28 DIAGNOSIS — D649 Anemia, unspecified: Secondary | ICD-10-CM | POA: Diagnosis not present

## 2012-02-28 DIAGNOSIS — I2510 Atherosclerotic heart disease of native coronary artery without angina pectoris: Secondary | ICD-10-CM | POA: Diagnosis not present

## 2012-02-28 DIAGNOSIS — Z471 Aftercare following joint replacement surgery: Secondary | ICD-10-CM | POA: Diagnosis not present

## 2012-02-28 DIAGNOSIS — E119 Type 2 diabetes mellitus without complications: Secondary | ICD-10-CM | POA: Diagnosis not present

## 2012-02-28 DIAGNOSIS — Z96659 Presence of unspecified artificial knee joint: Secondary | ICD-10-CM | POA: Diagnosis not present

## 2012-03-01 DIAGNOSIS — Z471 Aftercare following joint replacement surgery: Secondary | ICD-10-CM | POA: Diagnosis not present

## 2012-03-01 DIAGNOSIS — I2510 Atherosclerotic heart disease of native coronary artery without angina pectoris: Secondary | ICD-10-CM | POA: Diagnosis not present

## 2012-03-01 DIAGNOSIS — Z96659 Presence of unspecified artificial knee joint: Secondary | ICD-10-CM | POA: Diagnosis not present

## 2012-03-01 DIAGNOSIS — D649 Anemia, unspecified: Secondary | ICD-10-CM | POA: Diagnosis not present

## 2012-03-01 DIAGNOSIS — E119 Type 2 diabetes mellitus without complications: Secondary | ICD-10-CM | POA: Diagnosis not present

## 2012-03-04 DIAGNOSIS — M545 Low back pain: Secondary | ICD-10-CM | POA: Diagnosis not present

## 2012-03-05 DIAGNOSIS — Z96659 Presence of unspecified artificial knee joint: Secondary | ICD-10-CM | POA: Diagnosis not present

## 2012-03-05 DIAGNOSIS — D649 Anemia, unspecified: Secondary | ICD-10-CM | POA: Diagnosis not present

## 2012-03-05 DIAGNOSIS — G622 Polyneuropathy due to other toxic agents: Secondary | ICD-10-CM | POA: Diagnosis not present

## 2012-03-05 DIAGNOSIS — Z471 Aftercare following joint replacement surgery: Secondary | ICD-10-CM | POA: Diagnosis not present

## 2012-03-05 DIAGNOSIS — I2510 Atherosclerotic heart disease of native coronary artery without angina pectoris: Secondary | ICD-10-CM | POA: Diagnosis not present

## 2012-03-05 DIAGNOSIS — E119 Type 2 diabetes mellitus without complications: Secondary | ICD-10-CM | POA: Diagnosis not present

## 2012-03-05 DIAGNOSIS — G619 Inflammatory polyneuropathy, unspecified: Secondary | ICD-10-CM | POA: Diagnosis not present

## 2012-03-06 DIAGNOSIS — Z471 Aftercare following joint replacement surgery: Secondary | ICD-10-CM | POA: Diagnosis not present

## 2012-03-06 DIAGNOSIS — E119 Type 2 diabetes mellitus without complications: Secondary | ICD-10-CM | POA: Diagnosis not present

## 2012-03-06 DIAGNOSIS — I2510 Atherosclerotic heart disease of native coronary artery without angina pectoris: Secondary | ICD-10-CM | POA: Diagnosis not present

## 2012-03-06 DIAGNOSIS — Z96659 Presence of unspecified artificial knee joint: Secondary | ICD-10-CM | POA: Diagnosis not present

## 2012-03-06 DIAGNOSIS — D649 Anemia, unspecified: Secondary | ICD-10-CM | POA: Diagnosis not present

## 2012-03-08 DIAGNOSIS — Z96659 Presence of unspecified artificial knee joint: Secondary | ICD-10-CM | POA: Diagnosis not present

## 2012-03-08 DIAGNOSIS — E119 Type 2 diabetes mellitus without complications: Secondary | ICD-10-CM | POA: Diagnosis not present

## 2012-03-08 DIAGNOSIS — D649 Anemia, unspecified: Secondary | ICD-10-CM | POA: Diagnosis not present

## 2012-03-08 DIAGNOSIS — I2510 Atherosclerotic heart disease of native coronary artery without angina pectoris: Secondary | ICD-10-CM | POA: Diagnosis not present

## 2012-03-08 DIAGNOSIS — Z471 Aftercare following joint replacement surgery: Secondary | ICD-10-CM | POA: Diagnosis not present

## 2012-03-11 DIAGNOSIS — M1710 Unilateral primary osteoarthritis, unspecified knee: Secondary | ICD-10-CM | POA: Diagnosis not present

## 2012-03-19 DIAGNOSIS — M1710 Unilateral primary osteoarthritis, unspecified knee: Secondary | ICD-10-CM | POA: Diagnosis not present

## 2012-03-20 DIAGNOSIS — J342 Deviated nasal septum: Secondary | ICD-10-CM | POA: Diagnosis not present

## 2012-03-20 DIAGNOSIS — J310 Chronic rhinitis: Secondary | ICD-10-CM | POA: Diagnosis not present

## 2012-03-20 DIAGNOSIS — H6120 Impacted cerumen, unspecified ear: Secondary | ICD-10-CM | POA: Diagnosis not present

## 2012-03-21 ENCOUNTER — Telehealth: Payer: Self-pay | Admitting: *Deleted

## 2012-03-21 DIAGNOSIS — Z96659 Presence of unspecified artificial knee joint: Secondary | ICD-10-CM | POA: Diagnosis not present

## 2012-03-21 NOTE — Telephone Encounter (Signed)
Pt called wanting to know if he should be taking the omeprazole 20 mg because he heard it causes softening of the bone.  Per Dr Cato Mulligan he does not need to worry about that, keep taking the omeprazole.  Pt aware

## 2012-03-22 DIAGNOSIS — M1710 Unilateral primary osteoarthritis, unspecified knee: Secondary | ICD-10-CM | POA: Diagnosis not present

## 2012-03-26 DIAGNOSIS — M1710 Unilateral primary osteoarthritis, unspecified knee: Secondary | ICD-10-CM | POA: Diagnosis not present

## 2012-04-01 DIAGNOSIS — M1710 Unilateral primary osteoarthritis, unspecified knee: Secondary | ICD-10-CM | POA: Diagnosis not present

## 2012-04-04 DIAGNOSIS — M47817 Spondylosis without myelopathy or radiculopathy, lumbosacral region: Secondary | ICD-10-CM | POA: Diagnosis not present

## 2012-04-04 DIAGNOSIS — S42293A Other displaced fracture of upper end of unspecified humerus, initial encounter for closed fracture: Secondary | ICD-10-CM | POA: Diagnosis not present

## 2012-04-06 DIAGNOSIS — Z23 Encounter for immunization: Secondary | ICD-10-CM | POA: Diagnosis not present

## 2012-04-12 DIAGNOSIS — S42293A Other displaced fracture of upper end of unspecified humerus, initial encounter for closed fracture: Secondary | ICD-10-CM | POA: Diagnosis not present

## 2012-04-15 DIAGNOSIS — S42293A Other displaced fracture of upper end of unspecified humerus, initial encounter for closed fracture: Secondary | ICD-10-CM | POA: Diagnosis not present

## 2012-04-18 ENCOUNTER — Other Ambulatory Visit: Payer: Self-pay | Admitting: Internal Medicine

## 2012-04-18 DIAGNOSIS — M47817 Spondylosis without myelopathy or radiculopathy, lumbosacral region: Secondary | ICD-10-CM | POA: Diagnosis not present

## 2012-04-18 DIAGNOSIS — S42293A Other displaced fracture of upper end of unspecified humerus, initial encounter for closed fracture: Secondary | ICD-10-CM | POA: Diagnosis not present

## 2012-04-23 DIAGNOSIS — S42293A Other displaced fracture of upper end of unspecified humerus, initial encounter for closed fracture: Secondary | ICD-10-CM | POA: Diagnosis not present

## 2012-04-26 DIAGNOSIS — S42293A Other displaced fracture of upper end of unspecified humerus, initial encounter for closed fracture: Secondary | ICD-10-CM | POA: Diagnosis not present

## 2012-05-01 DIAGNOSIS — S42293A Other displaced fracture of upper end of unspecified humerus, initial encounter for closed fracture: Secondary | ICD-10-CM | POA: Diagnosis not present

## 2012-05-03 DIAGNOSIS — S42293A Other displaced fracture of upper end of unspecified humerus, initial encounter for closed fracture: Secondary | ICD-10-CM | POA: Diagnosis not present

## 2012-05-03 DIAGNOSIS — M47817 Spondylosis without myelopathy or radiculopathy, lumbosacral region: Secondary | ICD-10-CM | POA: Diagnosis not present

## 2012-05-07 DIAGNOSIS — S42293A Other displaced fracture of upper end of unspecified humerus, initial encounter for closed fracture: Secondary | ICD-10-CM | POA: Diagnosis not present

## 2012-05-08 DIAGNOSIS — G622 Polyneuropathy due to other toxic agents: Secondary | ICD-10-CM | POA: Diagnosis not present

## 2012-05-08 DIAGNOSIS — G619 Inflammatory polyneuropathy, unspecified: Secondary | ICD-10-CM | POA: Diagnosis not present

## 2012-05-10 DIAGNOSIS — M47817 Spondylosis without myelopathy or radiculopathy, lumbosacral region: Secondary | ICD-10-CM | POA: Diagnosis not present

## 2012-05-10 DIAGNOSIS — S42293A Other displaced fracture of upper end of unspecified humerus, initial encounter for closed fracture: Secondary | ICD-10-CM | POA: Diagnosis not present

## 2012-05-17 DIAGNOSIS — S42293A Other displaced fracture of upper end of unspecified humerus, initial encounter for closed fracture: Secondary | ICD-10-CM | POA: Diagnosis not present

## 2012-05-21 DIAGNOSIS — S42293A Other displaced fracture of upper end of unspecified humerus, initial encounter for closed fracture: Secondary | ICD-10-CM | POA: Diagnosis not present

## 2012-05-23 DIAGNOSIS — S42293A Other displaced fracture of upper end of unspecified humerus, initial encounter for closed fracture: Secondary | ICD-10-CM | POA: Diagnosis not present

## 2012-05-23 DIAGNOSIS — M47817 Spondylosis without myelopathy or radiculopathy, lumbosacral region: Secondary | ICD-10-CM | POA: Diagnosis not present

## 2012-05-30 ENCOUNTER — Other Ambulatory Visit: Payer: Self-pay | Admitting: Internal Medicine

## 2012-06-03 ENCOUNTER — Other Ambulatory Visit: Payer: Self-pay | Admitting: Internal Medicine

## 2012-06-03 DIAGNOSIS — M47817 Spondylosis without myelopathy or radiculopathy, lumbosacral region: Secondary | ICD-10-CM | POA: Diagnosis not present

## 2012-06-03 DIAGNOSIS — S42293A Other displaced fracture of upper end of unspecified humerus, initial encounter for closed fracture: Secondary | ICD-10-CM | POA: Diagnosis not present

## 2012-06-05 DIAGNOSIS — M47817 Spondylosis without myelopathy or radiculopathy, lumbosacral region: Secondary | ICD-10-CM | POA: Diagnosis not present

## 2012-06-10 DIAGNOSIS — S42293A Other displaced fracture of upper end of unspecified humerus, initial encounter for closed fracture: Secondary | ICD-10-CM | POA: Diagnosis not present

## 2012-06-10 DIAGNOSIS — M47817 Spondylosis without myelopathy or radiculopathy, lumbosacral region: Secondary | ICD-10-CM | POA: Diagnosis not present

## 2012-06-11 ENCOUNTER — Other Ambulatory Visit: Payer: Self-pay | Admitting: *Deleted

## 2012-06-11 MED ORDER — ATORVASTATIN CALCIUM 40 MG PO TABS: 40.0000 mg | ORAL_TABLET | Freq: Every day | ORAL | Status: DC

## 2012-06-11 MED ORDER — METFORMIN HCL 1000 MG PO TABS: 1000.0000 mg | ORAL_TABLET | Freq: Two times a day (BID) | ORAL | Status: DC

## 2012-06-11 MED ORDER — GLYBURIDE 5 MG PO TABS: ORAL_TABLET | ORAL | Status: DC

## 2012-06-13 DIAGNOSIS — M1710 Unilateral primary osteoarthritis, unspecified knee: Secondary | ICD-10-CM | POA: Diagnosis not present

## 2012-06-13 IMAGING — CR DG CHEST 1V
1 series · 1 of 1 positions shown · non-contrast
Comparison: 10/03/2010

CLINICAL DATA: Right lung nodule.

CHEST - 1 VIEW

[view not recorded]
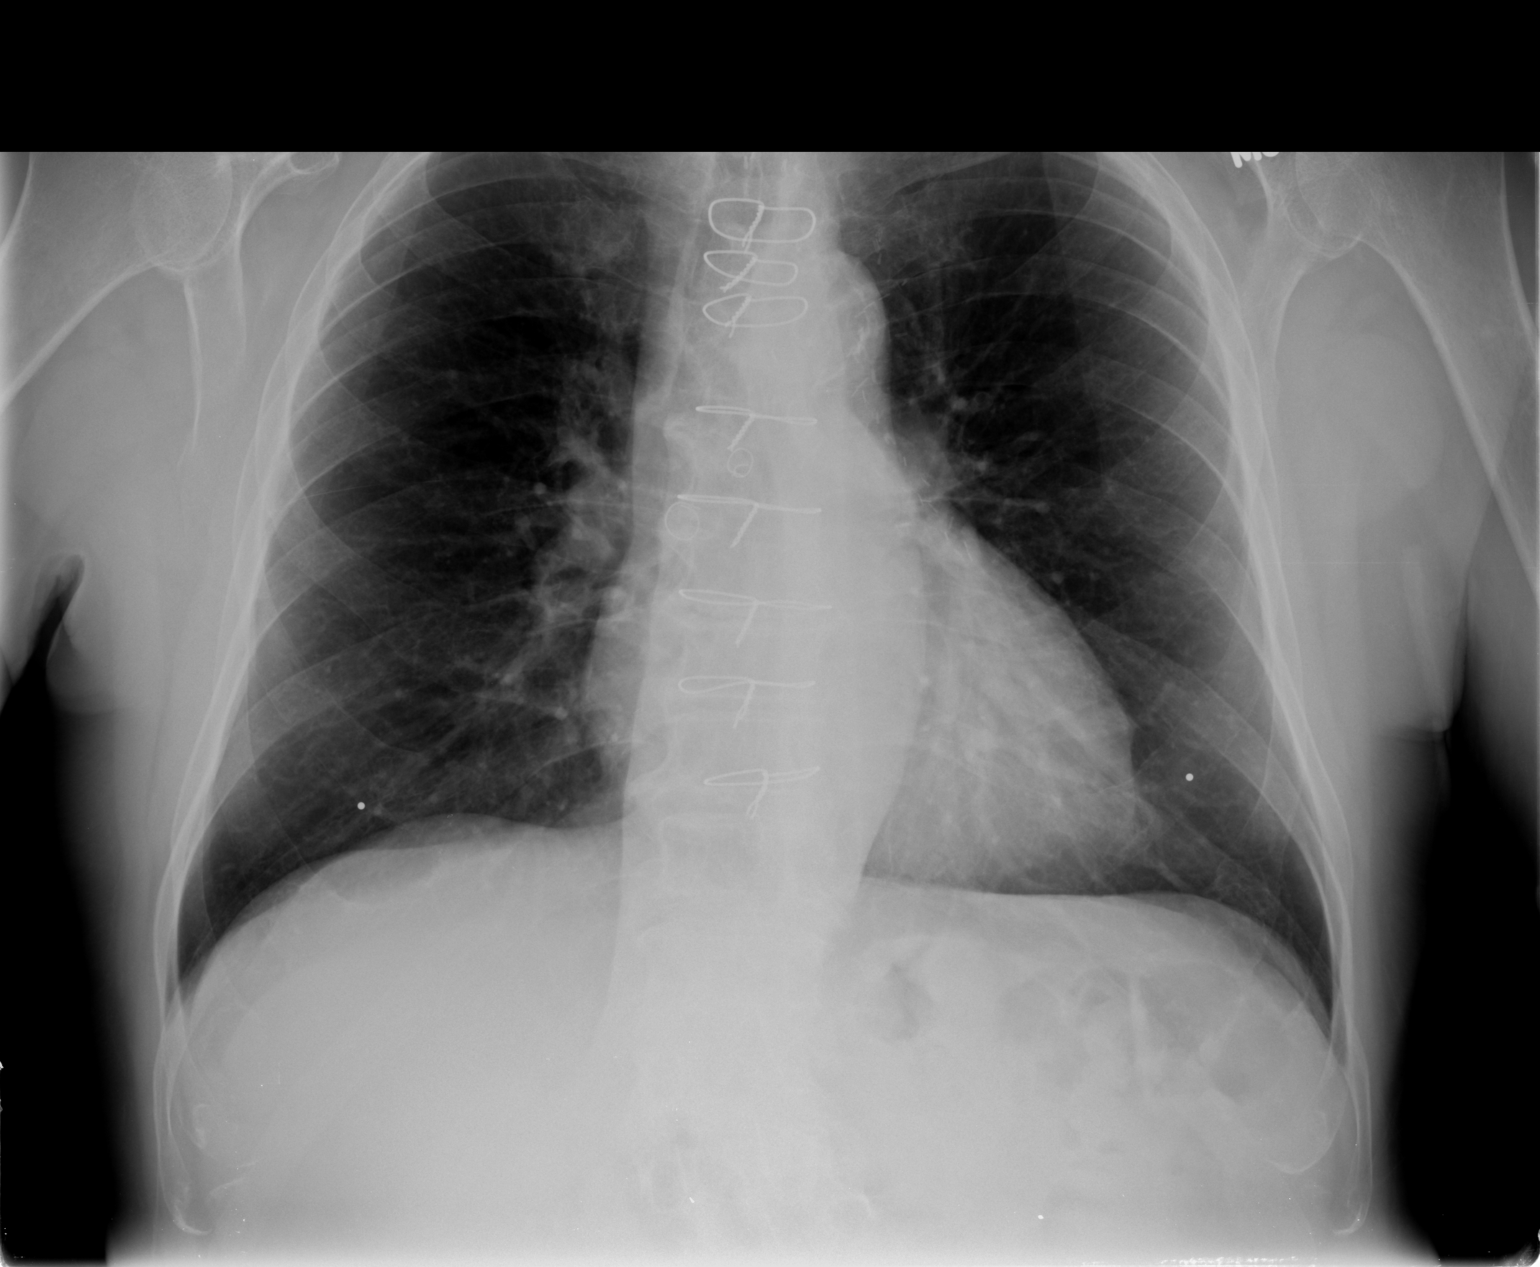

[1 of 1 positions shown; findings below may reference images not displayed]

FINDINGS: Today's chest radiograph with nipple markers confirms
that the nodular density seen at the right lung base on prior exam
does correspond to interval shadow.  Both lungs are clear.  The
heart size is normal.  Prior CABG again noted.
IMPRESSION: No active disease.

## 2012-06-24 ENCOUNTER — Encounter: Payer: Self-pay | Admitting: Internal Medicine

## 2012-06-24 DIAGNOSIS — S42293A Other displaced fracture of upper end of unspecified humerus, initial encounter for closed fracture: Secondary | ICD-10-CM | POA: Diagnosis not present

## 2012-06-24 DIAGNOSIS — M479 Spondylosis, unspecified: Secondary | ICD-10-CM | POA: Diagnosis not present

## 2012-06-24 NOTE — Progress Notes (Signed)
patient comes in for followup of multiple medical problems including type 2 diabetes, hyperlipidemia, hypertension. The patient does not check blood sugar or blood pressure at home. The patetient does not follow an exercise or diet program. The patient denies any polyuria, polydipsia.  In the past the patient has gone to diabetic treatment center. The patient is tolerating medications  Without difficulty. The patient does admit to medication compliance.   In addition he has had recent back and knee surgery- due for second knee surgery (left)  He is left with a left foot drop  Past Medical History  Diagnosis Date  . Diabetes mellitus     type II  . GERD (gastroesophageal reflux disease)   . Hyperlipidemia   . Anemia   . History of myocardial infarction   . Pneumonia     hx of  . Myocardial infarction 01-03-12    '04  . CAD (coronary artery disease) 2004    s/p inferior wall Mi 2004 with PTCA/Stent; s/p CABG 2004  . Arthritis 01-03-12    osteoarthritis-knees  . Neuropathy 01-03-12    bil. feet  . MYOCARDIAL INFARCTION, HX OF 04/19/2007    Qualifier: Diagnosis of  By: Everett Graff      History   Social History  . Marital Status: Married    Spouse Name: N/A    Number of Children: N/A  . Years of Education: N/A   Occupational History  . Not on file.   Social History Main Topics  . Smoking status: Former Smoker    Types: Pipe  . Smokeless tobacco: Not on file  . Alcohol Use: No  . Drug Use: No  . Sexually Active: Not Currently   Other Topics Concern  . Not on file   Social History Narrative   Regular exercise: no   Daily Caffeine: 3 cups coffee daily    Past Surgical History  Procedure Laterality Date  . Coronary artery bypass graft  2004    LIMA to LAD; SVG to ramus intermedius; SVG to PDA/PLSA  . Tonsillectomy    . Colonoscopy    . Laminectomy  09/08/2011    Procedure: LUMBAR LAMINECTOMY FOR TUMOR;  Surgeon: Maeola Harman, MD;  Location: [facility];  Service:  Neurosurgery;  Laterality: Left;  Left Thoracic twelve-Lumbar one transpedicular resection of epidural mass  . Cardiac catheterization  01-03-12    '04  . Total knee arthroplasty  01/15/2012    Procedure: TOTAL KNEE ARTHROPLASTY;  Surgeon: Loanne Drilling, MD;  Location: [facility];  Service: Orthopedics;  Laterality: Right;    Family History  Problem Relation Age of Onset  . Cirrhosis Mother     etiology unclear  . Hypertension Father   . Stroke Father   . Cancer Sister     breast  . Stroke Maternal Grandmother   . Heart disease Maternal Grandfather     MI  . Stroke Paternal Grandmother   . Stroke Paternal Grandfather   . Anesthesia problems Neg Hx   . Hypotension Neg Hx   . Malignant hyperthermia Neg Hx   . Pseudochol deficiency Neg Hx     No Known Allergies  Current Outpatient Prescriptions on File Prior to Visit  Medication Sig Dispense Refill  . acyclovir ointment (ZOVIRAX) 5 % Apply 1 application topically daily as needed. For outbreak      . atorvastatin (LIPITOR) 40 MG tablet Take 1 tablet (40 mg total) by mouth at bedtime.  90 tablet  0  .  beta carotene w/minerals (OCUVITE) tablet Take 1 tablet by mouth daily.      . bisacodyl (DULCOLAX) 10 MG suppository Place 1 suppository (10 mg total) rectally daily as needed.  12 suppository  0  . [employer] IRON 325 (65 FE) MG tablet TAKE 1 TABLET BY MOUTH TWO TIMES A DAY  60 tablet  2  . diclofenac (VOLTAREN) 75 MG EC tablet TAKE 1 TABLET (75 MG TOTAL) BY MOUTH 2 TIMES A DAY  180 tablet  0  . FreeStyle Unistick II Lancets MISC Use once daily as instructed.       Marland Kitchen glucose blood (FREESTYLE LITE) test strip Use once daily as instructed  100 each  11  . glyBURIDE (DIABETA) 5 MG tablet TAKE 1 TABLET (5MG  TOTAL) BY MOUTH DAILY WITH BREAKFAST  90 tablet  0  . iron polysaccharides (NIFEREX) 150 MG capsule Take 1 capsule (150 mg total) by mouth daily.  21 capsule  0  . ketoconazole (NIZORAL) 2 % shampoo Apply topically 2 (two) times a week.  120 mL   3  . metFORMIN (GLUCOPHAGE) 1000 MG tablet Take 1 tablet (1,000 mg total) by mouth 2 (two) times daily with a meal.  180 tablet  0  . metoprolol succinate (TOPROL-XL) 25 MG 24 hr tablet TAKE 1 TABLET (25MG  TOTAL) BY MOUTH DAILY  90 tablet  0  . olopatadine (PATANOL) 0.1 % ophthalmic solution Place 1 drop into both eyes 2 (two) times daily.        Marland Kitchen omeprazole (PRILOSEC) 20 MG capsule TAKE 1 CAPSULE (20MG  TOTAL) BY MOUTH TWICE DAILY  180 capsule  0  . ramipril (ALTACE) 10 MG capsule TAKE 1 CAPSULE (10 MG TOTAL) BY MOUTH DAILY  90 capsule  0  . rivaroxaban (XARELTO) 10 MG TABS tablet Take 1 tablet (10 mg total) by mouth daily with breakfast. Take Xarelto for two and a half more weeks, then discontinue Xarelto.  18 tablet  0  . VERAMYST 27.5 MCG/SPRAY nasal spray USE 2 SPRAYS INTO EACH NOSTRIL DAILY AS DIRECTED  10 g  5  . [DISCONTINUED] testosterone cypionate (DEPO-TESTOSTERONE) 200 MG/ML injection Inject 1 mL (200 mg total) into the muscle every 30 (thirty) days.  10 mL  0   No current facility-administered medications on file prior to visit.     patient denies chest pain, shortness of breath, orthopnea. Denies lower extremity edema, abdominal pain, change in appetite, change in bowel movements. Patient denies rashes, musculoskeletal complaints. No other specific complaints in a complete review of systems.   BP 142/72  Pulse 64  Temp(Src) 98.1 F (36.7 C) (Oral)  Wt 167 lb (75.751 kg)  BMI 24.65 kg/m2  well-developed well-nourished male in no acute distress. HEENT exam atraumatic, normocephalic, neck supple without jugular venous distention. Chest clear to auscultation cardiac exam S1-S2 are regular. Abdominal exam overweight with bowel sounds, soft and nontender. Extremities no edema. Neurologic exam is alert with a normal gait.

## 2012-06-24 NOTE — Assessment & Plan Note (Signed)
Lab Results  Component Value Date   HGBA1C 6.4* 09/08/2011   Will check labs

## 2012-06-24 NOTE — Assessment & Plan Note (Signed)
No sxs  Check labs (cbc) today

## 2012-06-24 NOTE — Assessment & Plan Note (Signed)
Lipid Panel     Component Value Date/Time   CHOL 144 08/22/2011 0931   TRIG 97.0 08/22/2011 0931   HDL 47.30 08/22/2011 0931   CHOLHDL 3 08/22/2011 0931   VLDL 19.4 08/22/2011 0931   LDLCALC 77 08/22/2011 0931   Previously controlled Check labs today

## 2012-06-25 ENCOUNTER — Encounter: Payer: Self-pay | Admitting: Internal Medicine

## 2012-06-25 ENCOUNTER — Ambulatory Visit (INDEPENDENT_AMBULATORY_CARE_PROVIDER_SITE_OTHER): Payer: Medicare Other | Admitting: Internal Medicine

## 2012-06-25 VITALS — BP 142/72 | HR 64 | Temp 98.1°F | Wt 167.0 lb

## 2012-06-25 DIAGNOSIS — E785 Hyperlipidemia, unspecified: Secondary | ICD-10-CM

## 2012-06-25 DIAGNOSIS — E119 Type 2 diabetes mellitus without complications: Secondary | ICD-10-CM

## 2012-06-25 DIAGNOSIS — M5416 Radiculopathy, lumbar region: Secondary | ICD-10-CM

## 2012-06-25 DIAGNOSIS — K219 Gastro-esophageal reflux disease without esophagitis: Secondary | ICD-10-CM

## 2012-06-25 DIAGNOSIS — IMO0002 Reserved for concepts with insufficient information to code with codable children: Secondary | ICD-10-CM | POA: Diagnosis not present

## 2012-06-25 LAB — LIPID PANEL
HDL: 33.8 mg/dL — ABNORMAL LOW (ref >39.00)
Triglycerides: 89 mg/dL (ref 0.0–149.0)
VLDL: 17.8 mg/dL (ref 0.0–40.0)

## 2012-06-25 LAB — BASIC METABOLIC PANEL
CO2: 26 mEq/L (ref 19–32)
Calcium: 9.5 mg/dL (ref 8.4–10.5)
Creatinine, Ser: 1.1 mg/dL (ref 0.4–1.5)
Glucose, Bld: 126 mg/dL — ABNORMAL HIGH (ref 70–99)

## 2012-06-25 LAB — CBC WITH DIFFERENTIAL/PLATELET
Basophils Absolute: 0 10*3/uL (ref 0.0–0.1)
Basophils Relative: 0.5 % (ref 0.0–3.0)
Hemoglobin: 11.2 g/dL — ABNORMAL LOW (ref 13.0–17.0)
Lymphocytes Relative: 31.7 % (ref 12.0–46.0)
Monocytes Relative: 9.1 % (ref 3.0–12.0)
Neutro Abs: 3.4 10*3/uL (ref 1.4–7.7)
RBC: 3.56 Mil/uL — ABNORMAL LOW (ref 4.22–5.81)
RDW: 13.9 % (ref 11.5–14.6)

## 2012-06-25 LAB — HEPATIC FUNCTION PANEL
Albumin: 3.9 g/dL (ref 3.5–5.2)
Total Protein: 6.6 g/dL (ref 6.0–8.3)

## 2012-06-25 LAB — HEMOGLOBIN A1C: Hgb A1c MFr Bld: 6.3 % (ref 4.6–6.5)

## 2012-06-25 NOTE — Assessment & Plan Note (Signed)
S/p surgery 

## 2012-06-27 ENCOUNTER — Telehealth: Payer: Self-pay | Admitting: Internal Medicine

## 2012-06-27 ENCOUNTER — Other Ambulatory Visit: Payer: Self-pay | Admitting: *Deleted

## 2012-06-27 DIAGNOSIS — I6523 Occlusion and stenosis of bilateral carotid arteries: Secondary | ICD-10-CM

## 2012-06-27 NOTE — Telephone Encounter (Signed)
New problem     Would like to be set up for carotid doppler.

## 2012-06-27 NOTE — Telephone Encounter (Signed)
Follow-up:    Patient returned your call.  Please call back before 3:00pm.

## 2012-06-27 NOTE — Telephone Encounter (Signed)
Pt called to request he be scheduled for a carotid doppler.  Last Carotid Doppler was 11/2010 at which time it was reccommended that it be repeated in 1 year.

## 2012-06-27 NOTE — Telephone Encounter (Signed)
Ordered and scheduled

## 2012-06-28 ENCOUNTER — Encounter: Payer: Medicare Other | Admitting: Internal Medicine

## 2012-07-01 ENCOUNTER — Telehealth: Payer: Self-pay | Admitting: Internal Medicine

## 2012-07-01 NOTE — Telephone Encounter (Signed)
Pt would like to know if he should he continue to take:  diclofenac (VOLTAREN) 75 MG EC tablet Fish oil omeprazole (PRILOSEC) 20 MG capsule (heard may be heart attack possibilities) General vitamins from [facility] for men over 50 Pt is having knee surgery in June. Pls advise.

## 2012-07-01 NOTE — Telephone Encounter (Signed)
Stop voltare

## 2012-07-03 NOTE — Telephone Encounter (Signed)
Pt aware.

## 2012-07-11 DIAGNOSIS — S42293A Other displaced fracture of upper end of unspecified humerus, initial encounter for closed fracture: Secondary | ICD-10-CM | POA: Diagnosis not present

## 2012-07-11 DIAGNOSIS — M5137 Other intervertebral disc degeneration, lumbosacral region: Secondary | ICD-10-CM | POA: Diagnosis not present

## 2012-07-19 ENCOUNTER — Encounter (INDEPENDENT_AMBULATORY_CARE_PROVIDER_SITE_OTHER): Payer: Medicare Other

## 2012-07-19 DIAGNOSIS — I6529 Occlusion and stenosis of unspecified carotid artery: Secondary | ICD-10-CM

## 2012-07-19 DIAGNOSIS — I6523 Occlusion and stenosis of bilateral carotid arteries: Secondary | ICD-10-CM

## 2012-08-07 ENCOUNTER — Telehealth: Payer: Self-pay | Admitting: *Deleted

## 2012-08-07 DIAGNOSIS — G622 Polyneuropathy due to other toxic agents: Secondary | ICD-10-CM | POA: Diagnosis not present

## 2012-08-07 DIAGNOSIS — G619 Inflammatory polyneuropathy, unspecified: Secondary | ICD-10-CM | POA: Diagnosis not present

## 2012-08-07 NOTE — Telephone Encounter (Signed)
Pt calls with multiple questions.  1st pt would like to know if he is cleared for his TKA, he was seen 06/25/12. 2nd he was looking at his labs and noticed his gluclose was 127 so he wants to know if he should increase his glyburide to 10 mg (he does not check his sugars on a daily basis).  Should he be taking crestor since his HDL is low and why was a PSA not checked?  Please advise

## 2012-08-08 NOTE — Telephone Encounter (Signed)
Pt aware.

## 2012-08-08 NOTE — Telephone Encounter (Signed)
Left message for pt to call back  °

## 2012-08-08 NOTE — Telephone Encounter (Signed)
He is cleared for surgery,  Stay on same dose of glyburide,  Stay on lipitor, not crestor psa not necessary at 79 yo

## 2012-08-23 ENCOUNTER — Ambulatory Visit (INDEPENDENT_AMBULATORY_CARE_PROVIDER_SITE_OTHER): Payer: Medicare Other | Admitting: Neurology

## 2012-08-23 ENCOUNTER — Encounter: Payer: Self-pay | Admitting: Neurology

## 2012-08-23 VITALS — BP 108/61 | HR 76 | Ht 65.0 in | Wt 170.0 lb

## 2012-08-23 DIAGNOSIS — R269 Unspecified abnormalities of gait and mobility: Secondary | ICD-10-CM

## 2012-08-23 DIAGNOSIS — M216X9 Other acquired deformities of unspecified foot: Secondary | ICD-10-CM | POA: Diagnosis not present

## 2012-08-23 DIAGNOSIS — D518 Other vitamin B12 deficiency anemias: Secondary | ICD-10-CM

## 2012-08-23 DIAGNOSIS — E1142 Type 2 diabetes mellitus with diabetic polyneuropathy: Secondary | ICD-10-CM | POA: Diagnosis not present

## 2012-08-23 DIAGNOSIS — R6889 Other general symptoms and signs: Secondary | ICD-10-CM | POA: Diagnosis not present

## 2012-08-23 DIAGNOSIS — E119 Type 2 diabetes mellitus without complications: Secondary | ICD-10-CM | POA: Diagnosis not present

## 2012-08-23 DIAGNOSIS — M21371 Foot drop, right foot: Secondary | ICD-10-CM

## 2012-08-23 DIAGNOSIS — M21372 Foot drop, left foot: Secondary | ICD-10-CM

## 2012-08-23 HISTORY — DX: Foot drop, right foot: M21.371

## 2012-08-23 HISTORY — DX: Type 2 diabetes mellitus with diabetic polyneuropathy: E11.42

## 2012-08-23 HISTORY — DX: Unspecified abnormalities of gait and mobility: R26.9

## 2012-08-23 HISTORY — DX: Foot drop, right foot: M21.372

## 2012-08-23 NOTE — Progress Notes (Signed)
Reason for visit: [place]  Steven [name] is a 79 y.o. male  History of present illness:  Steven [name] is a 79 year old right-handed white male with a history of diabetes that he indicates has been present for greater than 10 years. The patient however, is able to control his blood sugars fairly well, with a hemoglobin A1c of 6.3. The patient began to note that he was catching his left toe when he walked sometime in April 2013. 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 The patient has done well following the surgery. The patient indicates that he has been using a cane for ambulation. The patient had a right total knee replacement in September 2013. The patient is now wearing an AFO brace on the left foot, and this seems to be helpful. The patient still has an occasional fall. The patient reports no discomfort in the feet, and he does not notice any particular numbness in the feet or hands. The patient denies any neck discomfort or pain down into the arms. The patient denies any problems controlling the bowels or the bladder. The patient has had nerve conduction studies that show evidence of a peripheral neuropathy. The patient is sent to this office for an evaluation. The nerve conduction study was done by Dr. Murray Hodgkins.  Past Medical History  Diagnosis Date  . Diabetes mellitus     type II  . GERD (gastroesophageal reflux disease)   . Hyperlipidemia   . Anemia   . History of myocardial infarction   . Pneumonia     hx of  . Myocardial infarction 01-03-12    '04  . CAD (coronary artery disease) 2004    s/p inferior wall Mi 2004 with PTCA/Stent; s/p CABG 2004  . Arthritis 01-03-12    osteoarthritis-knees  . Neuropathy 01-03-12    bil. feet  . MYOCARDIAL INFARCTION, HX OF 04/19/2007    Qualifier: Diagnosis of  By: Everett Graff    . Heart disease   . Degenerative arthritis   .  Polyneuropathy in diabetes(357.2) 08/23/2012  . Foot drop, bilateral 08/23/2012  . Abnormality of gait 08/23/2012    Past Surgical History  Procedure Laterality Date  . Coronary artery bypass graft  2004    LIMA to LAD; SVG to ramus intermedius; SVG to PDA/PLSA  . Tonsillectomy    . Colonoscopy    . Laminectomy  09/08/2011    Procedure: LUMBAR LAMINECTOMY FOR TUMOR;  Surgeon: Maeola Harman, MD;  Location: [facility];  Service: Neurosurgery;  Laterality: Left;  Left Thoracic twelve-Lumbar one transpedicular resection of epidural mass  . Cardiac catheterization  01-03-12    '04  . Total knee arthroplasty  01/15/2012    Procedure: TOTAL KNEE ARTHROPLASTY;  Surgeon: Loanne Drilling, MD;  Location: [facility];  Service: Orthopedics;  Laterality: Right;    Family History  Problem Relation Age of Onset  . Cirrhosis Mother     etiology unclear  . Hypertension Father   . Stroke Father   . Cancer Sister     breast  . Stroke Maternal Grandmother   . Heart disease Maternal Grandfather     MI  . Stroke Paternal Grandmother   . Stroke Paternal Grandfather   . Anesthesia problems Neg Hx   . Hypotension Neg Hx   . Malignant hyperthermia Neg Hx   . Pseudochol deficiency Neg Hx  Social history:  reports that he has never smoked. He does not have any smokeless tobacco history on file. He reports that  drinks alcohol. He reports that he does not use illicit drugs.  Medications:  Current Outpatient Prescriptions on File Prior to Visit  Medication Sig Dispense Refill  . atorvastatin (LIPITOR) 40 MG tablet Take 1 tablet (40 mg total) by mouth at bedtime.  90 tablet  0  . beta carotene w/minerals (OCUVITE) tablet Take 1 tablet by mouth daily.      . bisacodyl (DULCOLAX) 10 MG suppository Place 1 suppository (10 mg total) rectally daily as needed.  12 suppository  0  . diclofenac (VOLTAREN) 75 MG EC tablet TAKE 1 TABLET (75 MG TOTAL) BY MOUTH 2 TIMES A DAY  180 tablet  0  . FreeStyle Unistick II Lancets  MISC Use once daily as instructed.       Marland Kitchen glucose blood (FREESTYLE LITE) test strip Use once daily as instructed  100 each  11  . glyBURIDE (DIABETA) 5 MG tablet TAKE 1 TABLET (5MG  TOTAL) BY MOUTH DAILY WITH BREAKFAST  90 tablet  0  . ketoconazole (NIZORAL) 2 % shampoo Apply topically 2 (two) times a week.  120 mL  3  . metFORMIN (GLUCOPHAGE) 1000 MG tablet Take 1 tablet (1,000 mg total) by mouth 2 (two) times daily with a meal.  180 tablet  0  . metoprolol succinate (TOPROL-XL) 25 MG 24 hr tablet TAKE 1 TABLET (25MG  TOTAL) BY MOUTH DAILY  90 tablet  0  . olopatadine (PATANOL) 0.1 % ophthalmic solution Place 1 drop into both eyes 2 (two) times daily.        Marland Kitchen omeprazole (PRILOSEC) 20 MG capsule TAKE 1 CAPSULE (20MG  TOTAL) BY MOUTH TWICE DAILY  180 capsule  0  . ramipril (ALTACE) 10 MG capsule TAKE 1 CAPSULE (10 MG TOTAL) BY MOUTH DAILY  90 capsule  0  . VERAMYST 27.5 MCG/SPRAY nasal spray USE 2 SPRAYS INTO EACH NOSTRIL DAILY AS DIRECTED  10 g  5  . [DISCONTINUED] testosterone cypionate (DEPO-TESTOSTERONE) 200 MG/ML injection Inject 1 mL (200 mg total) into the muscle every 30 (thirty) days.  10 mL  0   No current facility-administered medications on file prior to visit.    Allergies: No Known Allergies  ROS:  Out of a complete 14 system review of symptoms, the patient complains only of the following symptoms, and all other reviewed systems are negative.  Easy bruising Gait disorder   Blood pressure 108/61, pulse 76, height 5\' 5"  (1.651 m), weight 170 lb (77.111 kg).  Physical Exam  General: The patient is alert and cooperative at the time of the examination. The patient is minimally to moderately obese.  Head: Pupils are equal, round, and reactive to light. Discs are flat bilaterally.  Neck: The neck is supple, no carotid bruits are noted.  Respiratory: The respiratory examination is clear.  Cardiovascular: The cardiovascular examination reveals a regular rate and rhythm, no  obvious murmurs or rubs are noted.  Skin: 2+ edema is noted in the legs bilaterally below the knees.  Neurologic Exam  Mental status:  Cranial nerves: Facial symmetry is present. There is good sensation of the face to pinprick and soft touch bilaterally. The strength of the facial muscles and the muscles to head turning and shoulder shrug are normal bilaterally. Speech is well enunciated, no aphasia or dysarthria is noted. Extraocular movements are full. Visual fields are full.  Motor: The motor testing reveals  5 over 5 strength of all 4 extremities, with exception of bilateral foot drops and eversion weakness of the feet bilaterally. The patient is able to walk on the toes bilaterally. Good symmetric motor tone is noted throughout.  Sensory: Sensory testing is intact to pinprick, soft touch, vibration sensation, and position sense on the upper extremities. With the lower extremities, there is a pinprick sensory deficit in the distal one third of the legs below the knees. Vibration sensation and position sensation are moderately impaired in both feet. No evidence of extinction is noted.  Coordination: Cerebellar testing reveals good finger-nose-finger and heel-to-shin bilaterally.  Gait and station: Gait is slightly wide-based, unsteady. The patient has a steppage gait with the left leg. Tandem gait is unsteady. Romberg is negative. No drift is seen.  Reflexes: Deep tendon reflexes are symmetric, but are depressed bilaterally. Toes are downgoing bilaterally.   Assessment/Plan:  1. Peripheral neuropathy  2. Diabetes  3. Gait disorder  4. Bilateral foot drops  5. Degenerative arthritis  The patient has evidence on clinical examination of bilateral foot drops, somewhat worse on the left. The patient has a long-standing history of diabetes, and this is the likely etiology of his peripheral neuropathy. The patient has had a B12 level recently that was unremarkable. Other blood work will  be done today. The patient is not having discomfort with the neuropathy, and medications are therefore not indicated. The patient is considering a total knee replacement on the left. Bilateral total knee replacements may also worsen gait instability. The patient will followup through this office if needed. The patient may benefit from physical therapy for gait training, and I suppose this could be incorporated in his therapy for his total knee replacement in the future. The patient is to continue to use a cane. Eventually, he may require an AFO brace on the right as well.  Marlan Palau MD 08/24/2012 10:11 AM  [facility] [address] [place], [place] [address]  Phone [phone] Fax [phone]

## 2012-08-27 LAB — IFE AND PE, SERUM
Albumin SerPl Elph-Mcnc: 4.1 g/dL (ref 3.2–5.6)
Albumin/Glob SerPl: 1.8 (ref 0.7–2.0)
Alpha 1: 0.2 g/dL (ref 0.1–0.4)
Alpha2 Glob SerPl Elph-Mcnc: 0.7 g/dL (ref 0.4–1.2)
B-Globulin SerPl Elph-Mcnc: 1.1 g/dL (ref 0.6–1.3)
Gamma Glob SerPl Elph-Mcnc: 0.5 g/dL (ref 0.5–1.6)
IgG (Immunoglobin G), Serum: 695 mg/dL — ABNORMAL LOW (ref 700–1600)
Total Protein: 6.5 g/dL (ref 6.0–8.5)

## 2012-08-27 LAB — TSH: TSH: 1.34 u[IU]/mL (ref 0.450–4.500)

## 2012-08-30 ENCOUNTER — Other Ambulatory Visit: Payer: Self-pay | Admitting: Internal Medicine

## 2012-08-30 ENCOUNTER — Other Ambulatory Visit: Payer: Self-pay | Admitting: Orthopedic Surgery

## 2012-08-30 ENCOUNTER — Encounter (HOSPITAL_COMMUNITY): Payer: Self-pay | Admitting: Pharmacy Technician

## 2012-08-30 MED ORDER — BUPIVACAINE LIPOSOME 1.3 % IJ SUSP: 20.0000 mL | Freq: Once | INTRAMUSCULAR | Status: DC

## 2012-08-30 MED ORDER — DEXAMETHASONE SODIUM PHOSPHATE 10 MG/ML IJ SOLN: 10.0000 mg | Freq: Once | INTRAMUSCULAR | Status: DC

## 2012-08-30 NOTE — Progress Notes (Signed)
Preoperative surgical orders have been place into the Epic hospital system for Steven [name] on 08/30/2012, 4:05 PM  by Patrica Duel for surgery on 09/16/2012.  Preop Total Knee orders including Experal, IV Tylenol, and IV Decadron as long as there are no contraindications to the above medications. Avel Peace, PA-C

## 2012-09-03 ENCOUNTER — Encounter (HOSPITAL_COMMUNITY): Payer: Self-pay

## 2012-09-03 ENCOUNTER — Ambulatory Visit (HOSPITAL_COMMUNITY)
Admission: RE | Admit: 2012-09-03 | Discharge: 2012-09-03 | Disposition: A | Discharge status: Home / Self Care | Payer: Medicare Other | Source: Ambulatory Visit | Attending: Orthopedic Surgery | Admitting: Orthopedic Surgery

## 2012-09-03 ENCOUNTER — Encounter (HOSPITAL_COMMUNITY)
Admission: RE | Admit: 2012-09-03 | Discharge: 2012-09-03 | Disposition: A | Discharge status: Home / Self Care | Payer: Medicare Other | Source: Ambulatory Visit | Attending: Orthopedic Surgery | Admitting: Orthopedic Surgery

## 2012-09-03 DIAGNOSIS — I2510 Atherosclerotic heart disease of native coronary artery without angina pectoris: Secondary | ICD-10-CM | POA: Diagnosis not present

## 2012-09-03 DIAGNOSIS — Z01818 Encounter for other preprocedural examination: Secondary | ICD-10-CM | POA: Diagnosis not present

## 2012-09-03 DIAGNOSIS — I252 Old myocardial infarction: Secondary | ICD-10-CM | POA: Insufficient documentation

## 2012-09-03 DIAGNOSIS — I6529 Occlusion and stenosis of unspecified carotid artery: Secondary | ICD-10-CM | POA: Diagnosis not present

## 2012-09-03 DIAGNOSIS — Z01812 Encounter for preprocedural laboratory examination: Secondary | ICD-10-CM | POA: Diagnosis not present

## 2012-09-03 DIAGNOSIS — Z79899 Other long term (current) drug therapy: Secondary | ICD-10-CM | POA: Insufficient documentation

## 2012-09-03 DIAGNOSIS — IMO0002 Reserved for concepts with insufficient information to code with codable children: Secondary | ICD-10-CM | POA: Diagnosis not present

## 2012-09-03 DIAGNOSIS — M1710 Unilateral primary osteoarthritis, unspecified knee: Secondary | ICD-10-CM | POA: Insufficient documentation

## 2012-09-03 HISTORY — DX: Personal history of urinary calculi: Z87.442

## 2012-09-03 LAB — COMPREHENSIVE METABOLIC PANEL
ALT: 44 U/L (ref 0–53)
Alkaline Phosphatase: 84 U/L (ref 39–117)
CO2: 26 mEq/L (ref 19–32)
Calcium: 10 mg/dL (ref 8.4–10.5)
GFR calc Af Amer: >90 mL/min (ref >90)
GFR calc non Af Amer: 79 mL/min — ABNORMAL LOW (ref >90)
Glucose, Bld: 176 mg/dL — ABNORMAL HIGH (ref 70–99)
Potassium: 4.7 mEq/L (ref 3.5–5.1)
Sodium: 138 mEq/L (ref 135–145)

## 2012-09-03 LAB — URINALYSIS, ROUTINE W REFLEX MICROSCOPIC
Glucose, UA: 500 mg/dL — AB
Hgb urine dipstick: NEGATIVE
Ketones, ur: NEGATIVE mg/dL
Protein, ur: NEGATIVE mg/dL

## 2012-09-03 LAB — SURGICAL PCR SCREEN: Staphylococcus aureus: NEGATIVE

## 2012-09-03 LAB — CBC
HCT: 35.1 % — ABNORMAL LOW (ref 39.0–52.0)
Hemoglobin: 11.7 g/dL — ABNORMAL LOW (ref 13.0–17.0)
MCH: 30.8 pg (ref 26.0–34.0)
RBC: 3.8 MIL/uL — ABNORMAL LOW (ref 4.22–5.81)

## 2012-09-03 NOTE — Patient Instructions (Addendum)
[address] CLAYDEN WITHEM  09/03/2012   Your procedure is scheduled on: 09/16/12  Report to [facility] at [id number] AM.  Call this number if you have problems the morning of surgery [phone]   Remember:   Do not eat food or drink liquids After Midnight.     Take these medicines the morning of surgery with A SIP OF WATER: prilosec   Do not wear jewelry, make-up or nail polish.  Do not wear lotions, powders, or perfumes. You may wear deodorant.  Do not shave 48 hours prior to surgery. Men may shave face and neck.  Do not bring valuables to the hospital.  Contacts, dentures or bridgework may not be worn into surgery.  Leave suitcase in the car. After surgery it may be brought to your room.  For patients admitted to the hospital, checkout time is 11:00 AM the day of discharge.    Please read over the following fact sheets that you were given: MRSA Information, blood fact sheet, incentive spirometry fact sheet.  Birdie Sons, RN  pre op nurse call if needed [phone]    FAILURE TO FOLLOW THESE INSTRUCTIONS MAY RESULT IN CANCELLATION OF YOUR SURGERY   Patient Signature: ___________________________________________

## 2012-09-03 NOTE — Progress Notes (Signed)
EKG 10/02/11 on EPIC, Surgery clearance note Dr. Cato Mulligan on chart, Surgery clearance note Dr. Tenny Craw on chart.

## 2012-09-05 ENCOUNTER — Other Ambulatory Visit: Payer: Self-pay | Admitting: Internal Medicine

## 2012-09-15 NOTE — H&P (Signed)
TOTAL KNEE ADMISSION H&P  Patient is being admitted for left total knee arthroplasty.  Subjective:  Chief Complaint:left knee pain.  HPI: Steven [name], 79 y.o. male, has a history of pain and functional disability in the left knee due to arthritis and has failed non-surgical conservative treatments for greater than 12 weeks to includeNSAID's and/or analgesics, corticosteriod injections, viscosupplementation injections and activity modification.  Onset of symptoms was gradual, starting 4 years ago with gradually worsening course since that time. The patient noted no past surgery on the left knee(s).  Patient currently rates pain in the left knee(s) at 6 out of 10 with activity. Patient has night pain, worsening of pain with activity and weight bearing, pain that interferes with activities of daily living, pain with passive range of motion, crepitus and joint swelling.  Patient has evidence of periarticular osteophytes and joint space narrowing by imaging studies.  There is no active infection.  Patient Active Problem List   Diagnosis Date Noted  . Polyneuropathy in diabetes(357.2) 08/23/2012  . Foot drop, bilateral 08/23/2012  . Abnormality of gait 08/23/2012  . OA (osteoarthritis) of knee 01/15/2012  . Radiculopathy of lumbar region 08/23/2011  . SVT (supraventricular tachycardia) 01/25/2011  . TESTICULAR HYPOFUNCTION 02/04/2010  . PERSONAL HX COLONIC POLYPS 09/07/2009  . CAD, ARTERY BYPASS GRAFT 05/21/2009  . CAROTID ARTERY STENOSIS, WITHOUT INFARCTION 04/20/2008  . DIABETES MELLITUS, TYPE II 04/19/2007  . HYPERLIPIDEMIA 04/19/2007  . GERD 04/19/2007   Past Medical History  Diagnosis Date  . Diabetes mellitus     type II  . GERD (gastroesophageal reflux disease)   . Hyperlipidemia   . History of myocardial infarction   . Myocardial infarction 01-03-12    '04  . CAD (coronary artery disease) 2004    s/p inferior wall Mi 2004 with PTCA/Stent; s/p CABG 2004  . Arthritis 01-03-12     osteoarthritis-knees  . Neuropathy 01-03-12    bil. feet  . MYOCARDIAL INFARCTION, HX OF 04/19/2007    Qualifier: Diagnosis of  By: Everett Graff    . Heart disease   . Degenerative arthritis   . Polyneuropathy in diabetes(357.2) 08/23/2012  . Foot drop, bilateral 08/23/2012  . Abnormality of gait 08/23/2012  . Pneumonia     hx of  . History of kidney stones   . Anemia     hx of    Past Surgical History  Procedure Laterality Date  . Coronary artery bypass graft  2004    LIMA to LAD; SVG to ramus intermedius; SVG to PDA/PLSA  . Tonsillectomy  as child  . Colonoscopy    . Laminectomy  09/08/2011    Procedure: LUMBAR LAMINECTOMY FOR TUMOR;  Surgeon: Maeola Harman, MD;  Location: [facility];  Service: Neurosurgery;  Laterality: Left;  Left Thoracic twelve-Lumbar one transpedicular resection of epidural mass  . Cardiac catheterization  01-03-12    '04  . Total knee arthroplasty  01/15/2012    Procedure: TOTAL KNEE ARTHROPLASTY;  Surgeon: Loanne Drilling, MD;  Location: [facility];  Service: Orthopedics;  Laterality: Right;  . Back surgery  2013     Current outpatient prescriptions: ALPHA-LIPOIC ACID 100 MG TABS, Take 100 mg by mouth daily., Disp: , Rfl: ;  aspirin 81 MG tablet, Take 81 mg by mouth at bedtime. , Disp: , Rfl: ;  atorvastatin (LIPITOR) 40 MG tablet, Take 40 mg by mouth at bedtime., Disp: , Rfl: ;  beta carotene w/minerals (OCUVITE) tablet, Take 1 tablet by mouth daily.,  Disp: , Rfl: ;  Cinnamon 500 MG TABS, Take 500 mg by mouth daily., Disp: , Rfl:  diclofenac (VOLTAREN) 75 MG EC tablet, Take 75 mg by mouth 2 (two) times daily., Disp: , Rfl: ;  docusate sodium (COLACE) 100 MG capsule, Take 100 mg by mouth daily., Disp: , Rfl: ;  fish oil-omega-3 fatty acids 1000 MG capsule, Take 1 g by mouth daily., Disp: , Rfl: ;  FreeStyle Unistick II Lancets MISC, Use once daily as instructed. , Disp: , Rfl: ;  glucose blood (FREESTYLE LITE) test strip, Use once daily as instructed, Disp: 100 each,  Rfl: 11 glyBURIDE (DIABETA) 5 MG tablet, Take 5 mg by mouth daily with breakfast., Disp: , Rfl: ;  guaiFENesin (MUCINEX) 600 MG 12 hr tablet, Take 600 mg by mouth 2 (two) times daily as needed for congestion., Disp: , Rfl: ;  Hypertonic Nasal Wash (SINUS RINSE BOTTLE KIT NA), Place 1 application into the nose at bedtime., Disp: , Rfl: ;  Hypromellose (GENTEAL MILD) 0.2 % SOLN, Apply 1 drop to eye 2 (two) times daily as needed (dry eyes)., Disp: , Rfl:  ketoconazole (NIZORAL) 2 % shampoo, Apply 1 application topically 2 (two) times a week., Disp: , Rfl: ;  Menthol, Topical Analgesic, (ZIMS MAX-FREEZE EX), Apply topically daily., Disp: , Rfl: ;  metFORMIN (GLUCOPHAGE) 1000 MG tablet, Take 1,000 mg by mouth 2 (two) times daily with a meal., Disp: , Rfl: ;  metoprolol succinate (TOPROL-XL) 25 MG 24 hr tablet, TAKE 1 TABLET DAILY, Disp: 90 tablet, Rfl: 1 Multiple Vitamin (MULTIVITAMIN WITH MINERALS) TABS, Take 1 tablet by mouth daily., Disp: , Rfl: ;  Olopatadine HCl (PATADAY) 0.2 % SOLN, Apply 1 drop to eye 2 (two) times daily., Disp: , Rfl: ;  omeprazole (PRILOSEC) 20 MG capsule, Take 20 mg by mouth daily., Disp: , Rfl: ;  ramipril (ALTACE) 10 MG capsule, TAKE 1 CAPSULE DAILY, Disp: 90 capsule, Rfl: 1 VERAMYST 27.5 MCG/SPRAY nasal spray, USE 2 SPRAYS INTO EACH NOSTRIL DAILY AS DIRECTED, Disp: 10 g, Rfl: 1;    No Known Allergies  History  Substance Use Topics  . Smoking status: Never Smoker   . Smokeless tobacco: Never Used  . Alcohol Use: No    Family History  Problem Relation Age of Onset  . Cirrhosis Mother     etiology unclear  . Hypertension Father   . Stroke Father   . Cancer Sister     breast  . Stroke Maternal Grandmother   . Heart disease Maternal Grandfather     MI  . Stroke Paternal Grandmother   . Stroke Paternal Grandfather   . Anesthesia problems Neg Hx   . Hypotension Neg Hx   . Malignant hyperthermia Neg Hx   . Pseudochol deficiency Neg Hx      Review of Systems   Constitutional: Negative.   HENT: Negative.  Negative for neck pain.   Eyes: Negative.   Respiratory: Negative.   Cardiovascular: Negative.   Gastrointestinal: Negative.   Genitourinary: Negative.   Musculoskeletal: Positive for joint pain. Negative for myalgias, back pain and falls.       Left knee pain  Skin: Negative.   Neurological: Negative.   Endo/Heme/Allergies: Negative.   Psychiatric/Behavioral: Negative.     Objective:  Physical Exam  Constitutional: He is oriented to person, place, and time. He appears well-developed and well-nourished. No distress.  HENT:  Head: Normocephalic and atraumatic.  Right Ear: External ear normal.  Left Ear: External ear normal.  Nose: Nose normal.  Mouth/Throat: Oropharynx is clear and moist.  Eyes: Conjunctivae and EOM are normal.  Neck: Normal range of motion. Neck supple. No tracheal deviation present. No thyromegaly present.  Cardiovascular: Normal rate, regular rhythm, normal heart sounds and intact distal pulses.   No murmur heard. Respiratory: Effort normal and breath sounds normal. No respiratory distress. He has no wheezes. He exhibits no tenderness.  GI: Soft. Bowel sounds are normal. He exhibits no distension and no mass. There is no tenderness.  Musculoskeletal:       Right hip: Normal.       Left hip: Normal.       Right knee: Normal.       Left knee: He exhibits decreased range of motion and swelling. He exhibits no effusion and no erythema. Tenderness found. Medial joint line and lateral joint line tenderness noted.       Right lower leg: He exhibits no tenderness and no swelling.       Left lower leg: He exhibits no tenderness and no swelling.  The right knee looks great. He has a range of motion of 0-125 degrees. No tenderness or instability. His left knee shows a varus deformity. Range is 5-120. There is marked crepitus on range of motion. He is tender medial greater than lateral with no instability noted.   Lymphadenopathy:    He has no cervical adenopathy.  Neurological: He is alert and oriented to person, place, and time. He has normal strength and normal reflexes. No sensory deficit.  Skin: No rash noted. He is not diaphoretic. No erythema.  Psychiatric: He has a normal mood and affect. His behavior is normal.    Vitals Pulse: 68 (Regular) BP: 124/82 (Sitting, Left Arm, Standard)  Estimated body mass index is 25.16 kg/(m^2) as calculated from the following:   Height as of 01/15/12: 5\' 9"  (1.753 m).   Weight as of 01/03/12: 77.31 kg (170 lb 7 oz).   Imaging Review Plain radiographs demonstrate severe degenerative joint disease of the left knee(s). The overall alignment ismild varus. The bone quality appears to be good for age and reported activity level.  Assessment/Plan:  End stage arthritis, left knee   The patient history, physical examination, clinical judgment of the provider and imaging studies are consistent with end stage degenerative joint disease of the left knee(s) and total knee arthroplasty is deemed medically necessary. The treatment options including medical management, injection therapy arthroscopy and arthroplasty were discussed at length. The risks and benefits of total knee arthroplasty were presented and reviewed. The risks due to aseptic loosening, infection, stiffness, patella tracking problems, thromboembolic complications and other imponderables were discussed. The patient acknowledged the explanation, agreed to proceed with the plan and consent was signed. Patient is being admitted for inpatient treatment for surgery, pain control, PT, OT, prophylactic antibiotics, VTE prophylaxis, progressive ambulation and ADL's and discharge planning. The patient is planning to be discharged to skilled nursing facility [facility])    Dimitri Ped, [place]

## 2012-09-16 ENCOUNTER — Inpatient Hospital Stay (HOSPITAL_COMMUNITY): Payer: Medicare Other | Admitting: Certified Registered Nurse Anesthetist

## 2012-09-16 ENCOUNTER — Encounter (HOSPITAL_COMMUNITY): Payer: Self-pay | Admitting: Certified Registered Nurse Anesthetist

## 2012-09-16 ENCOUNTER — Encounter (HOSPITAL_COMMUNITY): Payer: Self-pay

## 2012-09-16 ENCOUNTER — Encounter (HOSPITAL_COMMUNITY)
Admission: RE | Disposition: A | Discharge status: Skilled Nursing Facility | Payer: Self-pay | Source: Ambulatory Visit | Attending: Orthopedic Surgery

## 2012-09-16 ENCOUNTER — Inpatient Hospital Stay (HOSPITAL_COMMUNITY)
Admission: RE | Admit: 2012-09-16 | Discharge: 2012-09-19 | DRG: 470 | Disposition: A | Discharge status: Skilled Nursing Facility | Payer: Medicare Other | Source: Ambulatory Visit | Attending: Orthopedic Surgery | Admitting: Orthopedic Surgery

## 2012-09-16 DIAGNOSIS — Z96659 Presence of unspecified artificial knee joint: Secondary | ICD-10-CM | POA: Diagnosis not present

## 2012-09-16 DIAGNOSIS — E871 Hypo-osmolality and hyponatremia: Secondary | ICD-10-CM | POA: Diagnosis not present

## 2012-09-16 DIAGNOSIS — Z79899 Other long term (current) drug therapy: Secondary | ICD-10-CM | POA: Diagnosis not present

## 2012-09-16 DIAGNOSIS — S8990XA Unspecified injury of unspecified lower leg, initial encounter: Secondary | ICD-10-CM | POA: Diagnosis not present

## 2012-09-16 DIAGNOSIS — I252 Old myocardial infarction: Secondary | ICD-10-CM | POA: Diagnosis not present

## 2012-09-16 DIAGNOSIS — I2510 Atherosclerotic heart disease of native coronary artery without angina pectoris: Secondary | ICD-10-CM | POA: Diagnosis present

## 2012-09-16 DIAGNOSIS — E1149 Type 2 diabetes mellitus with other diabetic neurological complication: Secondary | ICD-10-CM | POA: Diagnosis not present

## 2012-09-16 DIAGNOSIS — E119 Type 2 diabetes mellitus without complications: Secondary | ICD-10-CM | POA: Diagnosis not present

## 2012-09-16 DIAGNOSIS — M6281 Muscle weakness (generalized): Secondary | ICD-10-CM | POA: Diagnosis not present

## 2012-09-16 DIAGNOSIS — M1710 Unilateral primary osteoarthritis, unspecified knee: Secondary | ICD-10-CM | POA: Diagnosis not present

## 2012-09-16 DIAGNOSIS — M1990 Unspecified osteoarthritis, unspecified site: Secondary | ICD-10-CM | POA: Diagnosis not present

## 2012-09-16 DIAGNOSIS — Z9289 Personal history of other medical treatment: Secondary | ICD-10-CM

## 2012-09-16 DIAGNOSIS — R269 Unspecified abnormalities of gait and mobility: Secondary | ICD-10-CM | POA: Diagnosis not present

## 2012-09-16 DIAGNOSIS — Z96652 Presence of left artificial knee joint: Secondary | ICD-10-CM

## 2012-09-16 DIAGNOSIS — D62 Acute posthemorrhagic anemia: Secondary | ICD-10-CM | POA: Diagnosis not present

## 2012-09-16 DIAGNOSIS — IMO0002 Reserved for concepts with insufficient information to code with codable children: Secondary | ICD-10-CM | POA: Diagnosis not present

## 2012-09-16 DIAGNOSIS — Z471 Aftercare following joint replacement surgery: Secondary | ICD-10-CM | POA: Diagnosis not present

## 2012-09-16 DIAGNOSIS — E785 Hyperlipidemia, unspecified: Secondary | ICD-10-CM | POA: Diagnosis present

## 2012-09-16 DIAGNOSIS — M25569 Pain in unspecified knee: Secondary | ICD-10-CM | POA: Diagnosis not present

## 2012-09-16 DIAGNOSIS — I10 Essential (primary) hypertension: Secondary | ICD-10-CM | POA: Diagnosis not present

## 2012-09-16 DIAGNOSIS — K219 Gastro-esophageal reflux disease without esophagitis: Secondary | ICD-10-CM | POA: Diagnosis present

## 2012-09-16 DIAGNOSIS — K2100 Gastro-esophageal reflux disease with esophagitis, without bleeding: Secondary | ICD-10-CM | POA: Diagnosis not present

## 2012-09-16 DIAGNOSIS — R279 Unspecified lack of coordination: Secondary | ICD-10-CM | POA: Diagnosis not present

## 2012-09-16 HISTORY — PX: TOTAL KNEE ARTHROPLASTY: SHX125

## 2012-09-16 LAB — GLUCOSE, CAPILLARY
Glucose-Capillary: 182 mg/dL — ABNORMAL HIGH (ref 70–99)
Glucose-Capillary: 158 mg/dL — ABNORMAL HIGH (ref 70–99)
Glucose-Capillary: 193 mg/dL — ABNORMAL HIGH (ref 70–99)

## 2012-09-16 SURGERY — ARTHROPLASTY, KNEE, TOTAL
Anesthesia: General | Site: Knee | Laterality: Left | Wound class: Clean

## 2012-09-16 MED ORDER — METOPROLOL SUCCINATE ER 25 MG PO TB24
25.0000 mg | ORAL_TABLET | Freq: Every day | ORAL | Status: DC
  Administered 2012-09-17 – 2012-09-19 (×3): 25 mg via ORAL
  Filled 2012-09-16 (×3): qty 1

## 2012-09-16 MED ORDER — RIVAROXABAN 10 MG PO TABS
10.0000 mg | ORAL_TABLET | Freq: Every day | ORAL | Status: DC
  Administered 2012-09-17 – 2012-09-19 (×2): 10 mg via ORAL
  Filled 2012-09-16 (×4): qty 1

## 2012-09-16 MED ORDER — BUPIVACAINE LIPOSOME 1.3 % IJ SUSP
INTRAMUSCULAR | Status: DC | PRN
  Administered 2012-09-16: 20 mL

## 2012-09-16 MED ORDER — ACETAMINOPHEN 650 MG RE SUPP: 650.0000 mg | Freq: Four times a day (QID) | RECTAL | Status: DC | PRN

## 2012-09-16 MED ORDER — BUPIVACAINE LIPOSOME 1.3 % IJ SUSP
20.0000 mL | Freq: Once | INTRAMUSCULAR | Status: DC
  Filled 2012-09-16: qty 20

## 2012-09-16 MED ORDER — PROMETHAZINE HCL 25 MG/ML IJ SOLN: 6.2500 mg | INTRAMUSCULAR | Status: DC | PRN

## 2012-09-16 MED ORDER — DIPHENHYDRAMINE HCL 12.5 MG/5ML PO ELIX
12.5000 mg | ORAL_SOLUTION | ORAL | Status: DC | PRN
  Administered 2012-09-16: 12.5 mg via ORAL
  Administered 2012-09-17: 25 mg via ORAL
  Filled 2012-09-16: qty 10
  Filled 2012-09-16: qty 5
  Filled 2012-09-16: qty 10

## 2012-09-16 MED ORDER — SUCCINYLCHOLINE CHLORIDE 20 MG/ML IJ SOLN
INTRAMUSCULAR | Status: DC | PRN
  Administered 2012-09-16: 100 mg via INTRAVENOUS

## 2012-09-16 MED ORDER — CEFAZOLIN SODIUM 1-5 GM-% IV SOLN
1.0000 g | Freq: Four times a day (QID) | INTRAVENOUS | Status: AC
  Administered 2012-09-16 (×2): 1 g via INTRAVENOUS
  Filled 2012-09-16 (×2): qty 50

## 2012-09-16 MED ORDER — PROPOFOL 10 MG/ML IV BOLUS
INTRAVENOUS | Status: DC | PRN
  Administered 2012-09-16: 150 mg via INTRAVENOUS

## 2012-09-16 MED ORDER — POLYETHYLENE GLYCOL 3350 17 G PO PACK
17.0000 g | PACK | Freq: Every day | ORAL | Status: DC | PRN
  Administered 2012-09-18: 17 g via ORAL

## 2012-09-16 MED ORDER — HYDROMORPHONE HCL PF 1 MG/ML IJ SOLN
INTRAMUSCULAR | Status: DC | PRN
  Administered 2012-09-16 (×2): 0.5 mg via INTRAVENOUS

## 2012-09-16 MED ORDER — ONDANSETRON HCL 4 MG/2ML IJ SOLN: 4.0000 mg | Freq: Four times a day (QID) | INTRAMUSCULAR | Status: DC | PRN

## 2012-09-16 MED ORDER — OLOPATADINE HCL 0.2 % OP SOLN: 1.0000 [drp] | Freq: Two times a day (BID) | OPHTHALMIC | Status: DC

## 2012-09-16 MED ORDER — SODIUM CHLORIDE 0.9 % IJ SOLN
INTRAMUSCULAR | Status: DC | PRN
  Administered 2012-09-16: 30 mL

## 2012-09-16 MED ORDER — FLUTICASONE FUROATE 27.5 MCG/SPRAY NA SUSP: 2.0000 | Freq: Every day | NASAL | Status: DC

## 2012-09-16 MED ORDER — LIDOCAINE HCL (CARDIAC) 20 MG/ML IV SOLN
INTRAVENOUS | Status: DC | PRN
  Administered 2012-09-16: 100 mg via INTRAVENOUS

## 2012-09-16 MED ORDER — ONDANSETRON HCL 4 MG PO TABS: 4.0000 mg | ORAL_TABLET | Freq: Four times a day (QID) | ORAL | Status: DC | PRN

## 2012-09-16 MED ORDER — METFORMIN HCL 500 MG PO TABS
1000.0000 mg | ORAL_TABLET | Freq: Two times a day (BID) | ORAL | Status: DC
  Administered 2012-09-16: 1000 mg via ORAL
  Filled 2012-09-16 (×4): qty 2

## 2012-09-16 MED ORDER — TRAMADOL HCL 50 MG PO TABS
50.0000 mg | ORAL_TABLET | Freq: Four times a day (QID) | ORAL | Status: DC | PRN
  Administered 2012-09-17 – 2012-09-19 (×3): 100 mg via ORAL
  Filled 2012-09-16 (×3): qty 2

## 2012-09-16 MED ORDER — SODIUM CHLORIDE 0.9 % IV SOLN: INTRAVENOUS | Status: DC

## 2012-09-16 MED ORDER — MENTHOL 3 MG MT LOZG: 1.0000 | LOZENGE | OROMUCOSAL | Status: DC | PRN

## 2012-09-16 MED ORDER — BUPIVACAINE HCL 0.25 % IJ SOLN
INTRAMUSCULAR | Status: DC | PRN
  Administered 2012-09-16: 20 mL

## 2012-09-16 MED ORDER — HYDROMORPHONE HCL PF 1 MG/ML IJ SOLN
0.2500 mg | INTRAMUSCULAR | Status: DC | PRN
  Administered 2012-09-16 (×3): 0.5 mg via INTRAVENOUS

## 2012-09-16 MED ORDER — FLEET ENEMA 7-19 GM/118ML RE ENEM: 1.0000 | ENEMA | Freq: Once | RECTAL | Status: AC | PRN

## 2012-09-16 MED ORDER — OXYCODONE HCL 5 MG PO TABS: 5.0000 mg | ORAL_TABLET | ORAL | Status: DC | PRN

## 2012-09-16 MED ORDER — INSULIN ASPART 100 UNIT/ML ~~LOC~~ SOLN
0.0000 [IU] | Freq: Three times a day (TID) | SUBCUTANEOUS | Status: DC
  Administered 2012-09-16: 3 [IU] via SUBCUTANEOUS
  Administered 2012-09-17: 5 [IU] via SUBCUTANEOUS
  Administered 2012-09-17: 3 [IU] via SUBCUTANEOUS
  Administered 2012-09-17: 5 [IU] via SUBCUTANEOUS
  Administered 2012-09-18 – 2012-09-19 (×4): 3 [IU] via SUBCUTANEOUS
  Administered 2012-09-19: 5 [IU] via SUBCUTANEOUS

## 2012-09-16 MED ORDER — ROCURONIUM BROMIDE 100 MG/10ML IV SOLN
INTRAVENOUS | Status: DC | PRN
  Administered 2012-09-16: 30 mg via INTRAVENOUS

## 2012-09-16 MED ORDER — DOCUSATE SODIUM 100 MG PO CAPS
100.0000 mg | ORAL_CAPSULE | Freq: Two times a day (BID) | ORAL | Status: DC
  Administered 2012-09-16 – 2012-09-19 (×6): 100 mg via ORAL

## 2012-09-16 MED ORDER — LACTATED RINGERS IV SOLN
INTRAVENOUS | Status: DC
  Administered 2012-09-16: 1000 mL via INTRAVENOUS

## 2012-09-16 MED ORDER — GLYBURIDE 5 MG PO TABS
5.0000 mg | ORAL_TABLET | Freq: Every day | ORAL | Status: DC
  Administered 2012-09-17 – 2012-09-19 (×3): 5 mg via ORAL
  Filled 2012-09-16 (×4): qty 1

## 2012-09-16 MED ORDER — BISACODYL 10 MG RE SUPP: 10.0000 mg | Freq: Every day | RECTAL | Status: DC | PRN

## 2012-09-16 MED ORDER — OLOPATADINE HCL 0.1 % OP SOLN
1.0000 [drp] | Freq: Two times a day (BID) | OPHTHALMIC | Status: DC
  Administered 2012-09-18 – 2012-09-19 (×3): 1 [drp] via OPHTHALMIC
  Filled 2012-09-16: qty 5

## 2012-09-16 MED ORDER — OCUVITE PO TABS
1.0000 | ORAL_TABLET | Freq: Every day | ORAL | Status: DC
  Administered 2012-09-16 – 2012-09-19 (×4): 1 via ORAL
  Filled 2012-09-16 (×4): qty 1

## 2012-09-16 MED ORDER — GLYCOPYRROLATE 0.2 MG/ML IJ SOLN
INTRAMUSCULAR | Status: DC | PRN
  Administered 2012-09-16: 0.6 mg via INTRAVENOUS

## 2012-09-16 MED ORDER — PHENYLEPHRINE HCL 10 MG/ML IJ SOLN
INTRAMUSCULAR | Status: DC | PRN
  Administered 2012-09-16 (×3): 80 ug via INTRAVENOUS

## 2012-09-16 MED ORDER — METOCLOPRAMIDE HCL 10 MG PO TABS: 5.0000 mg | ORAL_TABLET | Freq: Three times a day (TID) | ORAL | Status: DC | PRN

## 2012-09-16 MED ORDER — POLYVINYL ALCOHOL 1.4 % OP SOLN
1.0000 [drp] | OPHTHALMIC | Status: DC | PRN
  Filled 2012-09-16: qty 15

## 2012-09-16 MED ORDER — DEXAMETHASONE 6 MG PO TABS
10.0000 mg | ORAL_TABLET | Freq: Three times a day (TID) | ORAL | Status: AC
  Administered 2012-09-16 – 2012-09-17 (×2): 10 mg via ORAL
  Filled 2012-09-16 (×3): qty 1

## 2012-09-16 MED ORDER — RAMIPRIL 10 MG PO CAPS
10.0000 mg | ORAL_CAPSULE | Freq: Every day | ORAL | Status: DC
  Administered 2012-09-17 – 2012-09-19 (×3): 10 mg via ORAL
  Filled 2012-09-16 (×3): qty 1

## 2012-09-16 MED ORDER — SODIUM CHLORIDE 0.9 % IV SOLN
INTRAVENOUS | Status: DC
  Administered 2012-09-16 – 2012-09-17 (×2): via INTRAVENOUS

## 2012-09-16 MED ORDER — ESMOLOL HCL 10 MG/ML IV SOLN
INTRAVENOUS | Status: DC | PRN
  Administered 2012-09-16 (×2): 20 mg via INTRAVENOUS

## 2012-09-16 MED ORDER — MIDAZOLAM HCL 5 MG/5ML IJ SOLN
INTRAMUSCULAR | Status: DC | PRN
  Administered 2012-09-16: 1 mg via INTRAVENOUS

## 2012-09-16 MED ORDER — FENTANYL CITRATE 0.05 MG/ML IJ SOLN
INTRAMUSCULAR | Status: DC | PRN
  Administered 2012-09-16 (×2): 25 ug via INTRAVENOUS
  Administered 2012-09-16 (×4): 50 ug via INTRAVENOUS

## 2012-09-16 MED ORDER — 0.9 % SODIUM CHLORIDE (POUR BTL) OPTIME
TOPICAL | Status: DC | PRN
  Administered 2012-09-16: 1000 mL

## 2012-09-16 MED ORDER — ACETAMINOPHEN 325 MG PO TABS: 650.0000 mg | ORAL_TABLET | Freq: Four times a day (QID) | ORAL | Status: DC | PRN

## 2012-09-16 MED ORDER — PHENOL 1.4 % MT LIQD: 1.0000 | OROMUCOSAL | Status: DC | PRN

## 2012-09-16 MED ORDER — ONDANSETRON HCL 4 MG/2ML IJ SOLN
INTRAMUSCULAR | Status: DC | PRN
  Administered 2012-09-16: 4 mg via INTRAVENOUS

## 2012-09-16 MED ORDER — NEOSTIGMINE METHYLSULFATE 1 MG/ML IJ SOLN
INTRAMUSCULAR | Status: DC | PRN
  Administered 2012-09-16: 5 mg via INTRAVENOUS

## 2012-09-16 MED ORDER — METOCLOPRAMIDE HCL 5 MG/ML IJ SOLN: 5.0000 mg | Freq: Three times a day (TID) | INTRAMUSCULAR | Status: DC | PRN

## 2012-09-16 MED ORDER — GUAIFENESIN ER 600 MG PO TB12
600.0000 mg | ORAL_TABLET | Freq: Two times a day (BID) | ORAL | Status: DC | PRN
  Administered 2012-09-17: 600 mg via ORAL
  Filled 2012-09-16: qty 1

## 2012-09-16 MED ORDER — DEXAMETHASONE SODIUM PHOSPHATE 10 MG/ML IJ SOLN
10.0000 mg | Freq: Three times a day (TID) | INTRAMUSCULAR | Status: AC
  Administered 2012-09-16: 10 mg via INTRAVENOUS
  Filled 2012-09-16 (×3): qty 1

## 2012-09-16 MED ORDER — CEFAZOLIN SODIUM-DEXTROSE 2-3 GM-% IV SOLR
2.0000 g | INTRAVENOUS | Status: AC
  Administered 2012-09-16: 2 g via INTRAVENOUS

## 2012-09-16 MED ORDER — METHOCARBAMOL 100 MG/ML IJ SOLN
500.0000 mg | Freq: Four times a day (QID) | INTRAVENOUS | Status: DC | PRN
  Administered 2012-09-16: 500 mg via INTRAVENOUS
  Filled 2012-09-16: qty 5

## 2012-09-16 MED ORDER — PANTOPRAZOLE SODIUM 40 MG PO TBEC
40.0000 mg | DELAYED_RELEASE_TABLET | Freq: Every day | ORAL | Status: DC
  Administered 2012-09-16: 40 mg via ORAL
  Filled 2012-09-16 (×2): qty 1

## 2012-09-16 MED ORDER — METHOCARBAMOL 500 MG PO TABS: 500.0000 mg | ORAL_TABLET | Freq: Four times a day (QID) | ORAL | Status: DC | PRN

## 2012-09-16 MED ORDER — ATORVASTATIN CALCIUM 40 MG PO TABS
40.0000 mg | ORAL_TABLET | Freq: Every day | ORAL | Status: DC
  Administered 2012-09-16 – 2012-09-18 (×3): 40 mg via ORAL
  Filled 2012-09-16 (×4): qty 1

## 2012-09-16 MED ORDER — ACETAMINOPHEN 10 MG/ML IV SOLN: 1000.0000 mg | Freq: Once | INTRAVENOUS | Status: DC

## 2012-09-16 MED ORDER — SODIUM CHLORIDE 0.9 % IR SOLN
Status: DC | PRN
  Administered 2012-09-16: 1000 mL

## 2012-09-16 MED ORDER — HYPROMELLOSE 0.2 % OP SOLN: 1.0000 [drp] | Freq: Two times a day (BID) | OPHTHALMIC | Status: DC | PRN

## 2012-09-16 MED ORDER — FLUTICASONE PROPIONATE 50 MCG/ACT NA SUSP
2.0000 | Freq: Every day | NASAL | Status: DC
  Administered 2012-09-16: 2 via NASAL
  Filled 2012-09-16: qty 16

## 2012-09-16 MED ORDER — METFORMIN HCL 500 MG PO TABS
1000.0000 mg | ORAL_TABLET | Freq: Two times a day (BID) | ORAL | Status: DC
Start: 2012-09-16 — End: 2012-09-16

## 2012-09-16 MED ORDER — MORPHINE SULFATE 2 MG/ML IJ SOLN: 1.0000 mg | INTRAMUSCULAR | Status: DC | PRN

## 2012-09-16 SURGICAL SUPPLY — 57 items
BAG SPEC THK2 15X12 ZIP CLS (MISCELLANEOUS) ×1
BAG ZIPLOCK 12X15 (MISCELLANEOUS) ×2 IMPLANT
BANDAGE ELASTIC 4 VELCRO ST LF (GAUZE/BANDAGES/DRESSINGS) ×1 IMPLANT
BANDAGE ELASTIC 6 VELCRO ST LF (GAUZE/BANDAGES/DRESSINGS) ×2 IMPLANT
BANDAGE ESMARK 6X9 LF (GAUZE/BANDAGES/DRESSINGS) ×1 IMPLANT
BLADE SAG 18X100X1.27 (BLADE) ×2 IMPLANT
BLADE SAW SGTL 11.0X1.19X90.0M (BLADE) ×2 IMPLANT
BNDG CMPR 9X6 STRL LF SNTH (GAUZE/BANDAGES/DRESSINGS) ×1
BNDG ESMARK 6X9 LF (GAUZE/BANDAGES/DRESSINGS) ×2
BOWL SMART MIX CTS (DISPOSABLE) ×2 IMPLANT
CEMENT HV SMART SET (Cement) ×3 IMPLANT
CLOTH BEACON ORANGE TIMEOUT ST (SAFETY) ×2 IMPLANT
CUFF TOURN SGL QUICK 34 (TOURNIQUET CUFF) ×2
CUFF TRNQT CYL 34X4X40X1 (TOURNIQUET CUFF) ×1 IMPLANT
DECANTER SPIKE VIAL GLASS SM (MISCELLANEOUS) ×2 IMPLANT
DRAPE EXTREMITY T 121X128X90 (DRAPE) ×2 IMPLANT
DRAPE POUCH INSTRU U-SHP 10X18 (DRAPES) ×2 IMPLANT
DRAPE U-SHAPE 47X51 STRL (DRAPES) ×2 IMPLANT
DRSG ADAPTIC 3X8 NADH LF (GAUZE/BANDAGES/DRESSINGS) ×2 IMPLANT
DRSG PAD ABDOMINAL 8X10 ST (GAUZE/BANDAGES/DRESSINGS) ×2 IMPLANT
DURAPREP 26ML APPLICATOR (WOUND CARE) ×2 IMPLANT
ELECT REM PT RETURN 9FT ADLT (ELECTROSURGICAL) ×2
ELECTRODE REM PT RTRN 9FT ADLT (ELECTROSURGICAL) ×1 IMPLANT
EVACUATOR 1/8 PVC DRAIN (DRAIN) ×2 IMPLANT
FACESHIELD LNG OPTICON STERILE (SAFETY) ×10 IMPLANT
GLOVE BIO SURGEON STRL SZ7.5 (GLOVE) IMPLANT
GLOVE BIO SURGEON STRL SZ8 (GLOVE) ×2 IMPLANT
GLOVE BIOGEL PI IND STRL 8 (GLOVE) ×1 IMPLANT
GLOVE BIOGEL PI INDICATOR 8 (GLOVE) ×1
GLOVE SURG SS PI 6.5 STRL IVOR (GLOVE) IMPLANT
GOWN STRL NON-REIN LRG LVL3 (GOWN DISPOSABLE) ×2 IMPLANT
GOWN STRL REIN XL XLG (GOWN DISPOSABLE) IMPLANT
HANDPIECE INTERPULSE COAX TIP (DISPOSABLE) ×2
IMMOBILIZER KNEE 20 (SOFTGOODS) ×2
IMMOBILIZER KNEE 20 THIGH 36 (SOFTGOODS) ×1 IMPLANT
KIT BASIN OR (CUSTOM PROCEDURE TRAY) ×2 IMPLANT
MANIFOLD NEPTUNE II (INSTRUMENTS) ×2 IMPLANT
NDL SAFETY ECLIPSE 18X1.5 (NEEDLE) ×2 IMPLANT
NEEDLE HYPO 18GX1.5 SHARP (NEEDLE) ×4
NS IRRIG 1000ML POUR BTL (IV SOLUTION) ×2 IMPLANT
PACK TOTAL JOINT (CUSTOM PROCEDURE TRAY) ×2 IMPLANT
PADDING CAST COTTON 6X4 STRL (CAST SUPPLIES) ×2 IMPLANT
POSITIONER SURGICAL ARM (MISCELLANEOUS) ×2 IMPLANT
SET HNDPC FAN SPRY TIP SCT (DISPOSABLE) ×1 IMPLANT
SPONGE GAUZE 4X4 12PLY (GAUZE/BANDAGES/DRESSINGS) ×2 IMPLANT
STRIP CLOSURE SKIN 1/2X4 (GAUZE/BANDAGES/DRESSINGS) ×3 IMPLANT
SUCTION FRAZIER 12FR DISP (SUCTIONS) ×2 IMPLANT
SUT MNCRL AB 4-0 PS2 18 (SUTURE) ×2 IMPLANT
SUT VIC AB 2-0 CT1 27 (SUTURE) ×6
SUT VIC AB 2-0 CT1 TAPERPNT 27 (SUTURE) ×3 IMPLANT
SUT VLOC 180 0 24IN GS25 (SUTURE) ×2 IMPLANT
SYR 20CC LL (SYRINGE) ×2 IMPLANT
SYR 50ML LL SCALE MARK (SYRINGE) ×2 IMPLANT
TOWEL OR 17X26 10 PK STRL BLUE (TOWEL DISPOSABLE) ×4 IMPLANT
TRAY FOLEY CATH 14FRSI W/METER (CATHETERS) ×2 IMPLANT
WATER STERILE IRR 1500ML POUR (IV SOLUTION) ×2 IMPLANT
WRAP KNEE MAXI GEL POST OP (GAUZE/BANDAGES/DRESSINGS) ×2 IMPLANT

## 2012-09-16 NOTE — Interval H&P Note (Signed)
History and Physical Interval Note:  09/16/2012 9:35 AM  Steven [name]  has presented today for surgery, with the diagnosis of OA LEFT KNEE   The various methods of treatment have been discussed with the patient and family. After consideration of risks, benefits and other options for treatment, the patient has consented to  Procedure(s): LEFT TOTAL KNEE ARTHROPLASTY (Left) as a surgical intervention .  The patient's history has been reviewed, patient examined, no change in status, stable for surgery.  I have reviewed the patient's chart and labs.  Questions were answered to the patient's satisfaction.     Loanne Drilling

## 2012-09-16 NOTE — Anesthesia Preprocedure Evaluation (Signed)
Anesthesia Evaluation  Patient identified by MRN, date of birth, ID band Patient awake    Reviewed: Allergy & Precautions, H&P , NPO status , Patient's Chart, lab work & pertinent test results  Airway Mallampati: II TM Distance: >3 FB Neck ROM: Full    Dental  (+) Teeth Intact and Dental Advisory Given   Pulmonary pneumonia -,  breath sounds clear to auscultation  Pulmonary exam normal       Cardiovascular hypertension, Pt. on home beta blockers + CAD, + Past MI, + Cardiac Stents, + CABG and + Peripheral Vascular Disease + dysrhythmias Rhythm:Regular Rate:Normal     Neuro/Psych Bilateral foot drop; prior lumbar laminectomy for tumor  Neuromuscular disease negative psych ROS   GI/Hepatic Neg liver ROS, GERD-  ,  Endo/Other  negative endocrine ROSdiabetes, Type 2, Oral Hypoglycemic Agents  Renal/GU negative Renal ROS  negative genitourinary   Musculoskeletal negative musculoskeletal ROS (+)   Abdominal   Peds  Hematology negative hematology ROS (+)   Anesthesia Other Findings   Reproductive/Obstetrics negative OB ROS                           Anesthesia Physical Anesthesia Plan  ASA: III  Anesthesia Plan: General   Post-op Pain Management:    Induction: Intravenous  Airway Management Planned: Oral ETT  Additional Equipment:   Intra-op Plan:   Post-operative Plan: Extubation in OR  Informed Consent: I have reviewed the patients History and Physical, chart, labs and discussed the procedure including the risks, benefits and alternatives for the proposed anesthesia with the patient or authorized representative who has indicated his/her understanding and acceptance.   Dental advisory given  Plan Discussed with: CRNA  Anesthesia Plan Comments:         Anesthesia Quick Evaluation

## 2012-09-16 NOTE — Anesthesia Postprocedure Evaluation (Signed)
Anesthesia Post Note  Patient: Steven [name]  Procedure(s) Performed: Procedure(s) (LRB): LEFT TOTAL KNEE ARTHROPLASTY (Left)  Anesthesia type: General  Patient location: PACU  Post pain: Pain level controlled  Post assessment: Post-op Vital signs reviewed  Last Vitals:  Filed Vitals:   09/16/12 1404  BP: 148/65  Pulse: 72  Temp: 36 C  Resp: 14    Post vital signs: Reviewed  Level of consciousness: sedated  Complications: No apparent anesthesia complications

## 2012-09-16 NOTE — Op Note (Signed)
Pre-operative diagnosis- Osteoarthritis  Left knee(s)  Post-operative diagnosis- Osteoarthritis Left knee(s)  Procedure-  Left  Total Knee Arthroplasty  Surgeon- Gus Rankin. Kyjuan Gause, MD  Assistant- Dimitri Ped, PA-C   Anesthesia-  General EBL-* No blood loss amount entered *  Drains Hemovac  Tourniquet time-  Total Tourniquet Time Documented: Thigh (Left) - 34 minutes Total: Thigh (Left) - 34 minutes    Complications- None  Condition-PACU - hemodynamically stable.   Brief Clinical Note   Steven [name] is a 79 y.o. year old male with end stage OA of his left knee with progressively worsening pain and dysfunction. He has constant pain, with activity and at rest and significant functional deficits with difficulties even with ADLs. He has had extensive non-op management including analgesics, injections of cortisone and viscosupplements, and home exercise program, but remains in significant pain with significant dysfunction. Radiographs show bone on bone arthritis medial and patellofemoral. He presents now for left Total Knee Arthroplasty.     Procedure in detail---   The patient is brought into the operating room and positioned supine on the operating table. After successful administration of  General,   a tourniquet is placed high on the  Left thigh(s) and the lower extremity is prepped and draped in the usual sterile fashion. Time out is performed by the operating team and then the  Left lower extremity is wrapped in Esmarch, knee flexed and the tourniquet inflated to 300 mmHg.       A midline incision is made with a ten blade through the subcutaneous tissue to the level of the extensor mechanism. A fresh blade is used to make a medial parapatellar arthrotomy. Soft tissue over the proximal medial tibia is subperiosteally elevated to the joint line with a knife and into the semimembranosus bursa with a Cobb elevator. Soft tissue over the proximal lateral tibia is elevated with attention  being paid to avoiding the patellar tendon on the tibial tubercle. The patella is everted, knee flexed 90 degrees and the ACL and PCL are removed. Findings are bone on bone medial and patellofemoral with large global osteophytes.        The drill is used to create a starting hole in the distal femur and the canal is thoroughly irrigated with sterile saline to remove the fatty contents. The 5 degree Left  valgus alignment guide is placed into the femoral canal and the distal femoral cutting block is pinned to remove 10 mm off the distal femur. Resection is made with an oscillating saw.      The tibia is subluxed forward and the menisci are removed. The extramedullary alignment guide is placed referencing proximally at the medial aspect of the tibial tubercle and distally along the second metatarsal axis and tibial crest. The block is pinned to remove 2mm off the more deficient medial  side. Resection is made with an oscillating saw. Size 4is the most appropriate size for the tibia and the proximal tibia is prepared with the modular drill and keel punch for that size.      The femoral sizing guide is placed and size 4 is most appropriate. Rotation is marked off the epicondylar axis and confirmed by creating a rectangular flexion gap at 90 degrees. The size 4 cutting block is pinned in this rotation and the anterior, posterior and chamfer cuts are made with the oscillating saw. The intercondylar block is then placed and that cut is made.      Trial size 4 tibial component, trial  size 4 posterior stabilized femur and a 12.5  mm posterior stabilized rotating platform insert trial is placed. Full extension is achieved with excellent varus/valgus and anterior/posterior balance throughout full range of motion. The patella is everted and thickness measured to be 24  mm. Free hand resection is taken to 14 mm, a 38 template is placed, lug holes are drilled, trial patella is placed, and it tracks normally. Osteophytes are  removed off the posterior femur with the trial in place. All trials are removed and the cut bone surfaces prepared with pulsatile lavage. Cement is mixed and once ready for implantation, the size 4 tibial implant, size  4 posterior stabilized femoral component, and the size 38 patella are cemented in place and the patella is held with the clamp. The trial insert is placed and the knee held in full extension. The Exparel (20 ml mixed with 30 ml saline) and .25% Bupivicaine, are injected into the extensor mechanism, posterior capsule, medial and lateral gutters and subcutaneous tissues.  All extruded cement is removed and once the cement is hard the permanent 12.5 mm posterior stabilized rotating platform insert is placed into the tibial tray.      The wound is copiously irrigated with saline solution and the extensor mechanism closed over a hemovac drain with #1 PDS suture. The tourniquet is released for a total tourniquet time of 34  minutes. Flexion against gravity is 135 degrees and the patella tracks normally. Subcutaneous tissue is closed with 2.0 vicryl and subcuticular with running 4.0 Monocryl. The incision is cleaned and dried and steri-strips and a bulky sterile dressing are applied. The limb is placed into a knee immobilizer and the patient is awakened and transported to recovery in stable condition.      Please note that a surgical assistant was a medical necessity for this procedure in order to perform it in a safe and expeditious manner. Surgical assistant was necessary to retract the ligaments and vital neurovascular structures to prevent injury to them and also necessary for proper positioning of the limb to allow for anatomic placement of the prosthesis.   Gus Rankin Della Homan, MD    09/16/2012, 12:30 PM

## 2012-09-16 NOTE — Preoperative (Signed)
Beta Blockers   Reason not to administer Beta Blockers:Not Applicable Pt took Beta Blocker 09-15-12 at 2200

## 2012-09-16 NOTE — Progress Notes (Signed)
Clinical Social Work Department BRIEF PSYCHOSOCIAL ASSESSMENT 09/16/2012  Patient:  Steven [name], Steven [name]     Account Number:  [id number]     Admit date:  09/16/2012  Clinical Social Worker:  Dennison Bulla  Date/Time:  09/16/2012 03:00 PM  Referred by:  Physician  Date Referred:  09/16/2012 Referred for  SNF Placement   Other Referral:   Interview type:  Patient Other interview type:    PSYCHOSOCIAL DATA Living Status:  FAMILY Admitted from facility:   Level of care:   Primary support name:  Nettie Elm Primary support relationship to patient:  SPOUSE Degree of support available:   STrong    CURRENT CONCERNS Current Concerns  Post-Acute Placement   Other Concerns:    SOCIAL WORK ASSESSMENT / PLAN CSW received referral to assist with DC planning. CSW reviewed chart which stated that patient would need SNF after surgery. CSW met with patient and wife in room. CSW introduced myself and explained role.    Patient reports that prior to surgery he had toured SNFs and decided to go to [facility] at DC. CSW explained that CSW would complete paperwork and explained DC process to patient. Patient and wife agreeable to plans.    CSW contacted SNF who reports patient is pre-registered and will accept patient once medically stable. CSW completed FL2 and placed on shadow chart for MD signature. CSW will continue to follow.   Assessment/plan status:  Psychosocial Support/Ongoing Assessment of Needs Other assessment/ plan:   Information/referral to community resources:   SNF information    PATIENT'S/FAMILY'S RESPONSE TO PLAN OF CARE: Patient alert and engaged in assessment. Patient thanked CSW for time and agreeable to follow up.

## 2012-09-16 NOTE — Progress Notes (Signed)
Clinical Social Work Department CLINICAL SOCIAL WORK PLACEMENT NOTE 09/16/2012  Patient:  Steven [name], Steven [name]  Account Number:  [id number] Admit date:  09/16/2012  Clinical Social Worker:  Unk Lightning, LCSW  Date/time:  09/16/2012 03:00 PM  Clinical Social Work is seeking post-discharge placement for this patient at the following level of care:   SKILLED NURSING   (*CSW will update this form in Epic as items are completed)   09/16/2012  Patient/family provided with [facility] Department of Clinical Social Work's list of facilities offering this level of care within the geographic area requested by the patient (or if unable, by the patient's family).  09/16/2012  Patient/family informed of their freedom to choose among providers that offer the needed level of care, that participate in Medicare, Medicaid or managed care program needed by the patient, have an available bed and are willing to accept the patient.  09/16/2012  Patient/family informed of [facility]' ownership interest in [facility], as well as of the fact that they are under no obligation to receive care at this facility.  PASARR submitted to EDS on existing # PASARR number received from E[employer] on   FL2 transmitted to all facilities in geographic area requested by pt/family on  09/16/2012 FL2 transmitted to all facilities within larger geographic area on   Patient informed that his/her managed care company has contracts with or will negotiate with  certain facilities, including the following:     Patient/family informed of bed offers received:  09/16/2012 Patient chooses bed at [facility] Physician recommends and patient chooses bed at    Patient to be transferred to [facility] on   Patient to be transferred to facility by   The following physician request were entered in Epic:   Additional Comments:

## 2012-09-16 NOTE — Transfer of Care (Signed)
Immediate Anesthesia Transfer of Care Note  Patient: Steven [name]  Procedure(s) Performed: Procedure(s) (LRB): LEFT TOTAL KNEE ARTHROPLASTY (Left)  Patient Location: PACU  Anesthesia Type: General  Level of Consciousness: sedated, patient cooperative and responds to stimulaton  Airway & Oxygen Therapy: Patient Spontanous Breathing and Patient connected to face mask oxgen  Post-op Assessment: Report given to PACU RN and Post -op Vital signs reviewed and stable  Post vital signs: Reviewed and stable  Complications: No apparent anesthesia complications

## 2012-09-17 ENCOUNTER — Encounter (HOSPITAL_COMMUNITY): Payer: Self-pay | Admitting: Orthopedic Surgery

## 2012-09-17 DIAGNOSIS — D62 Acute posthemorrhagic anemia: Secondary | ICD-10-CM

## 2012-09-17 DIAGNOSIS — E871 Hypo-osmolality and hyponatremia: Secondary | ICD-10-CM

## 2012-09-17 LAB — BASIC METABOLIC PANEL
BUN: 18 mg/dL (ref 6–23)
CO2: 25 mEq/L (ref 19–32)
Calcium: 9.3 mg/dL (ref 8.4–10.5)
Creatinine, Ser: 0.79 mg/dL (ref 0.50–1.35)

## 2012-09-17 LAB — GLUCOSE, CAPILLARY
Glucose-Capillary: 240 mg/dL — ABNORMAL HIGH (ref 70–99)
Glucose-Capillary: 191 mg/dL — ABNORMAL HIGH (ref 70–99)
Glucose-Capillary: 177 mg/dL — ABNORMAL HIGH (ref 70–99)

## 2012-09-17 LAB — CBC
HCT: 25.7 % — ABNORMAL LOW (ref 39.0–52.0)
MCH: 30.9 pg (ref 26.0–34.0)
MCV: 90.2 fL (ref 78.0–100.0)
Platelets: 203 10*3/uL (ref 150–400)
RBC: 2.85 MIL/uL — ABNORMAL LOW (ref 4.22–5.81)
RDW: 12.6 % (ref 11.5–15.5)

## 2012-09-17 MED ORDER — NON FORMULARY: 20.0000 mg | Freq: Every day | Status: DC

## 2012-09-17 MED ORDER — FLUTICASONE PROPIONATE 50 MCG/ACT NA SUSP
2.0000 | Freq: Every day | NASAL | Status: DC
  Administered 2012-09-18: 2 via NASAL

## 2012-09-17 MED ORDER — OMEPRAZOLE 20 MG PO CPDR
20.0000 mg | DELAYED_RELEASE_CAPSULE | Freq: Every day | ORAL | Status: DC
  Administered 2012-09-17 – 2012-09-19 (×3): 20 mg via ORAL
  Filled 2012-09-17 (×3): qty 1

## 2012-09-17 NOTE — Evaluation (Signed)
Physical Therapy Evaluation Patient Details Name: Steven [name] MRN: [id number] DOB: 26-Mar-1931 Today's Date: 09/17/2012 Time: 4098-1191 PT Time Calculation (min): 26 min  PT Assessment / Plan / Recommendation Clinical Impression  Pt is a 79 year old male s/p L TKR with hx of L foot drop which requires AFO.  Pt would benefit from acute PT services in order to improve independence with transfers and ambulation by increasing L LE strength and ROM to prepare for d/c to SNF.   L AFO donned EOB for ambulation.  Discussed being aware of brace placement at anterior tibia and knee incision to prevent skin breakdown or rubbing on incision  (ace bandage in place however did not appear to overlie incision area when AFO donned), also did not use KI due to use of AFO.    PT Assessment  Patient needs continued PT services    Follow Up Recommendations  SNF    Does the patient have the potential to tolerate intense rehabilitation      Barriers to Discharge        Equipment Recommendations  None recommended by PT    Recommendations for Other Services     Frequency 7X/week    Precautions / Restrictions Precautions Precautions: Knee Required Braces or Orthoses: Knee Immobilizer - Left Knee Immobilizer - Left: Other (comment);Discontinue once straight leg raise with < 10 degree lag (not applied due to using AFO for foot drop) Restrictions Other Position/Activity Restrictions: WBAT   Pertinent Vitals/Pain Pt reports increased knee pain with activity/mobility, ice applied, RN notified      Mobility  Bed Mobility Bed Mobility: Supine to Sit Supine to Sit: 4: Min assist;HOB elevated Details for Bed Mobility Assistance: verbal cues for technique, assist for L LE Transfers Transfers: Sit to Stand;Stand to Sit Sit to Stand: 4: Min assist;From bed;With upper extremity assist Stand to Sit: 4: Min assist;To chair/3-in-1;With upper extremity assist Details for Transfer Assistance: verbal cues for  safe technique including hand placement and L LE forward Ambulation/Gait Ambulation/Gait Assistance: 4: Min assist Ambulation Distance (Feet): 24 Feet Assistive device: Rolling walker Ambulation/Gait Assistance Details: verbal cues for technique, sequence, RW distance, step length Gait Pattern: Step-to pattern;Decreased stance time - left;Trunk flexed Gait velocity: decreased General Gait Details: limited distance due to pain    Exercises     PT Diagnosis: Acute pain;Difficulty walking  PT Problem List: Decreased strength;Decreased range of motion;Decreased activity tolerance;Decreased mobility;Decreased knowledge of precautions;Decreased knowledge of use of DME;Pain PT Treatment Interventions: Functional mobility training;Gait training;DME instruction;Therapeutic activities;Therapeutic exercise;Patient/family education   PT Goals Acute Rehab PT Goals PT Goal Formulation: With patient Time For Goal Achievement: 09/24/12 Potential to Achieve Goals: Good Pt will go Supine/Side to Sit: with supervision PT Goal: Supine/Side to Sit - Progress: Goal set today Pt will go Sit to Stand: with supervision PT Goal: Sit to Stand - Progress: Goal set today Pt will go Stand to Sit: with supervision PT Goal: Stand to Sit - Progress: Goal set today Pt will Ambulate: 51 - 150 feet;with supervision;with least restrictive assistive device PT Goal: Ambulate - Progress: Goal set today Pt will Perform Home Exercise Program: with supervision, verbal cues required/provided PT Goal: Perform Home Exercise Program - Progress: Goal set today  Visit Information  Last PT Received On: 09/17/12 Assistance Needed: +2    Subjective Data  Subjective: Turn the air down. Put your finger back there (donning shoe).   Patient Stated Goal: SNF   Prior Functioning  Home Living Lives With: Spouse  Available Help at Discharge: Skilled Nursing Facility Prior Function Level of Independence: Independent with assistive  device(s) Communication Communication: No difficulties    Cognition  Cognition Arousal/Alertness: Awake/alert Behavior During Therapy: WFL for tasks assessed/performed Overall Cognitive Status: Within Functional Limits for tasks assessed    Extremity/Trunk Assessment Right Upper Extremity Assessment RUE ROM/Strength/Tone: [facility] for tasks assessed Left Upper Extremity Assessment LUE ROM/Strength/Tone: Deficits LUE ROM/Strength/Tone Deficits: decreased active shoulder flexion, assist required to reach for trapeze bar Right Lower Extremity Assessment RLE ROM/Strength/Tone: [facility] for tasks assessed Left Lower Extremity Assessment LLE ROM/Strength/Tone: Deficits LLE ROM/Strength/Tone Deficits: L knee AAROM -8-50*, hx of foot drop so requires AFO   Balance    End of Session PT - End of Session Equipment Utilized During Treatment: Left ankle foot orthosis Activity Tolerance: Patient limited by pain;Patient limited by fatigue Patient left: in chair;with call bell/phone within reach  GP     [facility] E 09/17/2012, 12:12 PM Zenovia Jarred, PT, DPT 09/17/2012 Pager: [phone]

## 2012-09-17 NOTE — Progress Notes (Signed)
   Subjective: 1 Day Post-Op Procedure(s) (LRB): LEFT TOTAL KNEE ARTHROPLASTY (Left) Patient reports pain as mild.   Patient seen in rounds with Dr. Lequita Halt. Patient is well, but has had some minor complaints of pain in the knee, requiring pain medications We will start therapy today.  Plan is to go to [facility] after hospital stay.  Objective: Vital signs in last 24 hours: Temp:  [96.8 F (36 C)-98.8 F (37.1 C)] 98.8 F (37.1 C) (06/03 0547) Pulse Rate:  [69-102] 97 (06/03 0547) Resp:  [14-19] 16 (06/03 0547) BP: (118-179)/(59-90) 133/75 mmHg (06/03 0547) SpO2:  [98 %-100 %] 99 % (06/03 0547) FiO2 (%):  [98 %] 98 % (06/02 1404) Weight:  [76.658 kg (169 lb)] 76.658 kg (169 lb) (06/02 1404)  Intake/Output from previous day:  Intake/Output Summary (Last 24 hours) at 09/17/12 0804 Last data filed at 09/17/12 0600  Gross per 24 hour  Intake   4520 ml  Output   4295 ml  Net    225 ml    Intake/Output this shift: UOP 2850 since MN +225  Labs:  Recent Labs  09/17/12 0429  HGB 8.8*    Recent Labs  09/17/12 0429  WBC 10.5  RBC 2.85*  HCT 25.7*  PLT 203    Recent Labs  09/17/12 0429  NA 133*  K 4.3  CL 98  CO2 25  BUN 18  CREATININE 0.79  GLUCOSE 257*  CALCIUM 9.3   No results found for this basename: LABPT, INR,  in the last 72 hours  EXAM General - Patient is Alert, Appropriate and Oriented Extremity - Neurovascular intact Sensation intact distally No cellulitis present Pre-existing Foot Drop (NOT NEW) Dressing - dressing C/D/I Motor Function - intact, moving foot and toes well on exam.  Hemovac pulled without difficulty.  Past Medical History  Diagnosis Date  . Diabetes mellitus     type II  . GERD (gastroesophageal reflux disease)   . Hyperlipidemia   . History of myocardial infarction   . Myocardial infarction 01-03-12    '04  . CAD (coronary artery disease) 2004    s/p inferior wall Mi 2004 with PTCA/Stent; s/p CABG 2004  .  Arthritis 01-03-12    osteoarthritis-knees  . Neuropathy 01-03-12    bil. feet  . MYOCARDIAL INFARCTION, HX OF 04/19/2007    Qualifier: Diagnosis of  By: Everett Graff    . Heart disease   . Degenerative arthritis   . Polyneuropathy in diabetes(357.2) 08/23/2012  . Foot drop, bilateral 08/23/2012  . Abnormality of gait 08/23/2012  . Pneumonia     hx of  . History of kidney stones   . Anemia     hx of    Assessment/Plan: 1 Day Post-Op Procedure(s) (LRB): LEFT TOTAL KNEE ARTHROPLASTY (Left) Active Problems:   Hyponatremia   Postoperative anemia due to acute blood loss  Estimated body mass index is 24.95 kg/(m^2) as calculated from the following:   Height as of this encounter: 5\' 9"  (1.753 m).   Weight as of this encounter: 76.658 kg (169 lb). Advance diet Up with therapy Discharge to SNF  DVT Prophylaxis - Xarelto, ASA on hold Weight-Bearing as tolerated to left leg No vaccines. D/C O2 and Pulse OX and try on Room Air  [name], [name] 09/17/2012, 8:04 AM

## 2012-09-17 NOTE — Progress Notes (Signed)
Physical Therapy Treatment Note   09/17/12 1600  PT Visit Information  Last PT Received On 09/17/12  Assistance Needed +2  PT Time Calculation  PT Start Time 1349  PT Stop Time 1417  PT Time Calculation (min) 28 min  Subjective Data  Subjective I'll sit up a little longer.  Patient Stated Goal SNF  Precautions  Precautions Knee  Required Braces or Orthoses Knee Immobilizer - Left  Knee Immobilizer - Left Other (comment);Discontinue once straight leg raise with < 10 degree lag (not applied due to using AFO for foot drop)  Restrictions  Other Position/Activity Restrictions WBAT  Cognition  Arousal/Alertness Awake/alert  Behavior During Therapy WFL for tasks assessed/performed  Overall Cognitive Status Within Functional Limits for tasks assessed  Transfers  Transfers Sit to Stand;Stand to Sit  Sit to Stand 4: Min guard;With upper extremity assist;From chair/3-in-1  Stand to Sit 4: Min guard;With upper extremity assist;To chair/3-in-1  Details for Transfer Assistance verbal cues for safe technique including hand placement and L LE forward  Ambulation/Gait  Ambulation/Gait Assistance 4: Min assist  Ambulation Distance (Feet) 24 Feet  Assistive device Rolling walker  Ambulation/Gait Assistance Details verbal cues for technique, step length, RW distance, sequence  Gait Pattern Step-to pattern;Decreased stance time - left;Trunk flexed  Gait velocity decreased  General Gait Details pt self limited distance due to pain and fatigue, +2 for safety  Exercises  Exercises Total Joint  Total Joint Exercises  Ankle Circles/Pumps AROM;Both;15 reps  Quad Sets AROM;Left;10 reps  Short Arc Quad AAROM;10 reps;Left  Heel Slides AAROM;Left;10 reps  Hip ABduction/ADduction AAROM;Left;10 reps  Straight Leg Raises AAROM;Left;10 reps  PT - End of Session  Equipment Utilized During Treatment Left ankle foot orthosis  Activity Tolerance Patient limited by pain;Patient limited by fatigue  Patient  left in chair;with call bell/phone within reach  PT - Assessment/Plan  Comments on Treatment Session Pt ambulated again in hallway and performed exercises in recliner.  PT Plan Discharge plan remains appropriate;Frequency remains appropriate  Follow Up Recommendations SNF  PT equipment None recommended by PT  Acute Rehab PT Goals  PT Goal: Sit to Stand - Progress Progressing toward goal  PT Goal: Stand to Sit - Progress Progressing toward goal  PT Goal: Ambulate - Progress Progressing toward goal  PT Goal: Perform Home Exercise Program - Progress Progressing toward goal  PT General Charges  $$ ACUTE PT VISIT 1 Procedure  PT Treatments  $Gait Training 8-22 mins  $Therapeutic Exercise 8-22 mins   Premedicated, ice applied  Zenovia Jarred, PT, DPT 09/17/2012 Pager: [phone]

## 2012-09-17 NOTE — Progress Notes (Signed)
OT Cancellation Note  Patient Details Name: Steven [name] MRN: [id number] DOB: 07-18-1930   Cancelled Treatment:     Noted pt plans snf for rehab.  Will defer OT eval to that venue.  Shamieka Gullo 09/17/2012, 7:22 AM Marica Otter, OTR/L [phone] 09/17/2012

## 2012-09-17 NOTE — Progress Notes (Signed)
Utilization review completed.  

## 2012-09-18 DIAGNOSIS — Z9289 Personal history of other medical treatment: Secondary | ICD-10-CM

## 2012-09-18 LAB — CBC
MCH: 30 pg (ref 26.0–34.0)
Platelets: 167 10*3/uL (ref 150–400)
RBC: 2.4 MIL/uL — ABNORMAL LOW (ref 4.22–5.81)

## 2012-09-18 LAB — BASIC METABOLIC PANEL
CO2: 27 mEq/L (ref 19–32)
Calcium: 8.9 mg/dL (ref 8.4–10.5)
GFR calc non Af Amer: 81 mL/min — ABNORMAL LOW (ref >90)
Sodium: 130 mEq/L — ABNORMAL LOW (ref 135–145)

## 2012-09-18 LAB — GLUCOSE, CAPILLARY: Glucose-Capillary: 206 mg/dL — ABNORMAL HIGH (ref 70–99)

## 2012-09-18 MED ORDER — FUROSEMIDE 10 MG/ML IJ SOLN
10.0000 mg | Freq: Once | INTRAMUSCULAR | Status: AC
  Administered 2012-09-18: 10 mg via INTRAVENOUS
  Filled 2012-09-18: qty 1

## 2012-09-18 MED ORDER — ACETAMINOPHEN 325 MG PO TABS
650.0000 mg | ORAL_TABLET | Freq: Once | ORAL | Status: AC
  Administered 2012-09-18: 650 mg via ORAL
  Filled 2012-09-18: qty 2

## 2012-09-18 NOTE — Progress Notes (Signed)
09/18/12 0865 Nursing Weston Settle PA called reg hgb 7.1 this am. Order received to type and cross and transfuse 2 units PRBCs

## 2012-09-18 NOTE — Progress Notes (Signed)
   Subjective: 2 Days Post-Op Procedure(s) (LRB): LEFT TOTAL KNEE ARTHROPLASTY (Left) Patient reports pain as mild and moderate.   Patient seen in rounds with Dr. Lequita Halt.  HGB is down to 7.1.  Will give blood today. Patient is well, but has had some minor complaints of pain in the knee, requiring pain medications Plan is to go Skilled nursing facility after hospital stay.  Objective: Vital signs in last 24 hours: Temp:  [97.8 F (36.6 C)-99.6 F (37.6 C)] 97.8 F (36.6 C) (06/04 0905) Pulse Rate:  [78-106] 93 (06/04 0905) Resp:  [16-22] 18 (06/04 0905) BP: (119-175)/(62-85) 133/72 mmHg (06/04 0905) SpO2:  [93 %-100 %] 93 % (06/04 0629)  Intake/Output from previous day:  Intake/Output Summary (Last 24 hours) at 09/18/12 1033 Last data filed at 09/18/12 1029  Gross per 24 hour  Intake  972.5 ml  Output   2125 ml  Net -1152.5 ml    Intake/Output this shift: Total I/O In: 492.5 [P.O.:480; Blood:12.5] Out: 350 [Urine:350]  Labs:  Recent Labs  09/17/12 0429 09/18/12 0439  HGB 8.8* 7.1*    Recent Labs  09/17/12 0429 09/18/12 0439  WBC 10.5 10.9*  RBC 2.85* 2.40*  HCT 25.7* 21.3*  PLT 203 167    Recent Labs  09/17/12 0429 09/18/12 0439  NA 133* 130*  K 4.3 4.1  CL 98 97  CO2 25 27  BUN 18 20  CREATININE 0.79 0.85  GLUCOSE 257* 201*  CALCIUM 9.3 8.9   No results found for this basename: LABPT, INR,  in the last 72 hours  EXAM General - Patient is Alert, Appropriate and Oriented Extremity - Neurovascular intact Sensation intact distally Dorsiflexion/Plantar flexion intact No cellulitis present Dressing/Incision - clean, dry, no drainage, healing Motor Function - intact, moving foot and toes well on exam.   Past Medical History  Diagnosis Date  . Diabetes mellitus     type II  . GERD (gastroesophageal reflux disease)   . Hyperlipidemia   . History of myocardial infarction   . Myocardial infarction 01-03-12    '04  . CAD (coronary artery  disease) 2004    s/p inferior wall Mi 2004 with PTCA/Stent; s/p CABG 2004  . Arthritis 01-03-12    osteoarthritis-knees  . Neuropathy 01-03-12    bil. feet  . MYOCARDIAL INFARCTION, HX OF 04/19/2007    Qualifier: Diagnosis of  By: Everett Graff    . Heart disease   . Degenerative arthritis   . Polyneuropathy in diabetes(357.2) 08/23/2012  . Foot drop, bilateral 08/23/2012  . Abnormality of gait 08/23/2012  . Pneumonia     hx of  . History of kidney stones   . Anemia     hx of    Assessment/Plan: 2 Days Post-Op Procedure(s) (LRB): LEFT TOTAL KNEE ARTHROPLASTY (Left) Active Problems:   Hyponatremia   Postoperative anemia due to acute blood loss   Postop Transfusion  Estimated body mass index is 24.95 kg/(m^2) as calculated from the following:   Height as of this encounter: 5\' 9"  (1.753 m).   Weight as of this encounter: 76.658 kg (169 lb). Up with therapy Discharge to SNF tomorrow Blood today  DVT Prophylaxis - Xarelto Weight-Bearing as tolerated to left leg  Steven [name] 09/18/2012, 10:33 AM

## 2012-09-18 NOTE — Progress Notes (Signed)
Physical Therapy Treatment Patient Details Name: Steven [name] MRN: [id number] DOB: November 22, 1930 Today's Date: 09/18/2012 Time: 7829-5621 PT Time Calculation (min): 25 min  PT Assessment / Plan / Recommendation Comments on Treatment Session  Pt s/p one unit of PRBCs just prior to arrival and able to increase ambulation distance a little today.  Pt also performed exercises in recliner.  Pt reports d/c to SNF tomorrow.    Follow Up Recommendations  SNF     Does the patient have the potential to tolerate intense rehabilitation     Barriers to Discharge        Equipment Recommendations  None recommended by PT    Recommendations for Other Services    Frequency     Plan Discharge plan remains appropriate;Frequency remains appropriate    Precautions / Restrictions Precautions Precautions: Knee Required Braces or Orthoses: Knee Immobilizer - Left Knee Immobilizer - Left: Other (comment);Discontinue once straight leg raise with < 10 degree lag (not applied due to using AFO) Restrictions Other Position/Activity Restrictions: WBAT   Pertinent Vitals/Pain RN notified of request for pain meds, ice packs applied    Mobility  Bed Mobility Bed Mobility: Supine to Sit Supine to Sit: 4: Min assist;HOB elevated Details for Bed Mobility Assistance: verbal cues for technique, assist for L LE, therapist donned AFO and shoes EOB Transfers Transfers: Sit to Stand;Stand to Sit Sit to Stand: 4: Min assist;From bed;With upper extremity assist Stand to Sit: 4: Min assist;To chair/3-in-1;With upper extremity assist Details for Transfer Assistance: verbal cues for safe technique including hand placement and L LE forward Ambulation/Gait Ambulation/Gait Assistance: 4: Min assist Ambulation Distance (Feet): 40 Feet Assistive device: Rolling walker Ambulation/Gait Assistance Details: verbal cues for posture, RW distance,  +2 for safety, recliner brought to pt Gait Pattern: Step-to pattern;Decreased  stance time - left;Trunk flexed Gait velocity: decreased    Exercises Total Joint Exercises Ankle Circles/Pumps: AROM;Both;20 reps Quad Sets: AROM;Left;20 reps Short Arc Quad: Left;AAROM;20 reps Heel Slides: AAROM;Left;10 reps Hip ABduction/ADduction: AAROM;Left;15 reps Straight Leg Raises: AAROM;Left;10 reps (2 sets of 10)   PT Diagnosis:    PT Problem List:   PT Treatment Interventions:     PT Goals Acute Rehab PT Goals PT Goal: Supine/Side to Sit - Progress: Progressing toward goal PT Goal: Sit to Stand - Progress: Progressing toward goal PT Goal: Stand to Sit - Progress: Progressing toward goal PT Goal: Ambulate - Progress: Progressing toward goal PT Goal: Perform Home Exercise Program - Progress: Progressing toward goal  Visit Information  Last PT Received On: 09/18/12 Assistance Needed: +2    Subjective Data  Subjective: Are we gonna do this before lunch? Patient Stated Goal: SNF   Cognition  Cognition Arousal/Alertness: Awake/alert Behavior During Therapy: WFL for tasks assessed/performed Overall Cognitive Status: Within Functional Limits for tasks assessed    Balance     End of Session PT - End of Session Equipment Utilized During Treatment: Left ankle foot orthosis Activity Tolerance: Patient limited by fatigue;Patient limited by pain Patient left: in chair;with call bell/phone within reach Nurse Communication: Patient requests pain meds   GP     Janis Sol,KATHrine E 09/18/2012, 1:35 PM Zenovia Jarred, PT, DPT 09/18/2012 Pager: [phone]

## 2012-09-19 DIAGNOSIS — D62 Acute posthemorrhagic anemia: Secondary | ICD-10-CM | POA: Diagnosis not present

## 2012-09-19 DIAGNOSIS — M1710 Unilateral primary osteoarthritis, unspecified knee: Secondary | ICD-10-CM | POA: Diagnosis not present

## 2012-09-19 DIAGNOSIS — R279 Unspecified lack of coordination: Secondary | ICD-10-CM | POA: Diagnosis not present

## 2012-09-19 DIAGNOSIS — S8990XA Unspecified injury of unspecified lower leg, initial encounter: Secondary | ICD-10-CM | POA: Diagnosis not present

## 2012-09-19 DIAGNOSIS — S99929A Unspecified injury of unspecified foot, initial encounter: Secondary | ICD-10-CM | POA: Diagnosis not present

## 2012-09-19 DIAGNOSIS — M6281 Muscle weakness (generalized): Secondary | ICD-10-CM | POA: Diagnosis not present

## 2012-09-19 DIAGNOSIS — R269 Unspecified abnormalities of gait and mobility: Secondary | ICD-10-CM | POA: Diagnosis not present

## 2012-09-19 DIAGNOSIS — Z471 Aftercare following joint replacement surgery: Secondary | ICD-10-CM | POA: Diagnosis not present

## 2012-09-19 DIAGNOSIS — E119 Type 2 diabetes mellitus without complications: Secondary | ICD-10-CM | POA: Diagnosis not present

## 2012-09-19 DIAGNOSIS — E1149 Type 2 diabetes mellitus with other diabetic neurological complication: Secondary | ICD-10-CM | POA: Diagnosis not present

## 2012-09-19 DIAGNOSIS — Z96659 Presence of unspecified artificial knee joint: Secondary | ICD-10-CM | POA: Diagnosis not present

## 2012-09-19 DIAGNOSIS — M25569 Pain in unspecified knee: Secondary | ICD-10-CM | POA: Diagnosis not present

## 2012-09-19 DIAGNOSIS — I25810 Atherosclerosis of coronary artery bypass graft(s) without angina pectoris: Secondary | ICD-10-CM | POA: Diagnosis not present

## 2012-09-19 DIAGNOSIS — I10 Essential (primary) hypertension: Secondary | ICD-10-CM | POA: Diagnosis not present

## 2012-09-19 DIAGNOSIS — M1990 Unspecified osteoarthritis, unspecified site: Secondary | ICD-10-CM | POA: Diagnosis not present

## 2012-09-19 DIAGNOSIS — E785 Hyperlipidemia, unspecified: Secondary | ICD-10-CM | POA: Diagnosis not present

## 2012-09-19 LAB — TYPE AND SCREEN

## 2012-09-19 LAB — CBC
HCT: 25.4 % — ABNORMAL LOW (ref 39.0–52.0)
Hemoglobin: 8.9 g/dL — ABNORMAL LOW (ref 13.0–17.0)
MCV: 84.9 fL (ref 78.0–100.0)
RBC: 2.99 MIL/uL — ABNORMAL LOW (ref 4.22–5.81)
WBC: 9.6 10*3/uL (ref 4.0–10.5)

## 2012-09-19 LAB — BASIC METABOLIC PANEL
CO2: 25 mEq/L (ref 19–32)
Calcium: 8.6 mg/dL (ref 8.4–10.5)
Creatinine, Ser: 0.77 mg/dL (ref 0.50–1.35)
GFR calc non Af Amer: 84 mL/min — ABNORMAL LOW (ref >90)

## 2012-09-19 MED ORDER — METOCLOPRAMIDE HCL 5 MG PO TABS: 5.0000 mg | ORAL_TABLET | Freq: Three times a day (TID) | ORAL | Status: DC | PRN

## 2012-09-19 MED ORDER — METFORMIN HCL 500 MG PO TABS
1000.0000 mg | ORAL_TABLET | Freq: Two times a day (BID) | ORAL | Status: DC
  Administered 2012-09-19: 1000 mg via ORAL
  Filled 2012-09-19 (×3): qty 2

## 2012-09-19 MED ORDER — ONDANSETRON HCL 4 MG PO TABS: 4.0000 mg | ORAL_TABLET | Freq: Four times a day (QID) | ORAL | Status: DC | PRN

## 2012-09-19 MED ORDER — TRAMADOL HCL 50 MG PO TABS: 50.0000 mg | ORAL_TABLET | Freq: Four times a day (QID) | ORAL | Status: DC | PRN

## 2012-09-19 MED ORDER — METHOCARBAMOL 500 MG PO TABS: 500.0000 mg | ORAL_TABLET | Freq: Four times a day (QID) | ORAL | Status: DC | PRN

## 2012-09-19 MED ORDER — RIVAROXABAN 10 MG PO TABS: 10.0000 mg | ORAL_TABLET | Freq: Every day | ORAL | Status: DC

## 2012-09-19 MED ORDER — POLYETHYLENE GLYCOL 3350 17 G PO PACK: 17.0000 g | PACK | Freq: Every day | ORAL | Status: DC | PRN

## 2012-09-19 MED ORDER — ACETAMINOPHEN 325 MG PO TABS: 650.0000 mg | ORAL_TABLET | Freq: Four times a day (QID) | ORAL | Status: DC | PRN

## 2012-09-19 MED ORDER — DIPHENHYDRAMINE HCL 12.5 MG/5ML PO ELIX: 12.5000 mg | ORAL_SOLUTION | ORAL | Status: DC | PRN

## 2012-09-19 MED ORDER — BISACODYL 10 MG RE SUPP: 10.0000 mg | Freq: Every day | RECTAL | Status: DC | PRN

## 2012-09-19 MED ORDER — OXYCODONE HCL 5 MG PO TABS: 5.0000 mg | ORAL_TABLET | ORAL | Status: DC | PRN

## 2012-09-19 NOTE — Progress Notes (Signed)
Physical Therapy Treatment Patient Details Name: Steven [name] MRN: [id number] DOB: 1930/05/16 Today's Date: 09/19/2012 Time: 1001-1027 PT Time Calculation (min): 26 min  PT Assessment / Plan / Recommendation Comments on Treatment Session  Pt ambulated in hallway and performed exercises in recliner.  Ice packs applied after session.  Pt to d/c to SNF today.    Follow Up Recommendations  SNF     Does the patient have the potential to tolerate intense rehabilitation     Barriers to Discharge        Equipment Recommendations  None recommended by PT    Recommendations for Other Services    Frequency     Plan Discharge plan remains appropriate;Frequency remains appropriate    Precautions / Restrictions Precautions Precautions: Knee Required Braces or Orthoses: Knee Immobilizer - Left Knee Immobilizer - Left: Other (comment);Discontinue once straight leg raise with < 10 degree lag (not applied due to using AFO) Restrictions Weight Bearing Restrictions: No Other Position/Activity Restrictions: WBAT   Pertinent Vitals/Pain Premedicated, ice packs applied    Mobility  Bed Mobility Bed Mobility: Supine to Sit Supine to Sit: 4: Min assist;HOB elevated Details for Bed Mobility Assistance: verbal cues for technique, assist for L LE, therapist donned AFO and shoes EOB Transfers Transfers: Sit to Stand;Stand to Sit Sit to Stand: 4: Min assist;From bed;With upper extremity assist Stand to Sit: 4: Min assist;To chair/3-in-1;With upper extremity assist Details for Transfer Assistance: verbal cues for safe technique including hand placement and L LE forward, assist pt with pulling up shorts upon standing (required assist to steady) Ambulation/Gait Ambulation/Gait Assistance: 4: Min assist Ambulation Distance (Feet): 60 Feet Assistive device: Rolling walker Ambulation/Gait Assistance Details: verbal cues for sequence, RW distance, posture, encouraged farther distance however pt felt  unable so recliner brought behind pt Gait Pattern: Step-to pattern;Decreased stance time - left;Trunk flexed;Decreased dorsiflexion - right Gait velocity: decreased General Gait Details: pt states he may need AFO for R ankle, decreased DF during gait however able to perform ankle pumps    Exercises Total Joint Exercises Ankle Circles/Pumps: AROM;Both;20 reps;Other (comment) (AFO on L ) Quad Sets: AROM;20 reps;Both Gluteal Sets: AROM;Both;20 reps Short Arc Quad: Left;AAROM;20 reps Heel Slides: AAROM;Left;15 reps Hip ABduction/ADduction: AAROM;Left;15 reps Straight Leg Raises: AAROM;Left;15 reps (2 sets of 10)   PT Diagnosis:    PT Problem List:   PT Treatment Interventions:     PT Goals Acute Rehab PT Goals PT Goal: Supine/Side to Sit - Progress: Progressing toward goal PT Goal: Sit to Stand - Progress: Progressing toward goal PT Goal: Stand to Sit - Progress: Progressing toward goal PT Goal: Ambulate - Progress: Progressing toward goal PT Goal: Perform Home Exercise Program - Progress: Progressing toward goal  Visit Information  Last PT Received On: 09/19/12 Assistance Needed: +1    Subjective Data  Subjective: I guess this is the last time I'll see you.   Cognition  Cognition Arousal/Alertness: Awake/alert Behavior During Therapy: WFL for tasks assessed/performed Overall Cognitive Status: Within Functional Limits for tasks assessed    Balance     End of Session PT - End of Session Equipment Utilized During Treatment: Left ankle foot orthosis Activity Tolerance: Patient limited by fatigue;Patient limited by pain Patient left: in chair;with call bell/phone within reach   GP     Ethyl Vila,KATHrine E 09/19/2012, 11:55 AM Zenovia Jarred, PT, DPT 09/19/2012 Pager: [phone]

## 2012-09-19 NOTE — Discharge Summary (Signed)
Physician Discharge Summary   Patient ID: Steven [name] MRN: [id number] DOB/AGE: Apr 30, 1930 79 y.o.  Admit date: 09/16/2012 Discharge date: 09/19/2012  Primary Diagnosis:  Osteoarthritis Left knee  Admission Diagnoses:  Past Medical History  Diagnosis Date  . Diabetes mellitus     type II  . GERD (gastroesophageal reflux disease)   . Hyperlipidemia   . History of myocardial infarction   . Myocardial infarction 01-03-12    '04  . CAD (coronary artery disease) 2004    s/p inferior wall Mi 2004 with PTCA/Stent; s/p CABG 2004  . Arthritis 01-03-12    osteoarthritis-knees  . Neuropathy 01-03-12    bil. feet  . MYOCARDIAL INFARCTION, HX OF 04/19/2007    Qualifier: Diagnosis of  By: Everett Graff    . Heart disease   . Degenerative arthritis   . Polyneuropathy in diabetes(357.2) 08/23/2012  . Foot drop, bilateral 08/23/2012  . Abnormality of gait 08/23/2012  . Pneumonia     hx of  . History of kidney stones   . Anemia     hx of   Discharge Diagnoses:   Active Problems:   Hyponatremia   Postoperative anemia due to acute blood loss   Postop Transfusion  Estimated body mass index is 24.95 kg/(m^2) as calculated from the following:   Height as of this encounter: 5\' 9"  (1.753 m).   Weight as of this encounter: 76.658 kg (169 lb).  Procedure:  Procedure(s) (LRB): LEFT TOTAL KNEE ARTHROPLASTY (Left)   Consults: None  HPI: Steven [name] is a 79 y.o. year old male with end stage OA of his left knee with progressively worsening pain and dysfunction. He has constant pain, with activity and at rest and significant functional deficits with difficulties even with ADLs. He has had extensive non-op management including analgesics, injections of cortisone and viscosupplements, and home exercise program, but remains in significant pain with significant dysfunction. Radiographs show bone on bone arthritis medial and patellofemoral. He presents now for left Total Knee Arthroplasty.   Laboratory  Data: Admission on 09/16/2012  Component Date Value Range Status  . ABO/RH(D) 09/16/2012 A POS   Final  . Antibody Screen 09/16/2012 NEG   Final  . Sample Expiration 09/16/2012 09/19/2012   Final  . Unit Number 09/16/2012 [id number]   Final  . Blood Component Type 09/16/2012 RBC, LR IRR   Final  . Unit division 09/16/2012 00   Final  . Status of Unit 09/16/2012 ISSUED,FINAL   Final  . Transfusion Status 09/16/2012 OK TO TRANSFUSE   Final  . Crossmatch Result 09/16/2012 Compatible   Final  . Unit Number 09/16/2012 [id number]   Final  . Blood Component Type 09/16/2012 RED CELLS,LR   Final  . Unit division 09/16/2012 00   Final  . Status of Unit 09/16/2012 ISSUED,FINAL   Final  . Transfusion Status 09/16/2012 OK TO TRANSFUSE   Final  . Crossmatch Result 09/16/2012 Compatible   Final  . Glucose-Capillary 09/16/2012 182* 70 - 99 mg/dL Final  . Glucose-Capillary 09/16/2012 158* 70 - 99 mg/dL Final  . Comment 1 96/29/5284 Notify RN   Final  . Comment 2 09/16/2012 Documented in Chart   Final  . WBC 09/17/2012 10.5  4.0 - 10.5 K/uL Final  . RBC 09/17/2012 2.85* 4.22 - 5.81 MIL/uL Final  . Hemoglobin 09/17/2012 8.8* 13.0 - 17.0 g/dL Final  . HCT 13/24/4010 25.7* 39.0 - 52.0 % Final  . MCV 09/17/2012 90.2  78.0 - 100.0 fL Final  .  MCH 09/17/2012 30.9  26.0 - 34.0 pg Final  . MCHC 09/17/2012 34.2  30.0 - 36.0 g/dL Final  . RDW 19/14/7829 12.6  11.5 - 15.5 % Final  . Platelets 09/17/2012 203  150 - 400 K/uL Final  . Sodium 09/17/2012 133* 135 - 145 mEq/L Final  . Potassium 09/17/2012 4.3  3.5 - 5.1 mEq/L Final  . Chloride 09/17/2012 98  96 - 112 mEq/L Final  . CO2 09/17/2012 25  19 - 32 mEq/L Final  . Glucose, Bld 09/17/2012 257* 70 - 99 mg/dL Final  . BUN 56/21/3086 18  6 - 23 mg/dL Final  . Creatinine, Ser 09/17/2012 0.79  0.50 - 1.35 mg/dL Final  . Calcium 57/84/6962 9.3  8.4 - 10.5 mg/dL Final  . GFR calc non Af Amer 09/17/2012 83* >90 mL/min Final  . GFR calc Af Amer 09/17/2012  >90  >90 mL/min Final   Comment:                                 The eGFR has been calculated                          using the CKD EPI equation.                          This calculation has not been                          validated in all clinical                          situations.                          eGFR's persistently                          <90 mL/min signify                          possible Chronic Kidney Disease.  . Glucose-Capillary 09/16/2012 193* 70 - 99 mg/dL Final  . Glucose-Capillary 09/16/2012 254* 70 - 99 mg/dL Final  . Glucose-Capillary 09/17/2012 238* 70 - 99 mg/dL Final  . Glucose-Capillary 09/17/2012 240* 70 - 99 mg/dL Final  . Comment 1 95/28/4132 Notify RN   Final  . Comment 2 09/17/2012 Documented in Chart   Final  . WBC 09/18/2012 10.9* 4.0 - 10.5 K/uL Final  . RBC 09/18/2012 2.40* 4.22 - 5.81 MIL/uL Final  . Hemoglobin 09/18/2012 7.1* 13.0 - 17.0 g/dL Final   Comment: RESULT REPEATED AND VERIFIED                          DELTA CHECK NOTED  . HCT 09/18/2012 21.3* 39.0 - 52.0 % Final  . MCV 09/18/2012 88.8  78.0 - 100.0 fL Final  . MCH 09/18/2012 30.0  26.0 - 34.0 pg Final  . MCHC 09/18/2012 33.8  30.0 - 36.0 g/dL Final  . RDW 44/04/270 12.5  11.5 - 15.5 % Final  . Platelets 09/18/2012 167  150 - 400 K/uL Final  . Sodium 09/18/2012 130* 135 - 145 mEq/L Final  . Potassium 09/18/2012 4.1  3.5 - 5.1 mEq/L Final  . Chloride 09/18/2012 97  96 - 112 mEq/L Final  . CO2 09/18/2012 27  19 - 32 mEq/L Final  . Glucose, Bld 09/18/2012 201* 70 - 99 mg/dL Final  . BUN 16/01/9603 20  6 - 23 mg/dL Final  . Creatinine, Ser 09/18/2012 0.85  0.50 - 1.35 mg/dL Final  . Calcium 54/12/8117 8.9  8.4 - 10.5 mg/dL Final  . GFR calc non Af Amer 09/18/2012 81* >90 mL/min Final  . GFR calc Af Amer 09/18/2012 >90  >90 mL/min Final   Comment:                                 The eGFR has been calculated                          using the CKD EPI equation.                           This calculation has not been                          validated in all clinical                          situations.                          eGFR's persistently                          <90 mL/min signify                          possible Chronic Kidney Disease.  . Glucose-Capillary 09/17/2012 191* 70 - 99 mg/dL Final  . Glucose-Capillary 09/17/2012 177* 70 - 99 mg/dL Final  . Comment 1 14/78/2956 Notify RN   Final  . Order Confirmation 09/18/2012 ORDER PROCESSED BY BLOOD BANK   Final  . Order Confirmation 09/18/2012 ORDER PROCESSED BY BLOOD BANK   Final  . Glucose-Capillary 09/18/2012 197* 70 - 99 mg/dL Final  . Glucose-Capillary 09/18/2012 198* 70 - 99 mg/dL Final  . Glucose-Capillary 09/18/2012 177* 70 - 99 mg/dL Final  . WBC 21/30/8657 9.6  4.0 - 10.5 K/uL Final  . RBC 09/19/2012 2.99* 4.22 - 5.81 MIL/uL Final  . Hemoglobin 09/19/2012 8.9* 13.0 - 17.0 g/dL Final   Comment: POST TRANSFUSION SPECIMEN                          DELTA CHECK NOTED  . HCT 09/19/2012 25.4* 39.0 - 52.0 % Final  . MCV 09/19/2012 84.9  78.0 - 100.0 fL Final  . MCH 09/19/2012 29.8  26.0 - 34.0 pg Final  . MCHC 09/19/2012 35.0  30.0 - 36.0 g/dL Final  . RDW 84/69/6295 13.9  11.5 - 15.5 % Final  . Platelets 09/19/2012 158  150 - 400 K/uL Final  . Sodium 09/19/2012 129* 135 - 145 mEq/L Final  . Potassium 09/19/2012 3.7  3.5 - 5.1 mEq/L Final  . Chloride 09/19/2012 94* 96 - 112 mEq/L Final  . CO2 09/19/2012 25  19 - 32 mEq/L Final  . Glucose, Bld 09/19/2012 242*  70 - 99 mg/dL Final  . BUN 16/01/9603 18  6 - 23 mg/dL Final  . Creatinine, Ser 09/19/2012 0.77  0.50 - 1.35 mg/dL Final  . Calcium 54/12/8117 8.6  8.4 - 10.5 mg/dL Final  . GFR calc non Af Amer 09/19/2012 84* >90 mL/min Final  . GFR calc Af Amer 09/19/2012 >90  >90 mL/min Final   Comment:                                 The eGFR has been calculated                          using the CKD EPI equation.                          This  calculation has not been                          validated in all clinical                          situations.                          eGFR's persistently                          <90 mL/min signify                          possible Chronic Kidney Disease.  . Glucose-Capillary 09/18/2012 206* 70 - 99 mg/dL Final  . Comment 1 14/78/2956 Notify RN   Final  . Glucose-Capillary 09/19/2012 218* 70 - 99 mg/dL Final  Hospital Outpatient Visit on 09/03/2012  Component Date Value Range Status  . MRSA, PCR 09/03/2012 NEGATIVE  NEGATIVE Final  . Staphylococcus aureus 09/03/2012 NEGATIVE  NEGATIVE Final   Comment:                                 The Xpert SA Assay (FDA                          approved for NASAL specimens                          in patients over 72 years of age),                          is one component of                          a comprehensive surveillance                          program.  Test performance has                          been validated by [employer]  [facility] for patients greater                          than or equal to 8 year old.                          It is not intended                          to diagnose infection nor to                          guide or monitor treatment.  Marland Kitchen aPTT 09/03/2012 31  24 - 37 seconds Final  . WBC 09/03/2012 7.4  4.0 - 10.5 K/uL Final  . RBC 09/03/2012 3.80* 4.22 - 5.81 MIL/uL Final  . Hemoglobin 09/03/2012 11.7* 13.0 - 17.0 g/dL Final  . HCT 16/01/9603 35.1* 39.0 - 52.0 % Final  . MCV 09/03/2012 92.4  78.0 - 100.0 fL Final  . MCH 09/03/2012 30.8  26.0 - 34.0 pg Final  . MCHC 09/03/2012 33.3  30.0 - 36.0 g/dL Final  . RDW 54/12/8117 13.0  11.5 - 15.5 % Final  . Platelets 09/03/2012 235  150 - 400 K/uL Final  . Sodium 09/03/2012 138  135 - 145 mEq/L Final  . Potassium 09/03/2012 4.7  3.5 - 5.1 mEq/L Final  . Chloride 09/03/2012 103  96 - 112 mEq/L Final  . CO2 09/03/2012 26  19 - 32 mEq/L Final  .  Glucose, Bld 09/03/2012 176* 70 - 99 mg/dL Final  . BUN 14/78/2956 24* 6 - 23 mg/dL Final  . Creatinine, Ser 09/03/2012 0.90  0.50 - 1.35 mg/dL Final  . Calcium 21/30/8657 10.0  8.4 - 10.5 mg/dL Final  . Total Protein 09/03/2012 6.8  6.0 - 8.3 g/dL Final  . Albumin 84/69/6295 3.8  3.5 - 5.2 g/dL Final  . AST 28/41/3244 34  0 - 37 U/L Final  . ALT 09/03/2012 44  0 - 53 U/L Final  . Alkaline Phosphatase 09/03/2012 84  39 - 117 U/L Final  . Total Bilirubin 09/03/2012 0.3  0.3 - 1.2 mg/dL Final  . GFR calc non Af Amer 09/03/2012 79* >90 mL/min Final  . GFR calc Af Amer 09/03/2012 >90  >90 mL/min Final   Comment:                                 The eGFR has been calculated                          using the CKD EPI equation.                          This calculation has not been                          validated in all clinical                          situations.                          eGFR's persistently                          <  90 mL/min signify                          possible Chronic Kidney Disease.  Marland Kitchen Prothrombin Time 09/03/2012 12.5  11.6 - 15.2 seconds Final  . INR 09/03/2012 0.94  0.00 - 1.49 Final  . Color, Urine 09/03/2012 YELLOW  YELLOW Final  . APPearance 09/03/2012 CLEAR  CLEAR Final  . Specific Gravity, Urine 09/03/2012 1.020  1.005 - 1.030 Final  . pH 09/03/2012 5.0  5.0 - 8.0 Final  . Glucose, UA 09/03/2012 500* NEGATIVE mg/dL Final  . Hgb urine dipstick 09/03/2012 NEGATIVE  NEGATIVE Final  . Bilirubin Urine 09/03/2012 NEGATIVE  NEGATIVE Final  . Ketones, ur 09/03/2012 NEGATIVE  NEGATIVE mg/dL Final  . Protein, ur 16/01/9603 NEGATIVE  NEGATIVE mg/dL Final  . Urobilinogen, UA 09/03/2012 0.2  0.0 - 1.0 mg/dL Final  . Nitrite 54/12/8117 NEGATIVE  NEGATIVE Final  . Leukocytes, UA 09/03/2012 NEGATIVE  NEGATIVE Final   MICROSCOPIC NOT DONE ON URINES WITH NEGATIVE PROTEIN, BLOOD, LEUKOCYTES, NITRITE, OR GLUCOSE <1000 mg/dL.  Office Visit on 08/23/2012  Component Date  Value Range Status  . Angio Convert Enzyme 08/23/2012 <14* 14 - 82 U/L Final   **Results verified by repeat testing**  . Rheumatoid Factor 08/23/2012 <7.0  0.0 - 13.9 IU/mL Final  . ANA 08/23/2012 Negative  Negative Final  . IgG (Immunoglobin G), Serum 08/23/2012 695* 700 - 1600 mg/dL Final  . IgA/Immunoglobulin A, Serum 08/23/2012 188  91 - 414 mg/dL Final  . IgM (Immunoglobin M), Srm 08/23/2012 44  40 - 230 mg/dL Final  . Total Protein 08/23/2012 6.5  6.0 - 8.5 g/dL Final  . Albumin SerPl Elph-Mcnc 08/23/2012 4.1  3.2 - 5.6 g/dL Final  . Alpha 1 14/78/2956 0.2  0.1 - 0.4 g/dL Final  . Alpha2 Glob SerPl Elph-Mcnc 08/23/2012 0.7  0.4 - 1.2 g/dL Final  . B-Globulin SerPl Elph-Mcnc 08/23/2012 1.1  0.6 - 1.3 g/dL Final  . Gamma Glob SerPl Elph-Mcnc 08/23/2012 0.5  0.5 - 1.6 g/dL Final  . M Protein SerPl Elph-Mcnc 08/23/2012 Not Observed  Not Observed g/dL Final  . Globulin, Total 08/23/2012 2.4  2.0 - 4.5 g/dL Final  . Albumin/Glob SerPl 08/23/2012 1.8  0.7 - 2.0 Final  . IFE 1 08/23/2012 Comment   Final   An apparent normal immunofixation pattern.  . Please Note 08/23/2012 Comment   Final   Comment: Protein electrophoresis scan will follow via computer, mail, or                          courier delivery.  Marland Kitchen TSH 08/23/2012 1.340  0.450 - 4.500 uIU/mL Final     X-Rays:Dg Chest 2 View  09/03/2012   *RADIOLOGY REPORT*  Clinical Data: Coronary artery disease  CHEST - 2 VIEW  Comparison: Sep 08, 2011.  Findings: Sternotomy wires are noted. Cardiomediastinal silhouette appears normal.  No acute pulmonary disease is noted.  Bony thorax is intact.  IMPRESSION: No acute cardiopulmonary abnormality seen.   Original Report Authenticated By: Lupita Raider.,  M.D.    EKG: Orders placed in visit on 10/02/11  . EKG 12-LEAD     Hospital Course: HELMUT HENNON is a 79 y.o. who was admitted to [facility]. They were brought to the operating room on 09/16/2012 and underwent Procedure(s): LEFT  TOTAL KNEE ARTHROPLASTY.  Patient tolerated the procedure well and was later transferred to  the recovery room and then to the orthopaedic floor for postoperative care.  They were given PO and IV analgesics for pain control following their surgery.  They were given 24 hours of postoperative antibiotics of  Anti-infectives   Start     Dose/Rate Route Frequency Ordered Stop   09/16/12 1730  ceFAZolin (ANCEF) IVPB 1 g/50 mL premix     1 g 100 mL/hr over 30 Minutes Intravenous Every 6 hours 09/16/12 1418 09/17/12 0018   09/16/12 0900  ceFAZolin (ANCEF) IVPB 2 g/50 mL premix     2 g 100 mL/hr over 30 Minutes Intravenous On call to O.R. 09/16/12 1610 09/16/12 1120     and started on DVT prophylaxis in the form of Xarelto.   PT and OT were ordered for total joint protocol.  Discharge planning consulted to help with postop disposition and equipment needs.  Patient had a decent night on the evening of surgery.  They started to get up OOB with therapy on day one walking over 20 feet. Hemovac drain was pulled without difficulty.  Continued to work with therapy into day two.  HGB was down to 7.1. Gave two units of blood that day. Dressing was changed on day two and the incision was healing well.  By day three, the patient had progressed with therapy and meeting their goals.  Incision was healing well.  Patient was seen in rounds and was ready to go tot he SNF for rehab.   Discharge Medications: Prior to Admission medications   Medication Sig Start Date End Date Taking? Authorizing Provider  atorvastatin (LIPITOR) 40 MG tablet Take 40 mg by mouth at bedtime. 06/11/12  Yes Lindley Magnus, MD  docusate sodium (COLACE) 100 MG capsule Take 100 mg by mouth daily.   Yes Historical Provider, MD  glyBURIDE (DIABETA) 5 MG tablet Take 5 mg by mouth daily with breakfast.   Yes Historical Provider, MD  guaiFENesin (MUCINEX) 600 MG 12 hr tablet Take 600 mg by mouth 2 (two) times daily as needed for congestion.   Yes  Historical Provider, MD  Hypertonic Nasal Wash (SINUS RINSE BOTTLE KIT NA) Place 1 application into the nose at bedtime.   Yes Historical Provider, MD  Hypromellose (GENTEAL MILD) 0.2 % SOLN Apply 1 drop to eye 2 (two) times daily as needed (dry eyes).   Yes Historical Provider, MD  ketoconazole (NIZORAL) 2 % shampoo Apply 1 application topically 2 (two) times a week. 08/15/11  Yes Lindley Magnus, MD  Menthol, Topical Analgesic, (ZIMS MAX-FREEZE EX) Apply topically daily.   Yes Historical Provider, MD  metFORMIN (GLUCOPHAGE) 1000 MG tablet Take 1,000 mg by mouth 2 (two) times daily with a meal. 06/11/12  Yes Bruce Romilda Garret, MD  metoprolol succinate (TOPROL-XL) 25 MG 24 hr tablet TAKE 1 TABLET DAILY 09/05/12  Yes Lindley Magnus, MD  Olopatadine HCl (PATADAY) 0.2 % SOLN Apply 1 drop to eye 2 (two) times daily.   Yes Historical Provider, MD  omeprazole (PRILOSEC) 20 MG capsule Take 20 mg by mouth daily.   Yes Historical Provider, MD  ramipril (ALTACE) 10 MG capsule TAKE 1 CAPSULE DAILY 09/05/12  Yes Bruce Romilda Garret, MD  VERAMYST 27.5 MCG/SPRAY nasal spray USE 2 SPRAYS INTO EACH NOSTRIL DAILY AS DIRECTED 08/30/12  Yes Lindley Magnus, MD  acetaminophen (TYLENOL) 325 MG tablet Take 2 tablets (650 mg total) by mouth every 6 (six) hours as needed. 09/19/12   [name] [name], PA-C  bisacodyl (DULCOLAX) 10 MG  suppository Place 1 suppository (10 mg total) rectally daily as needed. 09/19/12   [name] Julien Girt, PA-C  diphenhydrAMINE (BENADRYL) 12.5 MG/5ML elixir Take 5-10 mLs (12.5-25 mg total) by mouth every 4 (four) hours as needed for itching. 09/19/12   [name] [name], PA-C  FreeStyle Unistick II Lancets MISC Use once daily as instructed.     Historical Provider, MD  glucose blood (FREESTYLE LITE) test strip Use once daily as instructed 07/25/11   Lindley Magnus, MD  methocarbamol (ROBAXIN) 500 MG tablet Take 1 tablet (500 mg total) by mouth every 6 (six) hours as needed. 09/19/12   [name] Julien Girt, PA-C    metoCLOPramide (REGLAN) 5 MG tablet Take 1-2 tablets (5-10 mg total) by mouth every 8 (eight) hours as needed (if ondansetron (ZOFRAN) ineffective.). 09/19/12   [name] [name], PA-C  ondansetron (ZOFRAN) 4 MG tablet Take 1 tablet (4 mg total) by mouth every 6 (six) hours as needed for nausea. 09/19/12   [name] [name], PA-C  oxyCODONE (OXY IR/ROXICODONE) 5 MG immediate release tablet Take 1-2 tablets (5-10 mg total) by mouth every 3 (three) hours as needed. 09/19/12   [name] [name], PA-C  polyethylene glycol (MIRALAX / GLYCOLAX) packet Take 17 g by mouth daily as needed. 09/19/12   [name] Julien Girt, PA-C  rivaroxaban (XARELTO) 10 MG TABS tablet Take 1 tablet (10 mg total) by mouth daily with breakfast. Take Xarelto for two and a half more weeks, then discontinue Xarelto. Once the patient has completed the Xarelto, they may resume the 81 mg Aspirin. 09/19/12   [name] [name], PA-C  traMADol (ULTRAM) 50 MG tablet Take 1-2 tablets (50-100 mg total) by mouth every 6 (six) hours as needed (mild pain). 09/19/12   [name] Julien Girt, PA-C    Diet: Cardiac diet and Diabetic diet Activity:WBAT Follow-up:in 2 weeks Disposition - Skilled nursing facility - [facility] Discharged Condition: good   Discharge Orders   Future Appointments Provider Department Dept Phone   12/27/2012 8:45 AM Lindley Magnus, MD [facility] [phone]   Future Orders Complete By Expires     Call MD / Call 911  As directed     Comments:      If you experience chest pain or shortness of breath, CALL 911 and be transported to the hospital emergency room.  If you develope a fever above 101 F, pus (white drainage) or increased drainage or redness at the wound, or calf pain, call your surgeon's office.    Change dressing  As directed     Comments:      Change dressing daily with sterile 4 x 4 inch gauze dressing and apply TED hose. Do not submerge the incision under water.    Constipation  Prevention  As directed     Comments:      Drink plenty of fluids.  Prune juice may be helpful.  You may use a stool softener, such as Colace (over the counter) 100 mg twice a day.  Use MiraLax (over the counter) for constipation as needed.    Diet - low sodium heart healthy  As directed     Diet Carb Modified  As directed     Discharge instructions  As directed     Comments:      Pick up stool softner and laxative for home. Do not submerge incision under water. May shower. Continue to use ice for pain and swelling from surgery. Hip precautions.  Total Hip Protocol.  Take Xarelto for two and a half more  weeks, then discontinue Xarelto.    Do not put a pillow under the knee. Place it under the heel.  As directed     Do not sit on low chairs, stoools or toilet seats, as it may be difficult to get up from low surfaces  As directed     Driving restrictions  As directed     Comments:      No driving until released by the physician.    Increase activity slowly as tolerated  As directed     Lifting restrictions  As directed     Comments:      No lifting until released by the physician.    Patient may shower  As directed     Comments:      You may shower without a dressing once there is no drainage.  Do not wash over the wound.  If drainage remains, do not shower until drainage stops.    TED hose  As directed     Comments:      Use stockings (TED hose) for 3 weeks on both leg(s).  You may remove them at night for sleeping.    Weight bearing as tolerated  As directed         Medication List    STOP taking these medications       ALPHA-LIPOIC ACID 100 MG Tabs     aspirin 81 MG tablet     Cinnamon 500 MG Tabs     diclofenac 75 MG EC tablet  Commonly known as:  VOLTAREN     fish oil-omega-3 fatty acids 1000 MG capsule     multivitamin with minerals Tabs      TAKE these medications       acetaminophen 325 MG tablet  Commonly known as:  TYLENOL  Take 2 tablets (650 mg total)  by mouth every 6 (six) hours as needed.     atorvastatin 40 MG tablet  Commonly known as:  LIPITOR  Take 40 mg by mouth at bedtime.     bisacodyl 10 MG suppository  Commonly known as:  DULCOLAX  Place 1 suppository (10 mg total) rectally daily as needed.     diphenhydrAMINE 12.5 MG/5ML elixir  Commonly known as:  BENADRYL  Take 5-10 mLs (12.5-25 mg total) by mouth every 4 (four) hours as needed for itching.     docusate sodium 100 MG capsule  Commonly known as:  COLACE  Take 100 mg by mouth daily.     FreeStyle Unistick II Lancets Misc  Use once daily as instructed.     GENTEAL MILD 0.2 % Soln  Generic drug:  Hypromellose  Apply 1 drop to eye 2 (two) times daily as needed (dry eyes).     glucose blood test strip  Commonly known as:  FREESTYLE LITE  Use once daily as instructed     glyBURIDE 5 MG tablet  Commonly known as:  DIABETA  Take 5 mg by mouth daily with breakfast.     guaiFENesin 600 MG 12 hr tablet  Commonly known as:  MUCINEX  Take 600 mg by mouth 2 (two) times daily as needed for congestion.     ketoconazole 2 % shampoo  Commonly known as:  NIZORAL  Apply 1 application topically 2 (two) times a week.     metFORMIN 1000 MG tablet  Commonly known as:  GLUCOPHAGE  Take 1,000 mg by mouth 2 (two) times daily with a meal.     methocarbamol 500  MG tablet  Commonly known as:  ROBAXIN  Take 1 tablet (500 mg total) by mouth every 6 (six) hours as needed.     metoCLOPramide 5 MG tablet  Commonly known as:  REGLAN  Take 1-2 tablets (5-10 mg total) by mouth every 8 (eight) hours as needed (if ondansetron (ZOFRAN) ineffective.).     metoprolol succinate 25 MG 24 hr tablet  Commonly known as:  TOPROL-XL  TAKE 1 TABLET DAILY     omeprazole 20 MG capsule  Commonly known as:  PRILOSEC  Take 20 mg by mouth daily.     ondansetron 4 MG tablet  Commonly known as:  ZOFRAN  Take 1 tablet (4 mg total) by mouth every 6 (six) hours as needed for nausea.     oxyCODONE  5 MG immediate release tablet  Commonly known as:  Oxy IR/ROXICODONE  Take 1-2 tablets (5-10 mg total) by mouth every 3 (three) hours as needed.     PATADAY 0.2 % Soln  Generic drug:  Olopatadine HCl  Apply 1 drop to eye 2 (two) times daily.     polyethylene glycol packet  Commonly known as:  MIRALAX / GLYCOLAX  Take 17 g by mouth daily as needed.     ramipril 10 MG capsule  Commonly known as:  ALTACE  TAKE 1 CAPSULE DAILY     rivaroxaban 10 MG Tabs tablet  Commonly known as:  XARELTO  Take 1 tablet (10 mg total) by mouth daily with breakfast. Take Xarelto for two and a half more weeks, then discontinue Xarelto.  Once the patient has completed the Xarelto, they may resume the 81 mg Aspirin.     SINUS RINSE BOTTLE KIT NA  Place 1 application into the nose at bedtime.     traMADol 50 MG tablet  Commonly known as:  ULTRAM  Take 1-2 tablets (50-100 mg total) by mouth every 6 (six) hours as needed (mild pain).     VERAMYST 27.5 MCG/SPRAY nasal spray  Generic drug:  fluticasone  USE 2 SPRAYS INTO EACH NOSTRIL DAILY AS DIRECTED     ZIMS MAX-FREEZE EX  Apply topically daily.           Follow-up Information   Follow up with Loanne Drilling, MD. Schedule an appointment as soon as possible for a visit on 10/02/2012. (Call [phone] tomorrow to make the appointment)    Contact information:   [address] [place] [address] [phone]       Signed: Patrica Duel 09/19/2012, 8:54 AM

## 2012-09-19 NOTE — Progress Notes (Signed)
Patient d/c to [facility] place via [facility], VSS. Call attempted to [facility] place, no answer. Patient in minimal pain. Dressing stained, changed by RN before transport. Three quarter-sized tape blisters noted on medial aspect of steri strips. Steven [name] [place], [place] 09/19/2012 1:29 PM

## 2012-09-19 NOTE — Progress Notes (Signed)
   Subjective: 3 Days Post-Op Procedure(s) (LRB): LEFT TOTAL KNEE ARTHROPLASTY (Left) Patient reports pain as mild.   Patient seen in rounds with Dr. Lequita Halt. Patient is well, and has had no acute complaints or problems Patient is ready to go to rehab today.  Objective: Vital signs in last 24 hours: Temp:  [97.8 F (36.6 C)-99.3 F (37.4 C)] 99.3 F (37.4 C) (06/05 0505) Pulse Rate:  [79-98] 96 (06/05 0505) Resp:  [16-20] 16 (06/05 0505) BP: (107-159)/(64-83) 146/83 mmHg (06/05 0505) SpO2:  [95 %-98 %] 95 % (06/05 0505)  Intake/Output from previous day:  Intake/Output Summary (Last 24 hours) at 09/19/12 0848 Last data filed at 09/19/12 0749  Gross per 24 hour  Intake 5558.34 ml  Output   2715 ml  Net 2843.34 ml    Intake/Output this shift: Total I/O In: -  Out: 375 [Urine:375]  Labs:  Recent Labs  09/17/12 0429 09/18/12 0439 09/19/12 0446  HGB 8.8* 7.1* 8.9*    Recent Labs  09/18/12 0439 09/19/12 0446  WBC 10.9* 9.6  RBC 2.40* 2.99*  HCT 21.3* 25.4*  PLT 167 158    Recent Labs  09/18/12 0439 09/19/12 0446  NA 130* 129*  K 4.1 3.7  CL 97 94*  CO2 27 25  BUN 20 18  CREATININE 0.85 0.77  GLUCOSE 201* 242*  CALCIUM 8.9 8.6   No results found for this basename: LABPT, INR,  in the last 72 hours  EXAM: General - Patient is Alert, Appropriate and Oriented Extremity - Neurovascular intact Sensation intact distally Dorsiflexion/Plantar flexion intact No cellulitis present Incision - clean, dry, no drainage, healing Motor Function - intact, moving foot and toes well on exam.   Assessment/Plan: 3 Days Post-Op Procedure(s) (LRB): LEFT TOTAL KNEE ARTHROPLASTY (Left) Procedure(s) (LRB): LEFT TOTAL KNEE ARTHROPLASTY (Left) Past Medical History  Diagnosis Date  . Diabetes mellitus     type II  . GERD (gastroesophageal reflux disease)   . Hyperlipidemia   . History of myocardial infarction   . Myocardial infarction 01-03-12    '04  . CAD  (coronary artery disease) 2004    s/p inferior wall Mi 2004 with PTCA/Stent; s/p CABG 2004  . Arthritis 01-03-12    osteoarthritis-knees  . Neuropathy 01-03-12    bil. feet  . MYOCARDIAL INFARCTION, HX OF 04/19/2007    Qualifier: Diagnosis of  By: Everett Graff    . Heart disease   . Degenerative arthritis   . Polyneuropathy in diabetes(357.2) 08/23/2012  . Foot drop, bilateral 08/23/2012  . Abnormality of gait 08/23/2012  . Pneumonia     hx of  . History of kidney stones   . Anemia     hx of   Active Problems:   Hyponatremia   Postoperative anemia due to acute blood loss   Postop Transfusion  Estimated body mass index is 24.95 kg/(m^2) as calculated from the following:   Height as of this encounter: 5\' 9"  (1.753 m).   Weight as of this encounter: 76.658 kg (169 lb). Up with therapy Discharge to SNF Diet - Cardiac diet and Diabetic diet Follow up - in 2 weeks Activity - WBAT Disposition - Skilled nursing facility Condition Upon Discharge - Good D/C Meds - See DC Summary DVT Prophylaxis - Xarelto  Turhan Chill 09/19/2012, 8:48 AM

## 2012-09-19 NOTE — Care Management Note (Signed)
    Page 1 of 1   09/19/2012     3:38:36 PM   CARE MANAGEMENT NOTE 09/19/2012  Patient:  MIKI, BLANK   Account Number:  [id number]  Date Initiated:  09/19/2012  Documentation initiated by:  Colleen Can  Subjective/Objective Assessment:   dx left knee total replacemnt     Action/Plan:   SNF rehab   Anticipated DC Date:  09/19/2012   Anticipated DC Plan:  SKILLED NURSING FACILITY  In-house referral  Clinical Social Worker      DC Planning Services  CM consult      Choice offered to / List presented to:             Status of service:  Completed, signed off Medicare Important Message given?  NA - LOS <3 / Initial given by admissions (If response is "NO", the following Medicare IM given date fields will be blank) Date Medicare IM given:   Date Additional Medicare IM given:    Discharge Disposition:  IP REHAB FACILITY  Per UR Regulation:    If discussed at Long Length of Stay Meetings, dates discussed:    Comments:

## 2012-09-19 NOTE — Progress Notes (Signed)
Clinical Social Work Department CLINICAL SOCIAL WORK PLACEMENT NOTE 09/19/2012  Patient:  Steven [name], Steven [name]  Account Number:  [id number] Admit date:  09/16/2012  Clinical Social Worker:  Unk Lightning, LCSW  Date/time:  09/16/2012 03:00 PM  Clinical Social Work is seeking post-discharge placement for this patient at the following level of care:   SKILLED NURSING   (*CSW will update this form in Epic as items are completed)   09/16/2012  Patient/family provided with [facility] Department of Clinical Social Work's list of facilities offering this level of care within the geographic area requested by the patient (or if unable, by the patient's family).  09/16/2012  Patient/family informed of their freedom to choose among providers that offer the needed level of care, that participate in Medicare, Medicaid or managed care program needed by the patient, have an available bed and are willing to accept the patient.  09/16/2012  Patient/family informed of [facility]' ownership interest in [facility], as well as of the fact that they are under no obligation to receive care at this facility.  PASARR submitted to EDS on  PASARR number received from EDS on   FL2 transmitted to all facilities in geographic area requested by pt/family on  09/16/2012 FL2 transmitted to all facilities within larger geographic area on   Patient informed that his/her managed care company has contracts with or will negotiate with  certain facilities, including the following:     Patient/family informed of bed offers received:  09/16/2012 Patient chooses bed at [facility] Physician recommends and patient chooses bed at    Patient to be transferred to [facility] on   Patient to be transferred to facility by   The following physician request were entered in Epic:   Additional Comments:  Cori Razor LCSW [phone]

## 2012-09-19 NOTE — Plan of Care (Signed)
Problem: Consults Goal: Diagnosis- Total Joint Replacement Outcome: Completed/Met Date Met:  09/19/12 Primary Total Knee, Left.

## 2012-09-26 ENCOUNTER — Non-Acute Institutional Stay (SKILLED_NURSING_FACILITY): Payer: Medicare Other | Admitting: Internal Medicine

## 2012-09-26 DIAGNOSIS — M1710 Unilateral primary osteoarthritis, unspecified knee: Secondary | ICD-10-CM

## 2012-09-26 DIAGNOSIS — I25810 Atherosclerosis of coronary artery bypass graft(s) without angina pectoris: Secondary | ICD-10-CM

## 2012-09-26 DIAGNOSIS — D62 Acute posthemorrhagic anemia: Secondary | ICD-10-CM | POA: Diagnosis not present

## 2012-09-26 DIAGNOSIS — E119 Type 2 diabetes mellitus without complications: Secondary | ICD-10-CM

## 2012-10-10 ENCOUNTER — Non-Acute Institutional Stay (SKILLED_NURSING_FACILITY): Payer: Medicare Other | Admitting: Adult Health

## 2012-10-10 DIAGNOSIS — IMO0002 Reserved for concepts with insufficient information to code with codable children: Secondary | ICD-10-CM

## 2012-10-10 DIAGNOSIS — M1710 Unilateral primary osteoarthritis, unspecified knee: Secondary | ICD-10-CM | POA: Diagnosis not present

## 2012-10-10 DIAGNOSIS — E119 Type 2 diabetes mellitus without complications: Secondary | ICD-10-CM

## 2012-10-10 DIAGNOSIS — I498 Other specified cardiac arrhythmias: Secondary | ICD-10-CM

## 2012-10-10 DIAGNOSIS — I10 Essential (primary) hypertension: Secondary | ICD-10-CM | POA: Diagnosis not present

## 2012-10-10 DIAGNOSIS — I471 Supraventricular tachycardia: Secondary | ICD-10-CM

## 2012-10-10 DIAGNOSIS — D62 Acute posthemorrhagic anemia: Secondary | ICD-10-CM | POA: Diagnosis not present

## 2012-10-10 DIAGNOSIS — E785 Hyperlipidemia, unspecified: Secondary | ICD-10-CM

## 2012-10-10 DIAGNOSIS — K5900 Constipation, unspecified: Secondary | ICD-10-CM

## 2012-10-10 DIAGNOSIS — K219 Gastro-esophageal reflux disease without esophagitis: Secondary | ICD-10-CM

## 2012-10-11 ENCOUNTER — Encounter: Payer: Self-pay | Admitting: Adult Health

## 2012-10-11 DIAGNOSIS — D62 Acute posthemorrhagic anemia: Secondary | ICD-10-CM | POA: Insufficient documentation

## 2012-10-11 DIAGNOSIS — I10 Essential (primary) hypertension: Secondary | ICD-10-CM | POA: Insufficient documentation

## 2012-10-11 DIAGNOSIS — K5900 Constipation, unspecified: Secondary | ICD-10-CM | POA: Insufficient documentation

## 2012-10-11 NOTE — Progress Notes (Signed)
  Subjective:    Patient ID: Steven [name], male    DOB: 10/03/1930, 79 y.o.   MRN: [id number]  HPI This is a 79 year old male who has been admitted to [facility] on 09/19/12 from [facility] with Osteoarthritis S/P Left total knee replacement. He has been admitted for a short-term rehabilitation. He has completed SNF rehabilitation and therapy has cleared the patient for discharge. He will have Home health PT and OT.    Review of Systems  Constitutional: Negative.   HENT: Negative.   Eyes: Negative.   Respiratory: Negative for cough and shortness of breath.   Cardiovascular: Negative for leg swelling.  Gastrointestinal: Negative.   Endocrine: Negative.   Genitourinary: Negative.   Neurological: Negative.   Hematological: Negative for adenopathy. Does not bruise/bleed easily.  Psychiatric/Behavioral: Negative.        Objective:   Physical Exam  Nursing note and vitals reviewed. Constitutional: He is oriented to person, place, and time. He appears well-developed and well-nourished.  HENT:  Head: Normocephalic and atraumatic.  Right Ear: External ear normal.  Left Ear: External ear normal.  Nose: Nose normal.  Mouth/Throat: Oropharynx is clear and moist.  Eyes: Conjunctivae and EOM are normal. Pupils are equal, round, and reactive to light.  Neck: Normal range of motion. Neck supple.  Cardiovascular: Normal rate, regular rhythm and normal heart sounds.   Pulmonary/Chest: Effort normal and breath sounds normal.  Abdominal: Soft. Bowel sounds are normal.  Musculoskeletal: Normal range of motion. He exhibits no edema and no tenderness.  Neurological: He is alert and oriented to person, place, and time.  Skin: Skin is warm and dry.  Psychiatric: He has a normal mood and affect. His behavior is normal. Judgment and thought content normal.    LABS: 10/02/12  Wbc 7.0  hgb 8.4  hct 25.2 09/20/12  Wbc 8.2 hgb 8.8  hct 25.4  NA 132 K 4.2  Glucose 200  BUN 16  Creatinine 0.79   Calcium 8.3    Medications reviewed per [facility]    Assessment & Plan:   Acute blood loss anemia - stable  Essential hypertension, benign - well-controlled  Constipation - stable  OA (osteoarthritis) of knee S/P Left Total Knee Replacement - for Home health PT and OT  SVT (supraventricular tachycardia) - rate-controlled  DIABETES MELLITUS, TYPE II - well-controlled  HYPERLIPIDEMIA - stable  GERD - stable   I have filled out patient's discharge paperwork and written prescriptions. Patient will have Home health PT and OT   Total discharge time: Greater than 30 mins Discharge time involved coordination of the discharge process with social worker, nursing staff and therapy department. Medical justification for Home health services verified.    CPT CODE:  40981

## 2012-10-14 DIAGNOSIS — Z5189 Encounter for other specified aftercare: Secondary | ICD-10-CM | POA: Diagnosis not present

## 2012-10-14 DIAGNOSIS — M25519 Pain in unspecified shoulder: Secondary | ICD-10-CM | POA: Diagnosis not present

## 2012-10-14 DIAGNOSIS — E119 Type 2 diabetes mellitus without complications: Secondary | ICD-10-CM | POA: Diagnosis not present

## 2012-10-14 DIAGNOSIS — Z471 Aftercare following joint replacement surgery: Secondary | ICD-10-CM | POA: Diagnosis not present

## 2012-10-14 DIAGNOSIS — Z96659 Presence of unspecified artificial knee joint: Secondary | ICD-10-CM | POA: Diagnosis not present

## 2012-10-16 DIAGNOSIS — Z96659 Presence of unspecified artificial knee joint: Secondary | ICD-10-CM | POA: Diagnosis not present

## 2012-10-16 DIAGNOSIS — Z471 Aftercare following joint replacement surgery: Secondary | ICD-10-CM | POA: Diagnosis not present

## 2012-10-16 DIAGNOSIS — M25519 Pain in unspecified shoulder: Secondary | ICD-10-CM | POA: Diagnosis not present

## 2012-10-16 DIAGNOSIS — E119 Type 2 diabetes mellitus without complications: Secondary | ICD-10-CM | POA: Diagnosis not present

## 2012-10-16 DIAGNOSIS — Z5189 Encounter for other specified aftercare: Secondary | ICD-10-CM | POA: Diagnosis not present

## 2012-10-16 NOTE — Progress Notes (Signed)
Patient ID: Steven [name], male   DOB: 1930/06/16, 79 y.o.   MRN: [id number]        HISTORY & PHYSICAL  DATE: 09/26/2012   FACILITY: [facility]  LEVEL OF CARE: SNF (31)  ALLERGIES:  No Known Allergies  CHIEF COMPLAINT:  Manage left knee osteoarthritis, diabetes mellitus, and coronary artery disease.    HISTORY OF PRESENT ILLNESS:  The patient is a 79 year-old, Caucasian male.   KNEE OSTEOARTHRITIS: Patient had a history of pain and functional disability in the knee due to end-stage osteoarthritis and has failed nonsurgical conservative treatments. Patient had worsening of pain with activity and weight bearing, pain that interfered with activities of daily living & pain with passive range of motion. Radiographs showed bone-on-bone arthritis of the medial and patellofemoral compartments.   Therefore patient underwent total knee arthroplasty and tolerated the procedure well. Patient is admitted to this facility for sort short-term rehabilitation. Patient denies knee pain.    DM:pt's DM remains stable.  Pt denies polyuria, polydipsia, polyphagia, changes in vision or hypoglycemic episodes.  No complications noted from the medication presently being used.  Last hemoglobin A1c is: A recent  hemoglobin A1C is not available.   CAD: The angina has been stable. The patient denies dyspnea on exertion, orthopnea, pedal edema, palpitations and paroxysmal nocturnal dyspnea. No complications noted from the medication presently being used.    PAST MEDICAL HISTORY :  Past Medical History  Diagnosis Date  . Diabetes mellitus     type II  . GERD (gastroesophageal reflux disease)   . Hyperlipidemia   . History of myocardial infarction   . Myocardial infarction 01-03-12    '04  . CAD (coronary artery disease) 2004    s/p inferior wall Mi 2004 with PTCA/Stent; s/p CABG 2004  . Arthritis 01-03-12    osteoarthritis-knees  . Neuropathy 01-03-12    bil. feet  . MYOCARDIAL INFARCTION, HX  OF 04/19/2007    Qualifier: Diagnosis of  By: Everett Graff    . Heart disease   . Degenerative arthritis   . Polyneuropathy in diabetes(357.2) 08/23/2012  . Foot drop, bilateral 08/23/2012  . Abnormality of gait 08/23/2012  . Pneumonia     hx of  . History of kidney stones   . Anemia     hx of    PAST SURGICAL HISTORY: Past Surgical History  Procedure Laterality Date  . Coronary artery bypass graft  2004    LIMA to LAD; SVG to ramus intermedius; SVG to PDA/PLSA  . Tonsillectomy  as child  . Colonoscopy    . Laminectomy  09/08/2011    Procedure: LUMBAR LAMINECTOMY FOR TUMOR;  Surgeon: Maeola Harman, MD;  Location: [facility];  Service: Neurosurgery;  Laterality: Left;  Left Thoracic twelve-Lumbar one transpedicular resection of epidural mass  . Cardiac catheterization  01-03-12    '04  . Total knee arthroplasty  01/15/2012    Procedure: TOTAL KNEE ARTHROPLASTY;  Surgeon: Loanne Drilling, MD;  Location: [facility] ORS;  Service: Orthopedics;  Laterality: Right;  . Back surgery  2013  . Total knee arthroplasty Left 09/16/2012    Procedure: LEFT TOTAL KNEE ARTHROPLASTY;  Surgeon: Loanne Drilling, MD;  Location: [facility] ORS;  Service: Orthopedics;  Laterality: Left;    SOCIAL HISTORY:  reports that he has never smoked. He has never used smokeless tobacco. He reports that he does not drink alcohol or use illicit drugs.  FAMILY HISTORY:  Family History  Problem Relation Age of Onset  . Cirrhosis Mother     etiology unclear  . Hypertension Father   . Stroke Father   . Cancer Sister     breast  . Stroke Maternal Grandmother   . Heart disease Maternal Grandfather     MI  . Stroke Paternal Grandmother   . Stroke Paternal Grandfather   . Anesthesia problems Neg Hx   . Hypotension Neg Hx   . Malignant hyperthermia Neg Hx   . Pseudochol deficiency Neg Hx     CURRENT MEDICATIONS: Reviewed per [facility]  REVIEW OF SYSTEMS:   SKIN:  complains of a rash and itching on his back  See HPI otherwise 14  point ROS is negative.  PHYSICAL EXAMINATION  VS:  T 97.6       P 96       RR 20       BP 119/68       POX 97% room air        WT (Lb)  GENERAL: no acute distress, normal body habitus SKIN: warm & dry, no suspicious lesions or rashes, no excessive dryness, left knee incision clean and dry, Steri-Strips are in place, there are several blood blisters; back: no rash or scratch marks noted EYES: conjunctivae normal, sclerae normal, normal eye lids MOUTH/THROAT: lips without lesions,no lesions in the mouth,tongue is without lesions,uvula elevates in midline NECK: supple, trachea midline, no neck masses, no thyroid tenderness, no thyromegaly LYMPHATICS: no LAN in the neck, no supraclavicular LAN RESPIRATORY: breathing is even & unlabored, BS CTAB CARDIAC: RRR, no murmur,no extra heart sounds EDEMA/VARICOSITIES: left lower extremity has +2 edema GI:  ABDOMEN: abdomen soft, normal BS, no masses, no tenderness  LIVER/SPLEEN: no hepatomegaly, no splenomegaly MUSCULOSKELETAL: HEAD: normal to inspection & palpation BACK: no kyphosis, scoliosis or spinal processes tenderness EXTREMITIES: LEFT UPPER EXTREMITY: strength intact, range of motion moderate  RIGHT UPPER EXTREMITY:  full range of motion, normal strength & tone LEFT LOWER EXTREMITY: strength intact, range of motion not tested due to surgery  RIGHT LOWER EXTREMITY:  full range of motion, normal strength & tone PSYCHIATRIC: the patient is alert & oriented to person, affect & behavior appropriate  LABS/RADIOLOGY: Hemoglobin 8.8, MCV 86.7, otherwise CBC normal.   Glucose 200, otherwise BMP normal.  MRSA by PCR negative.    Staph aureus by PCR negative.    PTT 31, PT 12.5, INR 0.94.    Liver profile normal.    Urinalysis negative.   Chest x-ray:  No acute disease.   ASSESSMENT/PLAN:  Left knee osteoarthritis.  Status post left total knee arthroplasty.  Continue rehabilitation.   Coronary artery disease.  Stable.    Diabetes  mellitus.  Continue current medications.    Acute blood loss anemia.  Hemoglobin stable.  Iron was started.  Recheck hemoglobin pending.    Pruritus of the back.  Dry skin.  No rash noted.  We will request to apply moisturizing cream.   I have reviewed patient's medical records received at admission/from hospitalization.  CPT CODE: 65784

## 2012-10-17 DIAGNOSIS — Z471 Aftercare following joint replacement surgery: Secondary | ICD-10-CM | POA: Diagnosis not present

## 2012-10-17 DIAGNOSIS — Z96659 Presence of unspecified artificial knee joint: Secondary | ICD-10-CM | POA: Diagnosis not present

## 2012-10-17 DIAGNOSIS — Z5189 Encounter for other specified aftercare: Secondary | ICD-10-CM | POA: Diagnosis not present

## 2012-10-17 DIAGNOSIS — M25519 Pain in unspecified shoulder: Secondary | ICD-10-CM | POA: Diagnosis not present

## 2012-10-17 DIAGNOSIS — E119 Type 2 diabetes mellitus without complications: Secondary | ICD-10-CM | POA: Diagnosis not present

## 2012-10-18 DIAGNOSIS — M25519 Pain in unspecified shoulder: Secondary | ICD-10-CM | POA: Diagnosis not present

## 2012-10-18 DIAGNOSIS — E119 Type 2 diabetes mellitus without complications: Secondary | ICD-10-CM | POA: Diagnosis not present

## 2012-10-18 DIAGNOSIS — Z5189 Encounter for other specified aftercare: Secondary | ICD-10-CM | POA: Diagnosis not present

## 2012-10-18 DIAGNOSIS — Z96659 Presence of unspecified artificial knee joint: Secondary | ICD-10-CM | POA: Diagnosis not present

## 2012-10-18 DIAGNOSIS — Z471 Aftercare following joint replacement surgery: Secondary | ICD-10-CM | POA: Diagnosis not present

## 2012-10-21 DIAGNOSIS — Z471 Aftercare following joint replacement surgery: Secondary | ICD-10-CM | POA: Diagnosis not present

## 2012-10-21 DIAGNOSIS — Z96659 Presence of unspecified artificial knee joint: Secondary | ICD-10-CM | POA: Diagnosis not present

## 2012-10-21 DIAGNOSIS — Z5189 Encounter for other specified aftercare: Secondary | ICD-10-CM | POA: Diagnosis not present

## 2012-10-21 DIAGNOSIS — M25519 Pain in unspecified shoulder: Secondary | ICD-10-CM | POA: Diagnosis not present

## 2012-10-21 DIAGNOSIS — E119 Type 2 diabetes mellitus without complications: Secondary | ICD-10-CM | POA: Diagnosis not present

## 2012-10-23 DIAGNOSIS — E119 Type 2 diabetes mellitus without complications: Secondary | ICD-10-CM | POA: Diagnosis not present

## 2012-10-23 DIAGNOSIS — Z5189 Encounter for other specified aftercare: Secondary | ICD-10-CM | POA: Diagnosis not present

## 2012-10-23 DIAGNOSIS — Z96659 Presence of unspecified artificial knee joint: Secondary | ICD-10-CM | POA: Diagnosis not present

## 2012-10-23 DIAGNOSIS — Z471 Aftercare following joint replacement surgery: Secondary | ICD-10-CM | POA: Diagnosis not present

## 2012-10-23 DIAGNOSIS — M25519 Pain in unspecified shoulder: Secondary | ICD-10-CM | POA: Diagnosis not present

## 2012-10-25 DIAGNOSIS — Z5189 Encounter for other specified aftercare: Secondary | ICD-10-CM | POA: Diagnosis not present

## 2012-10-25 DIAGNOSIS — Z471 Aftercare following joint replacement surgery: Secondary | ICD-10-CM | POA: Diagnosis not present

## 2012-10-25 DIAGNOSIS — E119 Type 2 diabetes mellitus without complications: Secondary | ICD-10-CM | POA: Diagnosis not present

## 2012-10-25 DIAGNOSIS — M25519 Pain in unspecified shoulder: Secondary | ICD-10-CM | POA: Diagnosis not present

## 2012-10-25 DIAGNOSIS — Z96659 Presence of unspecified artificial knee joint: Secondary | ICD-10-CM | POA: Diagnosis not present

## 2012-10-28 DIAGNOSIS — E119 Type 2 diabetes mellitus without complications: Secondary | ICD-10-CM | POA: Diagnosis not present

## 2012-10-28 DIAGNOSIS — Z96659 Presence of unspecified artificial knee joint: Secondary | ICD-10-CM | POA: Diagnosis not present

## 2012-10-28 DIAGNOSIS — Z471 Aftercare following joint replacement surgery: Secondary | ICD-10-CM | POA: Diagnosis not present

## 2012-10-28 DIAGNOSIS — M25519 Pain in unspecified shoulder: Secondary | ICD-10-CM | POA: Diagnosis not present

## 2012-10-28 DIAGNOSIS — Z5189 Encounter for other specified aftercare: Secondary | ICD-10-CM | POA: Diagnosis not present

## 2012-10-30 DIAGNOSIS — Z96659 Presence of unspecified artificial knee joint: Secondary | ICD-10-CM | POA: Diagnosis not present

## 2012-10-31 DIAGNOSIS — E119 Type 2 diabetes mellitus without complications: Secondary | ICD-10-CM | POA: Diagnosis not present

## 2012-10-31 DIAGNOSIS — Z471 Aftercare following joint replacement surgery: Secondary | ICD-10-CM | POA: Diagnosis not present

## 2012-10-31 DIAGNOSIS — R040 Epistaxis: Secondary | ICD-10-CM | POA: Diagnosis not present

## 2012-10-31 DIAGNOSIS — M25519 Pain in unspecified shoulder: Secondary | ICD-10-CM | POA: Diagnosis not present

## 2012-10-31 DIAGNOSIS — Z5189 Encounter for other specified aftercare: Secondary | ICD-10-CM | POA: Diagnosis not present

## 2012-10-31 DIAGNOSIS — Z96659 Presence of unspecified artificial knee joint: Secondary | ICD-10-CM | POA: Diagnosis not present

## 2012-11-01 ENCOUNTER — Other Ambulatory Visit: Payer: Self-pay | Admitting: Internal Medicine

## 2012-11-01 DIAGNOSIS — M25519 Pain in unspecified shoulder: Secondary | ICD-10-CM | POA: Diagnosis not present

## 2012-11-01 DIAGNOSIS — Z96659 Presence of unspecified artificial knee joint: Secondary | ICD-10-CM | POA: Diagnosis not present

## 2012-11-01 DIAGNOSIS — Z471 Aftercare following joint replacement surgery: Secondary | ICD-10-CM | POA: Diagnosis not present

## 2012-11-01 DIAGNOSIS — Z5189 Encounter for other specified aftercare: Secondary | ICD-10-CM | POA: Diagnosis not present

## 2012-11-01 DIAGNOSIS — E119 Type 2 diabetes mellitus without complications: Secondary | ICD-10-CM | POA: Diagnosis not present

## 2012-11-04 DIAGNOSIS — E119 Type 2 diabetes mellitus without complications: Secondary | ICD-10-CM | POA: Diagnosis not present

## 2012-11-04 DIAGNOSIS — Z96659 Presence of unspecified artificial knee joint: Secondary | ICD-10-CM | POA: Diagnosis not present

## 2012-11-04 DIAGNOSIS — Z471 Aftercare following joint replacement surgery: Secondary | ICD-10-CM | POA: Diagnosis not present

## 2012-11-04 DIAGNOSIS — R040 Epistaxis: Secondary | ICD-10-CM | POA: Diagnosis not present

## 2012-11-04 DIAGNOSIS — J3489 Other specified disorders of nose and nasal sinuses: Secondary | ICD-10-CM | POA: Diagnosis not present

## 2012-11-04 DIAGNOSIS — M25519 Pain in unspecified shoulder: Secondary | ICD-10-CM | POA: Diagnosis not present

## 2012-11-04 DIAGNOSIS — Z5189 Encounter for other specified aftercare: Secondary | ICD-10-CM | POA: Diagnosis not present

## 2012-11-05 DIAGNOSIS — E119 Type 2 diabetes mellitus without complications: Secondary | ICD-10-CM | POA: Diagnosis not present

## 2012-11-05 DIAGNOSIS — Z5189 Encounter for other specified aftercare: Secondary | ICD-10-CM | POA: Diagnosis not present

## 2012-11-05 DIAGNOSIS — Z471 Aftercare following joint replacement surgery: Secondary | ICD-10-CM | POA: Diagnosis not present

## 2012-11-05 DIAGNOSIS — M25519 Pain in unspecified shoulder: Secondary | ICD-10-CM | POA: Diagnosis not present

## 2012-11-05 DIAGNOSIS — Z96659 Presence of unspecified artificial knee joint: Secondary | ICD-10-CM | POA: Diagnosis not present

## 2012-11-06 DIAGNOSIS — Z5189 Encounter for other specified aftercare: Secondary | ICD-10-CM | POA: Diagnosis not present

## 2012-11-06 DIAGNOSIS — Z471 Aftercare following joint replacement surgery: Secondary | ICD-10-CM | POA: Diagnosis not present

## 2012-11-06 DIAGNOSIS — M25519 Pain in unspecified shoulder: Secondary | ICD-10-CM | POA: Diagnosis not present

## 2012-11-06 DIAGNOSIS — Z96659 Presence of unspecified artificial knee joint: Secondary | ICD-10-CM | POA: Diagnosis not present

## 2012-11-06 DIAGNOSIS — E119 Type 2 diabetes mellitus without complications: Secondary | ICD-10-CM | POA: Diagnosis not present

## 2012-11-08 DIAGNOSIS — Z471 Aftercare following joint replacement surgery: Secondary | ICD-10-CM | POA: Diagnosis not present

## 2012-11-08 DIAGNOSIS — Z5189 Encounter for other specified aftercare: Secondary | ICD-10-CM | POA: Diagnosis not present

## 2012-11-08 DIAGNOSIS — M25519 Pain in unspecified shoulder: Secondary | ICD-10-CM | POA: Diagnosis not present

## 2012-11-08 DIAGNOSIS — Z96659 Presence of unspecified artificial knee joint: Secondary | ICD-10-CM | POA: Diagnosis not present

## 2012-11-08 DIAGNOSIS — E119 Type 2 diabetes mellitus without complications: Secondary | ICD-10-CM | POA: Diagnosis not present

## 2012-11-11 DIAGNOSIS — E119 Type 2 diabetes mellitus without complications: Secondary | ICD-10-CM | POA: Diagnosis not present

## 2012-11-11 DIAGNOSIS — Z96659 Presence of unspecified artificial knee joint: Secondary | ICD-10-CM | POA: Diagnosis not present

## 2012-11-11 DIAGNOSIS — Z5189 Encounter for other specified aftercare: Secondary | ICD-10-CM | POA: Diagnosis not present

## 2012-11-11 DIAGNOSIS — M25519 Pain in unspecified shoulder: Secondary | ICD-10-CM | POA: Diagnosis not present

## 2012-11-11 DIAGNOSIS — Z471 Aftercare following joint replacement surgery: Secondary | ICD-10-CM | POA: Diagnosis not present

## 2012-11-12 DIAGNOSIS — Z5189 Encounter for other specified aftercare: Secondary | ICD-10-CM | POA: Diagnosis not present

## 2012-11-12 DIAGNOSIS — Z471 Aftercare following joint replacement surgery: Secondary | ICD-10-CM | POA: Diagnosis not present

## 2012-11-12 DIAGNOSIS — Z96659 Presence of unspecified artificial knee joint: Secondary | ICD-10-CM | POA: Diagnosis not present

## 2012-11-12 DIAGNOSIS — M25519 Pain in unspecified shoulder: Secondary | ICD-10-CM | POA: Diagnosis not present

## 2012-11-12 DIAGNOSIS — E119 Type 2 diabetes mellitus without complications: Secondary | ICD-10-CM | POA: Diagnosis not present

## 2012-11-13 ENCOUNTER — Ambulatory Visit (INDEPENDENT_AMBULATORY_CARE_PROVIDER_SITE_OTHER): Payer: Medicare Other | Admitting: Family Medicine

## 2012-11-13 ENCOUNTER — Encounter: Payer: Self-pay | Admitting: Family Medicine

## 2012-11-13 VITALS — BP 150/80 | HR 97 | Temp 98.5°F | Wt 162.0 lb

## 2012-11-13 DIAGNOSIS — S0091XA Abrasion of unspecified part of head, initial encounter: Secondary | ICD-10-CM

## 2012-11-13 DIAGNOSIS — S51009A Unspecified open wound of unspecified elbow, initial encounter: Secondary | ICD-10-CM | POA: Diagnosis not present

## 2012-11-13 DIAGNOSIS — IMO0002 Reserved for concepts with insufficient information to code with codable children: Secondary | ICD-10-CM

## 2012-11-13 DIAGNOSIS — S51011A Laceration without foreign body of right elbow, initial encounter: Secondary | ICD-10-CM

## 2012-11-13 DIAGNOSIS — S40811A Abrasion of right upper arm, initial encounter: Secondary | ICD-10-CM

## 2012-11-13 NOTE — Progress Notes (Signed)
  Subjective:    Patient ID: Steven [name], male    DOB: [birth year], 79 y.o.   MRN: [id number]  HPI Here to follow up on injuries from a fall at home yesterday. He was in his laundry room and started to turn around when he thinks he lost his balance and fell. No LOC or chest pain or SOB. He usually uses a cane around the house but did not use this yesterday. Since then he has been sore around the right eyebrow but denies any HA or dizziness or nausea or blurred vision. They applied Bandaids to all the areas of bleeding.    Review of Systems  Constitutional: Negative.   HENT: Negative.   Eyes: Negative.   Respiratory: Negative.   Cardiovascular: Negative.   Skin: Positive for wound.  Neurological: Negative.        Objective:   Physical Exam  Constitutional: He is oriented to person, place, and time. He appears well-developed and well-nourished. No distress.  Using his cane  HENT:  Right Ear: External ear normal.  Left Ear: External ear normal.  Nose: Nose normal.  Mouth/Throat: Oropharynx is clear and moist.  There is a laceration over the right eyebrow on the superior orbital rim, but no crepitus. EOM are full. Small abrasion in the right zygoma.   Eyes: Conjunctivae and EOM are normal. Pupils are equal, round, and reactive to light. Right eye exhibits no discharge. Left eye exhibits no discharge. No scleral icterus.  Neck: Normal range of motion. Neck supple. No thyromegaly present.  Cardiovascular: Normal rate, regular rhythm, normal heart sounds and intact distal pulses.   Lymphadenopathy:    He has no cervical adenopathy.  Neurological: He is alert and oriented to person, place, and time. He has normal reflexes. No cranial nerve deficit. He exhibits normal muscle tone. Coordination normal.  Skin:  Numerous small abrasions or skin tears on the right elbow and forearm and the left hand           Assessment & Plan:  The areas were cleaned and dressed with Neosporin,  Telfa, and Bandaids. He will do this at home daily for a week. Recheck prn u

## 2012-11-14 DIAGNOSIS — E119 Type 2 diabetes mellitus without complications: Secondary | ICD-10-CM | POA: Diagnosis not present

## 2012-11-14 DIAGNOSIS — Z471 Aftercare following joint replacement surgery: Secondary | ICD-10-CM | POA: Diagnosis not present

## 2012-11-14 DIAGNOSIS — Z96659 Presence of unspecified artificial knee joint: Secondary | ICD-10-CM | POA: Diagnosis not present

## 2012-11-14 DIAGNOSIS — Z5189 Encounter for other specified aftercare: Secondary | ICD-10-CM | POA: Diagnosis not present

## 2012-11-14 DIAGNOSIS — M25519 Pain in unspecified shoulder: Secondary | ICD-10-CM | POA: Diagnosis not present

## 2012-11-15 DIAGNOSIS — E119 Type 2 diabetes mellitus without complications: Secondary | ICD-10-CM | POA: Diagnosis not present

## 2012-11-15 DIAGNOSIS — Z5189 Encounter for other specified aftercare: Secondary | ICD-10-CM | POA: Diagnosis not present

## 2012-11-15 DIAGNOSIS — M25519 Pain in unspecified shoulder: Secondary | ICD-10-CM | POA: Diagnosis not present

## 2012-11-15 DIAGNOSIS — Z96659 Presence of unspecified artificial knee joint: Secondary | ICD-10-CM | POA: Diagnosis not present

## 2012-11-15 DIAGNOSIS — Z471 Aftercare following joint replacement surgery: Secondary | ICD-10-CM | POA: Diagnosis not present

## 2012-11-18 DIAGNOSIS — R040 Epistaxis: Secondary | ICD-10-CM | POA: Diagnosis not present

## 2012-11-19 ENCOUNTER — Telehealth: Payer: Self-pay | Admitting: Internal Medicine

## 2012-11-19 ENCOUNTER — Ambulatory Visit: Payer: Medicare Other | Admitting: Family Medicine

## 2012-11-19 NOTE — Telephone Encounter (Signed)
PT called and wanted to follow up with Dr. Clent Ridges following his fall last week. He states that he is recovering fine, but is now wondering why he took that fall. He would like to know if he needs to follow up with a cardiologist, neurologist, ect? Would you be willing to see him tomorrow or Friday to follow up with him or should he see another provider while your unavailable? Please assist.

## 2012-11-19 NOTE — Telephone Encounter (Signed)
I do not think this a heart issue at all. It sounded like he lost his balance, so he could see Neurology. He can call Dr. Anne Hahn to get an appt for this soon

## 2012-11-20 ENCOUNTER — Telehealth: Payer: Self-pay | Admitting: Internal Medicine

## 2012-11-20 DIAGNOSIS — R269 Unspecified abnormalities of gait and mobility: Secondary | ICD-10-CM

## 2012-11-20 DIAGNOSIS — Z96659 Presence of unspecified artificial knee joint: Secondary | ICD-10-CM | POA: Diagnosis not present

## 2012-11-20 NOTE — Telephone Encounter (Signed)
Per Dr. Clent Ridges pt is already established with Dr Anne Hahn so he pt does not need a referral.  Left message for pt to call back

## 2012-11-20 NOTE — Telephone Encounter (Signed)
Pt called back and needs referral.  Referral order placed.  Dr Clent Ridges would like it under Dr Cato Mulligan name since he is PCP

## 2012-11-20 NOTE — Telephone Encounter (Signed)
PT is requesting a referral to see a neurologist (Dr. Stephanie Acre) in regards to his balance. He states that he took a bad fall, and he believes it's due to his balance. Please assist.

## 2012-11-20 NOTE — Telephone Encounter (Signed)
I left voice message with the below information. 

## 2012-11-25 DIAGNOSIS — M25519 Pain in unspecified shoulder: Secondary | ICD-10-CM | POA: Diagnosis not present

## 2012-11-29 ENCOUNTER — Ambulatory Visit: Payer: Medicare Other | Admitting: Neurology

## 2012-12-05 ENCOUNTER — Encounter: Payer: Self-pay | Admitting: Internal Medicine

## 2012-12-05 ENCOUNTER — Ambulatory Visit (INDEPENDENT_AMBULATORY_CARE_PROVIDER_SITE_OTHER): Payer: Medicare Other | Admitting: Internal Medicine

## 2012-12-05 VITALS — BP 142/70 | HR 69 | Ht 68.0 in | Wt 164.0 lb

## 2012-12-05 DIAGNOSIS — I25810 Atherosclerosis of coronary artery bypass graft(s) without angina pectoris: Secondary | ICD-10-CM | POA: Diagnosis not present

## 2012-12-05 DIAGNOSIS — E785 Hyperlipidemia, unspecified: Secondary | ICD-10-CM

## 2012-12-05 DIAGNOSIS — I471 Supraventricular tachycardia, unspecified: Secondary | ICD-10-CM

## 2012-12-05 DIAGNOSIS — I498 Other specified cardiac arrhythmias: Secondary | ICD-10-CM

## 2012-12-05 DIAGNOSIS — R9431 Abnormal electrocardiogram [ECG] [EKG]: Secondary | ICD-10-CM | POA: Diagnosis not present

## 2012-12-05 DIAGNOSIS — R55 Syncope and collapse: Secondary | ICD-10-CM

## 2012-12-05 DIAGNOSIS — E119 Type 2 diabetes mellitus without complications: Secondary | ICD-10-CM

## 2012-12-05 DIAGNOSIS — M216X9 Other acquired deformities of unspecified foot: Secondary | ICD-10-CM

## 2012-12-05 DIAGNOSIS — M21371 Foot drop, right foot: Secondary | ICD-10-CM

## 2012-12-05 DIAGNOSIS — I10 Essential (primary) hypertension: Secondary | ICD-10-CM

## 2012-12-05 DIAGNOSIS — I2510 Atherosclerotic heart disease of native coronary artery without angina pectoris: Secondary | ICD-10-CM

## 2012-12-05 DIAGNOSIS — I6529 Occlusion and stenosis of unspecified carotid artery: Secondary | ICD-10-CM

## 2012-12-05 DIAGNOSIS — Z951 Presence of aortocoronary bypass graft: Secondary | ICD-10-CM

## 2012-12-05 NOTE — Patient Instructions (Addendum)
Your physician has requested that you have a lexiscan myoview. For further information please visit [email or web address] Please follow instruction sheet, as given.  Your physician recommends that you schedule a follow-up appointment in: 2 weeks.

## 2012-12-06 ENCOUNTER — Telehealth: Payer: Self-pay | Admitting: Internal Medicine

## 2012-12-06 NOTE — Telephone Encounter (Signed)
Left message with wife to have him call to r/s appointment, she said that he will call back today

## 2012-12-07 ENCOUNTER — Encounter: Payer: Self-pay | Admitting: Internal Medicine

## 2012-12-07 NOTE — Progress Notes (Signed)
OFFICE NOTE  Chief Complaint:  New patient, fall, ?syncope  Primary Care Physician: Judie Petit, MD  HPI:  Steven [name] is a pleasant 79 year old male with history of coronary artery disease and coronary artery bypass grafting in 2004 after an inferior MI. He also has had a stent. He hasn't has diabetes, neuropathy, dyslipidemia, GERD and foot drop on the left for which he wears a brace. He has been followed by Dr. Huston Foley at with our cardiology. He saw her last in June of 2013 but reported since she is not seeing patients regularly in the office that he wished to see a different cardiologist. He has not had a stress test and a number of years. Recently he describes an episode where he was in his kitchen and felt a sharp pain in his left chest. This caused him to immediately turn his head violently to the right and he felt dizzy and then passed out. He does not remember falling but did catch himself and was confused when he was found fairly shortly afterwards on the floor. He does report he had an episode similar to this about one year ago where he fell backwards but was caught and did not totally pass out. He denies his heart racing or any periods of awareness of dizziness or blurred vision prior to the event.  The chest pain was described as sharp and sudden in the left chest like he was "punched" in the left chest. He has had no further chest pain or events.  PMHx:  Past Medical History  Diagnosis Date  . Diabetes mellitus     type II  . GERD (gastroesophageal reflux disease)   . Hyperlipidemia   . History of myocardial infarction   . Myocardial infarction 01-03-12    '04  . CAD (coronary artery disease) 2004    s/p inferior wall Mi 2004 with PTCA/Stent; s/p CABG 2004  . Arthritis 01-03-12    osteoarthritis-knees  . Neuropathy 01-03-12    bil. feet  . MYOCARDIAL INFARCTION, HX OF 04/19/2007    Qualifier: Diagnosis of  By: Everett Graff    . Heart disease   . Degenerative  arthritis   . Polyneuropathy in diabetes(357.2) 08/23/2012  . Foot drop, bilateral 08/23/2012  . Abnormality of gait 08/23/2012  . Pneumonia     hx of  . History of kidney stones   . Anemia     hx of    Past Surgical History  Procedure Laterality Date  . Coronary artery bypass graft  2004    LIMA to LAD; SVG to ramus intermedius; SVG to PDA/PLSA  . Tonsillectomy  as child  . Colonoscopy    . Laminectomy  09/08/2011    Procedure: LUMBAR LAMINECTOMY FOR TUMOR;  Surgeon: Maeola Harman, MD;  Location: [facility];  Service: Neurosurgery;  Laterality: Left;  Left Thoracic twelve-Lumbar one transpedicular resection of epidural mass  . Cardiac catheterization  01-03-12    '04  . Total knee arthroplasty  01/15/2012    Procedure: TOTAL KNEE ARTHROPLASTY;  Surgeon: Loanne Drilling, MD;  Location: [facility];  Service: Orthopedics;  Laterality: Right;  . Back surgery  2013  . Total knee arthroplasty Left 09/16/2012    Procedure: LEFT TOTAL KNEE ARTHROPLASTY;  Surgeon: Loanne Drilling, MD;  Location: [facility];  Service: Orthopedics;  Laterality: Left;    FAMHx:  Family History  Problem Relation Age of Onset  . Cirrhosis Mother     etiology unclear  .  Hypertension Father   . Stroke Father   . Cancer Sister     breast  . Stroke Maternal Grandmother   . Heart disease Maternal Grandfather     MI  . Stroke Paternal Grandmother   . Stroke Paternal Grandfather   . Anesthesia problems Neg Hx   . Hypotension Neg Hx   . Malignant hyperthermia Neg Hx   . Pseudochol deficiency Neg Hx     SOCHx:   reports that he has never smoked. He has never used smokeless tobacco. He reports that he does not drink alcohol or use illicit drugs.  ALLERGIES:  No Known Allergies  ROS: A comprehensive review of systems was negative except for: Constitutional: positive for fatigue Cardiovascular: positive for chest pain, lower extremity edema and syncope Neurological: positive for dizziness and neuropathy, foot  drop  HOME MEDS: Current Outpatient Prescriptions  Medication Sig Dispense Refill  . aspirin 81 MG tablet Take 81 mg by mouth daily.      Marland Kitchen atorvastatin (LIPITOR) 40 MG tablet Take 40 mg by mouth at bedtime.      . clobetasol (OLUX) 0.05 % topical foam as needed.      . diclofenac (VOLTAREN) 75 MG EC tablet TAKE 1 TABLET TWICE A DAY  180 tablet  1  . docusate sodium (COLACE) 100 MG capsule Take 100 mg by mouth daily.      . FreeStyle Unistick II Lancets MISC Use once daily as instructed.       Marland Kitchen glucose blood (FREESTYLE LITE) test strip Use once daily as instructed  100 each  11  . glyBURIDE (DIABETA) 5 MG tablet Take 5 mg by mouth daily with breakfast.      . Hypertonic Nasal Wash (SINUS RINSE BOTTLE KIT NA) Place 1 application into the nose at bedtime.      . Hypromellose (GENTEAL MILD) 0.2 % SOLN Apply 1 drop to eye 2 (two) times daily as needed (dry eyes).      Marland Kitchen ketoconazole (NIZORAL) 2 % shampoo Apply 1 application topically 2 (two) times a week.      . Menthol, Topical Analgesic, (ZIMS MAX-FREEZE EX) Apply topically daily.      . metFORMIN (GLUCOPHAGE) 1000 MG tablet Take 1,000 mg by mouth 2 (two) times daily with a meal.      . metoprolol succinate (TOPROL-XL) 25 MG 24 hr tablet TAKE 1 TABLET DAILY  90 tablet  1  . Olopatadine HCl (PATADAY) 0.2 % SOLN Apply 1 drop to eye 2 (two) times daily.      . Omega-3 Fatty Acids (FISH OIL BURP-LESS) 1000 MG CAPS Take by mouth daily.      Marland Kitchen omeprazole (PRILOSEC) 20 MG capsule TAKE 1 CAPSULE TWICE DAILY  180 capsule  1  . polyethylene glycol (MIRALAX / GLYCOLAX) packet Take 17 g by mouth daily as needed.  14 each  0  . ramipril (ALTACE) 10 MG capsule TAKE 1 CAPSULE DAILY  90 capsule  1  . VERAMYST 27.5 MCG/SPRAY nasal spray USE 2 SPRAYS INTO EACH NOSTRIL DAILY AS DIRECTED  10 g  1  . [DISCONTINUED] testosterone cypionate (DEPO-TESTOSTERONE) 200 MG/ML injection Inject 1 mL (200 mg total) into the muscle every 30 (thirty) days.  10 mL  0   No  current facility-administered medications for this visit.    LABS/IMAGING: No results found for this or any previous visit (from the past 48 hour(s)). No results found.  VITALS: BP 142/70  Pulse 69  Ht 5'  8" (1.727 m)  Wt 164 lb (74.39 kg)  BMI 24.94 kg/m2  EXAM: General appearance: alert and no distress Neck: no adenopathy, no carotid bruit, no JVD, supple, symmetrical, trachea midline and thyroid not enlarged, symmetric, no tenderness/mass/nodules Lungs: clear to auscultation bilaterally Heart: regular rate and rhythm, S1, S2 normal, no murmur, click, rub or gallop Abdomen: soft, non-tender; bowel sounds normal; no masses,  no organomegaly Extremities: edema trace bilateral, LLE brace Pulses: 2+ and symmetric Skin: Skin color, texture, turgor normal. No rashes or lesions Neurologic: Grossly normal  EKG: Sinus rhythm at 69, no ischemic changes  ASSESSMENT: 1. Syncope, likely vasovagal, given his history of neuropathy and lower extremity edema - arrythmia cannot be excluded 2. Sharp abrupt onset chest pain prior to the event, which has not reoccurred 3. Coronary artery disease status post CABG in 2004 4. Diabetes type 2 5. Peripheral neuropathy 6. HTN 7. Dyslipidemia  PLAN: 1.   Mr. Pape had what sounds like a syncopal event. He's only had 2 in his lifetime and the last was about one year ago. The episode was preceded by what he described as a sharp pain in the left chest which could have triggered a vasovagal reaction. This in combination with him abruptly turning his head to the side made intermittently decreased blood flow to his brain and cause him to syncopized. Arrhythmia cannot be excluded. We discussed workup and I feel that he should have a stress test to evaluate the grafts since they are 79 years old. If this is negative for ischemia then he could wear a monitor for period of time, however for the relative infrequency of the events, a loop recorder may be helpful to  try to pick up on arrhythmias. His EF is known to be preserved, but if it is reduced on nuclear stress testing he will need another echocardiogram. This would also likely increase the risk of arrhythmia genic syncope. My suspicion however is this is more likely related to peripheral neuropathy. I did mention that he could benefit from wearing stockings which would both help his lower extremity edema and make it less likely for him to vasodilate.  We'll plan to see him back after his stress test and continue further workup.  Chrystie Nose, MD, [facility] Attending Cardiologist [facility]  HILTY,Kenneth C 12/07/2012, 11:52 AM

## 2012-12-09 DIAGNOSIS — H903 Sensorineural hearing loss, bilateral: Secondary | ICD-10-CM | POA: Diagnosis not present

## 2012-12-09 DIAGNOSIS — R040 Epistaxis: Secondary | ICD-10-CM | POA: Diagnosis not present

## 2012-12-09 DIAGNOSIS — H9319 Tinnitus, unspecified ear: Secondary | ICD-10-CM | POA: Diagnosis not present

## 2012-12-10 ENCOUNTER — Ambulatory Visit: Payer: Medicare Other | Admitting: Physician Assistant

## 2012-12-10 ENCOUNTER — Other Ambulatory Visit: Payer: Self-pay | Admitting: Internal Medicine

## 2012-12-11 ENCOUNTER — Ambulatory Visit (HOSPITAL_COMMUNITY)
Admission: RE | Admit: 2012-12-11 | Discharge: 2012-12-11 | Disposition: A | Discharge status: Home / Self Care | Payer: Medicare Other | Source: Ambulatory Visit | Attending: Internal Medicine | Admitting: Internal Medicine

## 2012-12-11 DIAGNOSIS — R9431 Abnormal electrocardiogram [ECG] [EKG]: Secondary | ICD-10-CM | POA: Diagnosis not present

## 2012-12-11 DIAGNOSIS — R55 Syncope and collapse: Secondary | ICD-10-CM | POA: Diagnosis not present

## 2012-12-11 DIAGNOSIS — I2510 Atherosclerotic heart disease of native coronary artery without angina pectoris: Secondary | ICD-10-CM | POA: Diagnosis not present

## 2012-12-11 DIAGNOSIS — Z951 Presence of aortocoronary bypass graft: Secondary | ICD-10-CM | POA: Insufficient documentation

## 2012-12-11 MED ORDER — AMINOPHYLLINE 25 MG/ML IV SOLN
75.0000 mg | Freq: Once | INTRAVENOUS | Status: AC
  Administered 2012-12-11: 75 mg via INTRAVENOUS

## 2012-12-11 MED ORDER — TECHNETIUM TC 99M SESTAMIBI GENERIC - CARDIOLITE
10.0000 | Freq: Once | INTRAVENOUS | Status: AC | PRN
  Administered 2012-12-11: 10 via INTRAVENOUS

## 2012-12-11 MED ORDER — REGADENOSON 0.4 MG/5ML IV SOLN
0.4000 mg | Freq: Once | INTRAVENOUS | Status: AC
  Administered 2012-12-11: 0.4 mg via INTRAVENOUS

## 2012-12-11 MED ORDER — TECHNETIUM TC 99M SESTAMIBI GENERIC - CARDIOLITE
29.8000 | Freq: Once | INTRAVENOUS | Status: AC | PRN
  Administered 2012-12-11: 29.8 via INTRAVENOUS

## 2012-12-11 NOTE — Procedures (Addendum)
[facility] [address] [place] [address] [phone]  Cardiology Nuclear Med Study  Steven [name] is a 79 y.o. male     MRN : [id number]     DOB: November 22, 1930  Procedure Date: 12/11/2012  Nuclear Med Background Indication for Stress Test:  Evaluation for Ischemia, Graft Patency and Abnormal EKG History:  CAD;MI-2004;CABG X3--2004;STENT/PTCA--2004;SVT; Cardiac Risk Factors: Carotid Disease, History of Smoking, Hypertension, Lipids, NIDDM and Overweight  Symptoms:  Chest Pain, Dizziness, DOE and Syncope   Nuclear Pre-Procedure Caffeine/Decaff Intake:  12:00am NPO After: 10AM   IV Site: R Hand  IV 0.9% NS with Angio Cath:  22g  Chest Size (in):  42"  IV Started by: Emmit Pomfret, RN  Height: 5\' 8"  (1.727 m)  Cup Size: n/a  BMI:  Body mass index is 24.94 kg/(m^2). Weight:  164 lb (74.39 kg)   Tech Comments:  N/A    Nuclear Med Study 1 or 2 day study: 1 day  Stress Test Type:  Lexiscan  Order Authorizing Provider:  Zoila Shutter, MD   Resting Radionuclide: Technetium 35m Sestamibi  Resting Radionuclide Dose: 10.0 mCi   Stress Radionuclide:  Technetium 77m Sestamibi  Stress Radionuclide Dose: 29.8 mCi           Stress Protocol Rest HR: 68 Stress HR: 80  Rest BP: 131/69 Stress BP: 131/64  Exercise Time (min): n/a METS: n/a   Predicted Max HR: 141 bpm % Max HR: 73.76 bpm Rate Pressure Product: 21308  Dose of Adenosine (mg):  n/a Dose of Lexiscan: 0.4 mg  Dose of Atropine (mg): n/a Dose of Dobutamine: n/a mcg/kg/min (at max HR)  Stress Test Technologist: Esperanza Sheets, CCT Nuclear Technologist: Gonzella Lex, CNMT   Rest Procedure:  Myocardial perfusion imaging was performed at rest 45 minutes following the intravenous administration of Technetium 71m Sestamibi. Stress Procedure:  The patient received IV Lexiscan 0.4 mg over 15-seconds.  Technetium 23m Sestamibi injected at 30-seconds.  The patient experienced  chest pressure and stated he felt sweaty, 75 mg of IV Aminophylline was administered with resolution.  There were no significant changes with Lexiscan.  Quantitative spect images were obtained after a 45 minute delay.  Transient Ischemic Dilatation (Normal <1.22):  1.06 Lung/Heart Ratio (Normal <0.45):  0.41 QGS EDV:  104 ml QGS ESV:  47 ml LV Ejection Fraction: 55%  Signed by       Rest ECG: NSR with non-specific ST-T wave changes  Stress ECG: No significant change from baseline ECG  QPS Raw Data Images:  Normal; no motion artifact; normal heart/lung ratio. Stress Images:  There is decreased uptake in the inferior wall. Rest Images:  There is decreased uptake in the inferior wall. Subtraction (SDS):  No evidence of ischemia.  Impression Exercise Capacity:  Lexiscan with no exercise. BP Response:  Normal blood pressure response. Clinical Symptoms:  Mild chest pain/dyspnea. ECG Impression:  No significant ST segment change suggestive of ischemia. Comparison with Prior Nuclear Study: No previous nuclear study performed  Overall Impression:  Low risk stress nuclear study Inferior scar w/o ischemia.  LV Wall Motion:  Over nl LV fxn with mild inferapical hypokinesis   Runell Gess, MD  12/11/2012 5:30 PM

## 2012-12-12 DIAGNOSIS — Z471 Aftercare following joint replacement surgery: Secondary | ICD-10-CM | POA: Diagnosis not present

## 2012-12-12 DIAGNOSIS — M25519 Pain in unspecified shoulder: Secondary | ICD-10-CM | POA: Diagnosis not present

## 2012-12-13 DIAGNOSIS — H00029 Hordeolum internum unspecified eye, unspecified eyelid: Secondary | ICD-10-CM | POA: Diagnosis not present

## 2012-12-13 DIAGNOSIS — E119 Type 2 diabetes mellitus without complications: Secondary | ICD-10-CM | POA: Diagnosis not present

## 2012-12-25 ENCOUNTER — Encounter: Payer: Self-pay | Admitting: Internal Medicine

## 2012-12-25 ENCOUNTER — Ambulatory Visit (INDEPENDENT_AMBULATORY_CARE_PROVIDER_SITE_OTHER): Payer: Medicare Other | Admitting: Internal Medicine

## 2012-12-25 VITALS — BP 118/70 | HR 72 | Ht 69.0 in | Wt 164.0 lb

## 2012-12-25 DIAGNOSIS — E119 Type 2 diabetes mellitus without complications: Secondary | ICD-10-CM | POA: Diagnosis not present

## 2012-12-25 DIAGNOSIS — I471 Supraventricular tachycardia: Secondary | ICD-10-CM

## 2012-12-25 DIAGNOSIS — R55 Syncope and collapse: Secondary | ICD-10-CM

## 2012-12-25 DIAGNOSIS — I498 Other specified cardiac arrhythmias: Secondary | ICD-10-CM

## 2012-12-25 DIAGNOSIS — I779 Disorder of arteries and arterioles, unspecified: Secondary | ICD-10-CM | POA: Diagnosis not present

## 2012-12-25 DIAGNOSIS — E1142 Type 2 diabetes mellitus with diabetic polyneuropathy: Secondary | ICD-10-CM

## 2012-12-25 DIAGNOSIS — I25810 Atherosclerosis of coronary artery bypass graft(s) without angina pectoris: Secondary | ICD-10-CM | POA: Diagnosis not present

## 2012-12-25 NOTE — Patient Instructions (Addendum)
Your physician wants you to follow-up in: 1 year. You will receive a reminder letter in the mail two months in advance. If you don't receive a letter, please call our office to schedule the follow-up appointment.  

## 2012-12-25 NOTE — Progress Notes (Signed)
OFFICE NOTE  Chief Complaint:  New patient, fall, ?syncope  Primary Care Physician: Judie Petit, MD  HPI:  Steven [name] is a pleasant 79 year old male with history of coronary artery disease and coronary artery bypass grafting in 2004 after an inferior MI. He also has had a stent. He hasn't has diabetes, neuropathy, dyslipidemia, GERD and foot drop on the left for which he wears a brace. He has been followed by Dr. Huston Foley at with our cardiology. He saw her last in June of 2013 but reported since she is not seeing patients regularly in the office that he wished to see a different cardiologist. He has not had a stress test and a number of years. Recently he describes an episode where he was in his kitchen and felt a sharp pain in his left chest. This caused him to immediately turn his head violently to the right and he felt dizzy and then passed out. He does not remember falling but did catch himself and was confused when he was found fairly shortly afterwards on the floor. He does report he had an episode similar to this about one year ago where he fell backwards but was caught and did not totally pass out. He denies his heart racing or any periods of awareness of dizziness or blurred vision prior to the event.  The chest pain was described as sharp and sudden in the left chest like he was "punched" in the left chest. He has had no further chest pain or events.  He underwent nuclear stress testing recently which showed patent grafts and a small fixed inferior defect consistent with prior inferior MI. EF is preserved.  PMHx:  Past Medical History  Diagnosis Date  . Diabetes mellitus     type II  . GERD (gastroesophageal reflux disease)   . Hyperlipidemia   . History of myocardial infarction   . Myocardial infarction 01-03-12    '04  . CAD (coronary artery disease) 2004    s/p inferior wall Mi 2004 with PTCA/Stent; s/p CABG 2004  . Arthritis 01-03-12    osteoarthritis-knees  .  Neuropathy 01-03-12    bil. feet  . MYOCARDIAL INFARCTION, HX OF 04/19/2007    Qualifier: Diagnosis of  By: Everett Graff    . Heart disease   . Degenerative arthritis   . Polyneuropathy in diabetes(357.2) 08/23/2012  . Foot drop, bilateral 08/23/2012  . Abnormality of gait 08/23/2012  . Pneumonia     hx of  . History of kidney stones   . Anemia     hx of    Past Surgical History  Procedure Laterality Date  . Coronary artery bypass graft  2004    LIMA to LAD; SVG to ramus intermedius; SVG to PDA/PLSA  . Tonsillectomy  as child  . Colonoscopy    . Laminectomy  09/08/2011    Procedure: LUMBAR LAMINECTOMY FOR TUMOR;  Surgeon: Maeola Harman, MD;  Location: [facility];  Service: Neurosurgery;  Laterality: Left;  Left Thoracic twelve-Lumbar one transpedicular resection of epidural mass  . Cardiac catheterization  01-03-12    '04  . Total knee arthroplasty  01/15/2012    Procedure: TOTAL KNEE ARTHROPLASTY;  Surgeon: Loanne Drilling, MD;  Location: [facility];  Service: Orthopedics;  Laterality: Right;  . Back surgery  2013  . Total knee arthroplasty Left 09/16/2012    Procedure: LEFT TOTAL KNEE ARTHROPLASTY;  Surgeon: Loanne Drilling, MD;  Location: [facility];  Service: Orthopedics;  Laterality: Left;    FAMHx:  Family History  Problem Relation Age of Onset  . Cirrhosis Mother     etiology unclear  . Hypertension Father   . Stroke Father   . Cancer Sister     breast  . Stroke Maternal Grandmother   . Heart disease Maternal Grandfather     MI  . Stroke Paternal Grandmother   . Stroke Paternal Grandfather   . Anesthesia problems Neg Hx   . Hypotension Neg Hx   . Malignant hyperthermia Neg Hx   . Pseudochol deficiency Neg Hx     SOCHx:   reports that he has never smoked. He has never used smokeless tobacco. He reports that he does not drink alcohol or use illicit drugs.  ALLERGIES:  No Known Allergies  ROS: A comprehensive review of systems was negative except for: Constitutional:  positive for fatigue Cardiovascular: positive for chest pain, lower extremity edema and syncope Neurological: positive for dizziness and neuropathy, foot drop  HOME MEDS: Current Outpatient Prescriptions  Medication Sig Dispense Refill  . ALREX 0.2 % SUSP Place 1 drop into both eyes daily.      Marland Kitchen aspirin 81 MG tablet Take 81 mg by mouth daily.      Marland Kitchen atorvastatin (LIPITOR) 40 MG tablet Take 40 mg by mouth at bedtime.      . clobetasol (OLUX) 0.05 % topical foam as needed.      . diclofenac (VOLTAREN) 75 MG EC tablet TAKE 1 TABLET TWICE A DAY  180 tablet  1  . docusate sodium (COLACE) 100 MG capsule Take 100 mg by mouth daily.      . FreeStyle Unistick II Lancets MISC Use once daily as instructed.       Marland Kitchen glucose blood (FREESTYLE LITE) test strip Use once daily as instructed  100 each  11  . glyBURIDE (DIABETA) 5 MG tablet Take 5 mg by mouth daily with breakfast.      . Hypertonic Nasal Wash (SINUS RINSE BOTTLE KIT NA) Place 1 application into the nose at bedtime.      . Hypromellose (GENTEAL MILD) 0.2 % SOLN Apply 1 drop to eye 2 (two) times daily as needed (dry eyes).      Marland Kitchen ipratropium (ATROVENT) 0.06 % nasal spray daily as needed.      Marland Kitchen ketoconazole (NIZORAL) 2 % shampoo Apply 1 application topically 2 (two) times a week.      . Menthol, Topical Analgesic, (ZIMS MAX-FREEZE EX) Apply topically daily.      . metFORMIN (GLUCOPHAGE) 1000 MG tablet Take 1,000 mg by mouth 2 (two) times daily with a meal.      . metoprolol succinate (TOPROL-XL) 25 MG 24 hr tablet TAKE 1 TABLET DAILY  90 tablet  1  . Olopatadine HCl (PATADAY) 0.2 % SOLN Apply 1 drop to eye 2 (two) times daily.      . Omega-3 Fatty Acids (FISH OIL BURP-LESS) 1000 MG CAPS Take by mouth daily.      Marland Kitchen omeprazole (PRILOSEC) 20 MG capsule TAKE 1 CAPSULE TWICE DAILY  180 capsule  1  . polyethylene glycol (MIRALAX / GLYCOLAX) packet Take 17 g by mouth daily as needed.  14 each  0  . ramipril (ALTACE) 10 MG capsule TAKE 1 CAPSULE DAILY   90 capsule  1  . VERAMYST 27.5 MCG/SPRAY nasal spray USE 2 SPRAYS INTO EACH NOSTRIL DAILY AS DIRECTED  10 g  1  . [DISCONTINUED] testosterone cypionate (DEPO-TESTOSTERONE) 200 MG/ML  injection Inject 1 mL (200 mg total) into the muscle every 30 (thirty) days.  10 mL  0   No current facility-administered medications for this visit.    LABS/IMAGING: No results found for this or any previous visit (from the past 48 hour(s)). No results found.  VITALS: BP 118/70  Pulse 72  Ht 5\' 9"  (1.753 m)  Wt 164 lb (74.39 kg)  BMI 24.21 kg/m2  EXAM: Deferred  EKG: deferred  ASSESSMENT: 1. Syncope, likely vasovagal, given his history of neuropathy and lower extremity edema - arrythmia cannot be excluded 2. Sharp abrupt onset chest pain prior to the event, which has not reoccurred 3. Coronary artery disease status post CABG in 2004 4. Diabetes type 2 5. Peripheral neuropathy 6. HTN 7. Dyslipidemia 8. Low risk nuclear stress test 12/11/2012  PLAN: 1.   Mr. Lorusso had what sounds like a syncopal event. He's only had 2 in his lifetime and the last was about one year ago. The episode was preceded by what he described as a sharp pain in the left chest which could have triggered a vasovagal reaction. This in combination with him abruptly turning his head to the side made intermittently decreased blood flow to his brain and cause him to syncopized. Arrhythmia cannot be excluded, but is less likely as he has a normal EF and no ischemia. He does have scar, which could be a nidus for arrythmia. At this point, I would not recommend further rhythm evaluation. If he has another event, a loop recorder would be indicated. He wishes to have follow-up dopplers of his mild to moderate carotid artery disease in our office next April.  Plan to see him back at that time.  Chrystie Nose, MD, [facility] Attending Cardiologist [facility]  Freeda Spivey C 12/25/2012, 2:44 PM

## 2012-12-26 ENCOUNTER — Other Ambulatory Visit: Payer: Self-pay | Admitting: Internal Medicine

## 2012-12-27 ENCOUNTER — Ambulatory Visit: Payer: Medicare Other | Admitting: Internal Medicine

## 2012-12-30 ENCOUNTER — Other Ambulatory Visit: Payer: Self-pay | Admitting: Internal Medicine

## 2012-12-30 DIAGNOSIS — Z23 Encounter for immunization: Secondary | ICD-10-CM | POA: Diagnosis not present

## 2013-01-01 ENCOUNTER — Ambulatory Visit (HOSPITAL_COMMUNITY)
Admission: RE | Admit: 2013-01-01 | Discharge: 2013-01-01 | Disposition: A | Discharge status: Home / Self Care | Payer: Medicare Other | Source: Ambulatory Visit | Attending: Internal Medicine | Admitting: Internal Medicine

## 2013-02-03 ENCOUNTER — Ambulatory Visit (INDEPENDENT_AMBULATORY_CARE_PROVIDER_SITE_OTHER): Payer: Medicare Other | Admitting: Internal Medicine

## 2013-02-03 ENCOUNTER — Encounter: Payer: Self-pay | Admitting: Internal Medicine

## 2013-02-03 VITALS — BP 132/66 | HR 72 | Temp 98.0°F | Ht 69.0 in | Wt 166.0 lb

## 2013-02-03 DIAGNOSIS — E1159 Type 2 diabetes mellitus with other circulatory complications: Secondary | ICD-10-CM

## 2013-02-03 DIAGNOSIS — Z125 Encounter for screening for malignant neoplasm of prostate: Secondary | ICD-10-CM

## 2013-02-03 DIAGNOSIS — E785 Hyperlipidemia, unspecified: Secondary | ICD-10-CM | POA: Diagnosis not present

## 2013-02-03 DIAGNOSIS — Z23 Encounter for immunization: Secondary | ICD-10-CM

## 2013-02-03 DIAGNOSIS — I25810 Atherosclerosis of coronary artery bypass graft(s) without angina pectoris: Secondary | ICD-10-CM

## 2013-02-03 DIAGNOSIS — E119 Type 2 diabetes mellitus without complications: Secondary | ICD-10-CM

## 2013-02-03 DIAGNOSIS — Z0000 Encounter for general adult medical examination without abnormal findings: Secondary | ICD-10-CM

## 2013-02-03 DIAGNOSIS — K219 Gastro-esophageal reflux disease without esophagitis: Secondary | ICD-10-CM | POA: Diagnosis not present

## 2013-02-03 LAB — MICROALBUMIN / CREATININE URINE RATIO
Creatinine,U: 109.2 mg/dL
Microalb Creat Ratio: 0.7 mg/g (ref 0.0–30.0)
Microalb, Ur: 0.8 mg/dL (ref 0.0–1.9)

## 2013-02-03 LAB — BASIC METABOLIC PANEL
BUN: 25 mg/dL — ABNORMAL HIGH (ref 6–23)
Chloride: 105 mEq/L (ref 96–112)
Potassium: 5.6 mEq/L — ABNORMAL HIGH (ref 3.5–5.1)
Sodium: 139 mEq/L (ref 135–145)

## 2013-02-03 LAB — LIPID PANEL
Cholesterol: 132 mg/dL (ref 0–200)
HDL: 34.4 mg/dL — ABNORMAL LOW (ref >39.00)
LDL Cholesterol: 81 mg/dL (ref 0–99)
Triglycerides: 85 mg/dL (ref 0.0–149.0)
VLDL: 17 mg/dL (ref 0.0–40.0)

## 2013-02-03 LAB — HEPATIC FUNCTION PANEL
Albumin: 4.1 g/dL (ref 3.5–5.2)
Total Protein: 6.6 g/dL (ref 6.0–8.3)

## 2013-02-03 LAB — HEMOGLOBIN A1C: Hgb A1c MFr Bld: 6.3 % (ref 4.6–6.5)

## 2013-02-03 NOTE — Progress Notes (Signed)
patient comes in for followup of multiple medical problems including type 2 diabetes, hyperlipidemia, hypertension. The patient does not check blood sugar or blood pressure at home. The patetient does not follow an exercise or diet program. The patient denies any polyuria, polydipsia.  In the past the patient has gone to diabetic treatment center. The patient is tolerating medications  Without difficulty. The patient does admit to medication compliance.   Past Medical History  Diagnosis Date  . Diabetes mellitus     type II  . GERD (gastroesophageal reflux disease)   . Hyperlipidemia   . History of myocardial infarction   . Myocardial infarction 01-03-12    '04  . CAD (coronary artery disease) 2004    s/p inferior wall Mi 2004 with PTCA/Stent; s/p CABG 2004  . Arthritis 01-03-12    osteoarthritis-knees  . Neuropathy 01-03-12    bil. feet  . MYOCARDIAL INFARCTION, HX OF 04/19/2007    Qualifier: Diagnosis of  By: Everett Graff    . Heart disease   . Degenerative arthritis   . Polyneuropathy in diabetes(357.2) 08/23/2012  . Foot drop, bilateral 08/23/2012  . Abnormality of gait 08/23/2012  . Pneumonia     hx of  . History of kidney stones   . Anemia     hx of    History   Social History  . Marital Status: Married    Spouse Name: N/A    Number of Children: 2  . Years of Education: College   Occupational History  . Biomedical engineer    Social History Main Topics  . Smoking status: Never Smoker   . Smokeless tobacco: Never Used  . Alcohol Use: No  . Drug Use: No  . Sexual Activity: Not Currently   Other Topics Concern  . Not on file   Social History Narrative   Regular exercise: no   Daily Caffeine: 3 cups coffee daily    Past Surgical History  Procedure Laterality Date  . Coronary artery bypass graft  2004    LIMA to LAD; SVG to ramus intermedius; SVG to PDA/PLSA  . Tonsillectomy  as child  . Colonoscopy    . Laminectomy  09/08/2011    Procedure: LUMBAR LAMINECTOMY  FOR TUMOR;  Surgeon: Maeola Harman, MD;  Location: [facility];  Service: Neurosurgery;  Laterality: Left;  Left Thoracic twelve-Lumbar one transpedicular resection of epidural mass  . Cardiac catheterization  01-03-12    '04  . Total knee arthroplasty  01/15/2012    Procedure: TOTAL KNEE ARTHROPLASTY;  Surgeon: Loanne Drilling, MD;  Location: [facility];  Service: Orthopedics;  Laterality: Right;  . Back surgery  2013  . Total knee arthroplasty Left 09/16/2012    Procedure: LEFT TOTAL KNEE ARTHROPLASTY;  Surgeon: Loanne Drilling, MD;  Location: [facility];  Service: Orthopedics;  Laterality: Left;    Family History  Problem Relation Age of Onset  . Cirrhosis Mother     etiology unclear  . Hypertension Father   . Stroke Father   . Cancer Sister     breast  . Stroke Maternal Grandmother   . Heart disease Maternal Grandfather     MI  . Stroke Paternal Grandmother   . Stroke Paternal Grandfather   . Anesthesia problems Neg Hx   . Hypotension Neg Hx   . Malignant hyperthermia Neg Hx   . Pseudochol deficiency Neg Hx     No Known Allergies  Current Outpatient Prescriptions on File Prior to Visit  Medication Sig Dispense Refill  . ALREX 0.2 % SUSP Place 1 drop into both eyes daily.      Marland Kitchen aspirin 81 MG tablet Take 81 mg by mouth daily.      Marland Kitchen atorvastatin (LIPITOR) 40 MG tablet TAKE 1 TABLET AT BEDTIME  90 tablet  1  . clobetasol (OLUX) 0.05 % topical foam as needed.      . diclofenac (VOLTAREN) 75 MG EC tablet TAKE 1 TABLET TWICE A DAY  180 tablet  1  . docusate sodium (COLACE) 100 MG capsule Take 100 mg by mouth daily.      . FreeStyle Unistick II Lancets MISC Use once daily as instructed.       Marland Kitchen glucose blood (FREESTYLE LITE) test strip Use once daily as instructed  100 each  11  . glyBURIDE (DIABETA) 5 MG tablet TAKE 1 TABLET DAILY WITH BREAKFAST  90 tablet  1  . Hypertonic Nasal Wash (SINUS RINSE BOTTLE KIT NA) Place 1 application into the nose at bedtime.      . Hypromellose (GENTEAL  MILD) 0.2 % SOLN Apply 1 drop to eye 2 (two) times daily as needed (dry eyes).      Marland Kitchen ipratropium (ATROVENT) 0.06 % nasal spray daily as needed.      Marland Kitchen ketoconazole (NIZORAL) 2 % shampoo APPLY TOPICALLY 2 TIMES A WEEK  120 mL  3  . Menthol, Topical Analgesic, (ZIMS MAX-FREEZE EX) Apply topically daily.      . metFORMIN (GLUCOPHAGE) 1000 MG tablet TAKE 1 TABLET TWICE A DAY WITH MEALS  180 tablet  1  . metoprolol succinate (TOPROL-XL) 25 MG 24 hr tablet TAKE 1 TABLET DAILY  90 tablet  1  . Olopatadine HCl (PATADAY) 0.2 % SOLN Apply 1 drop to eye 2 (two) times daily.      . Omega-3 Fatty Acids (FISH OIL BURP-LESS) 1000 MG CAPS Take by mouth daily.      Marland Kitchen omeprazole (PRILOSEC) 20 MG capsule TAKE 1 CAPSULE TWICE DAILY  180 capsule  1  . polyethylene glycol (MIRALAX / GLYCOLAX) packet Take 17 g by mouth daily as needed.  14 each  0  . ramipril (ALTACE) 10 MG capsule TAKE 1 CAPSULE DAILY  90 capsule  1  . VERAMYST 27.5 MCG/SPRAY nasal spray USE 2 SPRAYS INTO EACH NOSTRIL DAILY AS DIRECTED  10 g  1  . [DISCONTINUED] testosterone cypionate (DEPO-TESTOSTERONE) 200 MG/ML injection Inject 1 mL (200 mg total) into the muscle every 30 (thirty) days.  10 mL  0   No current facility-administered medications on file prior to visit.     patient denies chest pain, shortness of breath, orthopnea. Denies lower extremity edema, abdominal pain, change in appetite, change in bowel movements. Patient denies rashes, musculoskeletal complaints. No other specific complaints in a complete review of systems.   BP 132/66  Pulse 72  Temp(Src) 98 F (36.7 C) (Oral)  Ht 5\' 9"  (1.753 m)  Wt 166 lb (75.297 kg)  BMI 24.5 kg/m2  well-developed well-nourished male in no acute distress. HEENT exam atraumatic, normocephalic, neck supple without jugular venous distention. Chest clear to auscultation cardiac exam S1-S2 are regular. Abdominal exam overweight with bowel sounds, soft and nontender. Extremities no edema. Neurologic exam  is alert.  Left foot drop- wearing ankle brace

## 2013-02-04 ENCOUNTER — Other Ambulatory Visit: Payer: Self-pay | Admitting: Internal Medicine

## 2013-02-05 NOTE — Assessment & Plan Note (Signed)
Continue risk factor modification 

## 2013-02-05 NOTE — Assessment & Plan Note (Signed)
No sxs-  Continue meds

## 2013-02-05 NOTE — Assessment & Plan Note (Signed)
Check labs today He should concentrate on weight loss.

## 2013-02-05 NOTE — Assessment & Plan Note (Signed)
Continue same meds

## 2013-02-11 ENCOUNTER — Other Ambulatory Visit: Payer: Self-pay | Admitting: Internal Medicine

## 2013-02-12 ENCOUNTER — Other Ambulatory Visit: Payer: Self-pay | Admitting: *Deleted

## 2013-02-12 ENCOUNTER — Other Ambulatory Visit (INDEPENDENT_AMBULATORY_CARE_PROVIDER_SITE_OTHER): Payer: Medicare Other

## 2013-02-12 DIAGNOSIS — I10 Essential (primary) hypertension: Secondary | ICD-10-CM | POA: Diagnosis not present

## 2013-02-12 LAB — BASIC METABOLIC PANEL
BUN: 28 mg/dL — ABNORMAL HIGH (ref 6–23)
Creatinine, Ser: 1.1 mg/dL (ref 0.4–1.5)
GFR: 67.77 mL/min (ref >60.00)
Glucose, Bld: 124 mg/dL — ABNORMAL HIGH (ref 70–99)

## 2013-02-12 MED ORDER — METOPROLOL SUCCINATE ER 25 MG PO TB24: ORAL_TABLET | ORAL | Status: DC

## 2013-02-14 ENCOUNTER — Other Ambulatory Visit: Payer: Self-pay | Admitting: *Deleted

## 2013-02-14 MED ORDER — DILTIAZEM HCL ER COATED BEADS 180 MG PO TB24: 180.0000 mg | ORAL_TABLET | Freq: Every day | ORAL | Status: DC

## 2013-03-03 DIAGNOSIS — R040 Epistaxis: Secondary | ICD-10-CM | POA: Diagnosis not present

## 2013-03-19 DIAGNOSIS — R040 Epistaxis: Secondary | ICD-10-CM | POA: Diagnosis not present

## 2013-04-07 DIAGNOSIS — E119 Type 2 diabetes mellitus without complications: Secondary | ICD-10-CM | POA: Diagnosis not present

## 2013-04-07 DIAGNOSIS — H25049 Posterior subcapsular polar age-related cataract, unspecified eye: Secondary | ICD-10-CM | POA: Diagnosis not present

## 2013-04-07 DIAGNOSIS — H18419 Arcus senilis, unspecified eye: Secondary | ICD-10-CM | POA: Diagnosis not present

## 2013-04-07 DIAGNOSIS — H2510 Age-related nuclear cataract, unspecified eye: Secondary | ICD-10-CM | POA: Diagnosis not present

## 2013-04-07 DIAGNOSIS — H25019 Cortical age-related cataract, unspecified eye: Secondary | ICD-10-CM | POA: Diagnosis not present

## 2013-04-07 DIAGNOSIS — H52229 Regular astigmatism, unspecified eye: Secondary | ICD-10-CM | POA: Diagnosis not present

## 2013-04-16 DIAGNOSIS — R040 Epistaxis: Secondary | ICD-10-CM | POA: Diagnosis not present

## 2013-04-16 DIAGNOSIS — J310 Chronic rhinitis: Secondary | ICD-10-CM | POA: Diagnosis not present

## 2013-04-17 HISTORY — PX: CATARACT EXTRACTION: SUR2

## 2013-04-17 LAB — HM DIABETES EYE EXAM

## 2013-05-26 ENCOUNTER — Telehealth (HOSPITAL_COMMUNITY): Payer: Self-pay | Admitting: *Deleted

## 2013-05-26 NOTE — Telephone Encounter (Signed)
Pt called wanting to know if he could have his carotid doppler test before April. He states that he doesn't want to have a stroke before that time. Please advise

## 2013-05-26 NOTE — Telephone Encounter (Signed)
Returned call and pt verified x 2.  Pt stated he was told he had his carotid test done in April of last year.  Stated he wanted to schedule it now.  Pt informed per last result that it isn't due until April of this year.  RN asked pt if he is having any problems that would warrant him needing to be seen sooner.  Pt stated he is fine.  Stated he could have a stroke tomorrow and then stated he is not having any problems.  Pt asked to schedule appt in April.  Pt informed scheduling will be notified.  Pt verbalized understanding and agreed w/ plan.  Ebony notified and stated she will call pt for April appt.  Pt informed and will await call back.  essage forwarded to [facility] in Ancillary Scheduling.

## 2013-05-30 ENCOUNTER — Other Ambulatory Visit: Payer: Self-pay | Admitting: Internal Medicine

## 2013-06-07 ENCOUNTER — Telehealth: Payer: Self-pay | Admitting: Internal Medicine

## 2013-06-07 NOTE — Telephone Encounter (Signed)
[facility] requesting scripts for the following:  atorvastatin (LIPITOR) 40 MG tablet glyBURIDE (DIABETA) 5 MG tablet metFORMIN (GLUCOPHAGE) 1000 MG tablet

## 2013-06-09 MED ORDER — ATORVASTATIN CALCIUM 40 MG PO TABS: ORAL_TABLET | ORAL | Status: DC

## 2013-06-09 MED ORDER — GLYBURIDE 5 MG PO TABS: ORAL_TABLET | ORAL | Status: DC

## 2013-06-09 MED ORDER — METFORMIN HCL 1000 MG PO TABS: ORAL_TABLET | ORAL | Status: DC

## 2013-06-09 NOTE — Telephone Encounter (Signed)
rx sent in electronically to [facility] 

## 2013-06-30 DIAGNOSIS — H2510 Age-related nuclear cataract, unspecified eye: Secondary | ICD-10-CM | POA: Diagnosis not present

## 2013-06-30 DIAGNOSIS — H269 Unspecified cataract: Secondary | ICD-10-CM | POA: Diagnosis not present

## 2013-06-30 DIAGNOSIS — H52209 Unspecified astigmatism, unspecified eye: Secondary | ICD-10-CM | POA: Diagnosis not present

## 2013-07-01 DIAGNOSIS — H2510 Age-related nuclear cataract, unspecified eye: Secondary | ICD-10-CM | POA: Diagnosis not present

## 2013-07-14 DIAGNOSIS — H269 Unspecified cataract: Secondary | ICD-10-CM | POA: Diagnosis not present

## 2013-07-14 DIAGNOSIS — H52209 Unspecified astigmatism, unspecified eye: Secondary | ICD-10-CM | POA: Diagnosis not present

## 2013-07-14 DIAGNOSIS — H2510 Age-related nuclear cataract, unspecified eye: Secondary | ICD-10-CM | POA: Diagnosis not present

## 2013-07-15 ENCOUNTER — Encounter: Payer: Medicare Other | Admitting: Internal Medicine

## 2013-07-17 ENCOUNTER — Other Ambulatory Visit (HOSPITAL_COMMUNITY): Payer: Self-pay | Admitting: Internal Medicine

## 2013-07-17 DIAGNOSIS — I6529 Occlusion and stenosis of unspecified carotid artery: Secondary | ICD-10-CM

## 2013-07-31 ENCOUNTER — Ambulatory Visit (HOSPITAL_COMMUNITY)
Admission: RE | Admit: 2013-07-31 | Discharge: 2013-07-31 | Disposition: A | Discharge status: Home / Self Care | Payer: Medicare Other | Source: Ambulatory Visit | Attending: Cardiology | Admitting: Cardiology

## 2013-07-31 DIAGNOSIS — I6529 Occlusion and stenosis of unspecified carotid artery: Secondary | ICD-10-CM | POA: Insufficient documentation

## 2013-07-31 NOTE — Progress Notes (Signed)
Carotid Duplex Completed. °Brianna L Mazza,RVT °

## 2013-08-08 ENCOUNTER — Other Ambulatory Visit: Payer: Self-pay | Admitting: Internal Medicine

## 2013-08-08 ENCOUNTER — Telehealth (HOSPITAL_COMMUNITY): Payer: Self-pay | Admitting: *Deleted

## 2013-08-08 NOTE — Telephone Encounter (Signed)
Pt wants his results to his carotid doppler... Please call

## 2013-08-12 ENCOUNTER — Telehealth: Payer: Self-pay | Admitting: Internal Medicine

## 2013-08-12 NOTE — Telephone Encounter (Signed)
Spoke with patient. Informed him he would be contacted as soon as Dr. Debara Pickett reviews his carotid doppler study

## 2013-08-12 NOTE — Telephone Encounter (Signed)
Returning your call. °

## 2013-08-12 NOTE — Telephone Encounter (Signed)
LM informing patient that Dr. Debara Pickett will review study and he will be notified.

## 2013-08-13 ENCOUNTER — Telehealth: Payer: Self-pay | Admitting: *Deleted

## 2013-08-13 DIAGNOSIS — I6529 Occlusion and stenosis of unspecified carotid artery: Secondary | ICD-10-CM

## 2013-08-13 NOTE — Telephone Encounter (Signed)
Message copied by Fidel Levy on Wed Aug 13, 2013  5:14 PM ------      Message from: Pixie Casino      Created: Wed Aug 13, 2013 11:33 AM       Moderate narrowing of the carotid on the right and mild narrowing on the left.            Dr. Lemmie Evens ------

## 2013-08-13 NOTE — Telephone Encounter (Signed)
Called patient with carotid doppler results. Informed repeat study will need to be done in 1 year. Voiced understanding

## 2013-08-14 ENCOUNTER — Telehealth: Payer: Self-pay | Admitting: Internal Medicine

## 2013-08-14 NOTE — Telephone Encounter (Signed)
Patient notified of information per Dr. Debara Pickett

## 2013-08-14 NOTE — Telephone Encounter (Signed)
No difference in stroke reduction between 81 mg and 325 mg aspirin.  No changes.  -Dr. Lemmie Evens

## 2013-08-14 NOTE — Telephone Encounter (Signed)
Please advise. Told patient when he was notified of results to continue current medications.Marland Kitchen

## 2013-08-14 NOTE — Telephone Encounter (Signed)
Message forwarded to Jenna, RN.  

## 2013-08-14 NOTE — Telephone Encounter (Signed)
Pr wants to know if he should increase his aspirin because of his doppler results. Pt states he does not want to have a stroke.

## 2013-08-19 DIAGNOSIS — R040 Epistaxis: Secondary | ICD-10-CM | POA: Diagnosis not present

## 2013-08-19 DIAGNOSIS — H6120 Impacted cerumen, unspecified ear: Secondary | ICD-10-CM | POA: Diagnosis not present

## 2013-09-01 ENCOUNTER — Telehealth: Payer: Self-pay | Admitting: Neurology

## 2013-09-01 NOTE — Telephone Encounter (Signed)
Patient calling about the results of his visit last 11/29/2012 with Dr. Jannifer Franklin.  He would like to know the outcome. Please call to advise. [phone]

## 2013-09-02 NOTE — Telephone Encounter (Signed)
I called patient. I don't have any record of any testing done in may of 2015 to this office. He had blood work done a year prior that was unremarkable. He is to call us back with a specific request for test results.

## 2013-09-02 NOTE — Telephone Encounter (Signed)
Pt is calling requesting the results of the test that he had done in the office on 08/23/13. Pt would like a call back at [phone]. Please advise

## 2013-09-03 ENCOUNTER — Ambulatory Visit (INDEPENDENT_AMBULATORY_CARE_PROVIDER_SITE_OTHER): Payer: Medicare Other | Admitting: Internal Medicine

## 2013-09-03 ENCOUNTER — Encounter: Payer: Self-pay | Admitting: Internal Medicine

## 2013-09-03 VITALS — BP 124/72 | HR 76 | Temp 98.7°F | Ht 69.0 in | Wt 163.0 lb

## 2013-09-03 DIAGNOSIS — M216X9 Other acquired deformities of unspecified foot: Secondary | ICD-10-CM | POA: Diagnosis not present

## 2013-09-03 DIAGNOSIS — K219 Gastro-esophageal reflux disease without esophagitis: Secondary | ICD-10-CM

## 2013-09-03 DIAGNOSIS — E785 Hyperlipidemia, unspecified: Secondary | ICD-10-CM

## 2013-09-03 DIAGNOSIS — E119 Type 2 diabetes mellitus without complications: Secondary | ICD-10-CM | POA: Diagnosis not present

## 2013-09-03 DIAGNOSIS — M21372 Foot drop, left foot: Secondary | ICD-10-CM

## 2013-09-03 DIAGNOSIS — I6529 Occlusion and stenosis of unspecified carotid artery: Secondary | ICD-10-CM | POA: Diagnosis not present

## 2013-09-03 DIAGNOSIS — D649 Anemia, unspecified: Secondary | ICD-10-CM

## 2013-09-03 LAB — SEDIMENTATION RATE: SED RATE: 21 mm/h (ref 0–22)

## 2013-09-03 LAB — HEMOGLOBIN A1C: Hgb A1c MFr Bld: 7 % — ABNORMAL HIGH (ref 4.6–6.5)

## 2013-09-03 LAB — LIPID PANEL
Cholesterol: 131 mg/dL (ref 0–200)
HDL: 36.9 mg/dL — ABNORMAL LOW (ref >39.00)
LDL Cholesterol: 79 mg/dL (ref 0–99)
Total CHOL/HDL Ratio: 4
Triglycerides: 77 mg/dL (ref 0.0–149.0)
VLDL: 15.4 mg/dL (ref 0.0–40.0)

## 2013-09-03 LAB — CBC WITH DIFFERENTIAL/PLATELET
Basophils Absolute: 0 10*3/uL (ref 0.0–0.1)
Basophils Relative: 0.5 % (ref 0.0–3.0)
EOS ABS: 0.2 10*3/uL (ref 0.0–0.7)
Eosinophils Relative: 2.2 % (ref 0.0–5.0)
HCT: 36 % — ABNORMAL LOW (ref 39.0–52.0)
HEMOGLOBIN: 12 g/dL — AB (ref 13.0–17.0)
LYMPHS PCT: 26.9 % (ref 12.0–46.0)
Lymphs Abs: 1.9 10*3/uL (ref 0.7–4.0)
MCHC: 33.4 g/dL (ref 30.0–36.0)
MCV: 92.5 fl (ref 78.0–100.0)
MONOS PCT: 9 % (ref 3.0–12.0)
Monocytes Absolute: 0.6 10*3/uL (ref 0.1–1.0)
NEUTROS ABS: 4.4 10*3/uL (ref 1.4–7.7)
NEUTROS PCT: 61.4 % (ref 43.0–77.0)
Platelets: 230 10*3/uL (ref 150.0–400.0)
RBC: 3.89 Mil/uL — ABNORMAL LOW (ref 4.22–5.81)
RDW: 13.8 % (ref 11.5–15.5)
WBC: 7.1 10*3/uL (ref 4.0–10.5)

## 2013-09-03 LAB — HM DIABETES FOOT EXAM

## 2013-09-03 LAB — BASIC METABOLIC PANEL
BUN: 21 mg/dL (ref 6–23)
CHLORIDE: 106 meq/L (ref 96–112)
CO2: 27 meq/L (ref 19–32)
Calcium: 9.7 mg/dL (ref 8.4–10.5)
Creatinine, Ser: 1 mg/dL (ref 0.4–1.5)
GFR: 72.95 mL/min (ref >60.00)
GLUCOSE: 174 mg/dL — AB (ref 70–99)
POTASSIUM: 5.1 meq/L (ref 3.5–5.1)
SODIUM: 141 meq/L (ref 135–145)

## 2013-09-03 LAB — MICROALBUMIN / CREATININE URINE RATIO
Creatinine,U: 159.7 mg/dL
MICROALB/CREAT RATIO: 0.9 mg/g (ref 0.0–30.0)
Microalb, Ur: 1.4 mg/dL (ref 0.0–1.9)

## 2013-09-03 LAB — HEPATIC FUNCTION PANEL
ALBUMIN: 3.9 g/dL (ref 3.5–5.2)
ALK PHOS: 80 U/L (ref 39–117)
ALT: 12 U/L (ref 0–53)
AST: 15 U/L (ref 0–37)
Bilirubin, Direct: 0.1 mg/dL (ref 0.0–0.3)
Total Bilirubin: 0.6 mg/dL (ref 0.2–1.2)
Total Protein: 6.8 g/dL (ref 6.0–8.3)

## 2013-09-03 LAB — FERRITIN: Ferritin: 48.4 ng/mL (ref 22.0–322.0)

## 2013-09-03 NOTE — Assessment & Plan Note (Signed)
No sx's 

## 2013-09-03 NOTE — Progress Notes (Signed)
Pre visit review using our clinic review tool, if applicable. No additional management support is needed unless otherwise documented below in the visit note. 

## 2013-09-03 NOTE — Assessment & Plan Note (Signed)
Check labs Lipid Panel     Component Value Date/Time   CHOL 132 02/03/2013 0937   TRIG 85.0 02/03/2013 0937   HDL 34.40* 02/03/2013 0937   CHOLHDL 4 02/03/2013 0937   VLDL 17.0 02/03/2013 0937   LDLCALC 81 02/03/2013 0937

## 2013-09-03 NOTE — Assessment & Plan Note (Signed)
No change Likely DM related

## 2013-09-03 NOTE — Assessment & Plan Note (Signed)
Check labs today.

## 2013-09-03 NOTE — Progress Notes (Signed)
dm2- last a1c better controlled. Peripheral neuropathy-likely caused foot drop- see dr. Jannifer Franklin' note 2014  Cad- no chest pain, no SOB  DM2- no polyuria, polydipsia-   He does note new headache- occipital in nature- usually in the morning.  Lab Results  Component Value Date   HGBA1C 6.3 02/03/2013   Past Medical History  Diagnosis Date  . Diabetes mellitus     type II  . GERD (gastroesophageal reflux disease)   . Hyperlipidemia   . History of myocardial infarction   . Myocardial infarction 01-03-12    '04  . CAD (coronary artery disease) 2004    s/p inferior wall Mi 2004 with PTCA/Stent; s/p CABG 2004  . Arthritis 01-03-12    osteoarthritis-knees  . Neuropathy 01-03-12    bil. feet  . MYOCARDIAL INFARCTION, HX OF 04/19/2007    Qualifier: Diagnosis of  By: Scherrie Gerlach    . Heart disease   . Degenerative arthritis   . Polyneuropathy in diabetes(357.2) 08/23/2012  . Foot drop, bilateral 08/23/2012  . Abnormality of gait 08/23/2012  . Pneumonia     hx of  . History of kidney stones   . Anemia     hx of    History   Social History  . Marital Status: Married    Spouse Name: N/A    Number of Children: 2  . Years of Education: College   Occupational History  . Research scientist (life sciences)    Social History Main Topics  . Smoking status: Never Smoker   . Smokeless tobacco: Never Used  . Alcohol Use: No  . Drug Use: No  . Sexual Activity: Not Currently   Other Topics Concern  . Not on file   Social History Narrative   Regular exercise: no   Daily Caffeine: 3 cups coffee daily    Past Surgical History  Procedure Laterality Date  . Coronary artery bypass graft  2004    LIMA to LAD; SVG to ramus intermedius; SVG to PDA/PLSA  . Tonsillectomy  as child  . Colonoscopy    . Laminectomy  09/08/2011    Procedure: LUMBAR LAMINECTOMY FOR TUMOR;  Surgeon: Erline Levine, MD;  Location: [facility];  Service: Neurosurgery;  Laterality: Left;  Left Thoracic twelve-Lumbar one  transpedicular resection of epidural mass  . Cardiac catheterization  01-03-12    '04  . Total knee arthroplasty  01/15/2012    Procedure: TOTAL KNEE ARTHROPLASTY;  Surgeon: Gearlean Alf, MD;  Location: [facility];  Service: Orthopedics;  Laterality: Right;  . Back surgery  2013  . Total knee arthroplasty Left 09/16/2012    Procedure: LEFT TOTAL KNEE ARTHROPLASTY;  Surgeon: Gearlean Alf, MD;  Location: [facility];  Service: Orthopedics;  Laterality: Left;  . Cataract extraction Bilateral 2015    Family History  Problem Relation Age of Onset  . Cirrhosis Mother     etiology unclear  . Hypertension Father   . Stroke Father   . Cancer Sister     breast  . Stroke Maternal Grandmother   . Heart disease Maternal Grandfather     MI  . Stroke Paternal Grandmother   . Stroke Paternal Grandfather   . Anesthesia problems Neg Hx   . Hypotension Neg Hx   . Malignant hyperthermia Neg Hx   . Pseudochol deficiency Neg Hx     Allergies  Allergen Reactions  . Altace [Ramipril] Other (See Comments)    hyperkalemia     Current Outpatient Prescriptions on File  Prior to Visit  Medication Sig Dispense Refill  . ALREX 0.2 % SUSP Place 1 drop into both eyes daily.      Marland Kitchen aspirin 81 MG tablet Take 81 mg by mouth daily.      Marland Kitchen atorvastatin (LIPITOR) 40 MG tablet TAKE 1 TABLET AT BEDTIME  90 tablet  1  . diclofenac (VOLTAREN) 75 MG EC tablet TAKE 1 TABLET TWICE A DAY  180 tablet  0  . diltiazem (CARDIZEM LA) 180 MG 24 hr tablet Take 1 tablet (180 mg total) by mouth daily.  90 tablet  3  . glyBURIDE (DIABETA) 5 MG tablet TAKE 1 TABLET DAILY WITH BREAKFAST  90 tablet  1  . ketoconazole (NIZORAL) 2 % shampoo APPLY TOPICALLY 2 TIMES A WEEK  120 mL  3  . metFORMIN (GLUCOPHAGE) 1000 MG tablet TAKE 1 TABLET TWICE A DAY WITH MEALS  180 tablet  1  . metoprolol succinate (TOPROL-XL) 25 MG 24 hr tablet TAKE 1 TABLET DAILY  90 tablet  3  . Olopatadine HCl (PATADAY) 0.2 % SOLN Apply 1 drop to eye 2 (two) times  daily.      . Omega-3 Fatty Acids (FISH OIL BURP-LESS) 1000 MG CAPS Take by mouth daily.      Marland Kitchen omeprazole (PRILOSEC) 20 MG capsule TAKE 1 CAPSULE TWICE DAILY  180 capsule  1  . [DISCONTINUED] testosterone cypionate (DEPO-TESTOSTERONE) 200 MG/ML injection Inject 1 mL (200 mg total) into the muscle every 30 (thirty) days.  10 mL  0   No current facility-administered medications on file prior to visit.     patient denies chest pain, shortness of breath, orthopnea. Denies lower extremity edema, abdominal pain, change in appetite, change in bowel movements. Patient denies rashes, musculoskeletal complaints. No other specific complaints in a complete review of systems.   BP 124/72  Pulse 76  Temp(Src) 98.7 F (37.1 C) (Oral)  Ht 5\' 9"  (1.753 m)  Wt 163 lb (73.936 kg)  BMI 24.06 kg/m2  well-developed well-nourished male in no acute distress. HEENT exam atraumatic, normocephalic, neck supple without jugular venous distention. Chest clear to auscultation cardiac exam S1-S2 are regular. Abdominal exam overweight with bowel sounds, soft and nontender. Extremities no edema. Neurologic exam is alert with a bilateral foot drop   Foot drop, left No change Likely DM related  HYPERLIPIDEMIA Check labs Lipid Panel     Component Value Date/Time   CHOL 132 02/03/2013 0937   TRIG 85.0 02/03/2013 0937   HDL 34.40* 02/03/2013 0937   CHOLHDL 4 02/03/2013 0937   VLDL 17.0 02/03/2013 0937   LDLCALC 81 02/03/2013 0937      GERD No sxs  DIABETES MELLITUS, TYPE II Check labs today

## 2013-09-16 DIAGNOSIS — Z96659 Presence of unspecified artificial knee joint: Secondary | ICD-10-CM | POA: Diagnosis not present

## 2013-09-19 ENCOUNTER — Telehealth: Payer: Self-pay | Admitting: *Deleted

## 2013-09-19 NOTE — Telephone Encounter (Signed)
Pt states his bs is 174 and he wants to know if he can increase his glyburide to 10 mg.  Also his hgb 12.0, wants to know if he should take iron.  Please advise

## 2013-09-22 NOTE — Telephone Encounter (Signed)
Hemoglobin a1c is good Stay on same meds  Hemoglobin is somewhat low.  Check serum MMA and vit B12 along with CBC with diff and pathologic review of peripheral blood smear

## 2013-09-22 NOTE — Telephone Encounter (Signed)
Pt aware, lab appt scheduled

## 2013-09-24 ENCOUNTER — Other Ambulatory Visit (INDEPENDENT_AMBULATORY_CARE_PROVIDER_SITE_OTHER): Payer: Medicare Other

## 2013-09-24 DIAGNOSIS — E538 Deficiency of other specified B group vitamins: Secondary | ICD-10-CM | POA: Diagnosis not present

## 2013-09-24 DIAGNOSIS — D649 Anemia, unspecified: Secondary | ICD-10-CM

## 2013-09-24 LAB — CBC WITH DIFFERENTIAL/PLATELET
BASOS ABS: 0 10*3/uL (ref 0.0–0.1)
Basophils Relative: 0.4 % (ref 0.0–3.0)
EOS ABS: 0.2 10*3/uL (ref 0.0–0.7)
Eosinophils Relative: 2.6 % (ref 0.0–5.0)
HCT: 35.5 % — ABNORMAL LOW (ref 39.0–52.0)
Hemoglobin: 11.7 g/dL — ABNORMAL LOW (ref 13.0–17.0)
Lymphocytes Relative: 27.5 % (ref 12.0–46.0)
Lymphs Abs: 1.8 10*3/uL (ref 0.7–4.0)
MCHC: 32.9 g/dL (ref 30.0–36.0)
MCV: 93 fl (ref 78.0–100.0)
MONOS PCT: 7.6 % (ref 3.0–12.0)
Monocytes Absolute: 0.5 10*3/uL (ref 0.1–1.0)
NEUTROS PCT: 61.9 % (ref 43.0–77.0)
Neutro Abs: 4 10*3/uL (ref 1.4–7.7)
PLATELETS: 220 10*3/uL (ref 150.0–400.0)
RBC: 3.82 Mil/uL — ABNORMAL LOW (ref 4.22–5.81)
RDW: 14 % (ref 11.5–15.5)
WBC: 6.5 10*3/uL (ref 4.0–10.5)

## 2013-09-24 LAB — VITAMIN B12: Vitamin B-12: 538 pg/mL (ref 211–911)

## 2013-09-25 LAB — PATHOLOGIST SMEAR REVIEW

## 2013-09-27 LAB — METHYLMALONIC ACID, SERUM: METHYLMALONIC ACID, QUANT: 148 nmol/L (ref 87–318)

## 2013-10-21 DIAGNOSIS — R040 Epistaxis: Secondary | ICD-10-CM | POA: Diagnosis not present

## 2013-11-06 ENCOUNTER — Other Ambulatory Visit: Payer: Self-pay | Admitting: Internal Medicine

## 2013-11-24 ENCOUNTER — Other Ambulatory Visit: Payer: Self-pay | Admitting: Internal Medicine

## 2013-11-24 ENCOUNTER — Telehealth: Payer: Self-pay | Admitting: *Deleted

## 2013-11-24 NOTE — Telephone Encounter (Signed)
Sorry but I am too full  

## 2013-11-24 NOTE — Telephone Encounter (Signed)
Pt called wanting to know if Dr Sarajane Jews would accept him as a patient.  He seen Dr Sarajane Jews before in 7/14 and he really liked him.  Please advise

## 2013-11-25 NOTE — Telephone Encounter (Signed)
Pt agreed to see Dr Yong Channel, appt scheduled

## 2013-11-28 ENCOUNTER — Other Ambulatory Visit (INDEPENDENT_AMBULATORY_CARE_PROVIDER_SITE_OTHER): Payer: Medicare Other

## 2013-11-28 DIAGNOSIS — D509 Iron deficiency anemia, unspecified: Secondary | ICD-10-CM | POA: Diagnosis not present

## 2013-11-28 LAB — CBC WITH DIFFERENTIAL/PLATELET
BASOS ABS: 0.1 10*3/uL (ref 0.0–0.1)
Basophils Relative: 0.8 % (ref 0.0–3.0)
Eosinophils Absolute: 0.2 10*3/uL (ref 0.0–0.7)
Eosinophils Relative: 3.5 % (ref 0.0–5.0)
HCT: 35.4 % — ABNORMAL LOW (ref 39.0–52.0)
Hemoglobin: 11.9 g/dL — ABNORMAL LOW (ref 13.0–17.0)
LYMPHS ABS: 2 10*3/uL (ref 0.7–4.0)
Lymphocytes Relative: 28.2 % (ref 12.0–46.0)
MCHC: 33.5 g/dL (ref 30.0–36.0)
MCV: 91.1 fl (ref 78.0–100.0)
MONO ABS: 0.4 10*3/uL (ref 0.1–1.0)
MONOS PCT: 6.2 % (ref 3.0–12.0)
Neutro Abs: 4.3 10*3/uL (ref 1.4–7.7)
Neutrophils Relative %: 61.3 % (ref 43.0–77.0)
PLATELETS: 205 10*3/uL (ref 150.0–400.0)
RBC: 3.88 Mil/uL — ABNORMAL LOW (ref 4.22–5.81)
RDW: 13.8 % (ref 11.5–15.5)
WBC: 7 10*3/uL (ref 4.0–10.5)

## 2013-12-01 ENCOUNTER — Encounter: Payer: Self-pay | Admitting: Internal Medicine

## 2013-12-25 ENCOUNTER — Other Ambulatory Visit: Payer: Self-pay | Admitting: *Deleted

## 2013-12-25 ENCOUNTER — Ambulatory Visit (INDEPENDENT_AMBULATORY_CARE_PROVIDER_SITE_OTHER): Payer: Medicare Other | Admitting: Internal Medicine

## 2013-12-25 ENCOUNTER — Encounter: Payer: Self-pay | Admitting: Internal Medicine

## 2013-12-25 VITALS — BP 138/70 | HR 75 | Ht 69.0 in | Wt 164.2 lb

## 2013-12-25 DIAGNOSIS — R011 Cardiac murmur, unspecified: Secondary | ICD-10-CM

## 2013-12-25 DIAGNOSIS — I10 Essential (primary) hypertension: Secondary | ICD-10-CM | POA: Diagnosis not present

## 2013-12-25 DIAGNOSIS — I25810 Atherosclerosis of coronary artery bypass graft(s) without angina pectoris: Secondary | ICD-10-CM | POA: Diagnosis not present

## 2013-12-25 DIAGNOSIS — I6529 Occlusion and stenosis of unspecified carotid artery: Secondary | ICD-10-CM

## 2013-12-25 DIAGNOSIS — E785 Hyperlipidemia, unspecified: Secondary | ICD-10-CM | POA: Diagnosis not present

## 2013-12-25 MED ORDER — METFORMIN HCL 1000 MG PO TABS: ORAL_TABLET | ORAL | Status: DC

## 2013-12-25 MED ORDER — GLYBURIDE 5 MG PO TABS: ORAL_TABLET | ORAL | Status: DC

## 2013-12-25 MED ORDER — OMEPRAZOLE 20 MG PO CPDR: DELAYED_RELEASE_CAPSULE | ORAL | Status: DC

## 2013-12-25 MED ORDER — ATORVASTATIN CALCIUM 40 MG PO TABS: ORAL_TABLET | ORAL | Status: DC

## 2013-12-25 MED ORDER — METOPROLOL SUCCINATE ER 25 MG PO TB24: ORAL_TABLET | ORAL | Status: DC

## 2013-12-25 NOTE — Patient Instructions (Signed)
Your physician has requested that you have an echocardiogram. Echocardiography is a painless test that uses sound waves to create images of your heart. It provides your doctor with information about the size and shape of your heart and how well your heart's chambers and valves are working. This procedure takes approximately one hour. There are no restrictions for this procedure.  Your physician wants you to follow-up in: 1 year with Dr. Hilty. You will receive a reminder letter in the mail two months in advance. If you don't receive a letter, please call our office to schedule the follow-up appointment.  

## 2013-12-25 NOTE — Progress Notes (Signed)
OFFICE NOTE  Chief Complaint:  Routine follow-up  Primary Care Physician: Chancy Hurter, MD  HPI:  Steven [name] is a pleasant 80 year old male with history of coronary artery disease and coronary artery bypass grafting in 2004 after an inferior MI. He also has had a stent. He hasn't has diabetes, neuropathy, dyslipidemia, GERD and foot drop on the left for which he wears a brace. He has been followed by Dr. Wyatt [name] at with our cardiology. He saw her last in June of 2013 but reported since she is not seeing patients regularly in the office that he wished to see a different cardiologist. He has not had a stress test and a number of years. Recently he describes an episode where he was in his kitchen and felt a sharp pain in his left chest. This caused him to immediately turn his head violently to the right and he felt dizzy and then passed out. He does not remember falling but did catch himself and was confused when he was found fairly shortly afterwards on the floor. He does report he had an episode similar to this about one year ago where he fell backwards but was caught and did not totally pass out. He denies his heart racing or any periods of awareness of dizziness or blurred vision prior to the event.  The chest pain was described as sharp and sudden in the left chest like he was "punched" in the left chest. He has had no further chest pain or events.  He underwent nuclear stress testing recently which showed patent grafts and a small fixed inferior defect consistent with prior inferior MI. EF is preserved.  Steven [name] returns today for followup. He is feeling quite well. He has had no further syncopal events. He did have carotid Dopplers which showed moderate disease in the right and mild disease of the left. Is concerned about stroke.  I have assured him that that is unlikely this time and that we will continue to follow his carotid Dopplers closely. He has no chest pain complaints.  Blood pressure is well-controlled today.  PMHx:  Past Medical History  Diagnosis Date  . Diabetes mellitus     type II  . GERD (gastroesophageal reflux disease)   . Hyperlipidemia   . History of myocardial infarction   . Myocardial infarction 01-03-12    '04  . CAD (coronary artery disease) 2004    s/p inferior wall Mi 2004 with PTCA/Stent; s/p CABG 2004  . Arthritis 01-03-12    osteoarthritis-knees  . Neuropathy 01-03-12    bil. feet  . MYOCARDIAL INFARCTION, HX OF 04/19/2007    Qualifier: Diagnosis of  By: Scherrie Gerlach    . Heart disease   . Degenerative arthritis   . Polyneuropathy in diabetes(357.2) 08/23/2012  . Foot drop, bilateral 08/23/2012  . Abnormality of gait 08/23/2012  . Pneumonia     hx of  . History of kidney stones   . Anemia     hx of    Past Surgical History  Procedure Laterality Date  . Coronary artery bypass graft  2004    LIMA to LAD; SVG to ramus intermedius; SVG to PDA/PLSA  . Tonsillectomy  as child  . Colonoscopy    . Laminectomy  09/08/2011    Procedure: LUMBAR LAMINECTOMY FOR TUMOR;  Surgeon: Erline Levine, MD;  Location: [facility];  Service: Neurosurgery;  Laterality: Left;  Left Thoracic twelve-Lumbar one transpedicular resection of epidural mass  .  Cardiac catheterization  01-03-12    '04  . Total knee arthroplasty  01/15/2012    Procedure: TOTAL KNEE ARTHROPLASTY;  Surgeon: Gearlean Alf, MD;  Location: [facility];  Service: Orthopedics;  Laterality: Right;  . Back surgery  2013  . Total knee arthroplasty Left 09/16/2012    Procedure: LEFT TOTAL KNEE ARTHROPLASTY;  Surgeon: Gearlean Alf, MD;  Location: [facility];  Service: Orthopedics;  Laterality: Left;  . Cataract extraction Bilateral 2015    FAMHx:  Family History  Problem Relation Age of Onset  . Cirrhosis Mother     etiology unclear  . Hypertension Father   . Stroke Father   . Cancer Sister     breast  . Stroke Maternal Grandmother   . Heart disease Maternal Grandfather     MI  .  Stroke Paternal Grandmother   . Stroke Paternal Grandfather   . Anesthesia problems Neg Hx   . Hypotension Neg Hx   . Malignant hyperthermia Neg Hx   . Pseudochol deficiency Neg Hx     SOCHx:   reports that he has never smoked. He has never used smokeless tobacco. He reports that he does not drink alcohol or use illicit drugs.  ALLERGIES:  Allergies  Allergen Reactions  . Altace [Ramipril] Other (See Comments)    hyperkalemia     ROS: A comprehensive review of systems was negative except for: Constitutional: positive for fatigue Cardiovascular: positive for chest pain, lower extremity edema and syncope Neurological: positive for dizziness and neuropathy, foot drop  HOME MEDS: Current Outpatient Prescriptions  Medication Sig Dispense Refill  . ALREX 0.2 % SUSP Place 1 drop into both eyes daily.      Marland Kitchen aspirin 81 MG tablet Take 81 mg by mouth daily.      Marland Kitchen atorvastatin (LIPITOR) 40 MG tablet TAKE 1 TABLET AT BEDTIME  90 tablet  1  . diclofenac (VOLTAREN) 75 MG EC tablet TAKE 1 TABLET TWICE A DAY  180 tablet  0  . diltiazem (CARDIZEM LA) 180 MG 24 hr tablet Take 1 tablet (180 mg total) by mouth daily.  90 tablet  3  . glyBURIDE (DIABETA) 5 MG tablet TAKE 1 TABLET DAILY WITH BREAKFAST  90 tablet  1  . ketoconazole (NIZORAL) 2 % shampoo APPLY 2 TIMES A WEEK  120 mL  3  . metFORMIN (GLUCOPHAGE) 1000 MG tablet TAKE 1 TABLET TWICE A DAY WITH MEALS  180 tablet  1  . metoprolol succinate (TOPROL-XL) 25 MG 24 hr tablet TAKE 1 TABLET DAILY  90 tablet  1  . Olopatadine HCl (PATADAY) 0.2 % SOLN Apply 1 drop to eye 2 (two) times daily.      . Omega-3 Fatty Acids (FISH OIL BURP-LESS) 1000 MG CAPS Take by mouth daily.      Marland Kitchen omeprazole (PRILOSEC) 20 MG capsule TAKE 1 CAPSULE TWICE DAILY  180 capsule  1  . [DISCONTINUED] testosterone cypionate (DEPO-TESTOSTERONE) 200 MG/ML injection Inject 1 mL (200 mg total) into the muscle every 30 (thirty) days.  10 mL  0   No current facility-administered  medications for this visit.    LABS/IMAGING: No results found for this or any previous visit (from the past 48 hour(s)). No results found.  VITALS: BP 138/70  Pulse 75  Ht 5\' 9"  (1.753 m)  Wt 164 lb 3.2 oz (74.481 kg)  BMI 24.24 kg/m2  EXAM: Gen.: Awake, in no apparent distress HEENT: PERRLA, EOMI, no carotid bruit Lungs: Clear bilaterally Heart:  Regular rate and rhythm normal D6-K3, 3/6 systolic murmur at the left lower sternal border Abdomen: Obese, protuberant, no masses Extremities: Trace bilateral edema with a left foot brace in place Pulses: 2+ bilateral Neuro: Grossly normal Psychiatric: Normal mood and affect  EKG: Normal sinus rhythm at 75  ASSESSMENT: 1. Syncope, likely vasovagal 2. Coronary artery disease status post CABG in 2004 3. Diabetes type 2 4. Peripheral neuropathy 5. HTN - controlled 6. Dyslipidemia 7. Low risk nuclear stress test 12/11/2012 8. Murmur  PLAN: 1.   Mr. Centola is doing well. He's had no further syncopal events. He denies any chest pain. Stress test was low risk last year. He does have a systolic murmur which was appreciated by echo about 4 years ago it was noted that he had moderate mitral regurgitation at the time. It does sound somewhat louder to me today I would recommend a repeat echocardiogram. His hypertension is well controlled. He is due for repeat check of his cholesterol and liver function. We will go ahead and order a CMP and lipid profile today. Plan to see him back annually or sooner as necessary I will contact him with results of his echo.  Pixie Casino, MD, [facility] Attending Cardiologist [facility] 12/25/2013, 6:02 PM

## 2013-12-29 ENCOUNTER — Other Ambulatory Visit: Payer: Medicare Other

## 2014-01-01 ENCOUNTER — Ambulatory Visit (HOSPITAL_COMMUNITY)
Admission: RE | Admit: 2014-01-01 | Discharge: 2014-01-01 | Disposition: A | Discharge status: Home / Self Care | Payer: Medicare Other | Source: Ambulatory Visit | Attending: Internal Medicine | Admitting: Internal Medicine

## 2014-01-01 DIAGNOSIS — I10 Essential (primary) hypertension: Secondary | ICD-10-CM | POA: Diagnosis not present

## 2014-01-01 DIAGNOSIS — I059 Rheumatic mitral valve disease, unspecified: Secondary | ICD-10-CM

## 2014-01-01 DIAGNOSIS — R011 Cardiac murmur, unspecified: Secondary | ICD-10-CM | POA: Diagnosis not present

## 2014-01-01 NOTE — Progress Notes (Signed)
2D Echo Performed 01/01/2014    Marygrace Drought, RCS

## 2014-01-08 DIAGNOSIS — L218 Other seborrheic dermatitis: Secondary | ICD-10-CM | POA: Diagnosis not present

## 2014-01-08 DIAGNOSIS — L821 Other seborrheic keratosis: Secondary | ICD-10-CM | POA: Diagnosis not present

## 2014-01-15 ENCOUNTER — Ambulatory Visit (INDEPENDENT_AMBULATORY_CARE_PROVIDER_SITE_OTHER): Payer: Medicare Other

## 2014-01-15 DIAGNOSIS — Z23 Encounter for immunization: Secondary | ICD-10-CM

## 2014-01-27 ENCOUNTER — Telehealth: Payer: Self-pay | Admitting: Internal Medicine

## 2014-01-27 NOTE — Telephone Encounter (Signed)
[id number] [place], [place] - [address] is requesting 90 day re-fill on diltiazem (CARDIZEM LA) 180 MG 24 hr tablet

## 2014-01-28 MED ORDER — DILTIAZEM HCL ER COATED BEADS 180 MG PO TB24: 180.0000 mg | ORAL_TABLET | Freq: Every day | ORAL | Status: DC

## 2014-01-28 NOTE — Telephone Encounter (Signed)
Medication refilled

## 2014-03-25 ENCOUNTER — Ambulatory Visit (INDEPENDENT_AMBULATORY_CARE_PROVIDER_SITE_OTHER): Payer: Medicare Other | Admitting: Family Medicine

## 2014-03-25 ENCOUNTER — Encounter: Payer: Self-pay | Admitting: Family Medicine

## 2014-03-25 VITALS — BP 130/66 | HR 68 | Temp 98.4°F | Wt 166.0 lb

## 2014-03-25 DIAGNOSIS — E119 Type 2 diabetes mellitus without complications: Secondary | ICD-10-CM

## 2014-03-25 DIAGNOSIS — E785 Hyperlipidemia, unspecified: Secondary | ICD-10-CM | POA: Diagnosis not present

## 2014-03-25 DIAGNOSIS — I10 Essential (primary) hypertension: Secondary | ICD-10-CM

## 2014-03-25 LAB — HEMOGLOBIN A1C: HEMOGLOBIN A1C: 7.2 % — AB (ref 4.6–6.5)

## 2014-03-25 NOTE — Assessment & Plan Note (Signed)
A1c mildly trending up to 7.2 from 7. Patient does describe what seemed to be intermittent hypoglycemia have asked him to check to see what his blood sugar is when he is feeling this. Currently I'm concerned about increasing glyburide given the potential hypoglycemia so we will hold steady and metformin 2 g total daily and glyburide 5 mg in the morning. Follow-up in 3 months and consider perhaps 2.5 mg glyburide in the evening if patient is not having hyperglycemia.

## 2014-03-25 NOTE — Assessment & Plan Note (Signed)
Continue atorvastatin 40 mg as well controlled

## 2014-03-25 NOTE — Assessment & Plan Note (Signed)
Well-controlled on metoprolol and diltiazem. He is on diltiazem and apparently used to have hyperkalemia I assume on likely an ACE inhibitor

## 2014-03-25 NOTE — Progress Notes (Signed)
Steven Reddish, MD Phone: [phone]  Subjective:  Patient presents today to establish care with me as their new primary care provider. Patient was formerly a patient of Dr. Leanne Chang. Chief complaint-noted.   DIABETES Type II-mild poor control  Lab Results  Component Value Date   HGBA1C 7.2* 03/25/2014   HGBA1C 7.0* 09/03/2013   HGBA1C 6.3 02/03/2013   Medications taking and tolerating-yes, metformin 1g twice a day Blood Sugars per patient-fasting- does not check Regular Exercise-no On Aspirin-yes On statin-y Daily foot monitoring-yes  ROS- Denies Polyuria,Vision changes. Endorses occasionally feeling mildly weak and eats peanut butter and banana but does not check blood sugar. Also admits this is avoided if he eats regularly.   Hypertension-good control  BP Readings from Last 3 Encounters:  03/25/14 130/66  12/25/13 138/70  09/03/13 124/72   Home BP monitoring-no Compliant with medications-yes without side effects ROS-Denies any CP, HA, SOB, blurry vision.   Hyperlipidemia-well controlled  Lab Results  Component Value Date   LDLCALC 79 09/03/2013  On statin: atorvastatin 40mg  ROS- no chest pain or shortness of breath. No myalgias  Patient also updates me on the following: Back surgery 2013 Knees 2013, 2014 Dr. Hessie Knows catarcts 2015  Foot drop started in between back surgery and knee surgery. Wears brace.  Scoliosis mild worsening  History of high potassium changed to diltiazem B  The following were reviewed and entered/updated in epic: Past Medical History  Diagnosis Date  . Diabetes mellitus     type II  . GERD (gastroesophageal reflux disease)   . Hyperlipidemia   . History of myocardial infarction   . Myocardial infarction 01-03-12    '04  . CAD (coronary artery disease) 2004    s/p inferior wall Mi 2004 with PTCA/Stent; s/p CABG 2004  . Arthritis 01-03-12    osteoarthritis-knees  . Neuropathy 01-03-12    bil. feet  . MYOCARDIAL INFARCTION, HX OF  04/19/2007    Qualifier: Diagnosis of  By: Scherrie Gerlach    . Heart disease   . Degenerative arthritis   . Polyneuropathy in diabetes(357.2) 08/23/2012  . Foot drop, bilateral 08/23/2012  . Abnormality of gait 08/23/2012  . Pneumonia     hx of  . History of kidney stones   . Anemia     hx of  . Radiculopathy of lumbar region 08/23/2011    Followed by Dr. Vertell Limber. S/p surgery. Resolved after surgery.     Patient Active Problem List   Diagnosis Date Noted  . CAD, ARTERY BYPASS GRAFT 05/21/2009    Priority: High  . Carotid artery stenosis 04/20/2008    Priority: High  . Diabetes mellitus type II, controlled 04/19/2007    Priority: High  . Anemia 09/03/2013    Priority: Medium  . Essential hypertension, benign 10/11/2012    Priority: Medium  . SVT (supraventricular tachycardia) 01/25/2011    Priority: Medium  . Hyperlipidemia 04/19/2007    Priority: Medium  . Foot drop, left 08/23/2012    Priority: Low  . Hypogonadism in male 02/04/2010    Priority: Low  . History of colonic polyps 09/07/2009    Priority: Low  . GERD 04/19/2007    Priority: Low   Past Surgical History  Procedure Laterality Date  . Coronary artery bypass graft  2004    LIMA to LAD; SVG to ramus intermedius; SVG to PDA/PLSA  . Tonsillectomy  as child  . Colonoscopy    . Laminectomy  09/08/2011    Procedure: LUMBAR LAMINECTOMY FOR TUMOR;  Surgeon: Erline Levine, MD;  Location: [facility];  Service: Neurosurgery;  Laterality: Left;  Left Thoracic twelve-Lumbar one transpedicular resection of epidural mass  . Cardiac catheterization  01-03-12    '04  . Total knee arthroplasty  01/15/2012    Procedure: TOTAL KNEE ARTHROPLASTY;  Surgeon: Gearlean Alf, MD;  Location: [facility];  Service: Orthopedics;  Laterality: Right;  . Back surgery  2013  . Total knee arthroplasty Left 09/16/2012    Procedure: LEFT TOTAL KNEE ARTHROPLASTY;  Surgeon: Gearlean Alf, MD;  Location: [facility];  Service: Orthopedics;  Laterality: Left;  .  Cataract extraction Bilateral 2015    Family History  Problem Relation Age of Onset  . Cirrhosis Mother     etiology unclear  . Hypertension Father   . Stroke Father   . Cancer Sister     breast  . Stroke Maternal Grandmother   . Heart disease Maternal Grandfather     MI  . Stroke Paternal Grandmother   . Stroke Paternal Grandfather   . Anesthesia problems Neg Hx   . Hypotension Neg Hx   . Malignant hyperthermia Neg Hx   . Pseudochol deficiency Neg Hx     Medications- reviewed and updated Current Outpatient Prescriptions  Medication Sig Dispense Refill  . aspirin 81 MG tablet Take 81 mg by mouth daily.    Marland Kitchen atorvastatin (LIPITOR) 40 MG tablet TAKE 1 TABLET AT BEDTIME 90 tablet 1  . cycloSPORINE (RESTASIS) 0.05 % ophthalmic emulsion 1 drop 2 (two) times daily.    Marland Kitchen diltiazem (CARDIZEM LA) 180 MG 24 hr tablet Take 1 tablet (180 mg total) by mouth daily. 90 tablet 2  . glyBURIDE (DIABETA) 5 MG tablet TAKE 1 TABLET DAILY WITH BREAKFAST 90 tablet 1  . ketoconazole (NIZORAL) 2 % shampoo APPLY 2 TIMES A WEEK 120 mL 3  . metFORMIN (GLUCOPHAGE) 1000 MG tablet TAKE 1 TABLET TWICE A DAY WITH MEALS 180 tablet 1  . metoprolol succinate (TOPROL-XL) 25 MG 24 hr tablet TAKE 1 TABLET DAILY 90 tablet 1  . omeprazole (PRILOSEC) 20 MG capsule TAKE 1 CAPSULE TWICE DAILY 180 capsule 1  . [DISCONTINUED] testosterone cypionate (DEPO-TESTOSTERONE) 200 MG/ML injection Inject 1 mL (200 mg total) into the muscle every 30 (thirty) days. 10 mL 0   No current facility-administered medications for this visit.    Allergies-reviewed and updated Allergies  Allergen Reactions  . Altace [Ramipril] Other (See Comments)    hyperkalemia     History   Social History  . Marital Status: Married    Spouse Name: N/A    Number of Children: 2  . Years of Education: College   Occupational History  . Research scientist (life sciences)    Social History Main Topics  . Smoking status: Never Smoker   . Smokeless tobacco: Never  Used  . Alcohol Use: No  . Drug Use: No  . Sexual Activity: Not Currently   Other Topics Concern  . Not on file   Social History Narrative   Married 1956.  2 children. No grandkids. Neither children married.    1 daughter lives at house and watches over parents.       Retired from [employer]. Worked for hisself afterwards.       Regular exercise: no   Daily Caffeine: 3 cups coffee daily    ROS--See HPI   Objective: BP 130/66 mmHg  Pulse 68  Temp(Src) 98.4 F (36.9 C)  Wt 166 lb (75.297 kg) Gen: NAD,  resting comfortably, appears stated age 80: Mucous membranes are moist. Oropharynx normal.  CV: RRR no murmurs rubs or gallops Lungs: CTAB no crackles, wheeze, rhonchi Abdomen: soft/nontender/nondistended/normal bowel sounds.  Ext: 1+ edema bilaterally R >L (history CABG, vein removal) Skin: warm, dry, no rash  Neuro: grossly normal, moves all extremities, PERRLA   Assessment/Plan:  Diabetes mellitus type II, controlled A1c mildly trending up to 7.2 from 7. Patient does describe what seemed to be intermittent hypoglycemia have asked him to check to see what his blood sugar is when he is feeling this. Currently I'm concerned about increasing glyburide given the potential hypoglycemia so we will hold steady and metformin 2 g total daily and glyburide 5 mg in the morning. Follow-up in 3 months and consider perhaps 2.5 mg glyburide in the evening if patient is not having hyperglycemia.   Essential hypertension, benign Well-controlled on metoprolol and diltiazem. He is on diltiazem and apparently used to have hyperkalemia I assume on likely an ACE inhibitor  Hyperlipidemia Continue atorvastatin 40 mg as well controlled   Follow up in 3 months instead of 6 given a1c trend.

## 2014-03-25 NOTE — Patient Instructions (Signed)
Things overall look great. Let's check your a1c today. Want to keep you below 7.5. Make sure to not miss meals or snacks and be aware of your blood sugar. Check if you feel like it is low so we can know how low you are getting.

## 2014-04-14 DIAGNOSIS — L218 Other seborrheic dermatitis: Secondary | ICD-10-CM | POA: Diagnosis not present

## 2014-04-14 DIAGNOSIS — B078 Other viral warts: Secondary | ICD-10-CM | POA: Diagnosis not present

## 2014-04-14 DIAGNOSIS — L821 Other seborrheic keratosis: Secondary | ICD-10-CM | POA: Diagnosis not present

## 2014-04-14 DIAGNOSIS — I788 Other diseases of capillaries: Secondary | ICD-10-CM | POA: Diagnosis not present

## 2014-06-05 DIAGNOSIS — M791 Myalgia: Secondary | ICD-10-CM | POA: Diagnosis not present

## 2014-06-25 DIAGNOSIS — Z96651 Presence of right artificial knee joint: Secondary | ICD-10-CM | POA: Diagnosis not present

## 2014-06-25 DIAGNOSIS — M791 Myalgia: Secondary | ICD-10-CM | POA: Diagnosis not present

## 2014-06-25 DIAGNOSIS — M25561 Pain in right knee: Secondary | ICD-10-CM | POA: Diagnosis not present

## 2014-06-26 ENCOUNTER — Ambulatory Visit: Payer: Medicare Other | Admitting: Family Medicine

## 2014-07-07 DIAGNOSIS — R040 Epistaxis: Secondary | ICD-10-CM | POA: Diagnosis not present

## 2014-07-13 DIAGNOSIS — R040 Epistaxis: Secondary | ICD-10-CM | POA: Diagnosis not present

## 2014-07-14 ENCOUNTER — Ambulatory Visit: Payer: Medicare Other | Admitting: Family Medicine

## 2014-07-20 DIAGNOSIS — H5201 Hypermetropia, right eye: Secondary | ICD-10-CM | POA: Diagnosis not present

## 2014-07-20 DIAGNOSIS — H52221 Regular astigmatism, right eye: Secondary | ICD-10-CM | POA: Diagnosis not present

## 2014-07-20 DIAGNOSIS — E119 Type 2 diabetes mellitus without complications: Secondary | ICD-10-CM | POA: Diagnosis not present

## 2014-07-20 LAB — HM DIABETES EYE EXAM

## 2014-07-21 DIAGNOSIS — M5136 Other intervertebral disc degeneration, lumbar region: Secondary | ICD-10-CM | POA: Diagnosis not present

## 2014-07-22 DIAGNOSIS — M5136 Other intervertebral disc degeneration, lumbar region: Secondary | ICD-10-CM | POA: Diagnosis not present

## 2014-07-22 DIAGNOSIS — M791 Myalgia: Secondary | ICD-10-CM | POA: Diagnosis not present

## 2014-07-27 ENCOUNTER — Encounter: Payer: Self-pay | Admitting: Internal Medicine

## 2014-07-28 ENCOUNTER — Encounter: Payer: Self-pay | Admitting: Family Medicine

## 2014-07-28 DIAGNOSIS — M5416 Radiculopathy, lumbar region: Secondary | ICD-10-CM | POA: Diagnosis not present

## 2014-08-09 ENCOUNTER — Other Ambulatory Visit: Payer: Self-pay | Admitting: Internal Medicine

## 2014-08-11 DIAGNOSIS — M4806 Spinal stenosis, lumbar region: Secondary | ICD-10-CM | POA: Diagnosis not present

## 2014-08-11 DIAGNOSIS — M791 Myalgia: Secondary | ICD-10-CM | POA: Diagnosis not present

## 2014-08-11 DIAGNOSIS — M5136 Other intervertebral disc degeneration, lumbar region: Secondary | ICD-10-CM | POA: Diagnosis not present

## 2014-08-14 ENCOUNTER — Ambulatory Visit (HOSPITAL_COMMUNITY)
Admission: RE | Admit: 2014-08-14 | Discharge: 2014-08-14 | Disposition: A | Discharge status: Home / Self Care | Payer: Medicare Other | Source: Ambulatory Visit | Attending: Internal Medicine | Admitting: Internal Medicine

## 2014-08-14 DIAGNOSIS — I6529 Occlusion and stenosis of unspecified carotid artery: Secondary | ICD-10-CM

## 2014-08-14 DIAGNOSIS — I6521 Occlusion and stenosis of right carotid artery: Secondary | ICD-10-CM | POA: Diagnosis not present

## 2014-08-14 NOTE — Progress Notes (Signed)
Carotid duplex completed. [place]

## 2014-08-25 ENCOUNTER — Other Ambulatory Visit: Payer: Self-pay | Admitting: *Deleted

## 2014-08-25 DIAGNOSIS — M791 Myalgia: Secondary | ICD-10-CM | POA: Diagnosis not present

## 2014-08-25 DIAGNOSIS — M5136 Other intervertebral disc degeneration, lumbar region: Secondary | ICD-10-CM | POA: Diagnosis not present

## 2014-08-25 DIAGNOSIS — M4806 Spinal stenosis, lumbar region: Secondary | ICD-10-CM | POA: Diagnosis not present

## 2014-08-25 DIAGNOSIS — I6521 Occlusion and stenosis of right carotid artery: Secondary | ICD-10-CM

## 2014-09-18 ENCOUNTER — Other Ambulatory Visit: Payer: Self-pay | Admitting: Internal Medicine

## 2014-09-18 ENCOUNTER — Other Ambulatory Visit: Payer: Self-pay | Admitting: Family Medicine

## 2014-10-21 DIAGNOSIS — Z6825 Body mass index (BMI) 25.0-25.9, adult: Secondary | ICD-10-CM | POA: Diagnosis not present

## 2014-10-21 DIAGNOSIS — Z13820 Encounter for screening for osteoporosis: Secondary | ICD-10-CM | POA: Diagnosis not present

## 2014-10-21 DIAGNOSIS — M21371 Foot drop, right foot: Secondary | ICD-10-CM | POA: Diagnosis not present

## 2014-10-21 DIAGNOSIS — M4120 Other idiopathic scoliosis, site unspecified: Secondary | ICD-10-CM | POA: Diagnosis not present

## 2014-10-21 DIAGNOSIS — I10 Essential (primary) hypertension: Secondary | ICD-10-CM | POA: Diagnosis not present

## 2014-10-24 ENCOUNTER — Other Ambulatory Visit: Payer: Self-pay | Admitting: Family Medicine

## 2014-10-26 ENCOUNTER — Other Ambulatory Visit: Payer: Self-pay | Admitting: Neurosurgery

## 2014-10-26 DIAGNOSIS — M8580 Other specified disorders of bone density and structure, unspecified site: Secondary | ICD-10-CM

## 2014-10-26 DIAGNOSIS — R040 Epistaxis: Secondary | ICD-10-CM | POA: Diagnosis not present

## 2014-10-30 DIAGNOSIS — G629 Polyneuropathy, unspecified: Secondary | ICD-10-CM | POA: Diagnosis not present

## 2014-10-30 DIAGNOSIS — M4806 Spinal stenosis, lumbar region: Secondary | ICD-10-CM | POA: Diagnosis not present

## 2014-10-30 DIAGNOSIS — M5416 Radiculopathy, lumbar region: Secondary | ICD-10-CM | POA: Diagnosis not present

## 2014-11-02 ENCOUNTER — Other Ambulatory Visit: Payer: Self-pay | Admitting: Neurosurgery

## 2014-11-02 DIAGNOSIS — M8580 Other specified disorders of bone density and structure, unspecified site: Secondary | ICD-10-CM

## 2014-11-02 DIAGNOSIS — M810 Age-related osteoporosis without current pathological fracture: Secondary | ICD-10-CM

## 2014-11-05 DIAGNOSIS — R262 Difficulty in walking, not elsewhere classified: Secondary | ICD-10-CM | POA: Diagnosis not present

## 2014-11-05 DIAGNOSIS — M6281 Muscle weakness (generalized): Secondary | ICD-10-CM | POA: Diagnosis not present

## 2014-11-05 DIAGNOSIS — M62561 Muscle wasting and atrophy, not elsewhere classified, right lower leg: Secondary | ICD-10-CM | POA: Diagnosis not present

## 2014-11-05 DIAGNOSIS — R2681 Unsteadiness on feet: Secondary | ICD-10-CM | POA: Diagnosis not present

## 2014-11-06 ENCOUNTER — Ambulatory Visit
Admission: RE | Admit: 2014-11-06 | Discharge: 2014-11-06 | Disposition: A | Discharge status: Home / Self Care | Payer: Medicare Other | Source: Ambulatory Visit | Attending: Neurosurgery | Admitting: Neurosurgery

## 2014-11-06 DIAGNOSIS — M810 Age-related osteoporosis without current pathological fracture: Secondary | ICD-10-CM | POA: Diagnosis not present

## 2014-11-06 LAB — HM DEXA SCAN

## 2014-11-10 DIAGNOSIS — M6281 Muscle weakness (generalized): Secondary | ICD-10-CM | POA: Diagnosis not present

## 2014-11-10 DIAGNOSIS — R262 Difficulty in walking, not elsewhere classified: Secondary | ICD-10-CM | POA: Diagnosis not present

## 2014-11-10 DIAGNOSIS — R2681 Unsteadiness on feet: Secondary | ICD-10-CM | POA: Diagnosis not present

## 2014-11-10 DIAGNOSIS — M62561 Muscle wasting and atrophy, not elsewhere classified, right lower leg: Secondary | ICD-10-CM | POA: Diagnosis not present

## 2014-11-11 DIAGNOSIS — R2681 Unsteadiness on feet: Secondary | ICD-10-CM | POA: Diagnosis not present

## 2014-11-11 DIAGNOSIS — M6281 Muscle weakness (generalized): Secondary | ICD-10-CM | POA: Diagnosis not present

## 2014-11-11 DIAGNOSIS — M62561 Muscle wasting and atrophy, not elsewhere classified, right lower leg: Secondary | ICD-10-CM | POA: Diagnosis not present

## 2014-11-11 DIAGNOSIS — R262 Difficulty in walking, not elsewhere classified: Secondary | ICD-10-CM | POA: Diagnosis not present

## 2014-11-12 DIAGNOSIS — R2681 Unsteadiness on feet: Secondary | ICD-10-CM | POA: Diagnosis not present

## 2014-11-12 DIAGNOSIS — M62561 Muscle wasting and atrophy, not elsewhere classified, right lower leg: Secondary | ICD-10-CM | POA: Diagnosis not present

## 2014-11-12 DIAGNOSIS — R262 Difficulty in walking, not elsewhere classified: Secondary | ICD-10-CM | POA: Diagnosis not present

## 2014-11-12 DIAGNOSIS — M6281 Muscle weakness (generalized): Secondary | ICD-10-CM | POA: Diagnosis not present

## 2014-11-16 DIAGNOSIS — R2681 Unsteadiness on feet: Secondary | ICD-10-CM | POA: Diagnosis not present

## 2014-11-16 DIAGNOSIS — M62561 Muscle wasting and atrophy, not elsewhere classified, right lower leg: Secondary | ICD-10-CM | POA: Diagnosis not present

## 2014-11-16 DIAGNOSIS — M6281 Muscle weakness (generalized): Secondary | ICD-10-CM | POA: Diagnosis not present

## 2014-11-16 DIAGNOSIS — R262 Difficulty in walking, not elsewhere classified: Secondary | ICD-10-CM | POA: Diagnosis not present

## 2014-11-17 DIAGNOSIS — M62561 Muscle wasting and atrophy, not elsewhere classified, right lower leg: Secondary | ICD-10-CM | POA: Diagnosis not present

## 2014-11-17 DIAGNOSIS — M6281 Muscle weakness (generalized): Secondary | ICD-10-CM | POA: Diagnosis not present

## 2014-11-17 DIAGNOSIS — R2681 Unsteadiness on feet: Secondary | ICD-10-CM | POA: Diagnosis not present

## 2014-11-17 DIAGNOSIS — R262 Difficulty in walking, not elsewhere classified: Secondary | ICD-10-CM | POA: Diagnosis not present

## 2014-11-19 DIAGNOSIS — R262 Difficulty in walking, not elsewhere classified: Secondary | ICD-10-CM | POA: Diagnosis not present

## 2014-11-19 DIAGNOSIS — M6281 Muscle weakness (generalized): Secondary | ICD-10-CM | POA: Diagnosis not present

## 2014-11-19 DIAGNOSIS — R2681 Unsteadiness on feet: Secondary | ICD-10-CM | POA: Diagnosis not present

## 2014-11-19 DIAGNOSIS — M62561 Muscle wasting and atrophy, not elsewhere classified, right lower leg: Secondary | ICD-10-CM | POA: Diagnosis not present

## 2014-11-25 DIAGNOSIS — M6281 Muscle weakness (generalized): Secondary | ICD-10-CM | POA: Diagnosis not present

## 2014-11-25 DIAGNOSIS — R262 Difficulty in walking, not elsewhere classified: Secondary | ICD-10-CM | POA: Diagnosis not present

## 2014-11-25 DIAGNOSIS — R2681 Unsteadiness on feet: Secondary | ICD-10-CM | POA: Diagnosis not present

## 2014-11-25 DIAGNOSIS — M62561 Muscle wasting and atrophy, not elsewhere classified, right lower leg: Secondary | ICD-10-CM | POA: Diagnosis not present

## 2014-11-26 DIAGNOSIS — M62561 Muscle wasting and atrophy, not elsewhere classified, right lower leg: Secondary | ICD-10-CM | POA: Diagnosis not present

## 2014-11-26 DIAGNOSIS — R262 Difficulty in walking, not elsewhere classified: Secondary | ICD-10-CM | POA: Diagnosis not present

## 2014-11-26 DIAGNOSIS — R2681 Unsteadiness on feet: Secondary | ICD-10-CM | POA: Diagnosis not present

## 2014-11-26 DIAGNOSIS — M6281 Muscle weakness (generalized): Secondary | ICD-10-CM | POA: Diagnosis not present

## 2014-11-27 DIAGNOSIS — M6281 Muscle weakness (generalized): Secondary | ICD-10-CM | POA: Diagnosis not present

## 2014-11-27 DIAGNOSIS — R262 Difficulty in walking, not elsewhere classified: Secondary | ICD-10-CM | POA: Diagnosis not present

## 2014-11-27 DIAGNOSIS — R2681 Unsteadiness on feet: Secondary | ICD-10-CM | POA: Diagnosis not present

## 2014-11-27 DIAGNOSIS — M62561 Muscle wasting and atrophy, not elsewhere classified, right lower leg: Secondary | ICD-10-CM | POA: Diagnosis not present

## 2014-11-30 DIAGNOSIS — R262 Difficulty in walking, not elsewhere classified: Secondary | ICD-10-CM | POA: Diagnosis not present

## 2014-11-30 DIAGNOSIS — R2681 Unsteadiness on feet: Secondary | ICD-10-CM | POA: Diagnosis not present

## 2014-11-30 DIAGNOSIS — M62561 Muscle wasting and atrophy, not elsewhere classified, right lower leg: Secondary | ICD-10-CM | POA: Diagnosis not present

## 2014-11-30 DIAGNOSIS — M6281 Muscle weakness (generalized): Secondary | ICD-10-CM | POA: Diagnosis not present

## 2014-11-30 DIAGNOSIS — R040 Epistaxis: Secondary | ICD-10-CM | POA: Diagnosis not present

## 2014-12-01 DIAGNOSIS — M6281 Muscle weakness (generalized): Secondary | ICD-10-CM | POA: Diagnosis not present

## 2014-12-01 DIAGNOSIS — M62561 Muscle wasting and atrophy, not elsewhere classified, right lower leg: Secondary | ICD-10-CM | POA: Diagnosis not present

## 2014-12-01 DIAGNOSIS — R262 Difficulty in walking, not elsewhere classified: Secondary | ICD-10-CM | POA: Diagnosis not present

## 2014-12-01 DIAGNOSIS — R2681 Unsteadiness on feet: Secondary | ICD-10-CM | POA: Diagnosis not present

## 2014-12-07 DIAGNOSIS — M6281 Muscle weakness (generalized): Secondary | ICD-10-CM | POA: Diagnosis not present

## 2014-12-07 DIAGNOSIS — R262 Difficulty in walking, not elsewhere classified: Secondary | ICD-10-CM | POA: Diagnosis not present

## 2014-12-07 DIAGNOSIS — M62561 Muscle wasting and atrophy, not elsewhere classified, right lower leg: Secondary | ICD-10-CM | POA: Diagnosis not present

## 2014-12-07 DIAGNOSIS — R2681 Unsteadiness on feet: Secondary | ICD-10-CM | POA: Diagnosis not present

## 2014-12-08 DIAGNOSIS — R2681 Unsteadiness on feet: Secondary | ICD-10-CM | POA: Diagnosis not present

## 2014-12-08 DIAGNOSIS — R262 Difficulty in walking, not elsewhere classified: Secondary | ICD-10-CM | POA: Diagnosis not present

## 2014-12-08 DIAGNOSIS — M6281 Muscle weakness (generalized): Secondary | ICD-10-CM | POA: Diagnosis not present

## 2014-12-08 DIAGNOSIS — M62561 Muscle wasting and atrophy, not elsewhere classified, right lower leg: Secondary | ICD-10-CM | POA: Diagnosis not present

## 2014-12-10 DIAGNOSIS — R2681 Unsteadiness on feet: Secondary | ICD-10-CM | POA: Diagnosis not present

## 2014-12-10 DIAGNOSIS — M6281 Muscle weakness (generalized): Secondary | ICD-10-CM | POA: Diagnosis not present

## 2014-12-10 DIAGNOSIS — R262 Difficulty in walking, not elsewhere classified: Secondary | ICD-10-CM | POA: Diagnosis not present

## 2014-12-10 DIAGNOSIS — M62561 Muscle wasting and atrophy, not elsewhere classified, right lower leg: Secondary | ICD-10-CM | POA: Diagnosis not present

## 2014-12-11 ENCOUNTER — Other Ambulatory Visit: Payer: Self-pay | Admitting: *Deleted

## 2014-12-11 ENCOUNTER — Other Ambulatory Visit: Payer: Self-pay

## 2014-12-11 MED ORDER — ONETOUCH ULTRASOFT LANCETS MISC
Status: DC
Start: 2014-12-11 — End: 2016-03-28

## 2014-12-11 MED ORDER — ONETOUCH ULTRASOFT LANCETS MISC: Status: DC

## 2014-12-11 MED ORDER — GLUCOSE BLOOD VI STRP: ORAL_STRIP | Status: DC

## 2014-12-11 MED ORDER — ONETOUCH ULTRASOFT LANCETS MISC
Status: DC
Start: 2014-12-11 — End: 2014-12-11

## 2014-12-11 NOTE — Telephone Encounter (Signed)
Strips and lancets refilled 

## 2014-12-16 ENCOUNTER — Other Ambulatory Visit: Payer: Self-pay | Admitting: Internal Medicine

## 2014-12-16 DIAGNOSIS — M6281 Muscle weakness (generalized): Secondary | ICD-10-CM | POA: Diagnosis not present

## 2014-12-16 DIAGNOSIS — R2681 Unsteadiness on feet: Secondary | ICD-10-CM | POA: Diagnosis not present

## 2014-12-16 DIAGNOSIS — R262 Difficulty in walking, not elsewhere classified: Secondary | ICD-10-CM | POA: Diagnosis not present

## 2014-12-16 DIAGNOSIS — M62561 Muscle wasting and atrophy, not elsewhere classified, right lower leg: Secondary | ICD-10-CM | POA: Diagnosis not present

## 2014-12-18 DIAGNOSIS — R2681 Unsteadiness on feet: Secondary | ICD-10-CM | POA: Diagnosis not present

## 2014-12-18 DIAGNOSIS — M6281 Muscle weakness (generalized): Secondary | ICD-10-CM | POA: Diagnosis not present

## 2014-12-18 DIAGNOSIS — M62561 Muscle wasting and atrophy, not elsewhere classified, right lower leg: Secondary | ICD-10-CM | POA: Diagnosis not present

## 2014-12-18 DIAGNOSIS — R262 Difficulty in walking, not elsewhere classified: Secondary | ICD-10-CM | POA: Diagnosis not present

## 2014-12-23 DIAGNOSIS — R2681 Unsteadiness on feet: Secondary | ICD-10-CM | POA: Diagnosis not present

## 2014-12-23 DIAGNOSIS — M6281 Muscle weakness (generalized): Secondary | ICD-10-CM | POA: Diagnosis not present

## 2014-12-23 DIAGNOSIS — M62561 Muscle wasting and atrophy, not elsewhere classified, right lower leg: Secondary | ICD-10-CM | POA: Diagnosis not present

## 2014-12-23 DIAGNOSIS — R262 Difficulty in walking, not elsewhere classified: Secondary | ICD-10-CM | POA: Diagnosis not present

## 2014-12-25 ENCOUNTER — Ambulatory Visit (INDEPENDENT_AMBULATORY_CARE_PROVIDER_SITE_OTHER): Payer: Medicare Other | Admitting: Family Medicine

## 2014-12-25 DIAGNOSIS — Z23 Encounter for immunization: Secondary | ICD-10-CM | POA: Diagnosis not present

## 2014-12-25 DIAGNOSIS — R2681 Unsteadiness on feet: Secondary | ICD-10-CM | POA: Diagnosis not present

## 2014-12-25 DIAGNOSIS — M62561 Muscle wasting and atrophy, not elsewhere classified, right lower leg: Secondary | ICD-10-CM | POA: Diagnosis not present

## 2014-12-25 DIAGNOSIS — M6281 Muscle weakness (generalized): Secondary | ICD-10-CM | POA: Diagnosis not present

## 2014-12-25 DIAGNOSIS — R262 Difficulty in walking, not elsewhere classified: Secondary | ICD-10-CM | POA: Diagnosis not present

## 2014-12-28 DIAGNOSIS — M4806 Spinal stenosis, lumbar region: Secondary | ICD-10-CM | POA: Diagnosis not present

## 2014-12-28 DIAGNOSIS — M21371 Foot drop, right foot: Secondary | ICD-10-CM | POA: Diagnosis not present

## 2014-12-28 DIAGNOSIS — I10 Essential (primary) hypertension: Secondary | ICD-10-CM | POA: Diagnosis not present

## 2014-12-28 DIAGNOSIS — M4120 Other idiopathic scoliosis, site unspecified: Secondary | ICD-10-CM | POA: Diagnosis not present

## 2014-12-28 DIAGNOSIS — Z6825 Body mass index (BMI) 25.0-25.9, adult: Secondary | ICD-10-CM | POA: Diagnosis not present

## 2014-12-29 DIAGNOSIS — R262 Difficulty in walking, not elsewhere classified: Secondary | ICD-10-CM | POA: Diagnosis not present

## 2014-12-29 DIAGNOSIS — M6281 Muscle weakness (generalized): Secondary | ICD-10-CM | POA: Diagnosis not present

## 2014-12-29 DIAGNOSIS — M62561 Muscle wasting and atrophy, not elsewhere classified, right lower leg: Secondary | ICD-10-CM | POA: Diagnosis not present

## 2014-12-29 DIAGNOSIS — R2681 Unsteadiness on feet: Secondary | ICD-10-CM | POA: Diagnosis not present

## 2014-12-30 ENCOUNTER — Encounter: Payer: Self-pay | Admitting: Family Medicine

## 2014-12-30 DIAGNOSIS — M62561 Muscle wasting and atrophy, not elsewhere classified, right lower leg: Secondary | ICD-10-CM | POA: Diagnosis not present

## 2014-12-30 DIAGNOSIS — R262 Difficulty in walking, not elsewhere classified: Secondary | ICD-10-CM | POA: Diagnosis not present

## 2014-12-30 DIAGNOSIS — M6281 Muscle weakness (generalized): Secondary | ICD-10-CM | POA: Diagnosis not present

## 2014-12-30 DIAGNOSIS — R2681 Unsteadiness on feet: Secondary | ICD-10-CM | POA: Diagnosis not present

## 2015-01-01 DIAGNOSIS — R262 Difficulty in walking, not elsewhere classified: Secondary | ICD-10-CM | POA: Diagnosis not present

## 2015-01-01 DIAGNOSIS — M6281 Muscle weakness (generalized): Secondary | ICD-10-CM | POA: Diagnosis not present

## 2015-01-01 DIAGNOSIS — M62561 Muscle wasting and atrophy, not elsewhere classified, right lower leg: Secondary | ICD-10-CM | POA: Diagnosis not present

## 2015-01-01 DIAGNOSIS — R2681 Unsteadiness on feet: Secondary | ICD-10-CM | POA: Diagnosis not present

## 2015-01-04 DIAGNOSIS — R262 Difficulty in walking, not elsewhere classified: Secondary | ICD-10-CM | POA: Diagnosis not present

## 2015-01-04 DIAGNOSIS — M6281 Muscle weakness (generalized): Secondary | ICD-10-CM | POA: Diagnosis not present

## 2015-01-04 DIAGNOSIS — M62561 Muscle wasting and atrophy, not elsewhere classified, right lower leg: Secondary | ICD-10-CM | POA: Diagnosis not present

## 2015-01-04 DIAGNOSIS — R2681 Unsteadiness on feet: Secondary | ICD-10-CM | POA: Diagnosis not present

## 2015-01-05 DIAGNOSIS — R2681 Unsteadiness on feet: Secondary | ICD-10-CM | POA: Diagnosis not present

## 2015-01-05 DIAGNOSIS — M6281 Muscle weakness (generalized): Secondary | ICD-10-CM | POA: Diagnosis not present

## 2015-01-05 DIAGNOSIS — M62561 Muscle wasting and atrophy, not elsewhere classified, right lower leg: Secondary | ICD-10-CM | POA: Diagnosis not present

## 2015-01-05 DIAGNOSIS — R262 Difficulty in walking, not elsewhere classified: Secondary | ICD-10-CM | POA: Diagnosis not present

## 2015-01-07 ENCOUNTER — Ambulatory Visit (INDEPENDENT_AMBULATORY_CARE_PROVIDER_SITE_OTHER): Payer: Medicare Other | Admitting: Family Medicine

## 2015-01-07 ENCOUNTER — Encounter: Payer: Self-pay | Admitting: Family Medicine

## 2015-01-07 VITALS — BP 146/68 | HR 86 | Temp 98.0°F | Wt 163.0 lb

## 2015-01-07 DIAGNOSIS — M6281 Muscle weakness (generalized): Secondary | ICD-10-CM | POA: Diagnosis not present

## 2015-01-07 DIAGNOSIS — M7989 Other specified soft tissue disorders: Secondary | ICD-10-CM

## 2015-01-07 DIAGNOSIS — M810 Age-related osteoporosis without current pathological fracture: Secondary | ICD-10-CM

## 2015-01-07 DIAGNOSIS — R262 Difficulty in walking, not elsewhere classified: Secondary | ICD-10-CM | POA: Diagnosis not present

## 2015-01-07 DIAGNOSIS — R2681 Unsteadiness on feet: Secondary | ICD-10-CM | POA: Diagnosis not present

## 2015-01-07 DIAGNOSIS — M62561 Muscle wasting and atrophy, not elsewhere classified, right lower leg: Secondary | ICD-10-CM | POA: Diagnosis not present

## 2015-01-07 NOTE — Patient Instructions (Signed)
Recheck blood pressure  Check in 2 weeks from now to see if I have notes from Dr. Vertell Limber and have reviewed- if not, may need to rerequest  Our scheduler Neoma Laming will call you about your scan of your legs tomorrow. I do not have a strong suspicion for blood clot in leg but we have to rule that out

## 2015-01-07 NOTE — Progress Notes (Signed)
Steven Reddish, MD  Subjective:  Steven [name] is a 81 y.o. year old very pleasant male patient who presents with:  Unilateral edema, R -Right leg has always been slightly larger than left but over last month, has become much more prominent-foot, lower leg are very edematous. No treatments have been tried. Stable since it became more swollen. Never had swelling like this before. Vein grafts for CABG were from right. Bilateral knee replacements  ROS- no chest pain or shortness of breat or dyspnea on exertion. No swelling of lymph nodes in groin  Past Medical History-  Patient Active Problem List   Diagnosis Date Noted  . CAD, ARTERY BYPASS GRAFT 05/21/2009    Priority: High  . Carotid artery stenosis 04/20/2008    Priority: High  . Diabetes mellitus type II, controlled 04/19/2007    Priority: High  . Anemia 09/03/2013    Priority: Medium  . Essential hypertension, benign 10/11/2012    Priority: Medium  . SVT (supraventricular tachycardia) 01/25/2011    Priority: Medium  . Hyperlipidemia 04/19/2007    Priority: Medium  . Foot drop, left 08/23/2012    Priority: Low  . Hypogonadism in male 02/04/2010    Priority: Low  . History of colonic polyps 09/07/2009    Priority: Low  . GERD 04/19/2007    Priority: Low  . Osteoporosis 01/07/2015   Medications- reviewed and updated Current Outpatient Prescriptions  Medication Sig Dispense Refill  . aspirin 81 MG tablet Take 81 mg by mouth daily.    Marland Kitchen atorvastatin (LIPITOR) 40 MG tablet TAKE 1 TABLET AT BEDTIME 90 tablet 1  . cycloSPORINE (RESTASIS) 0.05 % ophthalmic emulsion 1 drop 2 (two) times daily.    . diclofenac (VOLTAREN) 75 MG EC tablet TAKE 1 TABLET TWICE A DAY 180 tablet 3  . diltiazem (CARDIZEM LA) 180 MG 24 hr tablet TAKE 1 TABLET DAILY 90 tablet 2  . glucose blood (ONETOUCH VERIO) test strip Use to test blood sugars daily. 100 each 12  . glyBURIDE (DIABETA) 5 MG tablet TAKE 1 TABLET DAILY WITH BREAKFAST 90 tablet 2  .  ketoconazole (NIZORAL) 2 % shampoo APPLY 2 TIMES A WEEK 120 mL 3  . Lancets (ONETOUCH ULTRASOFT) lancets Use to test blood sugars daily. 100 each 12  . metFORMIN (GLUCOPHAGE) 1000 MG tablet TAKE 1 TABLET TWICE A DAY WITH MEALS 180 tablet 0  . metoprolol succinate (TOPROL-XL) 25 MG 24 hr tablet TAKE 1 TABLET DAILY 90 tablet 2  . omeprazole (PRILOSEC) 20 MG capsule TAKE 1 CAPSULE TWICE DAILY 180 capsule 1  . [DISCONTINUED] testosterone cypionate (DEPO-TESTOSTERONE) 200 MG/ML injection Inject 1 mL (200 mg total) into the muscle every 30 (thirty) days. 10 mL 0   No current facility-administered medications for this visit.    Objective: BP 146/68 mmHg  Pulse 86  Temp(Src) 98 F (36.7 C)  Wt 163 lb (73.936 kg) Gen: NAD, resting comfortably CV: RRR no murmurs rubs or gallops No JVD Lungs: CTAB no crackles, wheeze, rhonchi Abdomen: soft/nontender/nondistended/normal bowel sounds. No rebound or guarding.  Ext: 2+ pitting edema throughout right lower leg, 1+ on left (left similar to previous, R leg enlarged) Skin: warm, dry, no warmth or erythema on right leg, no signs of cellulitis Neuro: bilateral foot drop- wears braces for both legs  BP Readings from Last 3 Encounters:  01/07/15 146/68  03/25/14 130/66  12/25/13 138/70  controlled previously, possibly elevated due to anxiety over leg- we discussed follow up for DM, BP in coming  month or so  Assessment/Plan:  Unilateral edema, R Venous duplex to be completed tomorrow. Perhaps brace on right leg increases risk of DVT (had one on left previously though). No signs of pulmonary embolism fortunately. Consider NOAC if positive for DVT.  Could also be worsening of baseline venous insufficiency and R>L at baseline due to vein graft taken from that leg. Symptoms for 1 month lower my suspicion of DVT without worsening but possible.   Osteoporosis S: Patient reports new diagnosis of osteoporosis per Dr. Vertell Limber. No records available.  A/P: we  discussed obtaining records-he is going to make sure office sent then follow up with me in 2 weeks. Consider bisphosphonates    Emergent Return precautions advised.

## 2015-01-07 NOTE — Assessment & Plan Note (Signed)
S: Patient reports new diagnosis of osteoporosis per Dr. Vertell Limber. No records available.  A/P: we discussed obtaining records-he is going to make sure office sent then follow up with me in 2 weeks. Consider bisphosphonates

## 2015-01-08 ENCOUNTER — Ambulatory Visit (HOSPITAL_COMMUNITY)
Admission: RE | Admit: 2015-01-08 | Discharge: 2015-01-08 | Disposition: A | Discharge status: Home / Self Care | Payer: Medicare Other | Source: Ambulatory Visit | Attending: Cardiology | Admitting: Cardiology

## 2015-01-08 DIAGNOSIS — E1142 Type 2 diabetes mellitus with diabetic polyneuropathy: Secondary | ICD-10-CM | POA: Insufficient documentation

## 2015-01-08 DIAGNOSIS — E785 Hyperlipidemia, unspecified: Secondary | ICD-10-CM | POA: Diagnosis not present

## 2015-01-08 DIAGNOSIS — M7989 Other specified soft tissue disorders: Secondary | ICD-10-CM | POA: Insufficient documentation

## 2015-01-11 DIAGNOSIS — R262 Difficulty in walking, not elsewhere classified: Secondary | ICD-10-CM | POA: Diagnosis not present

## 2015-01-11 DIAGNOSIS — M6281 Muscle weakness (generalized): Secondary | ICD-10-CM | POA: Diagnosis not present

## 2015-01-11 DIAGNOSIS — R2681 Unsteadiness on feet: Secondary | ICD-10-CM | POA: Diagnosis not present

## 2015-01-11 DIAGNOSIS — M62561 Muscle wasting and atrophy, not elsewhere classified, right lower leg: Secondary | ICD-10-CM | POA: Diagnosis not present

## 2015-01-12 DIAGNOSIS — R2681 Unsteadiness on feet: Secondary | ICD-10-CM | POA: Diagnosis not present

## 2015-01-12 DIAGNOSIS — M62561 Muscle wasting and atrophy, not elsewhere classified, right lower leg: Secondary | ICD-10-CM | POA: Diagnosis not present

## 2015-01-12 DIAGNOSIS — R262 Difficulty in walking, not elsewhere classified: Secondary | ICD-10-CM | POA: Diagnosis not present

## 2015-01-12 DIAGNOSIS — M6281 Muscle weakness (generalized): Secondary | ICD-10-CM | POA: Diagnosis not present

## 2015-01-14 ENCOUNTER — Other Ambulatory Visit: Payer: Self-pay | Admitting: Internal Medicine

## 2015-01-14 ENCOUNTER — Other Ambulatory Visit: Payer: Self-pay | Admitting: Family Medicine

## 2015-01-14 DIAGNOSIS — R262 Difficulty in walking, not elsewhere classified: Secondary | ICD-10-CM | POA: Diagnosis not present

## 2015-01-14 DIAGNOSIS — M62561 Muscle wasting and atrophy, not elsewhere classified, right lower leg: Secondary | ICD-10-CM | POA: Diagnosis not present

## 2015-01-14 DIAGNOSIS — M6281 Muscle weakness (generalized): Secondary | ICD-10-CM | POA: Diagnosis not present

## 2015-01-14 DIAGNOSIS — R2681 Unsteadiness on feet: Secondary | ICD-10-CM | POA: Diagnosis not present

## 2015-01-18 DIAGNOSIS — M62561 Muscle wasting and atrophy, not elsewhere classified, right lower leg: Secondary | ICD-10-CM | POA: Diagnosis not present

## 2015-01-18 DIAGNOSIS — M6281 Muscle weakness (generalized): Secondary | ICD-10-CM | POA: Diagnosis not present

## 2015-01-18 DIAGNOSIS — R262 Difficulty in walking, not elsewhere classified: Secondary | ICD-10-CM | POA: Diagnosis not present

## 2015-01-18 DIAGNOSIS — R2681 Unsteadiness on feet: Secondary | ICD-10-CM | POA: Diagnosis not present

## 2015-01-19 DIAGNOSIS — R262 Difficulty in walking, not elsewhere classified: Secondary | ICD-10-CM | POA: Diagnosis not present

## 2015-01-19 DIAGNOSIS — R2681 Unsteadiness on feet: Secondary | ICD-10-CM | POA: Diagnosis not present

## 2015-01-19 DIAGNOSIS — M62561 Muscle wasting and atrophy, not elsewhere classified, right lower leg: Secondary | ICD-10-CM | POA: Diagnosis not present

## 2015-01-19 DIAGNOSIS — M6281 Muscle weakness (generalized): Secondary | ICD-10-CM | POA: Diagnosis not present

## 2015-01-21 DIAGNOSIS — M62561 Muscle wasting and atrophy, not elsewhere classified, right lower leg: Secondary | ICD-10-CM | POA: Diagnosis not present

## 2015-01-21 DIAGNOSIS — R262 Difficulty in walking, not elsewhere classified: Secondary | ICD-10-CM | POA: Diagnosis not present

## 2015-01-21 DIAGNOSIS — M6281 Muscle weakness (generalized): Secondary | ICD-10-CM | POA: Diagnosis not present

## 2015-01-21 DIAGNOSIS — R2681 Unsteadiness on feet: Secondary | ICD-10-CM | POA: Diagnosis not present

## 2015-01-25 DIAGNOSIS — R2681 Unsteadiness on feet: Secondary | ICD-10-CM | POA: Diagnosis not present

## 2015-01-25 DIAGNOSIS — M6281 Muscle weakness (generalized): Secondary | ICD-10-CM | POA: Diagnosis not present

## 2015-01-25 DIAGNOSIS — R262 Difficulty in walking, not elsewhere classified: Secondary | ICD-10-CM | POA: Diagnosis not present

## 2015-01-25 DIAGNOSIS — M62561 Muscle wasting and atrophy, not elsewhere classified, right lower leg: Secondary | ICD-10-CM | POA: Diagnosis not present

## 2015-01-26 DIAGNOSIS — R2681 Unsteadiness on feet: Secondary | ICD-10-CM | POA: Diagnosis not present

## 2015-01-26 DIAGNOSIS — M6281 Muscle weakness (generalized): Secondary | ICD-10-CM | POA: Diagnosis not present

## 2015-01-26 DIAGNOSIS — R262 Difficulty in walking, not elsewhere classified: Secondary | ICD-10-CM | POA: Diagnosis not present

## 2015-01-26 DIAGNOSIS — M62561 Muscle wasting and atrophy, not elsewhere classified, right lower leg: Secondary | ICD-10-CM | POA: Diagnosis not present

## 2015-01-28 DIAGNOSIS — R262 Difficulty in walking, not elsewhere classified: Secondary | ICD-10-CM | POA: Diagnosis not present

## 2015-01-28 DIAGNOSIS — M62561 Muscle wasting and atrophy, not elsewhere classified, right lower leg: Secondary | ICD-10-CM | POA: Diagnosis not present

## 2015-01-28 DIAGNOSIS — M6281 Muscle weakness (generalized): Secondary | ICD-10-CM | POA: Diagnosis not present

## 2015-01-28 DIAGNOSIS — R2681 Unsteadiness on feet: Secondary | ICD-10-CM | POA: Diagnosis not present

## 2015-02-01 DIAGNOSIS — R262 Difficulty in walking, not elsewhere classified: Secondary | ICD-10-CM | POA: Diagnosis not present

## 2015-02-01 DIAGNOSIS — R2681 Unsteadiness on feet: Secondary | ICD-10-CM | POA: Diagnosis not present

## 2015-02-01 DIAGNOSIS — M62561 Muscle wasting and atrophy, not elsewhere classified, right lower leg: Secondary | ICD-10-CM | POA: Diagnosis not present

## 2015-02-01 DIAGNOSIS — M6281 Muscle weakness (generalized): Secondary | ICD-10-CM | POA: Diagnosis not present

## 2015-02-02 DIAGNOSIS — M6281 Muscle weakness (generalized): Secondary | ICD-10-CM | POA: Diagnosis not present

## 2015-02-02 DIAGNOSIS — R262 Difficulty in walking, not elsewhere classified: Secondary | ICD-10-CM | POA: Diagnosis not present

## 2015-02-02 DIAGNOSIS — R2681 Unsteadiness on feet: Secondary | ICD-10-CM | POA: Diagnosis not present

## 2015-02-02 DIAGNOSIS — M62561 Muscle wasting and atrophy, not elsewhere classified, right lower leg: Secondary | ICD-10-CM | POA: Diagnosis not present

## 2015-02-04 DIAGNOSIS — R2681 Unsteadiness on feet: Secondary | ICD-10-CM | POA: Diagnosis not present

## 2015-02-04 DIAGNOSIS — M62561 Muscle wasting and atrophy, not elsewhere classified, right lower leg: Secondary | ICD-10-CM | POA: Diagnosis not present

## 2015-02-04 DIAGNOSIS — R262 Difficulty in walking, not elsewhere classified: Secondary | ICD-10-CM | POA: Diagnosis not present

## 2015-02-04 DIAGNOSIS — M6281 Muscle weakness (generalized): Secondary | ICD-10-CM | POA: Diagnosis not present

## 2015-02-09 DIAGNOSIS — R2681 Unsteadiness on feet: Secondary | ICD-10-CM | POA: Diagnosis not present

## 2015-02-09 DIAGNOSIS — M6281 Muscle weakness (generalized): Secondary | ICD-10-CM | POA: Diagnosis not present

## 2015-02-09 DIAGNOSIS — M62561 Muscle wasting and atrophy, not elsewhere classified, right lower leg: Secondary | ICD-10-CM | POA: Diagnosis not present

## 2015-02-09 DIAGNOSIS — R262 Difficulty in walking, not elsewhere classified: Secondary | ICD-10-CM | POA: Diagnosis not present

## 2015-02-10 DIAGNOSIS — R262 Difficulty in walking, not elsewhere classified: Secondary | ICD-10-CM | POA: Diagnosis not present

## 2015-02-10 DIAGNOSIS — M62561 Muscle wasting and atrophy, not elsewhere classified, right lower leg: Secondary | ICD-10-CM | POA: Diagnosis not present

## 2015-02-10 DIAGNOSIS — M6281 Muscle weakness (generalized): Secondary | ICD-10-CM | POA: Diagnosis not present

## 2015-02-10 DIAGNOSIS — R2681 Unsteadiness on feet: Secondary | ICD-10-CM | POA: Diagnosis not present

## 2015-02-11 DIAGNOSIS — R2681 Unsteadiness on feet: Secondary | ICD-10-CM | POA: Diagnosis not present

## 2015-02-11 DIAGNOSIS — M6281 Muscle weakness (generalized): Secondary | ICD-10-CM | POA: Diagnosis not present

## 2015-02-11 DIAGNOSIS — R262 Difficulty in walking, not elsewhere classified: Secondary | ICD-10-CM | POA: Diagnosis not present

## 2015-02-11 DIAGNOSIS — M62561 Muscle wasting and atrophy, not elsewhere classified, right lower leg: Secondary | ICD-10-CM | POA: Diagnosis not present

## 2015-03-28 ENCOUNTER — Other Ambulatory Visit: Payer: Self-pay | Admitting: Family Medicine

## 2015-03-30 DIAGNOSIS — R040 Epistaxis: Secondary | ICD-10-CM | POA: Diagnosis not present

## 2015-06-04 DIAGNOSIS — H5201 Hypermetropia, right eye: Secondary | ICD-10-CM | POA: Diagnosis not present

## 2015-06-04 DIAGNOSIS — E119 Type 2 diabetes mellitus without complications: Secondary | ICD-10-CM | POA: Diagnosis not present

## 2015-06-04 DIAGNOSIS — H52221 Regular astigmatism, right eye: Secondary | ICD-10-CM | POA: Diagnosis not present

## 2015-06-04 DIAGNOSIS — Z7984 Long term (current) use of oral hypoglycemic drugs: Secondary | ICD-10-CM | POA: Diagnosis not present

## 2015-06-04 DIAGNOSIS — Z9849 Cataract extraction status, unspecified eye: Secondary | ICD-10-CM | POA: Diagnosis not present

## 2015-06-04 DIAGNOSIS — Z961 Presence of intraocular lens: Secondary | ICD-10-CM | POA: Diagnosis not present

## 2015-06-04 DIAGNOSIS — H524 Presbyopia: Secondary | ICD-10-CM | POA: Diagnosis not present

## 2015-06-07 LAB — HM DIABETES EYE EXAM

## 2015-06-22 ENCOUNTER — Other Ambulatory Visit: Payer: Self-pay | Admitting: Family Medicine

## 2015-07-01 ENCOUNTER — Encounter: Payer: Self-pay | Admitting: Family Medicine

## 2015-08-31 ENCOUNTER — Ambulatory Visit (HOSPITAL_COMMUNITY)
Admission: RE | Admit: 2015-08-31 | Discharge: 2015-08-31 | Disposition: A | Discharge status: Home / Self Care | Payer: Medicare Other | Source: Ambulatory Visit | Attending: Internal Medicine | Admitting: Internal Medicine

## 2015-08-31 DIAGNOSIS — I6523 Occlusion and stenosis of bilateral carotid arteries: Secondary | ICD-10-CM | POA: Diagnosis not present

## 2015-08-31 DIAGNOSIS — E785 Hyperlipidemia, unspecified: Secondary | ICD-10-CM | POA: Insufficient documentation

## 2015-08-31 DIAGNOSIS — I6521 Occlusion and stenosis of right carotid artery: Secondary | ICD-10-CM | POA: Diagnosis not present

## 2015-08-31 DIAGNOSIS — E119 Type 2 diabetes mellitus without complications: Secondary | ICD-10-CM | POA: Diagnosis not present

## 2015-08-31 DIAGNOSIS — Z951 Presence of aortocoronary bypass graft: Secondary | ICD-10-CM | POA: Diagnosis not present

## 2015-08-31 DIAGNOSIS — Z87891 Personal history of nicotine dependence: Secondary | ICD-10-CM | POA: Diagnosis not present

## 2015-08-31 DIAGNOSIS — I2510 Atherosclerotic heart disease of native coronary artery without angina pectoris: Secondary | ICD-10-CM | POA: Diagnosis not present

## 2015-08-31 DIAGNOSIS — I10 Essential (primary) hypertension: Secondary | ICD-10-CM | POA: Insufficient documentation

## 2015-09-03 ENCOUNTER — Encounter: Payer: Self-pay | Admitting: Internal Medicine

## 2015-09-03 ENCOUNTER — Ambulatory Visit (INDEPENDENT_AMBULATORY_CARE_PROVIDER_SITE_OTHER): Payer: Medicare Other | Admitting: Internal Medicine

## 2015-09-03 VITALS — BP 144/78 | HR 66 | Ht 69.0 in | Wt 165.0 lb

## 2015-09-03 DIAGNOSIS — E785 Hyperlipidemia, unspecified: Secondary | ICD-10-CM

## 2015-09-03 DIAGNOSIS — I6521 Occlusion and stenosis of right carotid artery: Secondary | ICD-10-CM

## 2015-09-03 DIAGNOSIS — I471 Supraventricular tachycardia: Secondary | ICD-10-CM | POA: Diagnosis not present

## 2015-09-03 DIAGNOSIS — R011 Cardiac murmur, unspecified: Secondary | ICD-10-CM | POA: Diagnosis not present

## 2015-09-03 DIAGNOSIS — I10 Essential (primary) hypertension: Secondary | ICD-10-CM

## 2015-09-03 DIAGNOSIS — I25810 Atherosclerosis of coronary artery bypass graft(s) without angina pectoris: Secondary | ICD-10-CM | POA: Diagnosis not present

## 2015-09-03 DIAGNOSIS — I252 Old myocardial infarction: Secondary | ICD-10-CM | POA: Insufficient documentation

## 2015-09-03 DIAGNOSIS — R040 Epistaxis: Secondary | ICD-10-CM | POA: Diagnosis not present

## 2015-09-03 NOTE — Patient Instructions (Signed)
Your physician has requested that you have an echocardiogram.  Echocardiography is a painless test that uses sound waves to create images of your heart. It provides your doctor with information about the size and shape of your heart and how well your heart's chambers and valves are working. This procedure takes approximately one hour. There are no restrictions for this procedure.  No change in current medication  Your physician wants you to follow-up in 12 months with Dr Debara Pickett.  You will receive a reminder letter in the mail two months in advance. If you don't receive a letter, please call our office to schedule the follow-up appointment.  If you need a refill on your cardiac medications before your next appointment, please call your pharmacy.

## 2015-09-03 NOTE — Progress Notes (Signed)
OFFICE NOTE  Chief Complaint:  Routine follow-up  Primary Care Physician: [name], MD  HPI:  Steven [name] is a pleasant 82 year old male with history of coronary artery disease and coronary artery bypass grafting in 2004 after an inferior MI. He also has had a stent. He hasn't has diabetes, neuropathy, dyslipidemia, GERD and foot drop on the left for which he wears a brace. He has been followed by Dr. Wyatt Haste at with our cardiology. He saw her last in June of 2013 but reported since she is not seeing patients regularly in the office that he wished to see a different cardiologist. He has not had a stress test and a number of years. Recently he describes an episode where he was in his kitchen and felt a sharp pain in his left chest. This caused him to immediately turn his head violently to the right and he felt dizzy and then passed out. He does not remember falling but did catch himself and was confused when he was found fairly shortly afterwards on the floor. He does report he had an episode similar to this about one year ago where he fell backwards but was caught and did not totally pass out. He denies his heart racing or any periods of awareness of dizziness or blurred vision prior to the event.  The chest pain was described as sharp and sudden in the left chest like he was "punched" in the left chest. He has had no further chest pain or events.  He underwent nuclear stress testing recently which showed patent grafts and a small fixed inferior defect consistent with prior inferior MI. EF is preserved.  Steven [name] returns today for followup. He is feeling quite well. He has had no further syncopal events. He did have carotid Dopplers which showed moderate disease in the right and mild disease of the left. Is concerned about stroke.  I have assured him that that is unlikely this time and that we will continue to follow his carotid Dopplers closely. He has no chest pain complaints. Blood  pressure is well-controlled today.  09/03/2015  Steven [name] was seen today in the office in follow-up. Overall he seems to be doing pretty well. His carotid artery disease seems stable. He reports some shortness of breath on exertion. This is not necessarily worse than it did previously been. His EKG today however does show prominent Q waves in 3 and aVF. He does have a history of inferior MI however there are some new lateral T-wave inversions. For some reason the Q waves were previously present. His last echocardiogram did show inferior hypokinesis and 2015 however LVEF was preserved. Blood pressure is top normal today but he says well controlled at home. He is on Lipitor for dyslipidemia.  PMHx:  Past Medical History  Diagnosis Date  . Diabetes mellitus     type II  . GERD (gastroesophageal reflux disease)   . Hyperlipidemia   . History of myocardial infarction   . Myocardial infarction [facility]) 01-03-12    '04  . CAD (coronary artery disease) 2004    s/p inferior wall Mi 2004 with PTCA/Stent; s/p CABG 2004  . Arthritis 01-03-12    osteoarthritis-knees  . Neuropathy ([place]) 01-03-12    bil. feet  . MYOCARDIAL INFARCTION, HX OF 04/19/2007    Qualifier: Diagnosis of  By: Scherrie Gerlach    . Heart disease   . Degenerative arthritis   . Polyneuropathy in diabetes(357.2) 08/23/2012  . Foot  drop, bilateral 08/23/2012  . Abnormality of gait 08/23/2012  . Pneumonia     hx of  . History of kidney stones   . Anemia     hx of  . Radiculopathy of lumbar region 08/23/2011    Followed by Dr. Vertell Limber. S/p surgery. Resolved after surgery.      Past Surgical History  Procedure Laterality Date  . Coronary artery bypass graft  2004    LIMA to LAD; SVG to ramus intermedius; SVG to PDA/PLSA  . Tonsillectomy  as child  . Colonoscopy    . Laminectomy  09/08/2011    Procedure: LUMBAR LAMINECTOMY FOR TUMOR;  Surgeon: Erline Levine, MD;  Location: [place] [facility];  Service: Neurosurgery;  Laterality: Left;  Left Thoracic  twelve-Lumbar one transpedicular resection of epidural mass  . Cardiac catheterization  01-03-12    '04  . Total knee arthroplasty  01/15/2012    Procedure: TOTAL KNEE ARTHROPLASTY;  Surgeon: Gearlean Alf, MD;  Location: [facility];  Service: Orthopedics;  Laterality: Right;  . Back surgery  2013  . Total knee arthroplasty Left 09/16/2012    Procedure: LEFT TOTAL KNEE ARTHROPLASTY;  Surgeon: Gearlean Alf, MD;  Location: [facility] ORS;  Service: Orthopedics;  Laterality: Left;  . Cataract extraction Bilateral 2015    FAMHx:  Family History  Problem Relation Age of Onset  . Cirrhosis Mother     etiology unclear  . Hypertension Father   . Stroke Father   . Cancer Sister     breast  . Stroke Maternal Grandmother   . Heart disease Maternal Grandfather     MI  . Stroke Paternal Grandmother   . Stroke Paternal Grandfather   . Anesthesia problems Neg Hx   . Hypotension Neg Hx   . Malignant hyperthermia Neg Hx   . Pseudochol deficiency Neg Hx     SOCHx:   reports that he has never smoked. He has never used smokeless tobacco. He reports that he does not drink alcohol or use illicit drugs.  ALLERGIES:  Allergies  Allergen Reactions  . Altace [Ramipril] Other (See Comments)    hyperkalemia     ROS: Pertinent items noted in HPI and remainder of comprehensive ROS otherwise negative.  HOME MEDS: Current Outpatient Prescriptions  Medication Sig Dispense Refill  . aspirin 81 MG tablet Take 81 mg by mouth daily.    Marland Kitchen atorvastatin (LIPITOR) 40 MG tablet TAKE 1 TABLET AT BEDTIME 90 tablet 3  . cycloSPORINE (RESTASIS) 0.05 % ophthalmic emulsion 1 drop 2 (two) times daily.    . diclofenac (VOLTAREN) 75 MG EC tablet TAKE 1 TABLET TWICE A DAY 180 tablet 3  . diltiazem (CARDIZEM LA) 180 MG 24 hr tablet TAKE 1 TABLET DAILY 90 tablet 2  . glucose blood (ONETOUCH VERIO) test strip Use to test blood sugars daily. 100 each 12  . glyBURIDE (DIABETA) 5 MG tablet TAKE 1 TABLET DAILY WITH BREAKFAST 90  tablet 2  . ketoconazole (NIZORAL) 2 % shampoo APPLY 2 TIMES A WEEK 120 mL 3  . Lancets (ONETOUCH ULTRASOFT) lancets Use to test blood sugars daily. 100 each 12  . metFORMIN (GLUCOPHAGE) 1000 MG tablet TAKE 1 TABLET TWICE A DAY WITH MEALS (NEED APPOINTMENT) 180 tablet 3  . metoprolol succinate (TOPROL-XL) 25 MG 24 hr tablet TAKE 1 TABLET DAILY 90 tablet 2  . omeprazole (PRILOSEC) 20 MG capsule TAKE 1 CAPSULE TWICE A DAY 180 capsule 2  . [DISCONTINUED] testosterone cypionate (DEPO-TESTOSTERONE) 200 MG/ML injection Inject  1 mL (200 mg total) into the muscle every 30 (thirty) days. 10 mL 0   No current facility-administered medications for this visit.    LABS/IMAGING: No results found for this or any previous visit (from the past 48 hour(s)). No results found.  VITALS: BP 144/78 mmHg  Pulse 66  Ht 5\' 9"  (1.753 m)  Wt 165 lb (74.844 kg)  BMI 24.36 kg/m2  EXAM: Gen.: Awake, in no apparent distress HEENT: PERRLA, EOMI, no carotid bruit Lungs: Clear bilaterally Heart: Regular rate and rhythm normal [id number], 3/6 systolic murmur at the left lower sternal border Abdomen: Obese, protuberant, no masses Extremities: Trace bilateral edema with a left foot brace in place Pulses: 2+ bilateral Neuro: Grossly normal Psychiatric: Normal mood and affect  EKG: Normal sinus rhythm at 66, inferior Q waves suggestive of prior infarct, lateral T-wave inversions suggestive of possible ischemia  ASSESSMENT: 1. History of syncope, likely vasovagal 2. Coronary artery disease status post CABG in 2004 3. History of inferior MI 4. Diabetes type 2 5. Peripheral neuropathy 6. HTN - controlled 7. Dyslipidemia 8. Low risk nuclear stress test 12/11/2012 9. Murmur 10. DOE  PLAN: 1.   Steven [name] reports some shortness of breath with exertion. His last echo was 2 years ago. He did have inferior hypokinesis and a history of prior inferior MI. There are Q waves noted on his EKG today however they were not  previously seen and new lateral T-wave inversions. I like to recheck an echocardiogram to make sure there's been no worsening of his LV function. I'll contact him with those results otherwise no changes to his medications today. Follow-up with me annually or sooner if necessary.  Pixie Casino, MD, [facility] Attending Cardiologist [place] 09/03/2015, 6:25 PM

## 2015-09-06 ENCOUNTER — Telehealth: Payer: Self-pay

## 2015-09-06 DIAGNOSIS — I6521 Occlusion and stenosis of right carotid artery: Secondary | ICD-10-CM

## 2015-09-06 NOTE — Telephone Encounter (Signed)
Ordered carotid doppler to be done in 1 year.

## 2015-09-06 NOTE — Telephone Encounter (Signed)
-----   Message from Pixie Casino, MD sent at 09/02/2015 11:42 AM EDT ----- Stable carotid plaque. Repeat in 1 year.  Dr. Lemmie Evens

## 2015-09-24 ENCOUNTER — Ambulatory Visit (HOSPITAL_COMMUNITY): Payer: Medicare Other | Attending: Cardiology

## 2015-09-24 ENCOUNTER — Other Ambulatory Visit: Payer: Self-pay

## 2015-09-24 DIAGNOSIS — I340 Nonrheumatic mitral (valve) insufficiency: Secondary | ICD-10-CM | POA: Insufficient documentation

## 2015-09-24 DIAGNOSIS — I252 Old myocardial infarction: Secondary | ICD-10-CM | POA: Insufficient documentation

## 2015-09-24 DIAGNOSIS — I25810 Atherosclerosis of coronary artery bypass graft(s) without angina pectoris: Secondary | ICD-10-CM | POA: Diagnosis not present

## 2015-09-24 DIAGNOSIS — I77810 Thoracic aortic ectasia: Secondary | ICD-10-CM | POA: Insufficient documentation

## 2015-09-24 DIAGNOSIS — R011 Cardiac murmur, unspecified: Secondary | ICD-10-CM | POA: Insufficient documentation

## 2015-09-24 DIAGNOSIS — I071 Rheumatic tricuspid insufficiency: Secondary | ICD-10-CM | POA: Diagnosis not present

## 2015-09-24 DIAGNOSIS — I119 Hypertensive heart disease without heart failure: Secondary | ICD-10-CM | POA: Diagnosis not present

## 2015-09-24 DIAGNOSIS — I2510 Atherosclerotic heart disease of native coronary artery without angina pectoris: Secondary | ICD-10-CM | POA: Diagnosis not present

## 2015-09-24 DIAGNOSIS — E785 Hyperlipidemia, unspecified: Secondary | ICD-10-CM | POA: Insufficient documentation

## 2015-09-24 LAB — ECHOCARDIOGRAM COMPLETE
Ao-asc: 33 cm
E decel time: 254 ms
E/e' ratio: 6.04
FS: 42 % (ref 28–44)
IVS/LV PW RATIO, ED: 1.02
LA ID, A-P, ES: 51 mm
LA diam end sys: 51 mm
LA diam index: 2.66 cm/m2
LA vol A4C: 73.8 mL
LA vol index: 40.2 mL/m2
LA vol: 77.1 mL
LV E/e' medial: 6.04
LV E/e'average: 6.04
LV PW d: 11.9 mm — AB (ref 0.6–1.1)
LV e' LATERAL: 13.5 cm/s
LVOT SV: 72 mL
LVOT VTI: 18.9 cm
LVOT area: 3.8 cm2
LVOT diameter: 22 mm
LVOT peak vel: 94.2 cm/s
Lateral S' vel: 8.59 cm/s
MV Dec: 254
MV Peak grad: 3 mmHg
MV pk A vel: 123 m/s
MV pk E vel: 81.5 m/s
PV Reg grad dias: 6 mmHg
PV Reg vel dias: 124 cm/s
RV sys press: 39 mmHg
Reg peak vel: 278 cm/s
TDI e' lateral: 13.5
TDI e' medial: 4.79
TR max vel: 278 cm/s

## 2015-09-29 ENCOUNTER — Telehealth: Payer: Self-pay | Admitting: Internal Medicine

## 2015-09-29 NOTE — Telephone Encounter (Signed)
Forward /deferred Dr Debara Pickett

## 2015-09-29 NOTE — Telephone Encounter (Signed)
Information given to patient ,okay to exercise

## 2015-09-29 NOTE — Telephone Encounter (Signed)
He does have a murmur - mild mitral regurgitation. No problem to exercise with this.  Dr. Lemmie Evens

## 2015-09-29 NOTE — Telephone Encounter (Signed)
New message     Pt has a heart murmur.  How can he exercise?

## 2015-09-29 NOTE — Telephone Encounter (Signed)
Patient was called w/echo results on 09/28/15 and was not told he had a heart murmur - this was not noted on result note.

## 2015-12-30 DIAGNOSIS — L821 Other seborrheic keratosis: Secondary | ICD-10-CM | POA: Diagnosis not present

## 2015-12-30 DIAGNOSIS — L438 Other lichen planus: Secondary | ICD-10-CM | POA: Diagnosis not present

## 2016-01-06 ENCOUNTER — Ambulatory Visit (INDEPENDENT_AMBULATORY_CARE_PROVIDER_SITE_OTHER): Payer: Medicare Other

## 2016-01-06 DIAGNOSIS — Z23 Encounter for immunization: Secondary | ICD-10-CM | POA: Diagnosis not present

## 2016-02-01 ENCOUNTER — Other Ambulatory Visit: Payer: Self-pay

## 2016-02-01 MED ORDER — ATORVASTATIN CALCIUM 40 MG PO TABS: 40.0000 mg | ORAL_TABLET | Freq: Every day | ORAL | 3 refills | Status: DC

## 2016-02-01 MED ORDER — GLYBURIDE 5 MG PO TABS: 5.0000 mg | ORAL_TABLET | Freq: Every day | ORAL | 2 refills | Status: DC

## 2016-02-01 MED ORDER — METOPROLOL SUCCINATE ER 25 MG PO TB24: 25.0000 mg | ORAL_TABLET | Freq: Every day | ORAL | 2 refills | Status: DC

## 2016-02-01 MED ORDER — METFORMIN HCL 1000 MG PO TABS: ORAL_TABLET | ORAL | 3 refills | Status: DC

## 2016-02-17 ENCOUNTER — Other Ambulatory Visit: Payer: Self-pay

## 2016-02-17 ENCOUNTER — Telehealth: Payer: Self-pay | Admitting: Family Medicine

## 2016-02-17 MED ORDER — METFORMIN HCL 1000 MG PO TABS: ORAL_TABLET | ORAL | 0 refills | Status: DC

## 2016-02-17 MED ORDER — DILTIAZEM HCL ER COATED BEADS 180 MG PO TB24: 180.0000 mg | ORAL_TABLET | Freq: Every day | ORAL | 2 refills | Status: DC

## 2016-02-17 NOTE — Telephone Encounter (Signed)
Prescription sent to [facility] as requested. 14 tablets

## 2016-02-17 NOTE — Telephone Encounter (Signed)
[facility]FORMIN (GLUCOPHAGE) 1000 MG tablet  Until Nov 7.  Pt states he is out and his sugar has been going up  Can you send in 14 day supply to   [facility]?

## 2016-03-23 ENCOUNTER — Other Ambulatory Visit (INDEPENDENT_AMBULATORY_CARE_PROVIDER_SITE_OTHER): Payer: Medicare Other

## 2016-03-23 DIAGNOSIS — E119 Type 2 diabetes mellitus without complications: Secondary | ICD-10-CM | POA: Diagnosis not present

## 2016-03-23 DIAGNOSIS — E785 Hyperlipidemia, unspecified: Secondary | ICD-10-CM | POA: Diagnosis not present

## 2016-03-23 LAB — BASIC METABOLIC PANEL
BUN: 24 mg/dL — AB (ref 6–23)
CALCIUM: 9.4 mg/dL (ref 8.4–10.5)
CHLORIDE: 107 meq/L (ref 96–112)
CO2: 27 meq/L (ref 19–32)
CREATININE: 1.13 mg/dL (ref 0.40–1.50)
GFR: 65.87 mL/min (ref >60.00)
GLUCOSE: 131 mg/dL — AB (ref 70–99)
Potassium: 4.6 mEq/L (ref 3.5–5.1)
Sodium: 142 mEq/L (ref 135–145)

## 2016-03-23 LAB — CBC WITH DIFFERENTIAL/PLATELET
BASOS ABS: 0.1 10*3/uL (ref 0.0–0.1)
BASOS PCT: 0.9 % (ref 0.0–3.0)
EOS ABS: 0.2 10*3/uL (ref 0.0–0.7)
Eosinophils Relative: 3.7 % (ref 0.0–5.0)
HEMATOCRIT: 34.6 % — AB (ref 39.0–52.0)
Hemoglobin: 11.7 g/dL — ABNORMAL LOW (ref 13.0–17.0)
LYMPHS ABS: 2 10*3/uL (ref 0.7–4.0)
Lymphocytes Relative: 32 % (ref 12.0–46.0)
MCHC: 33.8 g/dL (ref 30.0–36.0)
MCV: 91.6 fl (ref 78.0–100.0)
MONO ABS: 0.5 10*3/uL (ref 0.1–1.0)
Monocytes Relative: 8.5 % (ref 3.0–12.0)
NEUTROS ABS: 3.5 10*3/uL (ref 1.4–7.7)
NEUTROS PCT: 54.9 % (ref 43.0–77.0)
PLATELETS: 212 10*3/uL (ref 150.0–400.0)
RBC: 3.78 Mil/uL — ABNORMAL LOW (ref 4.22–5.81)
RDW: 14.1 % (ref 11.5–15.5)
WBC: 6.3 10*3/uL (ref 4.0–10.5)

## 2016-03-23 LAB — HEPATIC FUNCTION PANEL
ALT: 13 U/L (ref 0–53)
AST: 15 U/L (ref 0–37)
Albumin: 3.9 g/dL (ref 3.5–5.2)
Alkaline Phosphatase: 61 U/L (ref 39–117)
BILIRUBIN DIRECT: 0.1 mg/dL (ref 0.0–0.3)
BILIRUBIN TOTAL: 0.7 mg/dL (ref 0.2–1.2)
Total Protein: 6.4 g/dL (ref 6.0–8.3)

## 2016-03-23 LAB — LIPID PANEL
CHOL/HDL RATIO: 4
CHOLESTEROL: 136 mg/dL (ref 0–200)
HDL: 38.5 mg/dL — AB (ref >39.00)
LDL CALC: 84 mg/dL (ref 0–99)
NonHDL: 97.96
TRIGLYCERIDES: 70 mg/dL (ref 0.0–149.0)
VLDL: 14 mg/dL (ref 0.0–40.0)

## 2016-03-23 LAB — POC URINALSYSI DIPSTICK (AUTOMATED)
BILIRUBIN UA: NEGATIVE
Blood, UA: NEGATIVE
GLUCOSE UA: NEGATIVE
Ketones, UA: NEGATIVE
Leukocytes, UA: NEGATIVE
NITRITE UA: NEGATIVE
SPEC GRAV UA: 1.02
UROBILINOGEN UA: 0.2
pH, UA: 5.5

## 2016-03-23 LAB — HEMOGLOBIN A1C: Hgb A1c MFr Bld: 6.5 % (ref 4.6–6.5)

## 2016-03-24 ENCOUNTER — Other Ambulatory Visit: Payer: Self-pay

## 2016-03-28 ENCOUNTER — Ambulatory Visit (INDEPENDENT_AMBULATORY_CARE_PROVIDER_SITE_OTHER): Payer: Medicare Other | Admitting: Family Medicine

## 2016-03-28 ENCOUNTER — Encounter: Payer: Self-pay | Admitting: Family Medicine

## 2016-03-28 VITALS — BP 134/60 | HR 78 | Temp 98.2°F | Ht 64.25 in | Wt 169.0 lb

## 2016-03-28 DIAGNOSIS — E785 Hyperlipidemia, unspecified: Secondary | ICD-10-CM

## 2016-03-28 DIAGNOSIS — E119 Type 2 diabetes mellitus without complications: Secondary | ICD-10-CM | POA: Diagnosis not present

## 2016-03-28 DIAGNOSIS — I6521 Occlusion and stenosis of right carotid artery: Secondary | ICD-10-CM

## 2016-03-28 DIAGNOSIS — I10 Essential (primary) hypertension: Secondary | ICD-10-CM | POA: Diagnosis not present

## 2016-03-28 DIAGNOSIS — I25810 Atherosclerosis of coronary artery bypass graft(s) without angina pectoris: Secondary | ICD-10-CM

## 2016-03-28 MED ORDER — ATORVASTATIN CALCIUM 80 MG PO TABS: 80.0000 mg | ORAL_TABLET | Freq: Every day | ORAL | 3 refills | Status: DC

## 2016-03-28 NOTE — Assessment & Plan Note (Signed)
S: mild poorly controlled on atorvastatin 40mg  considering CAD. No myalgias.  Lab Results  Component Value Date   CHOL 136 03/23/2016   HDL 38.50 (L) 03/23/2016   LDLCALC 84 03/23/2016   TRIG 70.0 03/23/2016   CHOLHDL 4 03/23/2016   A/P: increase to 80mg  atorvastatin if tolerates (some higher risk of myopathy with age as well as diltiazem). Follow up 6 months

## 2016-03-28 NOTE — Patient Instructions (Addendum)
I would like for you to sign up for an annual wellness visit with our nurse, Manuela Schwartz, at next available appointment who specializes in the annual wellness exam. This is a free benefit under medicare that may help [place] find additional ways to help you. Some highlights are reviewing medications, lifestyle, and doing a dementia screen.   Stop voltaren/diclofenac  Increase atorvastatin from 40mg  to 80mg . If you get new muscle aches- let me know and we will go back to the 40mg  dose.   No other changes  Roselyn Reef will sign you up for Mychart

## 2016-03-28 NOTE — Assessment & Plan Note (Signed)
S: well controlled On glyburide 5mg  in AM, metformin 1000mg  BID CBGs- around 150 average in mornings. Denies lows. Ran out of metformin for a bit and had #s well over 200s though Lab Results  Component Value Date   HGBA1C 6.5 03/23/2016   HGBA1C 7.2 (H) 03/25/2014   HGBA1C 7.0 (H) 09/03/2013   A/P: continue current medications.

## 2016-03-28 NOTE — Assessment & Plan Note (Signed)
S: controlled on  Metoprolol 25 mg XL, diltiazem 180mg  XL BP Readings from Last 3 Encounters:  03/28/16 134/60  09/03/15 (!) 144/78  01/07/15 (!) 146/68  A/P:Continue current meds:  Improved control this visit systolic. Will target <140 or 145

## 2016-03-28 NOTE — Assessment & Plan Note (Signed)
S: stable carotid dopplers 08/31/15 under Dr. Debara Pickett  A/P: we discussed pushing LDL to less than 70 to help with worsening. BP much improved control

## 2016-03-28 NOTE — Progress Notes (Signed)
Pre visit review using our clinic review tool, if applicable. No additional management support is needed unless otherwise documented below in the visit note. 

## 2016-03-28 NOTE — Progress Notes (Signed)
Subjective:  Steven [name] is a 82 y.o. year old very pleasant male patient who presents for/with See problem oriented charting ROS- no chest pain. Denies dizziness. No edema if wears compression stockings. Intermittent eye infections- advised to be seen with infections .   Past Medical History-  Patient Active Problem List   Diagnosis Date Noted  . Osteoporosis 01/07/2015    Priority: High  . CAD, ARTERY BYPASS GRAFT 05/21/2009    Priority: High  . Carotid artery stenosis 04/20/2008    Priority: High  . Diabetes mellitus type II, controlled ([place]) 04/19/2007    Priority: High  . Anemia 09/03/2013    Priority: Medium  . Essential hypertension, benign 10/11/2012    Priority: Medium  . SVT (supraventricular tachycardia) ([place]) 01/25/2011    Priority: Medium  . Hyperlipidemia 04/19/2007    Priority: Medium  . History of acute inferior wall MI 09/03/2015    Priority: Low  . Foot drop, left 08/23/2012    Priority: Low  . Hypogonadism in male 02/04/2010    Priority: Low  . History of colonic polyps 09/07/2009    Priority: Low  . GERD 04/19/2007    Priority: Low    Medications- reviewed and updated Current Outpatient Prescriptions  Medication Sig Dispense Refill  . aspirin 81 MG tablet Take 81 mg by mouth daily.    . cycloSPORINE (RESTASIS) 0.05 % ophthalmic emulsion 1 drop 2 (two) times daily.    Marland Kitchen diltiazem (CARDIZEM LA) 180 MG 24 hr tablet Take 1 tablet (180 mg total) by mouth daily. 90 tablet 2  . glyBURIDE (DIABETA) 5 MG tablet Take 1 tablet (5 mg total) by mouth daily with breakfast. 90 tablet 2  . ketoconazole (NIZORAL) 2 % shampoo APPLY 2 TIMES A WEEK 120 mL 3  . metFORMIN (GLUCOPHAGE) 1000 MG tablet TAKE 1 TABLET TWICE A DAY WITH MEALS 14 tablet 0  . metoprolol succinate (TOPROL-XL) 25 MG 24 hr tablet Take 1 tablet (25 mg total) by mouth daily. 90 tablet 2  . omeprazole (PRILOSEC) 20 MG capsule TAKE 1 CAPSULE TWICE A DAY 180 capsule 2  . atorvastatin (LIPITOR) 80 MG  tablet Take 1 tablet (80 mg total) by mouth daily. 90 tablet 3   No current facility-administered medications for this visit.     Objective: BP 134/60 (BP Location: Left Arm, Patient Position: Sitting, Cuff Size: Large)   Pulse 78   Temp 98.2 F (36.8 C) (Oral)   Ht 5' 4.25" (1.632 m)   Wt 169 lb (76.7 kg)   SpO2 97%   BMI 28.78 kg/m  Gen: NAD, resting comfortably CV: RRR no murmurs rubs or gallops Lungs: CTAB no crackles, wheeze, rhonchi Abdomen: soft/nontender/nondistended/normal bowel sounds. No rebound or guarding. Reasonable weight Ext: trace edema Skin: warm, dry  Assessment/Plan:  Diabetes mellitus type II, controlled S: well controlled On glyburide 5mg  in AM, metformin 1000mg  BID CBGs- around 150 average in mornings. Denies lows. Ran out of metformin for a bit and had #s well over 200s though Lab Results  Component Value Date   HGBA1C 6.5 03/23/2016   HGBA1C 7.2 (H) 03/25/2014   HGBA1C 7.0 (H) 09/03/2013   A/P: continue current medications.   Carotid artery stenosis S: stable carotid dopplers 08/31/15 under Dr. Debara Pickett  A/P: we discussed pushing LDL to less than 70 to help with worsening. BP much improved control  Essential hypertension, benign S: controlled on  Metoprolol 25 mg XL, diltiazem 180mg  XL BP Readings from Last 3 Encounters:  03/28/16 134/60  09/03/15 (!) 144/78  01/07/15 (!) 146/68  A/P:Continue current meds:  Improved control this visit systolic. Will target <140 or 145  CAD, ARTERY BYPASS GRAFT S: CABG 2004. Follows with Dr. Debara Pickett. Compliant with aspirin and statin.  LDL 84 on atorvastatin 40mg - we discussed option of pushing this to <70 with his CAD history.  A/P: increase atorvastatin. Continue aspirin. Reviewed risks of diclofenac for CAD, kidneys, as well as bleeding risk - he is to stop this if still taking  Hyperlipidemia S: mild poorly controlled on atorvastatin 40mg  considering CAD. No myalgias.  Lab Results  Component Value Date    CHOL 136 03/23/2016   HDL 38.50 (L) 03/23/2016   LDLCALC 84 03/23/2016   TRIG 70.0 03/23/2016   CHOLHDL 4 03/23/2016   A/P: increase to 80mg  atorvastatin if tolerates (some higher risk of myopathy with age as well as diltiazem). Follow up 6 months  Also noted stable anemia- continue to monitor at least yearly. History of colon polyp 2011 adenoma with no recall due to age.   6  months  Meds ordered this encounter  Medications  . atorvastatin (LIPITOR) 80 MG tablet    Sig: Take 1 tablet (80 mg total) by mouth daily.    Dispense:  90 tablet    Refill:  3    Return precautions advised.  [name], MD

## 2016-03-28 NOTE — Assessment & Plan Note (Signed)
S: CABG 2004. Follows with Dr. Debara Pickett. Compliant with aspirin and statin.  LDL 84 on atorvastatin 40mg - we discussed option of pushing this to <70 with his CAD history.  A/P: increase atorvastatin. Continue aspirin. Reviewed risks of diclofenac for CAD, kidneys, as well as bleeding risk - he is to stop this if still taking

## 2016-05-02 ENCOUNTER — Ambulatory Visit (INDEPENDENT_AMBULATORY_CARE_PROVIDER_SITE_OTHER): Payer: Medicare Other

## 2016-05-02 VITALS — BP 130/70 | HR 73 | Ht 60.0 in | Wt 167.3 lb

## 2016-05-02 DIAGNOSIS — Z0000 Encounter for general adult medical examination without abnormal findings: Secondary | ICD-10-CM

## 2016-05-02 NOTE — Progress Notes (Signed)
I have reviewed and agree with note, evaluation, plan. I asked Steven [name] to not delay foot exams for a year when patient declines as this will be less likely to prompt follow up for this on subsequent visits. Appreciate emotional support patient was given.   [name], MD

## 2016-05-02 NOTE — Progress Notes (Signed)
Subjective:   Steven [name] is a 83 y.o. male who presents for Medicare Annual/Subsequent preventive examination.  The Patient was informed that the wellness visit is to identify future health risk and educate and initiate measures that can reduce risk for increased disease through the lifespan.    NO ROS; Medicare Wellness Visit  Describes health as good, fair or great? Feels like he is getting around fair. 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   The patient came in clearly focused and somewhat distraught over the deteriorating mental status of his spouse, his own disability and need for a walker. Has focused on issues dealing with his estate.  Diet; wife used to cook  Now memory issues -  Dtr moved in with them to assist with every day care But states they need more help and he is hiring someone to complete the laundry  Son drives him most of the time.  2 level home; bedrooms are upstairs Sooner or later will buy a chair lift  No significant weight loss noted;    Fall risk: walks with walker but somewhat impulsive; Is mobile; denies falls; c/o of back pain when getting up.  Mobility of Functional changes this year? States he started using the walker last year;  Safety at home and  Community reviewed; Is prepared to move to a retirement community if he or his spouses health decline and his dtr can no longer care for them    Depression Screen Mr. Carraher states  being on a walker and his wife having short term memory and makes him angry; denies depression  Is doing income tax and wife did this.  She apparently was an Optometrist.  Is working on [employer] and decisions affecting his estate;  Did admit to grief over his wife's loss of memory and life changes and will cry for very brief periods. Also challenged with lock of understanding of memory issues; his wife  will not go to the the doctor; etc. Educated regarding memory issues. Recommended outreach to the [facility] or other. Had multiple questions regarding long term care; possibly having to move etc; when confronted with feeling overwhelmed; he denied being overwhelmed;    Activities of Daily Living - See functional screen   Cognitive testing; still managing finances at this time Still managing at this time but does have 24/7 help in the home; denies memory issues except for occasional recall Ad8 score; 0 or less than 2 per his admisison   Advanced Directives reviewed for completion; in process of writing up; having difficulty making decisions; His goal is to sit with someone he trust and develop a plan;   Preventives screens reviewed Colonoscopy aged out  PSA deferred to medical   Immunization History  Administered Date(s) Administered  . Influenza Split 01/25/2011, 01/16/2012  . Influenza Whole 04/26/2007, 01/27/2008, 02/09/2009, 01/18/2010  . Influenza, High Dose Seasonal PF 01/06/2016  . Influenza,inj,Quad PF,36+ Mos 12/30/2012, 01/15/2014, 12/25/2014  . Pneumococcal Conjugate-13 02/03/2013  . Pneumococcal Polysaccharide-23 02/25/1998, 01/25/2011  . Td 02/25/1998, 12/22/2008  . Zoster 01/27/2008   Required Immunizations needed  today  Screening test up to date or reviewed for plan of completion Offered to do his foot exam today;  States he has braces on and it is difficult to get them off and on and would prefer not to do today.   Reviewed Diabetic foot care.  [facility] and nail care  States he looks at his foot every night    Cardiac Risk Factors include: advanced age (>26men, >73  women);dyslipidemia;hypertension;sedentary lifestyle;family history of premature cardiovascular disease;diabetes mellitus     Objective:    Vitals: BP 130/70   Pulse 73   Ht 5' (1.524 m)   Wt 167 lb 5 oz (75.9 kg)   SpO2 98%   BMI 32.68 kg/m   Body mass index is 32.68 kg/m.  Tobacco History  Smoking Status  . Never Smoker  Smokeless Tobacco  . Never Used     Counseling given: Yes   Past Medical History:  Diagnosis Date  . Abnormality of gait 08/23/2012  . Anemia    hx of  . Arthritis 01-03-12   osteoarthritis-knees  . CAD (coronary artery disease) 2004   s/p inferior wall Mi 2004 with PTCA/Stent; s/p CABG 2004  . Degenerative arthritis   . Diabetes mellitus    type II  . Foot drop, bilateral 08/23/2012  . GERD (gastroesophageal reflux disease)   . Heart disease   . History of kidney stones   . History of myocardial infarction   . Hyperlipidemia   . Myocardial infarction 01-03-12   '04  . MYOCARDIAL INFARCTION, HX OF 04/19/2007   Qualifier: Diagnosis of  By: Scherrie Gerlach    . Neuropathy ([place]) 01-03-12   bil. feet  . Pneumonia    hx of  . Polyneuropathy in diabetes(357.2) 08/23/2012  . Radiculopathy of lumbar region 08/23/2011   Followed by Dr. Vertell Limber. S/p surgery. Resolved after surgery.     Past Surgical History:  Procedure Laterality Date  . BACK SURGERY  2013  . CARDIAC CATHETERIZATION  01-03-12   '04  . CATARACT EXTRACTION Bilateral 2015  . COLONOSCOPY    . CORONARY ARTERY BYPASS GRAFT  2004   LIMA to LAD; SVG to ramus intermedius; SVG to PDA/PLSA  . LAMINECTOMY  09/08/2011   Procedure: LUMBAR LAMINECTOMY FOR TUMOR;  Surgeon: Erline Levine, MD;  Location: [facility];  Service: Neurosurgery;  Laterality: Left;  Left Thoracic twelve-Lumbar one transpedicular resection of epidural mass  . TONSILLECTOMY  as child  . TOTAL KNEE ARTHROPLASTY  01/15/2012   Procedure: TOTAL KNEE ARTHROPLASTY;  Surgeon: Gearlean Alf, MD;  Location: [facility];  Service: Orthopedics;   Laterality: Right;  . TOTAL KNEE ARTHROPLASTY Left 09/16/2012   Procedure: LEFT TOTAL KNEE ARTHROPLASTY;  Surgeon: Gearlean Alf, MD;  Location: [facility];  Service: Orthopedics;  Laterality: Left;   Family History  Problem Relation Age of Onset  . Cirrhosis Mother     etiology unclear  . Hypertension Father   . Stroke Father   . Cancer Sister     breast  . Stroke Maternal Grandmother   . Heart disease Maternal Grandfather     MI  . Stroke Paternal Grandmother   . Stroke Paternal Grandfather   . Anesthesia problems Neg Hx   . Hypotension Neg Hx   . Malignant hyperthermia Neg Hx   . Pseudochol deficiency Neg Hx    History  Sexual Activity  . Sexual activity: Not Currently    Outpatient Encounter Prescriptions as  of 05/02/2016  Medication Sig  . aspirin 81 MG tablet Take 81 mg by mouth daily.  Marland Kitchen atorvastatin (LIPITOR) 80 MG tablet Take 1 tablet (80 mg total) by mouth daily.  . cycloSPORINE (RESTASIS) 0.05 % ophthalmic emulsion 1 drop 2 (two) times daily.  Marland Kitchen diltiazem (CARDIZEM LA) 180 MG 24 hr tablet Take 1 tablet (180 mg total) by mouth daily.  Marland Kitchen glyBURIDE (DIABETA) 5 MG tablet Take 1 tablet (5 mg total) by mouth daily with breakfast.  . ketoconazole (NIZORAL) 2 % shampoo APPLY 2 TIMES A WEEK  . metFORMIN (GLUCOPHAGE) 1000 MG tablet TAKE 1 TABLET TWICE A DAY WITH MEALS  . metoprolol succinate (TOPROL-XL) 25 MG 24 hr tablet Take 1 tablet (25 mg total) by mouth daily.  Marland Kitchen omeprazole (PRILOSEC) 20 MG capsule TAKE 1 CAPSULE TWICE A DAY   No facility-administered encounter medications on file as of 05/02/2016.     Activities of Daily Living In your present state of health, do you have any difficulty performing the following activities: 05/02/2016  Hearing? N  Vision? N  Difficulty concentrating or making decisions? N  Walking or climbing stairs? N  Dressing or bathing? N  Doing errands, shopping? N  Preparing Food and eating ? Y  Using the Toilet? N  In the past six months,  have you accidently leaked urine? N  Do you have problems with loss of bowel control? N  Managing your Medications? N  Managing your Finances? N  Housekeeping or managing your Housekeeping? N  Some recent data might be hidden    Patient Care Team: Marin Olp, MD as PCP - General (Family Medicine) Erline Levine, MD as Consulting Physician (Neurosurgery)   Assessment:     Exercise Activities and Dietary recommendations Current Exercise Habits: Home exercise routine  Goals    . patient          Alzheimer's Association / Family information and training  [email or web address]-whitaker.org/.asp Loss adjuster, chartered for information  Driving resource center; Can send out driving contract   SAFETY; TeleconferenceOnDemand.fr.asp#howdementiaaffects  [facility]  (please mail donations to this address) [address]  [place], [place] [address] P: [phone] F: [address], [place] [address] P: [phone] F: [address], [id number] [place], [place] [address] P: [phone] F: [phone]  Families to call the 800 number to get information on all the resources in the area 24 hour 1 [phone]  Can provide resources; Questions about Dementia; Support groups; Give the caregiver information regarding respite ([facility]) Call the [place] on Aging;as they oversee who gets the grant fund for certain areas (123XX123) Also have Tools to help navigate the needs of the patient  Opportunities for free educational programs online 100 support groups [place];  Just starting Early stage program and care partners for the patient and the family 12 weeks of a support group;  12 weeks of social engagement component End with Educational wrap up Cal the 800 number given for more information  All's connected  Navigator for  those more comfortable with navigating the internet and allows one to develop a plan based on resources;  Website and review the educational calander for programs that are available.  They do care consultations with appointments  Packets for physician offices/  Packets physicians can give to newly dx patients Early stage flyers  Also have examples of Safety services and jewelry             .  Patient center           Will get your estate in order Will call a friend in the insurance; and go over where you are and what you need to do with Frederico Hamman Need update will HCPOA Determine where to put in investments; trust         Fall Risk Fall Risk  03/28/2016 03/24/2016 01/07/2015 02/03/2013  Falls in the past year? No No No Yes  Number falls in past yr: - - - 1  Injury with Fall? - - - Yes   Depression Screen PHQ 2/9 Scores 05/02/2016 03/28/2016 01/07/2015 02/03/2013  PHQ - 2 Score 0 0 0 0   Grieving over life changes and educated on getting counseling to resolve but states he will let us  Know if he needs help  Cognitive Function MMSE - Mini Mental State Exam 05/02/2016  Not completed: (No Data)    States he may forget a name but remember it later       Immunization History  Administered Date(s) Administered  . Influenza Split 01/25/2011, 01/16/2012  . Influenza Whole 04/26/2007, 01/27/2008, 02/09/2009, 01/18/2010  . Influenza, High Dose Seasonal PF 01/06/2016  . Influenza,inj,Quad PF,36+ Mos 12/30/2012, 01/15/2014, 12/25/2014  . Pneumococcal Conjugate-13 02/03/2013  . Pneumococcal Polysaccharide-23 02/25/1998, 01/25/2011  . Td 02/25/1998, 12/22/2008  . Zoster 01/27/2008   Screening Tests Health Maintenance  Topic Date Due  . FOOT EXAM  04/30/2017 (Originally 09/04/2014)  . OPHTHALMOLOGY EXAM  06/06/2016  . HEMOGLOBIN A1C  09/21/2016  . TETANUS/TDAP  12/23/2018  . INFLUENZA VACCINE  Completed  . ZOSTAVAX  Completed  . PNA vac Low Risk Adult  Completed       Plan:     Developed plan and offered help and resources for care of spouse and self; denies depression but did educated on depression and grief and how grief can be paralyzing if not embraced.  The patient agreed to take action to simplify your life.   During the course of the visit the patient was educated and counseled about the following appropriate screening and preventive services:   Vaccines to include Pneumoccal, Influenza, Hepatitis B, Td, Zostavax, HCV  Electrocardiogram  Cardiovascular Disease  Colorectal cancer screening aged out   Diabetes screening/ A1c in good control   Prostate Cancer Screening deferred   Glaucoma screening denies issues   Nutrition counseling   Smoking cessation counseling na  Patient Instructions (the written plan) was given to the patient.    Wynetta Fines, RN  05/02/2016

## 2016-05-02 NOTE — Patient Instructions (Addendum)
Steven [name] , Thank you for taking time to come for your Medicare Wellness Visit. I appreciate your ongoing commitment to your health goals. Please review the following plan we discussed and let me know if I can assist you in the future.   Can look up [employer] online as she has worked with memory loss patients for many years   To note; information from the Alzheimer's Asso may be helpful in dealing with memory issues in family members  Please let Dr. Yong Channel know if he can assist you further  These are the goals we discussed: Goals    . patient          Alzheimer's Association / Family information and training  [email or web address]-[email or web address] Loss adjuster, chartered for information  Driving resource center; Can send out driving contract   SAFETY; [email or web address]  [employer]  (please mail donations to this address) [address]  [place], [place] [address] P: [phone] F: [place], [place] [address] P: [phone] F: [address], [id number] [place], [place] [address] P: [phone] F: [phone]  Families to call the 800 number to get information on all the resources in the area 24 hour 1 7068311352  Can provide resources; Questions about Dementia; Support groups; Give the caregiver information regarding respite ([facility]) Call the [place] on Aging;as they oversee who gets the grant fund for certain areas (123XX123) Also have Tools to help navigate the needs of the patient  Opportunities for free educational programs online 100 support groups [place];  Just starting Early stage program and care partners for the patient and the family 12 weeks of a support group;  12 weeks of social engagement component End with Educational wrap up Cal the 800 number given for more  information  All's connected  Navigator for those more comfortable with navigating the internet and allows one to develop a plan based on resources;  Website and review the educational calander for programs that are available.  They do care consultations with appointments  Packets for physician offices/  Packets physicians can give to newly dx patients Early stage flyers  Also have examples of Safety services and jewelry                This is a list of the screening recommended for you and due dates:  Health Maintenance  Topic Date Due  . Complete foot exam   09/04/2014  . Eye exam for diabetics  06/06/2016  . Hemoglobin A1C  09/21/2016  . Tetanus Vaccine  12/23/2018  . Flu Shot  Completed  . Shingles Vaccine  Completed  . Pneumonia vaccines  Completed        Diabetes and Foot Care Diabetes may cause you to have problems because of poor blood supply (circulation) to your feet and legs. This may cause the skin on your feet to become thinner, break easier, and heal more slowly. Your skin may become dry, and the skin may peel and crack. You may also have nerve damage in your legs and feet causing decreased feeling in them. You may not notice minor injuries to your feet that could lead to infections or more serious problems. Taking care of your feet is one of the most important things you can do for yourself. Follow these instructions at home:  Wear shoes at all times, even in the house. Do not go barefoot. Bare feet are easily injured.  Check your feet daily  for blisters, cuts, and redness. If you cannot see the bottom of your feet, use a mirror or ask someone for help.  Wash your feet with warm water (do not use hot water) and mild soap. Then pat your feet and the areas between your toes until they are completely dry. Do not soak your feet as this can dry your skin.  Apply a moisturizing lotion or petroleum jelly (that does not contain alcohol and is unscented) to  the skin on your feet and to dry, brittle toenails. Do not apply lotion between your toes.  Trim your toenails straight across. Do not dig under them or around the cuticle. File the edges of your nails with an emery board or nail file.  Do not cut corns or calluses or try to remove them with medicine.  Wear clean socks or stockings every day. Make sure they are not too tight. Do not wear knee-high stockings since they may decrease blood flow to your legs.  Wear shoes that fit properly and have enough cushioning. To break in new shoes, wear them for just a few hours a day. This prevents you from injuring your feet. Always look in your shoes before you put them on to be sure there are no objects inside.  Do not cross your legs. This may decrease the blood flow to your feet.  If you find a minor scrape, cut, or break in the skin on your feet, keep it and the skin around it clean and dry. These areas may be cleansed with mild soap and water. Do not cleanse the area with peroxide, alcohol, or iodine.  When you remove an adhesive bandage, be sure not to damage the skin around it.  If you have a wound, look at it several times a day to make sure it is healing.  Do not use heating pads or hot water bottles. They may burn your skin. If you have lost feeling in your feet or legs, you may not know it is happening until it is too late.  Make sure your health care provider performs a complete foot exam at least annually or more often if you have foot problems. Report any cuts, sores, or bruises to your health care provider immediately. Contact a health care provider if:  You have an injury that is not healing.  You have cuts or breaks in the skin.  You have an ingrown nail.  You notice redness on your legs or feet.  You feel burning or tingling in your legs or feet.  You have pain or cramps in your legs and feet.  Your legs or feet are numb.  Your feet always feel cold. Get help right away  if:  There is increasing redness, swelling, or pain in or around a wound.  There is a red line that goes up your leg.  Pus is coming from a wound.  You develop a fever or as directed by your health care provider.  You notice a bad smell coming from an ulcer or wound. This information is not intended to replace advice given to you by your health care provider. Make sure you discuss any questions you have with your health care provider. Document Released: 03/31/2000 Document Revised: 09/09/2015 Document Reviewed: 09/10/2012 Elsevier Interactive Patient Education  2017 [place] Prevention in the Home Introduction Falls can cause injuries. They can happen to people of all ages. There are many things you can do to make your home safe  and to help prevent falls. What can I do on the outside of my home?  Regularly fix the edges of walkways and driveways and fix any cracks.  Remove anything that might make you trip as you walk through a door, such as a raised step or threshold.  Trim any bushes or trees on the path to your home.  Use bright outdoor lighting.  Clear any walking paths of anything that might make someone trip, such as rocks or tools.  Regularly check to see if handrails are loose or broken. Make sure that both sides of any steps have handrails.  Any raised decks and porches should have guardrails on the edges.  Have any leaves, snow, or ice cleared regularly.  Use sand or salt on walking paths during winter.  Clean up any spills in your garage right away. This includes oil or grease spills. What can I do in the bathroom?  Use night lights.  Install grab bars by the toilet and in the tub and shower. Do not use towel bars as grab bars.  Use non-skid mats or decals in the tub or shower.  If you need to sit down in the shower, use a plastic, non-slip stool.  Keep the floor dry. Clean up any water that spills on the floor as soon as it happens.  Remove  soap buildup in the tub or shower regularly.  Attach bath mats securely with double-sided non-slip rug tape.  Do not have throw rugs and other things on the floor that can make you trip. What can I do in the bedroom?  Use night lights.  Make sure that you have a light by your bed that is easy to reach.  Do not use any sheets or blankets that are too big for your bed. They should not hang down onto the floor.  Have a firm chair that has side arms. You can use this for support while you get dressed.  Do not have throw rugs and other things on the floor that can make you trip. What can I do in the kitchen?  Clean up any spills right away.  Avoid walking on wet floors.  Keep items that you use a lot in easy-to-reach places.  If you need to reach something above you, use a strong step stool that has a grab bar.  Keep electrical cords out of the way.  Do not use floor polish or wax that makes floors slippery. If you must use wax, use non-skid floor wax.  Do not have throw rugs and other things on the floor that can make you trip. What can I do with my stairs?  Do not leave any items on the stairs.  Make sure that there are handrails on both sides of the stairs and use them. Fix handrails that are broken or loose. Make sure that handrails are as long as the stairways.  Check any carpeting to make sure that it is firmly attached to the stairs. Fix any carpet that is loose or worn.  Avoid having throw rugs at the top or bottom of the stairs. If you do have throw rugs, attach them to the floor with carpet tape.  Make sure that you have a light switch at the top of the stairs and the bottom of the stairs. If you do not have them, ask someone to add them for you. What else can I do to help prevent falls?  Wear shoes that:  Do not have high heels.  Have rubber bottoms.  Are comfortable and fit you well.  Are closed at the toe. Do not wear sandals.  If you use a  stepladder:  Make sure that it is fully opened. Do not climb a closed stepladder.  Make sure that both sides of the stepladder are locked into place.  Ask someone to hold it for you, if possible.  Clearly mark and make sure that you can see:  Any grab bars or handrails.  First and last steps.  Where the edge of each step is.  Use tools that help you move around (mobility aids) if they are needed. These include:  Canes.  Walkers.  Scooters.  Crutches.  Turn on the lights when you go into a dark area. Replace any light bulbs as soon as they burn out.  Set up your furniture so you have a clear path. Avoid moving your furniture around.  If any of your floors are uneven, fix them.  If there are any pets around you, be aware of where they are.  Review your medicines with your doctor. Some medicines can make you feel dizzy. This can increase your chance of falling. Ask your doctor what other things that you can do to help prevent falls. This information is not intended to replace advice given to you by your health care provider. Make sure you discuss any questions you have with your health care provider. Document Released: 01/28/2009 Document Revised: 09/09/2015 Document Reviewed: 05/08/2014  2017 Elsevier  Health Maintenance, Male A healthy lifestyle and preventative care can promote health and wellness.  Maintain regular health, dental, and eye exams.  Eat a healthy diet. Foods like vegetables, fruits, whole grains, low-fat dairy products, and lean protein foods contain the nutrients you need and are low in calories. Decrease your intake of foods high in solid fats, added sugars, and salt. Get information about a proper diet from your health care provider, if necessary.  Regular physical exercise is one of the most important things you can do for your health. Most adults should get at least 150 minutes of moderate-intensity exercise (any activity that increases your heart  rate and causes you to sweat) each week. In addition, most adults need muscle-strengthening exercises on 2 or more days a week.   Maintain a healthy weight. The body mass index (BMI) is a screening tool to identify possible weight problems. It provides an estimate of body fat based on height and weight. Your health care provider can find your BMI and can help you achieve or maintain a healthy weight. For males 20 years and older:  A BMI below 18.5 is considered underweight.  A BMI of 18.5 to 24.9 is normal.  A BMI of 25 to 29.9 is considered overweight.  A BMI of 30 and above is considered obese.  Maintain normal blood lipids and cholesterol by exercising and minimizing your intake of saturated fat. Eat a balanced diet with plenty of fruits and vegetables. Blood tests for lipids and cholesterol should begin at age 64 and be repeated every 5 years. If your lipid or cholesterol levels are high, you are over age 58, or you are at high risk for heart disease, you may need your cholesterol levels checked more frequently.Ongoing high lipid and cholesterol levels should be treated with medicines if diet and exercise are not working.  If you smoke, find out from your health care provider how to quit. If you do not use tobacco, do not start.  Lung cancer screening  is recommended for adults aged 43-80 years who are at high risk for developing lung cancer because of a history of smoking. A yearly low-dose CT scan of the lungs is recommended for people who have at least a 30-pack-year history of smoking and are current smokers or have quit within the past 15 years. A pack year of smoking is smoking an average of 1 pack of cigarettes a day for 1 year (for example, a 30-pack-year history of smoking could mean smoking 1 pack a day for 30 years or 2 packs a day for 15 years). Yearly screening should continue until the smoker has stopped smoking for at least 15 years. Yearly screening should be stopped for people  who develop a health problem that would prevent them from having lung cancer treatment.  If you choose to drink alcohol, do not have more than 2 drinks per day. One drink is considered to be 12 oz (360 mL) of beer, 5 oz (150 mL) of wine, or 1.5 oz (45 mL) of liquor.  Avoid the use of street drugs. Do not share needles with anyone. Ask for help if you need support or instructions about stopping the use of drugs.  High blood pressure causes heart disease and increases the risk of stroke. High blood pressure is more likely to develop in:  People who have blood pressure in the end of the normal range (100-139/85-89 mm Hg).  People who are overweight or obese.  People who are African American.  If you are 72-68 years of age, have your blood pressure checked every 3-5 years. If you are 70 years of age or older, have your blood pressure checked every year. You should have your blood pressure measured twice-once when you are at a hospital or clinic, and once when you are not at a hospital or clinic. Record the average of the two measurements. To check your blood pressure when you are not at a hospital or clinic, you can use:  An automated blood pressure machine at a pharmacy.  A home blood pressure monitor.  If you are 88-51 years old, ask your health care provider if you should take aspirin to prevent heart disease.  Diabetes screening involves taking a blood sample to check your fasting blood sugar level. This should be done once every 3 years after age 52 if you are at a normal weight and without risk factors for diabetes. Testing should be considered at a younger age or be carried out more frequently if you are overweight and have at least 1 risk factor for diabetes.  Colorectal cancer can be detected and often prevented. Most routine colorectal cancer screening begins at the age of 46 and continues through age 55. However, your health care provider may recommend screening at an earlier age if  you have risk factors for colon cancer. On a yearly basis, your health care provider may provide home test kits to check for hidden blood in the stool. A small camera at the end of a tube may be used to directly examine the colon (sigmoidoscopy or colonoscopy) to detect the earliest forms of colorectal cancer. Talk to your health care provider about this at age 57 when routine screening begins. A direct exam of the colon should be repeated every 5-10 years through age 50, unless early forms of precancerous polyps or small growths are found.  People who are at an increased risk for hepatitis B should be screened for this virus. You are considered at high risk  for hepatitis B if:  You were born in a country where hepatitis B occurs often. Talk with your health care provider about which countries are considered high risk.  Your parents were born in a high-risk country and you have not received a shot to protect against hepatitis B (hepatitis B vaccine).  You have HIV or AIDS.  You use needles to inject street drugs.  You live with, or have sex with, someone who has hepatitis B.  You are a man who has sex with other men (MSM).  You get hemodialysis treatment.  You take certain medicines for conditions like cancer, organ transplantation, and autoimmune conditions.  Hepatitis C blood testing is recommended for all people born from [birth year] through 1963 and any individual with known risk factors for hepatitis C.  Healthy men should no longer receive prostate-specific antigen (PSA) blood tests as part of routine cancer screening. Talk to your health care provider about prostate cancer screening.  Testicular cancer screening is not recommended for adolescents or adult males who have no symptoms. Screening includes self-exam, a health care provider exam, and other screening tests. Consult with your health care provider about any symptoms you have or any concerns you have about testicular  cancer.  Practice safe sex. Use condoms and avoid high-risk sexual practices to reduce the spread of sexually transmitted infections (STIs).  You should be screened for STIs, including gonorrhea and chlamydia if:  You are sexually active and are younger than 24 years.  You are older than 24 years, and your health care provider tells you that you are at risk for this type of infection.  Your sexual activity has changed since you were last screened, and you are at an increased risk for chlamydia or gonorrhea. Ask your health care provider if you are at risk.  If you are at risk of being infected with HIV, it is recommended that you take a prescription medicine daily to prevent HIV infection. This is called pre-exposure prophylaxis (PrEP). You are considered at risk if:  You are a man who has sex with other men (MSM).  You are a heterosexual man who is sexually active with multiple partners.  You take drugs by injection.  You are sexually active with a partner who has HIV.  Talk with your health care provider about whether you are at high risk of being infected with HIV. If you choose to begin PrEP, you should first be tested for HIV. You should then be tested every 3 months for as long as you are taking PrEP.  Use sunscreen. Apply sunscreen liberally and repeatedly throughout the day. You should seek shade when your shadow is shorter than you. Protect yourself by wearing long sleeves, pants, a wide-brimmed hat, and sunglasses year round whenever you are outdoors.  Tell your health care provider of new moles or changes in moles, especially if there is a change in shape or color. Also, tell your health care provider if a mole is larger than the size of a pencil eraser.  A one-time screening for abdominal aortic aneurysm (AAA) and surgical repair of large AAAs by ultrasound is recommended for men aged 59-75 years who are current or former smokers.  Stay current with your vaccines  (immunizations). This information is not intended to replace advice given to you by your health care provider. Make sure you discuss any questions you have with your health care provider. Document Released: 09/30/2007 Document Revised: 04/24/2014 Document Reviewed: 01/05/2015 Elsevier Interactive Patient Education  2017 Elsevier Inc.  

## 2016-06-07 LAB — HM DIABETES EYE EXAM

## 2016-06-24 ENCOUNTER — Other Ambulatory Visit: Payer: Self-pay | Admitting: Family Medicine

## 2016-06-28 ENCOUNTER — Other Ambulatory Visit: Payer: Self-pay

## 2016-06-28 ENCOUNTER — Other Ambulatory Visit: Payer: Self-pay | Admitting: Family Medicine

## 2016-06-28 MED ORDER — GLUCOSE BLOOD VI STRP: ORAL_STRIP | 12 refills | Status: DC

## 2016-07-05 ENCOUNTER — Encounter: Payer: Self-pay | Admitting: Family Medicine

## 2016-07-07 ENCOUNTER — Other Ambulatory Visit: Payer: Self-pay | Admitting: Family Medicine

## 2016-07-19 ENCOUNTER — Other Ambulatory Visit: Payer: Self-pay

## 2016-07-19 MED ORDER — OMEPRAZOLE 20 MG PO CPDR: 20.0000 mg | DELAYED_RELEASE_CAPSULE | Freq: Every day | ORAL | 2 refills | Status: DC

## 2016-07-20 ENCOUNTER — Telehealth: Payer: Self-pay | Admitting: Family Medicine

## 2016-07-20 NOTE — Telephone Encounter (Signed)
Please see message below, please advise 

## 2016-07-20 NOTE — Telephone Encounter (Signed)
May write this on rx pad under M21.372 foot drop. I will sign then we can send to stated #.

## 2016-07-20 NOTE — Telephone Encounter (Signed)
Patient's daughter Roczen Waymire came in today wanting to know if MD would write a RX for the patient to a Teacher, adult education. Patient will get reimbursed if the MD writes the RX for this. Please fax the RX to [phone]  Contact Info: Kavon Valenza [phone]  Fax: [phone]

## 2016-07-21 ENCOUNTER — Telehealth: Payer: Self-pay | Admitting: Family Medicine

## 2016-07-21 NOTE — Telephone Encounter (Signed)
Called pt's daughter Brenen Beigel @ [phone] left message to return my phone call to the office.  A Rx was written and faxed over to [phone] for a [employer].  Someone from  That fax number called the office and stated this was the wrong place to please refax order to correct location, they do not sell lifts.

## 2016-07-21 NOTE — Telephone Encounter (Signed)
Rx was faxed.

## 2016-07-27 ENCOUNTER — Telehealth: Payer: Self-pay | Admitting: Internal Medicine

## 2016-07-27 ENCOUNTER — Telehealth: Payer: Self-pay | Admitting: Family Medicine

## 2016-07-27 NOTE — Telephone Encounter (Signed)
She need an order for physical therapy. Please fax [phone] DDU:[id number]

## 2016-07-27 NOTE — Telephone Encounter (Signed)
Steven [name] is calling from [facility] to obtain a referral for overall muscle weakness and atrophy, difficulty walking,and  balance trouble bilateral feet.  Can fax referral to [phone].

## 2016-07-27 NOTE — Telephone Encounter (Signed)
Returned call to [place] with [facility].She was calling to get a PT order for muscle weakness.She will call and obtain order from PCP.

## 2016-07-28 NOTE — Telephone Encounter (Signed)
Faxed order over as requested.

## 2016-09-06 ENCOUNTER — Inpatient Hospital Stay (HOSPITAL_COMMUNITY): Admission: RE | Admit: 2016-09-06 | Payer: Medicare Other | Source: Ambulatory Visit

## 2016-09-13 ENCOUNTER — Inpatient Hospital Stay (HOSPITAL_COMMUNITY): Admission: RE | Admit: 2016-09-13 | Payer: Medicare Other | Source: Ambulatory Visit

## 2016-09-14 ENCOUNTER — Other Ambulatory Visit: Payer: Self-pay | Admitting: Family Medicine

## 2016-09-22 ENCOUNTER — Ambulatory Visit (HOSPITAL_COMMUNITY)
Admission: RE | Admit: 2016-09-22 | Discharge: 2016-09-22 | Disposition: A | Discharge status: Home / Self Care | Payer: Medicare Other | Source: Ambulatory Visit | Attending: Cardiology | Admitting: Cardiology

## 2016-09-22 DIAGNOSIS — I6521 Occlusion and stenosis of right carotid artery: Secondary | ICD-10-CM | POA: Insufficient documentation

## 2016-09-26 ENCOUNTER — Ambulatory Visit (INDEPENDENT_AMBULATORY_CARE_PROVIDER_SITE_OTHER): Payer: Medicare Other | Admitting: Family Medicine

## 2016-09-26 ENCOUNTER — Encounter: Payer: Self-pay | Admitting: Family Medicine

## 2016-09-26 VITALS — BP 122/60 | HR 69 | Temp 97.9°F | Ht 63.5 in | Wt 163.8 lb

## 2016-09-26 DIAGNOSIS — E119 Type 2 diabetes mellitus without complications: Secondary | ICD-10-CM

## 2016-09-26 DIAGNOSIS — I25810 Atherosclerosis of coronary artery bypass graft(s) without angina pectoris: Secondary | ICD-10-CM | POA: Diagnosis not present

## 2016-09-26 DIAGNOSIS — I10 Essential (primary) hypertension: Secondary | ICD-10-CM

## 2016-09-26 DIAGNOSIS — E785 Hyperlipidemia, unspecified: Secondary | ICD-10-CM | POA: Diagnosis not present

## 2016-09-26 MED ORDER — ZOSTER VAC RECOMB ADJUVANTED 50 MCG/0.5ML IM SUSR: 0.5000 mL | Freq: Once | INTRAMUSCULAR | 1 refills | Status: AC

## 2016-09-26 MED ORDER — GLIMEPIRIDE 1 MG PO TABS: 1.0000 mg | ORAL_TABLET | Freq: Every day | ORAL | 3 refills | Status: DC

## 2016-09-26 NOTE — Assessment & Plan Note (Signed)
S: hopefully controlled on atorvastatin 80mg  with LDL goal under 70- increased from 40 mg last visit. No myalgias.  Lab Results  Component Value Date   CHOL 136 03/23/2016   HDL 38.50 (L) 03/23/2016   LDLCALC 84 03/23/2016   TRIG 70.0 03/23/2016   CHOLHDL 4 03/23/2016   A/P: update direct LDL today

## 2016-09-26 NOTE — Assessment & Plan Note (Signed)
S: controlled on diltiazem 180mg  XL, metoprolol 25mg  XL.  ASCVD 10 year risk calculation if age 81-79: on statin BP Readings from Last 3 Encounters:  09/26/16 122/60  05/02/16 130/70  03/28/16 134/60  A/P: We discussed blood pressure goal of <140/90. Continue current meds

## 2016-09-26 NOTE — Patient Instructions (Addendum)
Please stop by lab before you go  The glyburide you are on can cause low blood sugars more than a smilar medicine called glimepiride. I would be willing to switch this if you are agreeable.   Get shingrix at your pharmacy. Need 2 doses at least 2-6 months apart.   ______________________________________________________________________  Starting October 1st 2018, I will be transferring to our new location: [facility] (corner of [address] from [employer]) [place], [place] Phone: [phone]  I would love to have you remain my patient at this new location as long as it remains convenient for you. I am excited about the opportunity to have x-ray and sports medicine in the new building but will really miss the awesome staff and physicians at [facility]. Continue to schedule appointments at [facility] and we will automatically transfer them to the [place] location starting October 1st.

## 2016-09-26 NOTE — Progress Notes (Signed)
Subjective:  Steven [name] is a 83 y.o. year old very pleasant male patient who presents for/with See problem oriented charting ROS- No chest pain or shortness of breath. No headache or blurry vision.    Past Medical History-  Patient Active Problem List   Diagnosis Date Noted  . Osteoporosis 01/07/2015    Priority: High  . CAD, ARTERY BYPASS GRAFT 05/21/2009    Priority: High  . Carotid artery stenosis 04/20/2008    Priority: High  . Diabetes mellitus type II, controlled ([place]) 04/19/2007    Priority: High  . Anemia 09/03/2013    Priority: Medium  . Essential hypertension, benign 10/11/2012    Priority: Medium  . SVT (supraventricular tachycardia) ([place]) 01/25/2011    Priority: Medium  . Hyperlipidemia 04/19/2007    Priority: Medium  . History of acute inferior wall MI 09/03/2015    Priority: Low  . Foot drop, left 08/23/2012    Priority: Low  . Hypogonadism in male 02/04/2010    Priority: Low  . History of colonic polyps 09/07/2009    Priority: Low  . GERD 04/19/2007    Priority: Low    Medications- reviewed and updated Current Outpatient Prescriptions  Medication Sig Dispense Refill  . aspirin 81 MG tablet Take 81 mg by mouth daily.    Marland Kitchen atorvastatin (LIPITOR) 80 MG tablet Take 1 tablet (80 mg total) by mouth daily. 90 tablet 3  . cycloSPORINE (RESTASIS) 0.05 % ophthalmic emulsion 1 drop 2 (two) times daily.    Marland Kitchen diltiazem (CARDIZEM LA) 180 MG 24 hr tablet Take 1 tablet (180 mg total) by mouth daily. 90 tablet 2  . glucose blood test strip Use to test your blood sugar daily. E11.9 One touch Verio test strips 100 each 12  . ketoconazole (NIZORAL) 2 % shampoo APPLY 2 TIMES A WEEK 120 mL 3  . Lancets (ONETOUCH ULTRASOFT) lancets USE TO TEST BLOOD SUGARS DAILY. 100 each 3  . metFORMIN (GLUCOPHAGE) 1000 MG tablet TAKE 1 TABLET TWICE A DAY WITH MEALS 14 tablet 0  . metoprolol succinate (TOPROL-XL) 25 MG 24 hr tablet Take 1 tablet (25 mg total) by mouth daily. 90 tablet 2  .  omeprazole (PRILOSEC) 20 MG capsule Take 1 capsule (20 mg total) by mouth daily. 90 capsule 2  . ONETOUCH VERIO test strip USE TO TEST BLOOD SUGARS DAILY. 100 each 3  . glimepiride (AMARYL) 1 MG tablet Take 1 tablet (1 mg total) by mouth daily with breakfast. 90 tablet 3  . Zoster Vac Recomb Adjuvanted [facility]) injection Inject 0.5 mLs into the muscle once. Repeat injection in 2-6 months. Please inform [place] when completed. 0.5 mL 1   No current facility-administered medications for this visit.     Objective: BP 122/60 (BP Location: Left Arm, Patient Position: Sitting, Cuff Size: Large)   Pulse 69   Temp 97.9 F (36.6 C) (Oral)   Ht 5' 3.5" (1.613 m)   Wt 163 lb 12.8 oz (74.3 kg)   SpO2 97%   BMI 28.56 kg/m  Gen: NAD, resting comfortably, Appears stated age CV: RRR no murmurs rubs or gallops Lungs: CTAB no crackles, wheeze, rhonchi Abdomen: Overweight Ext: no edema Skin: warm, dry  Diabetic Foot Exam - Simple   Simple Foot Form Diabetic Foot exam was performed with the following findings:  Yes 09/26/2016  3:14 PM  Visual Inspection No deformities, no ulcerations, no other skin breakdown bilaterally:  Yes Sensation Testing Intact to touch and monofilament testing bilaterally:  Yes  Pulse Check Posterior Tibialis and Dorsalis pulse intact bilaterally:  Yes Comments Right foot- doesn't feel quite as well on monofilament as left foot but still feels it.     Assessment/Plan:  Diabetes mellitus type II, controlled S: well controlled. On glyburide 5mg  in AM, metformin 1000mg  BID CBGs- 141 14 day AM average Exercise and diet- going to PT with hand surgery- exercise limited by foot drop.  Lab Results  Component Value Date   HGBA1C 6.5 03/23/2016   HGBA1C 7.2 (H) 03/25/2014   HGBA1C 7.0 (H) 09/03/2013   A/P: update a1c. Change to glimepiride 1mg  due to glyburide being on beers list. Continue metformin. Patient states if he ever needs an injectable he would like trial because  of what he has read about it.  Hyperlipidemia S: hopefully controlled on atorvastatin 80mg  with LDL goal under 70- increased from 40 mg last visit. No myalgias.  Lab Results  Component Value Date   CHOL 136 03/23/2016   HDL 38.50 (L) 03/23/2016   LDLCALC 84 03/23/2016   TRIG 70.0 03/23/2016   CHOLHDL 4 03/23/2016   A/P: update direct LDL today  Essential hypertension, benign S: controlled on diltiazem 180mg  XL, metoprolol 25mg  XL.  ASCVD 10 year risk calculation if age 28-79: on statin BP Readings from Last 3 Encounters:  09/26/16 122/60  05/02/16 130/70  03/28/16 134/60  A/P: We discussed blood pressure goal of <140/90. Continue current meds  CAD, ARTERY BYPASS GRAFT Asymptomatic. Continues to follow with Dr. Debara Pickett. Remains on aspirin, metoprolol, statin. History of CABG  Return in about 6 months (around 03/28/2017) for physical.  Orders Placed This Encounter  Procedures  . LDL cholesterol, direct    [place]  . Hemoglobin A1c    [place]  . Basic metabolic panel    [place]    Meds ordered this encounter  Medications  . Zoster Vac Recomb Adjuvanted [facility]) injection    Sig: Inject 0.5 mLs into the muscle once. Repeat injection in 2-6 months. Please inform  when completed.    Dispense:  0.5 mL    Refill:  1  . glimepiride (AMARYL) 1 MG tablet    Sig: Take 1 tablet (1 mg total) by mouth daily with breakfast.    Dispense:  90 tablet    Refill:  3    Return precautions advised.  [name], MD

## 2016-09-26 NOTE — Assessment & Plan Note (Signed)
S: well controlled. On glyburide 5mg  in AM, metformin 1000mg  BID CBGs- 141 14 day AM average Exercise and diet- going to PT with hand surgery- exercise limited by foot drop.  Lab Results  Component Value Date   HGBA1C 6.5 03/23/2016   HGBA1C 7.2 (H) 03/25/2014   HGBA1C 7.0 (H) 09/03/2013   A/P: update a1c. Change to glimepiride 1mg  due to glyburide being on beers list. Continue metformin. Patient states if he ever needs an injectable he would like trial because of what he has read about it.

## 2016-09-26 NOTE — Assessment & Plan Note (Signed)
Asymptomatic. Continues to follow with Dr. Debara Pickett. Remains on aspirin, metoprolol, statin. History of CABG

## 2016-09-27 LAB — BASIC METABOLIC PANEL
BUN: 27 mg/dL — AB (ref 6–23)
CHLORIDE: 104 meq/L (ref 96–112)
CO2: 23 meq/L (ref 19–32)
Calcium: 9.7 mg/dL (ref 8.4–10.5)
Creatinine, Ser: 1.04 mg/dL (ref 0.40–1.50)
GFR: 72.4 mL/min (ref >60.00)
GLUCOSE: 86 mg/dL (ref 70–99)
POTASSIUM: 4.4 meq/L (ref 3.5–5.1)
Sodium: 139 mEq/L (ref 135–145)

## 2016-09-27 LAB — LDL CHOLESTEROL, DIRECT: Direct LDL: 75 mg/dL

## 2016-09-27 LAB — HEMOGLOBIN A1C: Hgb A1c MFr Bld: 6 % (ref 4.6–6.5)

## 2016-10-06 ENCOUNTER — Ambulatory Visit (INDEPENDENT_AMBULATORY_CARE_PROVIDER_SITE_OTHER): Payer: Medicare Other | Admitting: Internal Medicine

## 2016-10-06 ENCOUNTER — Encounter: Payer: Self-pay | Admitting: Internal Medicine

## 2016-10-06 VITALS — BP 146/70 | HR 73 | Ht 63.5 in | Wt 166.0 lb

## 2016-10-06 DIAGNOSIS — I77810 Thoracic aortic ectasia: Secondary | ICD-10-CM | POA: Diagnosis not present

## 2016-10-06 DIAGNOSIS — I6521 Occlusion and stenosis of right carotid artery: Secondary | ICD-10-CM

## 2016-10-06 DIAGNOSIS — I6523 Occlusion and stenosis of bilateral carotid arteries: Secondary | ICD-10-CM

## 2016-10-06 DIAGNOSIS — I25810 Atherosclerosis of coronary artery bypass graft(s) without angina pectoris: Secondary | ICD-10-CM

## 2016-10-06 DIAGNOSIS — E785 Hyperlipidemia, unspecified: Secondary | ICD-10-CM | POA: Diagnosis not present

## 2016-10-06 DIAGNOSIS — I10 Essential (primary) hypertension: Secondary | ICD-10-CM | POA: Diagnosis not present

## 2016-10-06 NOTE — Progress Notes (Signed)
OFFICE NOTE  Chief Complaint:  Routine follow-up  Primary Care Physician: Marin Olp, MD  HPI:  Steven [name] is a pleasant 83 year old male with history of coronary artery disease and coronary artery bypass grafting in 2004 after an inferior MI. He also has had a stent. He hasn't has diabetes, neuropathy, dyslipidemia, GERD and foot drop on the left for which he wears a brace. He has been followed by Dr. Wyatt Haste at with our cardiology. He saw her last in June of 2013 but reported since she is not seeing patients regularly in the office that he wished to see a different cardiologist. He has not had a stress test and a number of years. Recently he describes an episode where he was in his kitchen and felt a sharp pain in his left chest. This caused him to immediately turn his head violently to the right and he felt dizzy and then passed out. He does not remember falling but did catch himself and was confused when he was found fairly shortly afterwards on the floor. He does report he had an episode similar to this about one year ago where he fell backwards but was caught and did not totally pass out. He denies his heart racing or any periods of awareness of dizziness or blurred vision prior to the event.  The chest pain was described as sharp and sudden in the left chest like he was "punched" in the left chest. He has had no further chest pain or events.  He underwent nuclear stress testing recently which showed patent grafts and a small fixed inferior defect consistent with prior inferior MI. EF is preserved.  Steven [name] returns today for followup. He is feeling quite well. He has had no further syncopal events. He did have carotid Dopplers which showed moderate disease in the right and mild disease of the left. Is concerned about stroke.  I have assured him that that is unlikely this time and that we will continue to follow his carotid Dopplers closely. He has no chest pain complaints.  Blood pressure is well-controlled today.  09/03/2015  Steven [name] was seen today in the office in follow-up. Overall he seems to be doing pretty well. His carotid artery disease seems stable. He reports some shortness of breath on exertion. This is not necessarily worse than it did previously been. His EKG today however does show prominent Q waves in 3 and aVF. He does have a history of inferior MI however there are some new lateral T-wave inversions. For some reason the Q waves were previously present. His last echocardiogram did show inferior hypokinesis and 2015 however LVEF was preserved. Blood pressure is top normal today but he says well controlled at home. He is on Lipitor for dyslipidemia.  10/06/2016  Steven [name] was seen today in follow-up. Overall he seems to be doing well. He denies any worsening shortness of breath or chest pain. Fact he's "scared" that he's feeling so well. We've followed carotid Dopplers recently his study showed no significant interval change. He did have an echocardiogram last year which showed a small decrease in LV function do 50-55% but the aortic root was dilated to 4.4 cm. In 2015 the study and indicated his aortic root measured 3.8 cm. I discussed that with him today and the fact that it suggests that there is possibly aneurysmal dilatation or effacement of the aortic root, we would need to consider repeat imaging of this.   PMHx:  Past  Medical History:  Diagnosis Date  . Abnormality of gait 08/23/2012  . Anemia    hx of  . Arthritis 01-03-12   osteoarthritis-knees  . CAD (coronary artery disease) 2004   s/p inferior wall Mi 2004 with PTCA/Stent; s/p CABG 2004  . Degenerative arthritis   . Diabetes mellitus    type II  . Foot drop, bilateral 08/23/2012  . GERD (gastroesophageal reflux disease)   . Heart disease   . History of kidney stones   . History of myocardial infarction   . Hyperlipidemia   . Myocardial infarction [facility]) 01-03-12   '04  . MYOCARDIAL  INFARCTION, HX OF 04/19/2007   Qualifier: Diagnosis of  By: Scherrie Gerlach    . Neuropathy 01-03-12   bil. feet  . Pneumonia    hx of  . Polyneuropathy in diabetes(357.2) 08/23/2012  . Radiculopathy of lumbar region 08/23/2011   Followed by Dr. Vertell Limber. S/p surgery. Resolved after surgery.      Past Surgical History:  Procedure Laterality Date  . BACK SURGERY  2013  . CARDIAC CATHETERIZATION  01-03-12   '04  . CATARACT EXTRACTION Bilateral 2015  . COLONOSCOPY    . CORONARY ARTERY BYPASS GRAFT  2004   LIMA to LAD; SVG to ramus intermedius; SVG to PDA/PLSA  . LAMINECTOMY  09/08/2011   Procedure: LUMBAR LAMINECTOMY FOR TUMOR;  Surgeon: Erline Levine, MD;  Location: [place] [facility];  Service: Neurosurgery;  Laterality: Left;  Left Thoracic twelve-Lumbar one transpedicular resection of epidural mass  . TONSILLECTOMY  as child  . TOTAL KNEE ARTHROPLASTY  01/15/2012   Procedure: TOTAL KNEE ARTHROPLASTY;  Surgeon: Gearlean Alf, MD;  Location: [facility] ORS;  Service: Orthopedics;  Laterality: Right;  . TOTAL KNEE ARTHROPLASTY Left 09/16/2012   Procedure: LEFT TOTAL KNEE ARTHROPLASTY;  Surgeon: Gearlean Alf, MD;  Location: [facility] ORS;  Service: Orthopedics;  Laterality: Left;    FAMHx:  Family History  Problem Relation Age of Onset  . Cirrhosis Mother        etiology unclear  . Hypertension Father   . Stroke Father   . Cancer Sister        breast  . Stroke Maternal Grandmother   . Heart disease Maternal Grandfather        MI  . Stroke Paternal Grandmother   . Stroke Paternal Grandfather   . Anesthesia problems Neg Hx   . Hypotension Neg Hx   . Malignant hyperthermia Neg Hx   . Pseudochol deficiency Neg Hx     SOCHx:   reports that he has never smoked. He has never used smokeless tobacco. He reports that he does not drink alcohol or use drugs.  ALLERGIES:  Allergies  Allergen Reactions  . Altace [Ramipril] Other (See Comments)    hyperkalemia     ROS: Pertinent items noted in HPI and  remainder of comprehensive ROS otherwise negative.  HOME MEDS: Current Outpatient Prescriptions  Medication Sig Dispense Refill  . aspirin 81 MG tablet Take 81 mg by mouth daily.    Marland Kitchen atorvastatin (LIPITOR) 80 MG tablet Take 1 tablet (80 mg total) by mouth daily. 90 tablet 3  . cycloSPORINE (RESTASIS) 0.05 % ophthalmic emulsion 1 drop 2 (two) times daily.    Marland Kitchen diltiazem (CARDIZEM LA) 180 MG 24 hr tablet Take 1 tablet (180 mg total) by mouth daily. 90 tablet 2  . glimepiride (AMARYL) 1 MG tablet Take 1 tablet (1 mg total) by mouth daily with breakfast. 90 tablet 3  .  glucose blood test strip Use to test your blood sugar daily. E11.9 One touch Verio test strips 100 each 12  . ketoconazole (NIZORAL) 2 % shampoo APPLY 2 TIMES A WEEK 120 mL 3  . Lancets (ONETOUCH ULTRASOFT) lancets USE TO TEST BLOOD SUGARS DAILY. 100 each 3  . metFORMIN (GLUCOPHAGE) 1000 MG tablet TAKE 1 TABLET TWICE A DAY WITH MEALS 14 tablet 0  . metoprolol succinate (TOPROL-XL) 25 MG 24 hr tablet Take 1 tablet (25 mg total) by mouth daily. 90 tablet 2  . omeprazole (PRILOSEC) 20 MG capsule Take 1 capsule (20 mg total) by mouth daily. 90 capsule 2  . ONETOUCH VERIO test strip USE TO TEST BLOOD SUGARS DAILY. 100 each 3   No current facility-administered medications for this visit.     LABS/IMAGING: No results found for this or any previous visit (from the past 48 hour(s)). No results found.  VITALS: BP (!) 146/70   Pulse 73   Ht 5' 3.5" (1.613 m)   Wt 166 lb (75.3 kg)   BMI 28.94 kg/m   EXAM: General appearance: alert and no distress Neck: no carotid bruit and no JVD Lungs: clear to auscultation bilaterally Heart: regular rate and rhythm, S1, S2 normal and diastolic murmur: early diastolic 2/6, blowing at lower left sternal border Abdomen: soft, non-tender; bowel sounds normal; no masses,  no organomegaly Extremities: extremities normal, atraumatic, no cyanosis or edema Pulses: 2+ and symmetric Skin: Skin color,  texture, turgor normal. No rashes or lesions Neurologic: Mental status: Alert, oriented, thought content appropriate, walks with a walker Psych: pleasant  EKG: Normal sinus rhythm at 73, inferior T-wave changes - personally reviewed   ASSESSMENT: 1. History of syncope, likely vasovagal 2. Coronary artery disease status post CABG in 2004 3. History of inferior MI 4. Diabetes type 2 5. Peripheral neuropathy 6. HTN - controlled 7. Dyslipidemia 8. Low risk nuclear stress test 12/11/2012 9. Murmur 10. DOE - EF 50-55% (09/2015) 11. Dilated aortic root to 4.4 cm 12. Bilateral carotid artery disease  PLAN: 1.   Steven [name] says his shortness of breath has not changed significantly. His echo last year showed a dilated aortic root 4.4 cm/L repeat that study today and see if is been any change in LV function. Blood pressure is controlled today. Carotid Dopplers recently showed no significant worsening of his carotid artery stenosis which we will reevaluate in one year.  Follow-up annually or sooner as necessary.  Pixie Casino, MD, [facility] Attending Cardiologist [place] [facility] [place] 10/06/2016, 3:57 PM

## 2016-10-06 NOTE — Patient Instructions (Addendum)
Your physician has requested that you have an echocardiogram @ [address] - [address]rd [address]. Echocardiography is a painless test that uses sound waves to create images of your heart. It provides your doctor with information about the size and shape of your heart and how well your heart's chambers and valves are working. This procedure takes approximately one hour. There are no restrictions for this procedure.  Your physician has requested that you have a carotid duplex in ONE YEAR. This test is an ultrasound of the carotid arteries in your neck. It looks at blood flow through these arteries that supply the brain with blood. Allow one hour for this exam. There are no restrictions or special instructions.   Your physician wants you to follow-up in: ONE YEAR with Dr. Debara Pickett after your carotid doppler. You will receive a reminder letter in the mail two months in advance. If you don't receive a letter, please call our office to schedule the follow-up appointment.

## 2016-10-19 ENCOUNTER — Ambulatory Visit (HOSPITAL_COMMUNITY): Payer: Medicare Other | Attending: Cardiology

## 2016-10-19 ENCOUNTER — Other Ambulatory Visit: Payer: Self-pay

## 2016-10-19 DIAGNOSIS — I77810 Thoracic aortic ectasia: Secondary | ICD-10-CM | POA: Diagnosis present

## 2016-10-19 DIAGNOSIS — I517 Cardiomegaly: Secondary | ICD-10-CM | POA: Diagnosis not present

## 2016-11-01 ENCOUNTER — Telehealth: Payer: Self-pay | Admitting: Internal Medicine

## 2016-11-01 NOTE — Telephone Encounter (Signed)
New Message     Pt calling for his echo results

## 2016-11-01 NOTE — Telephone Encounter (Signed)
Notes recorded by Pixie Casino, MD on 10/23/2016 at 1:38 PM EDT Low normal systolic function - stable aortic root size.  Dr. Lemmie Evens   Patient aware and verbalized understanding.

## 2016-11-29 ENCOUNTER — Telehealth: Payer: Self-pay | Admitting: Family Medicine

## 2016-11-29 NOTE — Telephone Encounter (Signed)
Error

## 2016-12-01 ENCOUNTER — Other Ambulatory Visit: Payer: Self-pay

## 2016-12-01 MED ORDER — DILTIAZEM HCL ER COATED BEADS 180 MG PO TB24: 180.0000 mg | ORAL_TABLET | Freq: Every day | ORAL | 2 refills | Status: DC

## 2016-12-09 ENCOUNTER — Other Ambulatory Visit: Payer: Self-pay | Admitting: Family Medicine

## 2016-12-20 ENCOUNTER — Telehealth: Payer: Self-pay | Admitting: Family Medicine

## 2016-12-20 NOTE — Telephone Encounter (Signed)
° °  Pt call to say he received a letter from his insurance company [employer] asking why he is taking the below med. Pt said they told him they sent you the same information asking why he need to take this med. Pt siad he has been taking this med since he had by pass surgery. He is asking if Dr Yong Channel received this information because they told him they could not refill the med until they here back from the doctor    diltiazem (CARDIZEM LA) 180 MG 24 hr tablet

## 2016-12-20 NOTE — Telephone Encounter (Signed)
Please advise 

## 2016-12-21 NOTE — Telephone Encounter (Signed)
I dont remember receiving anything- he is on this for hypertension- please inform [employer] [facility] he does need this

## 2016-12-22 NOTE — Telephone Encounter (Signed)
Called and spoke with [employer]. I was informed they had shipped him out 90 day supply today. They had no notes of any questions regarding medication

## 2016-12-27 ENCOUNTER — Telehealth: Payer: Self-pay | Admitting: Family Medicine

## 2016-12-27 NOTE — Telephone Encounter (Signed)
Pt is calling to see if he should be taking Crestor and if he should he needs a refill.  Pharm:  [facility]

## 2016-12-27 NOTE — Telephone Encounter (Signed)
Patient is currently taking atorvastatin (LIPITOR) 80 MG tablet so I assume he does not need the Crestor too? Please confirm

## 2016-12-27 NOTE — Telephone Encounter (Signed)
Yes thanks. Should only be on atorvastatin 80mg .

## 2017-01-02 NOTE — Telephone Encounter (Signed)
Please see phone note where I called and spoke to [employer].

## 2017-01-23 ENCOUNTER — Other Ambulatory Visit: Payer: Self-pay | Admitting: *Deleted

## 2017-01-23 MED ORDER — METFORMIN HCL 1000 MG PO TABS: ORAL_TABLET | ORAL | 0 refills | Status: DC

## 2017-02-07 ENCOUNTER — Ambulatory Visit (INDEPENDENT_AMBULATORY_CARE_PROVIDER_SITE_OTHER): Payer: Medicare Other

## 2017-02-07 DIAGNOSIS — Z23 Encounter for immunization: Secondary | ICD-10-CM

## 2017-03-06 ENCOUNTER — Other Ambulatory Visit: Payer: Self-pay | Admitting: *Deleted

## 2017-03-06 MED ORDER — OMEPRAZOLE 20 MG PO CPDR: 20.0000 mg | DELAYED_RELEASE_CAPSULE | Freq: Every day | ORAL | 1 refills | Status: DC

## 2017-03-06 MED ORDER — ATORVASTATIN CALCIUM 80 MG PO TABS: 80.0000 mg | ORAL_TABLET | Freq: Every day | ORAL | 0 refills | Status: DC

## 2017-03-29 ENCOUNTER — Telehealth: Payer: Self-pay

## 2017-03-29 ENCOUNTER — Ambulatory Visit (INDEPENDENT_AMBULATORY_CARE_PROVIDER_SITE_OTHER): Payer: Medicare Other | Admitting: Family Medicine

## 2017-03-29 ENCOUNTER — Encounter: Payer: Self-pay | Admitting: Family Medicine

## 2017-03-29 VITALS — BP 130/78 | HR 54 | Temp 98.7°F | Ht 63.5 in | Wt 160.4 lb

## 2017-03-29 DIAGNOSIS — I25810 Atherosclerosis of coronary artery bypass graft(s) without angina pectoris: Secondary | ICD-10-CM

## 2017-03-29 DIAGNOSIS — Z0000 Encounter for general adult medical examination without abnormal findings: Secondary | ICD-10-CM

## 2017-03-29 DIAGNOSIS — E785 Hyperlipidemia, unspecified: Secondary | ICD-10-CM | POA: Diagnosis not present

## 2017-03-29 DIAGNOSIS — I6521 Occlusion and stenosis of right carotid artery: Secondary | ICD-10-CM | POA: Diagnosis not present

## 2017-03-29 DIAGNOSIS — I10 Essential (primary) hypertension: Secondary | ICD-10-CM | POA: Diagnosis not present

## 2017-03-29 DIAGNOSIS — M810 Age-related osteoporosis without current pathological fracture: Secondary | ICD-10-CM | POA: Diagnosis not present

## 2017-03-29 DIAGNOSIS — E119 Type 2 diabetes mellitus without complications: Secondary | ICD-10-CM

## 2017-03-29 LAB — POC URINALSYSI DIPSTICK (AUTOMATED)
BILIRUBIN UA: NEGATIVE
GLUCOSE UA: NEGATIVE
KETONES UA: NEGATIVE
Leukocytes, UA: NEGATIVE
Nitrite, UA: NEGATIVE
Protein, UA: 30
RBC UA: NEGATIVE
SPEC GRAV UA: 1.025 (ref 1.010–1.025)
UROBILINOGEN UA: 0.2 U/dL
pH, UA: 6 (ref 5.0–8.0)

## 2017-03-29 NOTE — Telephone Encounter (Signed)
Copied from [place] [id number]. Topic: Inquiry >> Mar 29, 2017  3:41 PM Arbutus Leas wrote: Patient was advised that he needs to schedule a bone density appointment. The patient has had a bone density performed in 2016. Although the patient filled out a medical records request, I can see the results in the patient's chart. The patient would like to know if he still needs to schedule a bone density scan. Please call patient to advise.

## 2017-03-29 NOTE — Telephone Encounter (Signed)
I had been looking in scanned in section- didn't realize under imaging. It has bene 3 years so I think it would make sense to get a new baseline as we are considering medication

## 2017-03-29 NOTE — Assessment & Plan Note (Signed)
Dr. Vertell Limber noted in 2015- we had been looking for records but I was informed under imaging today. -2.5 worst score 3 years ago. Will update DEXA and start medication like fosamax potentially

## 2017-03-29 NOTE — Progress Notes (Signed)
Phone: [phone]  Subjective:  Patient presents today for their annual physical. Chief complaint-noted.   See problem oriented charting- ROS- full  review of systems was completed and negative except for: urinary frequency  The following were reviewed and entered/updated in epic: Past Medical History:  Diagnosis Date  . Abnormality of gait 08/23/2012  . Anemia    hx of  . Arthritis 01-03-12   osteoarthritis-knees  . CAD (coronary artery disease) 2004   s/p inferior wall Mi 2004 with PTCA/Stent; s/p CABG 2004  . Degenerative arthritis   . Diabetes mellitus    type II  . Foot drop, bilateral 08/23/2012  . GERD (gastroesophageal reflux disease)   . Heart disease   . History of kidney stones   . History of myocardial infarction   . Hyperlipidemia   . Myocardial infarction [facility]) 01-03-12   '04  . MYOCARDIAL INFARCTION, HX OF 04/19/2007   Qualifier: Diagnosis of  By: Scherrie Gerlach    . Neuropathy 01-03-12   bil. feet  . Pneumonia    hx of  . Polyneuropathy in diabetes(357.2) 08/23/2012  . Radiculopathy of lumbar region 08/23/2011   Followed by Dr. Vertell Limber. S/p surgery. Resolved after surgery.     Patient Active Problem List   Diagnosis Date Noted  . Aortic root dilatation ([place]) 10/06/2016    Priority: High  . Osteoporosis 01/07/2015    Priority: High  . CAD, ARTERY BYPASS GRAFT 05/21/2009    Priority: High  . Carotid artery stenosis 04/20/2008    Priority: High  . Diabetes mellitus type II, controlled ([place]) 04/19/2007    Priority: High  . Anemia 09/03/2013    Priority: Medium  . Essential hypertension, benign 10/11/2012    Priority: Medium  . Hyperlipidemia 04/19/2007    Priority: Medium  . History of acute inferior wall MI 09/03/2015    Priority: Low  . Foot drop, left 08/23/2012    Priority: Low  . History of supraventricular tachycardia 01/25/2011    Priority: Low  . Hypogonadism in male 02/04/2010    Priority: Low  . History of colonic polyps 09/07/2009   Priority: Low  . GERD 04/19/2007    Priority: Low   Past Surgical History:  Procedure Laterality Date  . BACK SURGERY  2013  . CARDIAC CATHETERIZATION  01-03-12   '04  . CATARACT EXTRACTION Bilateral 2015  . COLONOSCOPY    . CORONARY ARTERY BYPASS GRAFT  2004   LIMA to LAD; SVG to ramus intermedius; SVG to PDA/PLSA  . LAMINECTOMY  09/08/2011   Procedure: LUMBAR LAMINECTOMY FOR TUMOR;  Surgeon: Erline Levine, MD;  Location: [facility];  Service: Neurosurgery;  Laterality: Left;  Left Thoracic twelve-Lumbar one transpedicular resection of epidural mass  . TONSILLECTOMY  as child  . TOTAL KNEE ARTHROPLASTY  01/15/2012   Procedure: TOTAL KNEE ARTHROPLASTY;  Surgeon: Gearlean Alf, MD;  Location: [facility];  Service: Orthopedics;  Laterality: Right;  . TOTAL KNEE ARTHROPLASTY Left 09/16/2012   Procedure: LEFT TOTAL KNEE ARTHROPLASTY;  Surgeon: Gearlean Alf, MD;  Location: [facility];  Service: Orthopedics;  Laterality: Left;    Family History  Problem Relation Age of Onset  . Cirrhosis Mother        etiology unclear  . Hypertension Father   . Stroke Father   . Cancer Sister        breast  . Stroke Maternal Grandmother   . Heart disease Maternal Grandfather        MI  .  Stroke Paternal Grandmother   . Stroke Paternal Grandfather   . Anesthesia problems Neg Hx   . Hypotension Neg Hx   . Malignant hyperthermia Neg Hx   . Pseudochol deficiency Neg Hx     Medications- reviewed and updated Current Outpatient Medications  Medication Sig Dispense Refill  . aspirin 81 MG tablet Take 81 mg by mouth daily.    Marland Kitchen atorvastatin (LIPITOR) 80 MG tablet Take 1 tablet (80 mg total) by mouth daily. 90 tablet 0  . cycloSPORINE (RESTASIS) 0.05 % ophthalmic emulsion 1 drop 2 (two) times daily.    Marland Kitchen diltiazem (CARDIZEM LA) 180 MG 24 hr tablet TAKE 1 TABLET DAILY 90 tablet 2  . glimepiride (AMARYL) 1 MG tablet Take 1 tablet (1 mg total) by mouth daily with breakfast. 90 tablet 3  . glucose blood test  strip Use to test your blood sugar daily. E11.9 One touch Verio test strips 100 each 12  . ketoconazole (NIZORAL) 2 % shampoo APPLY 2 TIMES A WEEK 120 mL 3  . Lancets (ONETOUCH ULTRASOFT) lancets USE TO TEST BLOOD SUGARS DAILY. 100 each 3  . metFORMIN (GLUCOPHAGE) 1000 MG tablet TAKE 1 TABLET TWICE A DAY WITH MEALS 180 tablet 0  . metoprolol succinate (TOPROL-XL) 25 MG 24 hr tablet TAKE 1 TABLET DAILY 90 tablet 2  . omeprazole (PRILOSEC) 20 MG capsule Take 1 capsule (20 mg total) by mouth daily. 90 capsule 1  . ONETOUCH VERIO test strip USE TO TEST BLOOD SUGARS DAILY. 100 each 3   No current facility-administered medications for this visit.     Allergies-reviewed and updated Allergies  Allergen Reactions  . Altace [Ramipril] Other (See Comments)    hyperkalemia     Social History   Socioeconomic History  . Marital status: Married    Spouse name: None  . Number of children: 2  . Years of education: College  . Highest education level: None  Social Needs  . Financial resource strain: None  . Food insecurity - worry: None  . Food insecurity - inability: None  . Transportation needs - medical: None  . Transportation needs - non-medical: None  Occupational History  . Occupation: Research scientist (life sciences)  Tobacco Use  . Smoking status: Never Smoker  . Smokeless tobacco: Never Used  Substance and Sexual Activity  . Alcohol use: No  . Drug use: No  . Sexual activity: Not Currently  Other Topics Concern  . None  Social History Narrative   Married 1956.  2 children. No grandkids. Neither children married.    1 daughter lives at house and watches over parents.       Retired from [employer]. Worked for hisself afterwards.       Regular exercise: no   Daily Caffeine: 3 cups coffee daily    Objective: BP 130/78 (BP Location: Left Arm, Patient Position: Sitting, Cuff Size: Large)   Pulse (!) 54   Temp 98.7 F (37.1 C) (Oral)   Ht 5' 3.5" (1.613 m)   Wt 160 lb 6.4  oz (72.8 kg)   SpO2 99%   BMI 27.97 kg/m  Gen: NAD, resting comfortably HEENT: Mucous membranes are moist. Oropharynx normal Neck: no thyromegaly CV: RRR no murmurs rubs or gallops Lungs: CTAB no crackles, wheeze, rhonchi Abdomen: soft/nontender/nondistended/normal bowel sounds. No rebound or guarding.  Ext: trace edema Skin: warm, dry Neuro: grossly normal, moves all extremities, PERRLA Rectal: normal tone, normal sized prostate, no masses or tenderness  Diabetic Foot Exam - Simple  Simple Foot Form Diabetic Foot exam was performed with the following findings:  Yes 03/29/2017  3:02 PM  Visual Inspection No deformities, no ulcerations, no other skin breakdown bilaterally:  Yes Sensation Testing Intact to touch and monofilament testing bilaterally:  Yes Pulse Check Posterior Tibialis and Dorsalis pulse intact bilaterally:  Yes Comments Onychomycosis       Assessment/Plan:  83 y.o. male presenting for annual physical.  Health Maintenance counseling: 1. Anticipatory guidance: Patient counseled regarding regular dental exams -q6 months, eye exams -yearly, wearing seatbelts.  2. Risk factor reduction:  Advised patient of need for regular exercise and diet rich and fruits and vegetables to reduce risk of heart attack and stroke. Exercise- doing this with PT hand for an hour. Diet-reasonable- weight stable.  Wt Readings from Last 3 Encounters:  03/29/17 160 lb 6.4 oz (72.8 kg)  10/06/16 166 lb (75.3 kg)  09/26/16 163 lb 12.8 oz (74.3 kg)  3. Immunizations/screenings/ancillary studies- discussed shingrix at pharmacy  Immunization History  Administered Date(s) Administered  . Influenza Split 01/25/2011, 01/16/2012  . Influenza Whole 04/26/2007, 01/27/2008, 02/09/2009, 01/18/2010  . Influenza, High Dose Seasonal PF 01/06/2016, 02/07/2017  . Influenza,inj,Quad PF,6+ Mos 12/30/2012, 01/15/2014, 12/25/2014  . Pneumococcal Conjugate-13 02/03/2013  . Pneumococcal Polysaccharide-23  02/25/1998, 01/25/2011  . Td 02/25/1998, 12/22/2008  . Zoster 01/27/2008  4. Prostate cancer screening- no longer screening due to age . Is having new polyuria (about once an hour in the day and nocturia twice a night)so will check UA and rectal - no obvious tenderness to suggest prostatitis or mass to suggest cancer. At his age and with comorbid conditions we discussed doing PSA but will hold off for now unless symptoms worsen.  Lab Results  Component Value Date   PSA 1.01 02/03/2013   PSA 1.61 05/09/2010   PSA 1.11 01/18/2010   5. Colon cancer screening - letter 07/27/14 from Dr. Henrene Pastor recommended no further colonoscopy despite adenoma history based on age.  6. Skin cancer screening- advised regular sunscreen use. Denies worrisome, changing, or new skin lesions.   Status of chronic or acute concerns   HLD- on atorvastatin 80mg  with LDL goal under 70, update lipids  HTN- at goal on diltiazem 180mg  XL, metoprolol 25mg  XL  CAD with CABG hx- asymptomatic and following with Dr. Debara Pickett. On aspirin, metoprolol, statin.  Carotid stenosis - followed by cardiology- last seen 10/06/16. Stable. Also has aortic root dilation which Dr. Debara Pickett is following.   Still doing PT hand and helpful.   Walking better without cane- has walker today. Still using bilateral AFOs.   Mild anemia- stable for years with unclear cause- update cbc to make sure stable  Diabetes mellitus type II, controlled S: well controlled last visit On glyburide 5mg  in AM last visit (we changed to glimepiride 1mg  due to glyburide being on beer's list), also on metofrmin 1 g BID CBGs- all under 160. One at 68 when missed breakfast- advised against Lab Results  Component Value Date   HGBA1C 6.0 09/26/2016   HGBA1C 6.5 03/23/2016   HGBA1C 7.2 (H) 03/25/2014   A/P:  update a1c. Continue metformin and glimepiride at above doses.   Osteoporosis Dr. Vertell Limber noted in 2015- we had been looking for records but I was informed under imaging  today. -2.5 worst score 3 years ago. Will update DEXA and start medication like fosamax potentially   Future Appointments  Date Time Provider [place]  04/03/2017 10:15 AM [facility]  10/08/2017  2:30 PM  [facility] VASC 1 [facility]   6 months at latest with diabetes- not written in though on avs  Orders Placed This Encounter  Procedures  . DG Bone Density    Standing Status:   Future    Standing Expiration Date:   05/30/2018    Order Specific Question:   Reason for Exam (SYMPTOM  OR DIAGNOSIS REQUIRED)    Answer:   osteoporosis follow up    Order Specific Question:   Preferred imaging location?    Answer:   [facility]  . Hemoglobin A1c    [place]    Standing Status:   Future    Standing Expiration Date:   03/29/2018  . CBC    Standing Status:   Future    Standing Expiration Date:   03/29/2018  . Comprehensive metabolic panel    [place]    Standing Status:   Future    Standing Expiration Date:   03/29/2018  . Lipid panel    Standing Status:   Future    Standing Expiration Date:   03/29/2018  . POCT Urinalysis Dipstick (Automated)    Standing Status:   Future    Number of Occurrences:   1    Standing Expiration Date:   04/29/2017  future fasting  Return precautions advised.  [name], MD

## 2017-03-29 NOTE — Assessment & Plan Note (Signed)
S: well controlled last visit On glyburide 5mg  in AM last visit (we changed to glimepiride 1mg  due to glyburide being on beer's list), also on metofrmin 1 g BID CBGs- all under 160. One at 30 when missed breakfast- advised against Lab Results  Component Value Date   HGBA1C 6.0 09/26/2016   HGBA1C 6.5 03/23/2016   HGBA1C 7.2 (H) 03/25/2014   A/P:  update a1c. Continue metformin and glimepiride at above doses.

## 2017-03-29 NOTE — Patient Instructions (Addendum)
Schedule your bone density test at check out desk. It will be at the [place] location.   If we start you on osteoporosis medicine then we would recommend 800 units vitamin D each day as well as 1200 mg of calcium. We will let you know after you get your bone density done.   Please stop by lab before you go

## 2017-03-30 ENCOUNTER — Telehealth: Payer: Self-pay

## 2017-03-30 NOTE — Telephone Encounter (Signed)
Please call the patient to be scheduled for a bone density appointment.

## 2017-03-30 NOTE — Telephone Encounter (Signed)
Copied from [place] [id number]. Topic: Inquiry >> Mar 29, 2017  3:41 PM Arbutus Leas wrote: Patient was advised that he needs to schedule a bone density appointment. The patient has had a bone density performed in 2016. Although the patient filled out a medical records request, I can see the results in the patient's chart. The patient would like to know if he still needs to schedule a bone density scan. Please call patient to advise.    Called and spoke with patient who will be scheduled for a bone density.

## 2017-03-30 NOTE — Telephone Encounter (Signed)
Please schedule for DEXA (bone density)

## 2017-03-30 NOTE — Telephone Encounter (Signed)
Called and spoke to patient who verbalized understanding  

## 2017-03-30 NOTE — Telephone Encounter (Signed)
The patient has been scheduled

## 2017-04-03 ENCOUNTER — Other Ambulatory Visit (INDEPENDENT_AMBULATORY_CARE_PROVIDER_SITE_OTHER): Payer: Medicare Other

## 2017-04-03 DIAGNOSIS — E119 Type 2 diabetes mellitus without complications: Secondary | ICD-10-CM

## 2017-04-03 DIAGNOSIS — I10 Essential (primary) hypertension: Secondary | ICD-10-CM | POA: Diagnosis not present

## 2017-04-03 DIAGNOSIS — E785 Hyperlipidemia, unspecified: Secondary | ICD-10-CM | POA: Diagnosis not present

## 2017-04-03 LAB — COMPREHENSIVE METABOLIC PANEL
ALK PHOS: 70 U/L (ref 39–117)
ALT: 14 U/L (ref 0–53)
AST: 16 U/L (ref 0–37)
Albumin: 3.9 g/dL (ref 3.5–5.2)
BUN: 25 mg/dL — AB (ref 6–23)
CO2: 27 meq/L (ref 19–32)
Calcium: 9.4 mg/dL (ref 8.4–10.5)
Chloride: 107 mEq/L (ref 96–112)
Creatinine, Ser: 1.23 mg/dL (ref 0.40–1.50)
GFR: 59.58 mL/min — ABNORMAL LOW (ref >60.00)
GLUCOSE: 126 mg/dL — AB (ref 70–99)
POTASSIUM: 4.8 meq/L (ref 3.5–5.1)
SODIUM: 140 meq/L (ref 135–145)
TOTAL PROTEIN: 6.8 g/dL (ref 6.0–8.3)
Total Bilirubin: 0.7 mg/dL (ref 0.2–1.2)

## 2017-04-03 LAB — LIPID PANEL
Cholesterol: 121 mg/dL (ref 0–200)
HDL: 38.9 mg/dL — ABNORMAL LOW (ref >39.00)
LDL Cholesterol: 67 mg/dL (ref 0–99)
NONHDL: 81.62
Total CHOL/HDL Ratio: 3
Triglycerides: 75 mg/dL (ref 0.0–149.0)
VLDL: 15 mg/dL (ref 0.0–40.0)

## 2017-04-03 LAB — CBC
HEMATOCRIT: 36.1 % — AB (ref 39.0–52.0)
HEMOGLOBIN: 11.8 g/dL — AB (ref 13.0–17.0)
MCHC: 32.5 g/dL (ref 30.0–36.0)
MCV: 95.5 fl (ref 78.0–100.0)
Platelets: 252 10*3/uL (ref 150.0–400.0)
RBC: 3.78 Mil/uL — ABNORMAL LOW (ref 4.22–5.81)
RDW: 13.9 % (ref 11.5–15.5)
WBC: 6.2 10*3/uL (ref 4.0–10.5)

## 2017-04-03 LAB — HEMOGLOBIN A1C: Hgb A1c MFr Bld: 6 % (ref 4.6–6.5)

## 2017-04-05 ENCOUNTER — Ambulatory Visit (INDEPENDENT_AMBULATORY_CARE_PROVIDER_SITE_OTHER)
Admission: RE | Admit: 2017-04-05 | Discharge: 2017-04-05 | Disposition: A | Discharge status: Home / Self Care | Payer: Medicare Other | Source: Ambulatory Visit | Attending: Family Medicine | Admitting: Family Medicine

## 2017-04-05 DIAGNOSIS — M810 Age-related osteoporosis without current pathological fracture: Secondary | ICD-10-CM

## 2017-04-06 ENCOUNTER — Other Ambulatory Visit: Payer: Self-pay

## 2017-04-06 MED ORDER — ALENDRONATE SODIUM 70 MG PO TABS: 70.0000 mg | ORAL_TABLET | ORAL | 11 refills | Status: DC

## 2017-05-10 ENCOUNTER — Telehealth: Payer: Self-pay

## 2017-05-10 NOTE — Telephone Encounter (Signed)
Call to Steven [name] regarding scheduling AWV. [name] states he not walking as he was and has son carrying him to apts. Dtr is staying with them and providing care for him and his wife.  Still going to PT  Offered to see him when he sees Dr. Yong Channel again to make it easier on his family. He no longer drives.   Still living in home with support.

## 2017-05-17 ENCOUNTER — Other Ambulatory Visit: Payer: Self-pay

## 2017-05-17 MED ORDER — METFORMIN HCL 1000 MG PO TABS: ORAL_TABLET | ORAL | 1 refills | Status: DC

## 2017-06-13 LAB — HM DIABETES EYE EXAM

## 2017-06-27 ENCOUNTER — Encounter: Payer: Self-pay | Admitting: Family Medicine

## 2017-07-09 ENCOUNTER — Other Ambulatory Visit: Payer: Self-pay

## 2017-07-09 MED ORDER — ATORVASTATIN CALCIUM 80 MG PO TABS: 80.0000 mg | ORAL_TABLET | Freq: Every day | ORAL | 1 refills | Status: DC

## 2017-07-09 MED ORDER — GLIMEPIRIDE 1 MG PO TABS: 1.0000 mg | ORAL_TABLET | Freq: Every day | ORAL | 1 refills | Status: DC

## 2017-08-20 ENCOUNTER — Ambulatory Visit: Payer: Self-pay | Admitting: *Deleted

## 2017-08-20 NOTE — Telephone Encounter (Signed)
Pt reports that he has fallen in the same manner  3 times since 06/01/17.t  Pt states the last fall was on 4/29 while in his office and he fell  In between some boxes and hit his left elbow but not his head. Pt states that he also fell on 07/04/17 he went into the kitchen to throw a banana in the trash turned to the left and he fell to the floor like "a bag of water". Pt also reports that on 06/01/17 he walked out onto his deck turned to the left and also fell like "a bag of water".  Pt states he falls without notice and denies any dizziness, CP, SOB, extremity weakness or any other symptoms at this time or at the time of the falls. Pt states that each time he fell he did have family to assist him with getting up and did not experience LOC or head injuries Pt states he has braces on bilateral legs and uses a cane and/or a walker with walking but has not had any difficulty with using the assistive devices. Pt also denies any pain at this time. Appt made for 5/7 with Dr. Yong Channel. Pt advised to call the office back if symptoms develop before seen for appt and also advised to call 911 if he falls and has no one in the home to assist him with getting up. Pt verbalized understanding.   Reason for Disposition . Nursing judgment or information in reference  Answer Assessment - Initial Assessment Questions 1. REASON FOR CALL: "What is your main concern right now?"     Falls 2. ONSET: "When did the ___ start?"     06/01/17 3. SEVERITY: "How bad is the ___?"     Has happened 3 times 4. FEVER: "Do you have a fever?"     n/a 5. OTHER SYMPTOMS: "Do you have any other new symptoms?"     No 6. INTERVENTIONS AND RESPONSE: "What have you done so far to try to make this better? What medications have you used?"     Nothing, not on any new medications  Protocols used: NO GUIDELINE AVAILABLE-A-AH

## 2017-08-21 ENCOUNTER — Ambulatory Visit (INDEPENDENT_AMBULATORY_CARE_PROVIDER_SITE_OTHER): Payer: Medicare Other | Admitting: Family Medicine

## 2017-08-21 ENCOUNTER — Encounter: Payer: Self-pay | Admitting: Family Medicine

## 2017-08-21 VITALS — BP 128/72 | HR 76 | Temp 98.6°F | Ht 63.5 in | Wt 157.4 lb

## 2017-08-21 DIAGNOSIS — R358 Other polyuria: Secondary | ICD-10-CM | POA: Diagnosis not present

## 2017-08-21 DIAGNOSIS — E119 Type 2 diabetes mellitus without complications: Secondary | ICD-10-CM | POA: Diagnosis not present

## 2017-08-21 DIAGNOSIS — I77810 Thoracic aortic ectasia: Secondary | ICD-10-CM | POA: Diagnosis not present

## 2017-08-21 DIAGNOSIS — R3589 Other polyuria: Secondary | ICD-10-CM

## 2017-08-21 DIAGNOSIS — R300 Dysuria: Secondary | ICD-10-CM | POA: Diagnosis not present

## 2017-08-21 DIAGNOSIS — R296 Repeated falls: Secondary | ICD-10-CM | POA: Insufficient documentation

## 2017-08-21 DIAGNOSIS — D692 Other nonthrombocytopenic purpura: Secondary | ICD-10-CM | POA: Insufficient documentation

## 2017-08-21 LAB — POC URINALSYSI DIPSTICK (AUTOMATED)
BILIRUBIN UA: NEGATIVE
Ketones, UA: NEGATIVE
Leukocytes, UA: NEGATIVE
NITRITE UA: NEGATIVE
Protein, UA: NEGATIVE
RBC UA: NEGATIVE
SPEC GRAV UA: 1.02 (ref 1.010–1.025)
Urobilinogen, UA: 1 E.U./dL
pH, UA: 5 (ref 5.0–8.0)

## 2017-08-21 LAB — POCT GLYCOSYLATED HEMOGLOBIN (HGB A1C): Hemoglobin A1C: 5.5

## 2017-08-21 NOTE — Assessment & Plan Note (Signed)
S: well controlled on metformin 1000 mill grams twice daily and Amaryl 1 mg daily CBGs- denies any low blood sugars. Encouraged to check cbg with falls to make sure not low Lab Results  Component Value Date   HGBA1C 5.5 08/21/2017   HGBA1C 6.0 04/03/2017   HGBA1C 6.0 09/26/2016   A/P: I asked him to make sure his blood sugar is not low if he has another fall.  Also with excellent A1c control we will stop his glimepiride

## 2017-08-21 NOTE — Progress Notes (Signed)
Subjective:  Steven [name] is a 84 y.o. year old very pleasant male patient who presents for/with See problem oriented charting ROS-no chest pain or shortness of breath.  No lightheadedness or dizziness.  Denies weakness in legs or arms at baseline  Past Medical History-  Patient Active Problem List   Diagnosis Date Noted  . Falls frequently 08/21/2017    Priority: High  . Aortic root dilatation ([place]) 10/06/2016    Priority: High  . Osteoporosis 01/07/2015    Priority: High  . CAD, ARTERY BYPASS GRAFT 05/21/2009    Priority: High  . Carotid artery stenosis 04/20/2008    Priority: High  . Diabetes mellitus type II, controlled ([place]) 04/19/2007    Priority: High  . Anemia 09/03/2013    Priority: Medium  . Essential hypertension, benign 10/11/2012    Priority: Medium  . Hyperlipidemia 04/19/2007    Priority: Medium  . History of acute inferior wall MI 09/03/2015    Priority: Low  . Foot drop, left 08/23/2012    Priority: Low  . History of supraventricular tachycardia 01/25/2011    Priority: Low  . Hypogonadism in male 02/04/2010    Priority: Low  . History of colonic polyps 09/07/2009    Priority: Low  . GERD 04/19/2007    Priority: Low  . Senile purpura ([place]) 08/21/2017    Medications- reviewed and updated Current Outpatient Medications  Medication Sig Dispense Refill  . alendronate (FOSAMAX) 70 MG tablet Take 1 tablet (70 mg total) by mouth every 7 (seven) days. Take with a full glass of water on an empty stomach. 5 tablet 11  . aspirin 81 MG tablet Take 81 mg by mouth daily.    Marland Kitchen atorvastatin (LIPITOR) 80 MG tablet Take 1 tablet (80 mg total) by mouth daily. 90 tablet 1  . cycloSPORINE (RESTASIS) 0.05 % ophthalmic emulsion 1 drop 2 (two) times daily.    Marland Kitchen diltiazem (CARDIZEM LA) 180 MG 24 hr tablet TAKE 1 TABLET DAILY 90 tablet 2  . glimepiride (AMARYL) 1 MG tablet Take 1 tablet (1 mg total) by mouth daily with breakfast. 90 tablet 1  . glucose blood test strip Use to  test your blood sugar daily. E11.9 One touch Verio test strips 100 each 12  . ketoconazole (NIZORAL) 2 % shampoo APPLY 2 TIMES A WEEK 120 mL 3  . Lancets (ONETOUCH ULTRASOFT) lancets USE TO TEST BLOOD SUGARS DAILY. 100 each 3  . metFORMIN (GLUCOPHAGE) 1000 MG tablet TAKE 1 TABLET TWICE A DAY WITH MEALS 180 tablet 1  . metoprolol succinate (TOPROL-XL) 25 MG 24 hr tablet TAKE 1 TABLET DAILY 90 tablet 2  . omeprazole (PRILOSEC) 20 MG capsule Take 1 capsule (20 mg total) by mouth daily. 90 capsule 1  . ONETOUCH VERIO test strip USE TO TEST BLOOD SUGARS DAILY. 100 each 3   No current facility-administered medications for this visit.     Objective: BP 128/72 (BP Location: Left Arm, Patient Position: Sitting, Cuff Size: Normal)   Pulse 76   Temp 98.6 F (37 C) (Oral)   Ht 5' 3.5" (1.613 m)   Wt 157 lb 6.4 oz (71.4 kg)   SpO2 96%   BMI 27.44 kg/m  Gen: NAD, resting comfortably CV: RRR no murmurs rubs or gallops Lungs: CTAB no crackles, wheeze, rhonchi Abdomen: soft/nontender/nondistended/normal bowel sounds.  No suprapubic pain Ext: no edema Skin: warm, dry, bruises on hands and forearms noted Neuro: Wears bilateral AFOs.  No weakness in lower extremities noted.  Walks  with walker.  Able to rise from chair reasonably well.  Normal speech  Assessment/Plan:  Polyuria and dysuria  s: polyuria in last 3-6 months. Peeing 3x a night. Occasional dysuria A/P: UA not very concerning for urinary tract infection.  We will rule this out with urine culture.  Primarily checking this as timing seems to line up with his falls  Falls frequently S: has had 3 falls over last 3 months. [place] on deck first time on 06/01/17- stepped on mat but didn't make him slip. Didn't feel weak or dizzy. Denies LOC. Went straight down and got straight up. Did not have his walker with him. Had got up from sitting right before this  Second time fell in kitchen- taking banana peel to the kitchen- just fell down once he got to  the kitchen. Denies dizziness or lightheadedness. Was able to get back up. Hard to stand up due to his AFOs but was able. Had been standing eating the banana  Last time was 08/13/17 at his office- scratched his left elbow with this fall. Once again. No dizziness. No chest pain. No shortness of breath. No warning or sign this was coming on.   Since that time has started using walker all the time- had been not using anything when he had his falls because only walking short distance  High risk for falls as he has to use bilateral AFOs. Had been walking with walker last visit  Does PT for hand and they also help with leg strengthening A/P: Patient with 3 falls over the last 3 months.  High risk for falls with age, use of bilateral AFOs at baseline.  Also high risk for fall complication with osteoporosis.  I have asked him to use his walker at all times previously but he has not been doing this around the house at the time of his falls-I strongly encouraged him to use his walker at all times.  He is already working with PT-I instructed him to work on gait and balance training with them.  We can also do a referral to our physical therapist if needed.  I think his falls are multifactorial  There is no lightheadedness, chest pain, shortness of breath, palpitations with these episodes.  Strongly doubt arrhythmia issue.  No loss of consciousness either.  Diabetes mellitus type II, controlled S: well controlled on metformin 1000 mill grams twice daily and Amaryl 1 mg daily CBGs- denies any low blood sugars. Encouraged to check cbg with falls to make sure not low Lab Results  Component Value Date   HGBA1C 5.5 08/21/2017   HGBA1C 6.0 04/03/2017   HGBA1C 6.0 09/26/2016   A/P: I asked him to make sure his blood sugar is not low if he has another fall.  Also with excellent A1c control we will stop his glimepiride  Senile purpura (HCC) He notes easy bruising on his hands and arms even when he is not having  falls.  We discussed his age and aspirin contribute to this certainly  Aortic root dilatation ([facility]) Stable aortic root size in July 2018.  He will follow-up with cardiology again this year  Future Appointments  Date Time Provider [place]  10/08/2017  2:30 PM [facility] VASC 1 [facility]   We will reach out to him to set up six-month follow-up  Lab/Order associations: Polyuria - Plan: POCT Urinalysis Dipstick (Automated), Urine Culture  Dysuria - Plan: POCT Urinalysis Dipstick (Automated), Urine Culture  Controlled type 2 diabetes mellitus without complication,  without long-term current use of insulin ([place]) - Plan: POCT glycosylated hemoglobin (Hb A1C)  Return precautions advised.  [name], MD

## 2017-08-21 NOTE — Patient Instructions (Signed)
Please stop by lab before you go to drop off urine  Please use your walker regularly  Lets get them to prick your finger for a1c before you go to make sure under 7- then you can go to lab  Tell PT about your falls and have them work with you on gait and balance training

## 2017-08-21 NOTE — Assessment & Plan Note (Addendum)
S: has had 3 falls over last 3 months. [place] on [place] first time on 06/01/17- stepped on mat but didn't make him slip. Didn't feel weak or dizzy. Denies LOC. Went straight down and got straight up. Did not have his walker with him. Had got up from sitting right before this  Second time fell in kitchen- taking banana peel to the kitchen- just fell down once he got to the kitchen. Denies dizziness or lightheadedness. Was able to get back up. Hard to stand up due to his AFOs but was able. Had been standing eating the banana  Last time was 08/13/17 at his office- scratched his left elbow with this fall. Once again. No dizziness. No chest pain. No shortness of breath. No warning or sign this was coming on.   Since that time has started using walker all the time- had been not using anything when he had his falls because only walking short distance  High risk for falls as he has to use bilateral AFOs. Had been walking with walker last visit  Does PT for hand and they also help with leg strengthening A/P: Patient with 3 falls over the last 3 months.  High risk for falls with age, use of bilateral AFOs at baseline.  Also high risk for fall complication with osteoporosis.  I have asked him to use his walker at all times previously but he has not been doing this around the house at the time of his falls-I strongly encouraged him to use his walker at all times.  He is already working with PT-I instructed him to work on gait and balance training with them.  We can also do a referral to our physical therapist if needed.  I think his falls are multifactorial  There is no lightheadedness, chest pain, shortness of breath, palpitations with these episodes.  Strongly doubt arrhythmia issue.  No loss of consciousness either.

## 2017-08-21 NOTE — Progress Notes (Signed)
Hemoglobin A1c looks great at 5.5.  Lets have him stop the glimepiride 1 mg-please remove this from his medication list  Urine does not have an obvious infection.  We will still await the urine culture.

## 2017-08-21 NOTE — Assessment & Plan Note (Signed)
Stable aortic root size in July 2018.  He will follow-up with cardiology again this year

## 2017-08-21 NOTE — Assessment & Plan Note (Signed)
He notes easy bruising on his hands and arms even when he is not having falls.  We discussed his age and aspirin contribute to this certainly

## 2017-08-22 ENCOUNTER — Telehealth: Payer: Self-pay

## 2017-08-22 ENCOUNTER — Other Ambulatory Visit: Payer: Self-pay

## 2017-08-22 LAB — URINE CULTURE
MICRO NUMBER: 90555615
RESULT: NO GROWTH
SPECIMEN QUALITY:: ADEQUATE

## 2017-08-22 NOTE — Progress Notes (Signed)
LVM to schedule

## 2017-08-22 NOTE — Telephone Encounter (Signed)
Called patient with lab results. Patient verbalized understanding.  

## 2017-08-24 ENCOUNTER — Other Ambulatory Visit: Payer: Self-pay

## 2017-08-24 MED ORDER — OMEPRAZOLE 20 MG PO CPDR: 20.0000 mg | DELAYED_RELEASE_CAPSULE | Freq: Every day | ORAL | 1 refills | Status: DC

## 2017-09-03 ENCOUNTER — Telehealth: Payer: Self-pay | Admitting: Family Medicine

## 2017-09-03 NOTE — Telephone Encounter (Signed)
See note

## 2017-09-03 NOTE — Telephone Encounter (Signed)
Happy to see him to discuss options. He could try once a week. I actually dont see recent documentation on this issue so it has been a while since we discussed- would need to go over further with him to get refreshed.

## 2017-09-03 NOTE — Telephone Encounter (Signed)
Copied from [place] [phone]. Topic: Quick Communication - See Telephone Encounter >> Sep 03, 2017  2:18 PM Synthia Innocent wrote: CRM for notification. See Telephone encounter for: 09/03/17. Patient states that katoconazole shampoo 2% makes his scalp and forehead oily.

## 2017-09-03 NOTE — Telephone Encounter (Signed)
Please advise 

## 2017-09-04 NOTE — Telephone Encounter (Signed)
Called patient to get him scheduled for an appointment, patient did not answer. I left a voicemail to call our office back.

## 2017-09-05 ENCOUNTER — Telehealth: Payer: Self-pay | Admitting: Family Medicine

## 2017-09-05 NOTE — Telephone Encounter (Signed)
Copied from [place] [id number]. Topic: Quick Communication - Rx Refill/Question >> Sep 05, 2017  1:44 PM Lennox Solders wrote: Medication: diltiazem 180 mg #90, glimepiride 1 mg,atorvastatin 80 mg, metformin 1000 mg, alendronate and matzim la 180 mg . Pt needs 90 day supply each  medication Has the patient contacted their pharmacy? no Preferred Pharmacy (with phone number or street name): [facility]   Agent: Please be advised that RX refills may take up to 3 business days. We ask that you follow-up with your pharmacy.

## 2017-09-05 NOTE — Telephone Encounter (Signed)
See note

## 2017-09-06 ENCOUNTER — Other Ambulatory Visit: Payer: Self-pay

## 2017-09-06 MED ORDER — METFORMIN HCL 1000 MG PO TABS: ORAL_TABLET | ORAL | 1 refills | Status: DC

## 2017-09-06 MED ORDER — DILTIAZEM HCL ER COATED BEADS 180 MG PO TB24: 180.0000 mg | ORAL_TABLET | Freq: Every day | ORAL | 2 refills | Status: DC

## 2017-09-06 MED ORDER — ATORVASTATIN CALCIUM 80 MG PO TABS: 80.0000 mg | ORAL_TABLET | Freq: Every day | ORAL | 1 refills | Status: DC

## 2017-09-06 MED ORDER — ALENDRONATE SODIUM 70 MG PO TABS: 70.0000 mg | ORAL_TABLET | ORAL | 11 refills | Status: DC

## 2017-09-06 NOTE — Telephone Encounter (Signed)
The following prescriptions were sent to the pharmacy:  Diltiazem, atorvastatin, metformin, and alendronate.   Glimpiride was discontinued on 08/22/17  Matzim is not on his med list or in his medication history

## 2017-09-13 ENCOUNTER — Ambulatory Visit (INDEPENDENT_AMBULATORY_CARE_PROVIDER_SITE_OTHER): Payer: Medicare Other | Admitting: Family Medicine

## 2017-09-13 ENCOUNTER — Encounter: Payer: Self-pay | Admitting: Family Medicine

## 2017-09-13 VITALS — BP 124/68 | HR 62 | Temp 98.3°F | Ht 63.5 in | Wt 161.6 lb

## 2017-09-13 DIAGNOSIS — L219 Seborrheic dermatitis, unspecified: Secondary | ICD-10-CM | POA: Diagnosis not present

## 2017-09-13 DIAGNOSIS — I10 Essential (primary) hypertension: Secondary | ICD-10-CM

## 2017-09-13 MED ORDER — KETOCONAZOLE 2 % EX SHAM
MEDICATED_SHAMPOO | CUTANEOUS | 3 refills | Status: DC
Start: 2017-09-13 — End: 2018-01-04

## 2017-09-13 NOTE — Assessment & Plan Note (Signed)
S: Greasy spots on head and itchy red spots on back of head. Gets better with the shampoo he thinks- he thinks it helps it with the greasiness.   Seborrheic dermatitis diagnosed in the past apparently.  He has tried Nizoral shampoos very recently and has not noted significant difference yet.  He has trialed several other shampoos. A/P: This  could be seborrheic dermatitis-he will try [facility] on the days he is not using Nizoral/ketoconazole. Could trial topical steroid

## 2017-09-13 NOTE — Patient Instructions (Signed)
Use ketoconazole/nizoral twice a week.   Use selsun blue on the other days  If this doesn't help the top of your head or back of your neck- give me a call and we can try a different type of shampoo or cream  Vital signs look great

## 2017-09-13 NOTE — Progress Notes (Signed)
Subjective:  Steven [name] is a 84 y.o. year old very pleasant male patient who presents for/with See problem oriente. d charting ROS-complains of greasy scalp, erythema and pruritus at bottom of hairline.  No recent falls.  No chest pain reported  Past Medical History-  Patient Active Problem List   Diagnosis Date Noted  . Falls frequently 08/21/2017    Priority: High  . Aortic root dilatation ([place]) 10/06/2016    Priority: High  . Osteoporosis 01/07/2015    Priority: High  . CAD, ARTERY BYPASS GRAFT 05/21/2009    Priority: High  . Carotid artery stenosis 04/20/2008    Priority: High  . Diabetes mellitus type II, controlled ([place]) 04/19/2007    Priority: High  . Anemia 09/03/2013    Priority: Medium  . Essential hypertension, benign 10/11/2012    Priority: Medium  . Hyperlipidemia 04/19/2007    Priority: Medium  . Seborrheic dermatitis 09/13/2017    Priority: Low  . Senile purpura ([place]) 08/21/2017    Priority: Low  . History of acute inferior wall MI 09/03/2015    Priority: Low  . Foot drop, left 08/23/2012    Priority: Low  . History of supraventricular tachycardia 01/25/2011    Priority: Low  . Hypogonadism in male 02/04/2010    Priority: Low  . History of colonic polyps 09/07/2009    Priority: Low  . GERD 04/19/2007    Priority: Low    Medications- reviewed and updated Current Outpatient Medications  Medication Sig Dispense Refill  . alendronate (FOSAMAX) 70 MG tablet Take 1 tablet (70 mg total) by mouth every 7 (seven) days. Take with a full glass of water on an empty stomach. 5 tablet 11  . aspirin 81 MG tablet Take 81 mg by mouth daily.    Marland Kitchen atorvastatin (LIPITOR) 80 MG tablet Take 1 tablet (80 mg total) by mouth daily. 90 tablet 1  . cycloSPORINE (RESTASIS) 0.05 % ophthalmic emulsion 1 drop 2 (two) times daily.    Marland Kitchen diltiazem (CARDIZEM LA) 180 MG 24 hr tablet Take 1 tablet (180 mg total) by mouth daily. 90 tablet 2  . glucose blood test strip Use to test your  blood sugar daily. E11.9 One touch Verio test strips 100 each 12  . ketoconazole (NIZORAL) 2 % shampoo APPLY 2 TIMES A WEEK 120 mL 3  . Lancets (ONETOUCH ULTRASOFT) lancets USE TO TEST BLOOD SUGARS DAILY. 100 each 3  . metFORMIN (GLUCOPHAGE) 1000 MG tablet TAKE 1 TABLET TWICE A DAY WITH MEALS 180 tablet 1  . metoprolol succinate (TOPROL-XL) 25 MG 24 hr tablet TAKE 1 TABLET DAILY 90 tablet 2  . omeprazole (PRILOSEC) 20 MG capsule Take 1 capsule (20 mg total) by mouth daily. 90 capsule 1  . ONETOUCH VERIO test strip USE TO TEST BLOOD SUGARS DAILY. 100 each 3   No current facility-administered medications for this visit.     Objective: BP 124/68 (BP Location: Left Arm, Patient Position: Sitting, Cuff Size: Large)   Pulse 62   Temp 98.3 F (36.8 C) (Oral)   Ht 5' 3.5" (1.613 m)   Wt 161 lb 9.6 oz (73.3 kg)   SpO2 98%   BMI 28.18 kg/m  Gen: NAD, resting comfortably CV: RRR no murmurs rubs or gallops Lungs: CTAB no crackles, wheeze, rhonchi Abdomen: soft/nontender/nondistended/normal bowel sounds. No rebound or guarding.  Ext: 1+ edema Skin: warm, dry,  Mild greasiness to scalp. In back of hairline- mild erythema with some excoriation.  Neuro:walks with walker  Assessment/Plan:  Seborrheic dermatitis S: Greasy spots on head and itchy red spots on back of head. Gets better with the shampoo he thinks- he thinks it helps it with the greasiness.   Seborrheic dermatitis diagnosed in the past apparently.  He has tried Nizoral shampoos very recently and has not noted significant difference yet.  He has trialed several other shampoos. A/P: This  could be seborrheic dermatitis-he will try [facility] on the days he is not using Nizoral/ketoconazole. Could trial topical steroid  Hypertension-controlled on metoprolol and diltiazem  Future Appointments  Date Time Provider [place]  10/08/2017  2:30 PM [facility] 1 [facility]  02/22/2018  1:30 PM Marin Olp, MD [facility]  [facility]   Meds ordered this encounter  Medications  . ketoconazole (NIZORAL) 2 % shampoo    Sig: APPLY 2 TIMES A WEEK    Dispense:  120 mL    Refill:  3    Return precautions advised.  [name], MD

## 2017-09-21 ENCOUNTER — Other Ambulatory Visit: Payer: Self-pay | Admitting: Family Medicine

## 2017-09-21 NOTE — Telephone Encounter (Signed)
Copied from [place] [id number]. Topic: Quick Communication - Rx Refill/Question >> Sep 21, 2017  8:58 AM Aurelio Brash B wrote: Medication:alendronate (FOSAMAX) 70 MG tablet metFORMIN (GLUCOPHAGE) 1000 MG tablet atorvastatin (LIPITOR) 80 MG tablet  Has the patient contacted their pharmacy? yes (Agent: If no, request that the patient contact the pharmacy for the refill.) (Agent: If yes, when and what did the pharmacy advise?)  Preferred Pharmacy (with phone number or street name): [facility] [place] [facility] [phone] (Phone) [phone] (Fax)      Agent: Please be advised that RX refills may take up to 3 business days. We ask that you follow-up with your pharmacy.

## 2017-09-21 NOTE — Telephone Encounter (Signed)
Refill requests for Fosamax, glucophage an lipitor.   It looks like these meds were sent in on 09/06/17.     I attempted to call the [place] and was on prolonged hold times 3 times so unable to confirm these Rxs.     [facility] [place] [address] [phone]

## 2017-09-21 NOTE — Telephone Encounter (Signed)
See note

## 2017-09-24 MED ORDER — METFORMIN HCL 1000 MG PO TABS: ORAL_TABLET | ORAL | 1 refills | Status: DC

## 2017-09-24 MED ORDER — ALENDRONATE SODIUM 70 MG PO TABS: 70.0000 mg | ORAL_TABLET | ORAL | 11 refills | Status: DC

## 2017-09-24 MED ORDER — ATORVASTATIN CALCIUM 80 MG PO TABS: 80.0000 mg | ORAL_TABLET | Freq: Every day | ORAL | 1 refills | Status: DC

## 2017-10-01 ENCOUNTER — Telehealth: Payer: Self-pay | Admitting: Family Medicine

## 2017-10-01 NOTE — Telephone Encounter (Signed)
Copied from [place] [id number]. Topic: Quick Communication - Rx Refill/Question >> Oct 01, 2017  3:36 PM Lysle Morales L, NT wrote: Medication:  diltiazem (CARDIZEM LA) 180 MG 24 hr tablet   Has the patient contacted their pharmacy? yes [id number] (Agent: If no, request that the patient contact the pharmacy for the refill. (Agent: If yes, when and what did the pharmacy advise?patient was told the pharmacy did receive the prescription  Preferred Pharmacy (with phone number or street name [facility] [place] [place] [facility] [phone] (Phone) [phone] (Fax)      Agent: Please be advised that RX refills may take up to 3 business days. We ask that you follow-up with your pharmacy.

## 2017-10-02 MED ORDER — DILTIAZEM HCL ER COATED BEADS 180 MG PO TB24: 180.0000 mg | ORAL_TABLET | Freq: Every day | ORAL | 2 refills | Status: DC

## 2017-10-02 NOTE — Telephone Encounter (Signed)
[facility] regarding refill of Cardizem.Spoke with Elmyra Ricks at [employer] who states that prescription was not on file and was not received. Prescription was noted to be sent on 09/06/17. Prescription will be resent to pharmacy.

## 2017-10-08 ENCOUNTER — Ambulatory Visit (HOSPITAL_COMMUNITY)
Admission: RE | Admit: 2017-10-08 | Discharge: 2017-10-08 | Disposition: A | Discharge status: Home / Self Care | Payer: Medicare Other | Source: Ambulatory Visit | Attending: Cardiology | Admitting: Cardiology

## 2017-10-08 DIAGNOSIS — I6523 Occlusion and stenosis of bilateral carotid arteries: Secondary | ICD-10-CM | POA: Diagnosis present

## 2017-10-10 ENCOUNTER — Other Ambulatory Visit: Payer: Self-pay | Admitting: *Deleted

## 2017-10-10 DIAGNOSIS — I6523 Occlusion and stenosis of bilateral carotid arteries: Secondary | ICD-10-CM

## 2017-11-05 ENCOUNTER — Telehealth: Payer: Self-pay | Admitting: *Deleted

## 2017-11-05 ENCOUNTER — Other Ambulatory Visit: Payer: Self-pay | Admitting: Family Medicine

## 2017-11-05 NOTE — Telephone Encounter (Signed)
Copied from [place] [phone]. Topic: Inquiry >> Mar 29, 2017  3:41 PM Arbutus Leas wrote: Patient was advised that he needs to schedule a bone density appointment. The patient has had a bone density performed in 2016. Although the patient filled out a medical records request, I can see the results in the patient's chart. The patient would like to know if he still needs to schedule a bone density scan. Please call patient to advise.  >> Mar 30, 2017  1:22 PM Arbutus Leas wrote: Patient's PCP has stated that he still needs the bone density scan. Patient will call back. Okay to schedule bone density or to transfer by phone to [place]. Okay to ask for Ellie.

## 2017-11-05 NOTE — Telephone Encounter (Signed)
Pt had Dexa done 03/2017. Report in chart.

## 2017-11-26 ENCOUNTER — Telehealth: Payer: Self-pay | Admitting: Family Medicine

## 2017-11-26 NOTE — Telephone Encounter (Signed)
I do not see where this medication has been sent in. Called and spoke with patient who says he received an email stating a medication was sent in for him from Dr. Yong Channel. I told him we have no record of this. He states he will call back

## 2017-11-26 NOTE — Telephone Encounter (Signed)
Copied from [place] [id number]. Topic: Quick Communication - See Telephone Encounter >> Nov 26, 2017  3:56 PM Antonieta Iba C wrote: CRM for notification. See Telephone encounter for: 11/26/17.  Pt says that he received a notification about a medication Matzimla 180-24 MG stating that PCP prescribed. Pt says that he is not aware of a medication add on. Pt would like to be sure that provider is aware. Please advise.     CB: [phone]

## 2017-12-12 ENCOUNTER — Ambulatory Visit (INDEPENDENT_AMBULATORY_CARE_PROVIDER_SITE_OTHER): Payer: Medicare Other | Admitting: Internal Medicine

## 2017-12-12 ENCOUNTER — Encounter: Payer: Self-pay | Admitting: Internal Medicine

## 2017-12-12 VITALS — BP 149/66 | HR 80 | Ht 68.0 in | Wt 158.0 lb

## 2017-12-12 DIAGNOSIS — I10 Essential (primary) hypertension: Secondary | ICD-10-CM | POA: Diagnosis not present

## 2017-12-12 DIAGNOSIS — I493 Ventricular premature depolarization: Secondary | ICD-10-CM | POA: Diagnosis not present

## 2017-12-12 DIAGNOSIS — Z951 Presence of aortocoronary bypass graft: Secondary | ICD-10-CM

## 2017-12-12 NOTE — Progress Notes (Signed)
OFFICE NOTE  Chief Complaint:  No complaint  Primary Care Physician: Marin Olp, MD  HPI:  Steven [name] is a pleasant 84 year old male with history of coronary artery disease and coronary artery bypass grafting in 2004 after an inferior MI. He also has had a stent. He hasn't has diabetes, neuropathy, dyslipidemia, GERD and foot drop on the left for which he wears a brace. He has been followed by Dr. Dorris Carnes at with our cardiology. He saw her last in June of 2013 but reported since she is not seeing patients regularly in the office that he wished to see a different cardiologist. He has not had a stress test and a number of years. Recently he describes an episode where he was in his kitchen and felt a sharp pain in his left chest. This caused him to immediately turn his head violently to the right and he felt dizzy and then passed out. He does not remember falling but did catch himself and was confused when he was found fairly shortly afterwards on the floor. He does report he had an episode similar to this about one year ago where he fell backwards but was caught and did not totally pass out. He denies his heart racing or any periods of awareness of dizziness or blurred vision prior to the event.  The chest pain was described as sharp and sudden in the left chest like he was "punched" in the left chest. He has had no further chest pain or events.  He underwent nuclear stress testing recently which showed patent grafts and a small fixed inferior defect consistent with prior inferior MI. EF is preserved.  [name] returns today for followup. He is feeling quite well. He has had no further syncopal events. He did have carotid Dopplers which showed moderate disease in the right and mild disease of the left. Is concerned about stroke.  I have assured him that that is unlikely this time and that we will continue to follow his carotid Dopplers closely. He has no chest pain complaints. Blood  pressure is well-controlled today.  09/03/2015  [name] was seen today in the office in follow-up. Overall he seems to be doing pretty well. His carotid artery disease seems stable. He reports some shortness of breath on exertion. This is not necessarily worse than it did previously been. His EKG today however does show prominent Q waves in 3 and aVF. He does have a history of inferior MI however there are some new lateral T-wave inversions. For some reason the Q waves were previously present. His last echocardiogram did show inferior hypokinesis and 2015 however LVEF was preserved. Blood pressure is top normal today but he says well controlled at home. He is on Lipitor for dyslipidemia.  10/06/2016  [name] was seen today in follow-up. Overall he seems to be doing well. He denies any worsening shortness of breath or chest pain. Fact he's "scared" that he's feeling so well. We've followed carotid Dopplers recently his study showed no significant interval change. He did have an echocardiogram last year which showed a small decrease in LV function do 50-55% but the aortic root was dilated to 4.4 cm. In 2015 the study and indicated his aortic root measured 3.8 cm. I discussed that with him today and the fact that it suggests that there is possibly aneurysmal dilatation or effacement of the aortic root, we would need to consider repeat imaging of this.   12/12/2017  Mr.  Forbush is seen today in follow-up.  He has no specific complaints.  Interestingly his EKG today shows frequent ventricular ectopy with either every other or every third beat noted to be a PVC.  He denies any chest pain or worsening shortness of breath.  He does have previous bypass grafts that are quite old, dating back to 2004.  Blood pressure is slightly elevated today 149/66.  PMHx:  Past Medical History:  Diagnosis Date  . Abnormality of gait 08/23/2012  . Anemia    hx of  . Arthritis 01-03-12   osteoarthritis-knees  . CAD  (coronary artery disease) 2004   s/p inferior wall Mi 2004 with PTCA/Stent; s/p CABG 2004  . Degenerative arthritis   . Diabetes mellitus    type II  . Foot drop, bilateral 08/23/2012  . GERD (gastroesophageal reflux disease)   . Heart disease   . History of kidney stones   . History of myocardial infarction   . Hyperlipidemia   . Myocardial infarction [facility]) 01-03-12   '04  . MYOCARDIAL INFARCTION, HX OF 04/19/2007   Qualifier: Diagnosis of  By: Scherrie Gerlach    . Neuropathy 01-03-12   bil. feet  . Pneumonia    hx of  . Polyneuropathy in diabetes(357.2) 08/23/2012  . Radiculopathy of lumbar region 08/23/2011   Followed by Dr. Vertell Limber. S/p surgery. Resolved after surgery.      Past Surgical History:  Procedure Laterality Date  . BACK SURGERY  2013  . CARDIAC CATHETERIZATION  01-03-12   '04  . CATARACT EXTRACTION Bilateral 2015  . COLONOSCOPY    . CORONARY ARTERY BYPASS GRAFT  2004   LIMA to LAD; SVG to ramus intermedius; SVG to PDA/PLSA  . LAMINECTOMY  09/08/2011   Procedure: LUMBAR LAMINECTOMY FOR TUMOR;  Surgeon: Erline Levine, MD;  Location: [facility];  Service: Neurosurgery;  Laterality: Left;  Left Thoracic twelve-Lumbar one transpedicular resection of epidural mass  . TONSILLECTOMY  as child  . TOTAL KNEE ARTHROPLASTY  01/15/2012   Procedure: TOTAL KNEE ARTHROPLASTY;  Surgeon: Gearlean Alf, MD;  Location: [facility];  Service: Orthopedics;  Laterality: Right;  . TOTAL KNEE ARTHROPLASTY Left 09/16/2012   Procedure: LEFT TOTAL KNEE ARTHROPLASTY;  Surgeon: Gearlean Alf, MD;  Location: [facility] ORS;  Service: Orthopedics;  Laterality: Left;    FAMHx:  Family History  Problem Relation Age of Onset  . Cirrhosis Mother        etiology unclear  . Hypertension Father   . Stroke Father   . Cancer Sister        breast  . Stroke Maternal Grandmother   . Heart disease Maternal Grandfather        MI  . Stroke Paternal Grandmother   . Stroke Paternal Grandfather   . Anesthesia problems  Neg Hx   . Hypotension Neg Hx   . Malignant hyperthermia Neg Hx   . Pseudochol deficiency Neg Hx     SOCHx:   reports that he has never smoked. He has never used smokeless tobacco. He reports that he does not drink alcohol or use drugs.  ALLERGIES:  No Active Allergies  ROS: Pertinent items noted in HPI and remainder of comprehensive ROS otherwise negative.  HOME MEDS: Current Outpatient Medications  Medication Sig Dispense Refill  . alendronate (FOSAMAX) 70 MG tablet Take 1 tablet (70 mg total) by mouth every 7 (seven) days. Take with a full glass of water on an empty stomach. 5 tablet 11  .  aspirin 81 MG tablet Take 81 mg by mouth daily.    Marland Kitchen atorvastatin (LIPITOR) 80 MG tablet Take 1 tablet (80 mg total) by mouth daily. 90 tablet 1  . cycloSPORINE (RESTASIS) 0.05 % ophthalmic emulsion 1 drop 2 (two) times daily.    Marland Kitchen diltiazem (CARDIZEM LA) 180 MG 24 hr tablet Take 1 tablet (180 mg total) by mouth daily. 90 tablet 2  . glucose blood test strip Use to test your blood sugar daily. E11.9 One touch Verio test strips 100 each 12  . ketoconazole (NIZORAL) 2 % shampoo APPLY 2 TIMES A WEEK 120 mL 3  . Lancets (ONETOUCH ULTRASOFT) lancets USE TO TEST BLOOD SUGARS DAILY. 100 each 3  . metFORMIN (GLUCOPHAGE) 1000 MG tablet TAKE 1 TABLET TWICE A DAY WITH MEALS 180 tablet 1  . metoprolol succinate (TOPROL-XL) 25 MG 24 hr tablet TAKE 1 TABLET DAILY 90 tablet 0  . omeprazole (PRILOSEC) 20 MG capsule Take 1 capsule (20 mg total) by mouth daily. 90 capsule 1  . ONETOUCH VERIO test strip USE TO TEST BLOOD SUGARS DAILY. 100 each 3   No current facility-administered medications for this visit.     LABS/IMAGING: No results found for this or any previous visit (from the past 48 hour(s)). No results found.  VITALS: There were no vitals taken for this visit.  EXAM: General appearance: alert and no distress Neck: no carotid bruit and no JVD Lungs: clear to auscultation bilaterally Heart:  regular rate and rhythm, S1, S2 normal and diastolic murmur: early diastolic 2/6, blowing at lower left sternal border Abdomen: soft, non-tender; bowel sounds normal; no masses,  no organomegaly Extremities: extremities normal, atraumatic, no cyanosis or edema Pulses: 2+ and symmetric Skin: Skin color, texture, turgor normal. No rashes or lesions Neurologic: Mental status: Alert, oriented, thought content appropriate, walks with a walker Psych: pleasant  EKG: Sinus rhythm with frequent PVCs at 80, poor R wave progression anteriorly-personally reviewed  ASSESSMENT: 1. Frequent PVCs (new finding) 2. History of syncope, likely vasovagal 3. Coronary artery disease status post CABG in 2004 4. History of inferior MI 5. Diabetes type 2 6. Peripheral neuropathy 7. HTN - controlled 8. Dyslipidemia 9. Low risk nuclear stress test 12/11/2012 10. Murmur 11. DOE - EF 50-55% (09/2015) 12. Dilated aortic root to 4.4 cm 13. Bilateral carotid artery disease  PLAN: 1.   Mr. Buss is asymptomatic, but noted to have frequent PVCs today on EKG.  This could represent ischemia.  He has not had this previously and his bypass grafts now are 82 years old.  Last ischemia evaluation was in 2014.  I recommend a repeat Lexiscan Myoview to rule out any ischemia and further testing is necessary.  He may need to further increase his beta-blocker.  Follow-up after his stress test.  Pixie Casino, MD, FACC, [place] Director of the [facility] Diplomate of the American Board of Clinical Lipidology Attending Cardiologist  Direct Dial: [phone]  Fax: [phone]  Website:  [email or web address]   Nadean Corwin Diara Chaudhari 12/12/2017, 2:26 PM

## 2017-12-12 NOTE — Patient Instructions (Addendum)
Your physician has requested that you have a lexiscan myoview. Please follow instruction sheet, as given. -- this is a stress test done in Dr. Lysbeth Penner office  Your physician recommends that you schedule a follow-up appointment after your stress test.

## 2017-12-13 ENCOUNTER — Encounter: Payer: Self-pay | Admitting: Internal Medicine

## 2017-12-13 DIAGNOSIS — Z951 Presence of aortocoronary bypass graft: Secondary | ICD-10-CM | POA: Insufficient documentation

## 2017-12-13 DIAGNOSIS — I493 Ventricular premature depolarization: Secondary | ICD-10-CM | POA: Insufficient documentation

## 2017-12-19 ENCOUNTER — Other Ambulatory Visit: Payer: Self-pay | Admitting: Family Medicine

## 2017-12-20 ENCOUNTER — Telehealth (HOSPITAL_COMMUNITY): Payer: Self-pay

## 2017-12-20 NOTE — Telephone Encounter (Signed)
Encounter complete. 

## 2017-12-21 ENCOUNTER — Telehealth (HOSPITAL_COMMUNITY): Payer: Self-pay

## 2017-12-21 NOTE — Telephone Encounter (Signed)
Encounter complete. 

## 2017-12-25 ENCOUNTER — Ambulatory Visit (HOSPITAL_COMMUNITY)
Admission: RE | Admit: 2017-12-25 | Payer: Medicare Other | Source: Ambulatory Visit | Attending: Internal Medicine | Admitting: Internal Medicine

## 2018-01-04 ENCOUNTER — Other Ambulatory Visit: Payer: Self-pay

## 2018-01-04 MED ORDER — KETOCONAZOLE 2 % EX SHAM: MEDICATED_SHAMPOO | CUTANEOUS | 3 refills | Status: DC

## 2018-01-16 ENCOUNTER — Ambulatory Visit: Payer: Medicare Other | Admitting: Internal Medicine

## 2018-01-21 ENCOUNTER — Telehealth: Payer: Self-pay | Admitting: Family Medicine

## 2018-01-21 NOTE — Telephone Encounter (Signed)
Patient called to inquire about new medication Rybelsus. Patient would like to know if it has been approved and if he can get it. Contact to advise.

## 2018-01-24 ENCOUNTER — Encounter: Payer: Self-pay | Admitting: Internal Medicine

## 2018-01-28 NOTE — Telephone Encounter (Signed)
I do not see any encounters/notes about this medication in patient's chart.  Last OV was 5/30.

## 2018-01-30 ENCOUNTER — Ambulatory Visit (INDEPENDENT_AMBULATORY_CARE_PROVIDER_SITE_OTHER): Payer: Medicare Other

## 2018-01-30 ENCOUNTER — Encounter: Payer: Self-pay | Admitting: Family Medicine

## 2018-01-30 ENCOUNTER — Other Ambulatory Visit: Payer: Self-pay

## 2018-01-30 DIAGNOSIS — Z23 Encounter for immunization: Secondary | ICD-10-CM

## 2018-01-30 MED ORDER — METOPROLOL SUCCINATE ER 25 MG PO TB24: 25.0000 mg | ORAL_TABLET | Freq: Every day | ORAL | 1 refills | Status: DC

## 2018-02-11 ENCOUNTER — Other Ambulatory Visit: Payer: Self-pay | Admitting: Family Medicine

## 2018-02-13 ENCOUNTER — Telehealth: Payer: Self-pay | Admitting: Family Medicine

## 2018-02-13 ENCOUNTER — Telehealth (HOSPITAL_COMMUNITY): Payer: Self-pay

## 2018-02-13 NOTE — Telephone Encounter (Signed)
Follow up  ° ° °Patient is returning call.  °

## 2018-02-13 NOTE — Telephone Encounter (Signed)
Encounter complete. 

## 2018-02-13 NOTE — Telephone Encounter (Signed)
Called patient and left a message asking for a return phone call 

## 2018-02-13 NOTE — Telephone Encounter (Signed)
See note  Copied from [place] [phone]. Topic: General - Inquiry >> Feb 13, 2018  1:42 PM Judyann Munson wrote: Reason for CRM: Patient is requesting a call back in regards a medications that are not listed.  Please advise

## 2018-02-15 ENCOUNTER — Ambulatory Visit (HOSPITAL_COMMUNITY)
Admission: RE | Admit: 2018-02-15 | Discharge: 2018-02-15 | Disposition: A | Discharge status: Home / Self Care | Payer: Medicare Other | Source: Ambulatory Visit | Attending: Cardiovascular Disease | Admitting: Cardiovascular Disease

## 2018-02-15 DIAGNOSIS — Z951 Presence of aortocoronary bypass graft: Secondary | ICD-10-CM

## 2018-02-15 DIAGNOSIS — I493 Ventricular premature depolarization: Secondary | ICD-10-CM | POA: Insufficient documentation

## 2018-02-15 LAB — MYOCARDIAL PERFUSION IMAGING
CHL CUP RESTING HR STRESS: 59 {beats}/min
CSEPPHR: 92 {beats}/min
LV sys vol: 78 mL
LVDIAVOL: 151 mL (ref 62–150)
SDS: 2
SRS: 22
SSS: 22
TID: 1.03

## 2018-02-15 MED ORDER — TECHNETIUM TC 99M TETROFOSMIN IV KIT
30.3000 | PACK | Freq: Once | INTRAVENOUS | Status: AC | PRN
  Administered 2018-02-15: 30.3 via INTRAVENOUS
  Filled 2018-02-15: qty 31

## 2018-02-15 MED ORDER — TECHNETIUM TC 99M TETROFOSMIN IV KIT
10.4000 | PACK | Freq: Once | INTRAVENOUS | Status: AC | PRN
  Administered 2018-02-15: 10.4 via INTRAVENOUS
  Filled 2018-02-15: qty 11

## 2018-02-15 MED ORDER — REGADENOSON 0.4 MG/5ML IV SOLN
0.4000 mg | Freq: Once | INTRAVENOUS | Status: AC
  Administered 2018-02-15: 0.4 mg via INTRAVENOUS

## 2018-02-21 ENCOUNTER — Ambulatory Visit: Payer: Medicare Other | Admitting: Family Medicine

## 2018-02-22 ENCOUNTER — Ambulatory Visit: Payer: Medicare Other | Admitting: Family Medicine

## 2018-02-26 ENCOUNTER — Encounter: Payer: Self-pay | Admitting: Family Medicine

## 2018-02-26 ENCOUNTER — Ambulatory Visit (INDEPENDENT_AMBULATORY_CARE_PROVIDER_SITE_OTHER): Payer: Medicare Other | Admitting: Family Medicine

## 2018-02-26 DIAGNOSIS — M810 Age-related osteoporosis without current pathological fracture: Secondary | ICD-10-CM

## 2018-02-26 DIAGNOSIS — L219 Seborrheic dermatitis, unspecified: Secondary | ICD-10-CM

## 2018-02-26 DIAGNOSIS — E785 Hyperlipidemia, unspecified: Secondary | ICD-10-CM

## 2018-02-26 DIAGNOSIS — E119 Type 2 diabetes mellitus without complications: Secondary | ICD-10-CM

## 2018-02-26 DIAGNOSIS — I10 Essential (primary) hypertension: Secondary | ICD-10-CM | POA: Diagnosis not present

## 2018-02-26 LAB — CBC
HEMATOCRIT: 34.3 % — AB (ref 39.0–52.0)
HEMOGLOBIN: 11.7 g/dL — AB (ref 13.0–17.0)
MCHC: 34 g/dL (ref 30.0–36.0)
MCV: 93.1 fl (ref 78.0–100.0)
Platelets: 257 10*3/uL (ref 150.0–400.0)
RBC: 3.68 Mil/uL — ABNORMAL LOW (ref 4.22–5.81)
RDW: 14.1 % (ref 11.5–15.5)
WBC: 7.1 10*3/uL (ref 4.0–10.5)

## 2018-02-26 LAB — COMPREHENSIVE METABOLIC PANEL
ALBUMIN: 4.1 g/dL (ref 3.5–5.2)
ALK PHOS: 64 U/L (ref 39–117)
ALT: 13 U/L (ref 0–53)
AST: 14 U/L (ref 0–37)
BILIRUBIN TOTAL: 0.4 mg/dL (ref 0.2–1.2)
BUN: 27 mg/dL — AB (ref 6–23)
CO2: 24 mEq/L (ref 19–32)
Calcium: 9.9 mg/dL (ref 8.4–10.5)
Chloride: 105 mEq/L (ref 96–112)
Creatinine, Ser: 1 mg/dL (ref 0.40–1.50)
GFR: 75.5 mL/min (ref >60.00)
Glucose, Bld: 102 mg/dL — ABNORMAL HIGH (ref 70–99)
POTASSIUM: 4.1 meq/L (ref 3.5–5.1)
SODIUM: 140 meq/L (ref 135–145)
TOTAL PROTEIN: 6.8 g/dL (ref 6.0–8.3)

## 2018-02-26 LAB — HEMOGLOBIN A1C: Hgb A1c MFr Bld: 5.9 % (ref 4.6–6.5)

## 2018-02-26 LAB — LDL CHOLESTEROL, DIRECT: LDL DIRECT: 74 mg/dL

## 2018-02-26 MED ORDER — GLIMEPIRIDE 1 MG PO TABS: 1.0000 mg | ORAL_TABLET | Freq: Every day | ORAL | 3 refills | Status: DC

## 2018-02-26 NOTE — Patient Instructions (Addendum)
he would like to continue glimepiride 1mg  in addition to metformin as long as no low lood sugar- will update a1c today - if remains 5.5 or below I would suggest him stopping glimepiride again.   Over the counter options:  You need to take at least 800 units vitamin D per day while on fosamax. Also need to get 1200 mg calcium in through diet and food choices.   Your blood pressure trend concerns me- last two visits has been above 140. I would like for you to buy/use a home cuff to check at least 3-4x a week. Your goal is <140/90. If you note in the next few weeks that it is higher than our goal, see me sooner. Otherwise, see me in 2-3 months for recheck. Bring your home cuff and your log of blood pressures with you to visit. Do not get a wrist cuff. I like omron product but im open to pharmacist suggestions as well.   If pressure ok tomorrow and ok at home- ok to see me in 4-6 months   Let us know if you need a new blood sugar meter- Our team can send one in for you   Please stop by lab before you go

## 2018-02-26 NOTE — Assessment & Plan Note (Signed)
S:  controlled on metformin 1000 mg twice a day last visit along with glimepiride 1 mg- Steven [name] actually continued taking these CBGs- denies low blood sugars.  Lab Results  Component Value Date   HGBA1C 5.5 08/21/2017   HGBA1C 6.0 04/03/2017   HGBA1C 6.0 09/26/2016  A/P: suspect stable- Steven [name] would like to continue glimepiride 1mg  in addition to metformin as long as no  Lows- will update a1c - if remains 5.5 or below I would suggest him stopping glimepiride again

## 2018-02-26 NOTE — Assessment & Plan Note (Signed)
S:Patient with history of greasy spots and itchy red spots on the back of his head.  He had been previously diagnosed with seborrheic dermatitis.  Last visit we tried [facility] on the days he is not using Nizoral/ketoconazole which is once a week.  We also discussed possibly trying topical steroid. Never had to use topical steroid- he reports significant improvement A/P: improved- continue current regimen of nizoral twice a week with selsun blue use din between.

## 2018-02-26 NOTE — Assessment & Plan Note (Signed)
S: Patient restarted Fosamax last December after bone density showed worsening bone density with right femoral neck at -3.0 T score.  He is compliant with calcium and vitamin D. A/P: hoping stable- some concern as may not have been on calcium/vitamin D. Update labs

## 2018-02-26 NOTE — Progress Notes (Signed)
Subjective:  Steven [name] is a 84 y.o. year old very pleasant male patient who presents for/with See problem oriented charting ROS- greasy spots on head improved. Feels off balance- no recent falls.  No hypoglycemia.  No reported chest pain.  Past Medical History-  Patient Active Problem List   Diagnosis Date Noted  . Falls frequently 08/21/2017    Priority: High  . Aortic root dilatation ([place]) 10/06/2016    Priority: High  . Osteoporosis 01/07/2015    Priority: High  . CAD, ARTERY BYPASS GRAFT 05/21/2009    Priority: High  . Carotid artery stenosis 04/20/2008    Priority: High  . Diabetes mellitus type II, controlled ([place]) 04/19/2007    Priority: High  . Anemia 09/03/2013    Priority: Medium  . Essential hypertension, benign 10/11/2012    Priority: Medium  . Hyperlipidemia 04/19/2007    Priority: Medium  . Seborrheic dermatitis 09/13/2017    Priority: Low  . Senile purpura ([place]) 08/21/2017    Priority: Low  . History of acute inferior wall MI 09/03/2015    Priority: Low  . Foot drop, left 08/23/2012    Priority: Low  . History of supraventricular tachycardia 01/25/2011    Priority: Low  . Hypogonadism in male 02/04/2010    Priority: Low  . History of colonic polyps 09/07/2009    Priority: Low  . GERD 04/19/2007    Priority: Low  . Hx of CABG 12/13/2017  . PVC's (premature ventricular contractions) 12/13/2017    Medications- reviewed and updated Current Outpatient Medications  Medication Sig Dispense Refill  . alendronate (FOSAMAX) 70 MG tablet Take 1 tablet (70 mg total) by mouth every 7 (seven) days. Take with a full glass of water on an empty stomach. 5 tablet 11  . aspirin 81 MG tablet Take 81 mg by mouth daily.    Marland Kitchen atorvastatin (LIPITOR) 80 MG tablet Take 1 tablet (80 mg total) by mouth daily. 90 tablet 1  . cycloSPORINE (RESTASIS) 0.05 % ophthalmic emulsion 1 drop 2 (two) times daily.    Marland Kitchen diltiazem (CARDIZEM LA) 180 MG 24 hr tablet Take 1 tablet (180 mg  total) by mouth daily. 90 tablet 2  . glucose blood test strip Use to test your blood sugar daily. E11.9 One touch Verio test strips 100 each 12  . ketoconazole (NIZORAL) 2 % shampoo APPLY 2 TIMES A WEEK 120 mL 3  . Lancets (ONETOUCH ULTRASOFT) lancets USE TO TEST BLOOD SUGARS DAILY. 100 each 3  . metFORMIN (GLUCOPHAGE) 1000 MG tablet TAKE 1 TABLET BY MOUTH TWICE A DAY WITH MEALS 180 tablet 3  . metoprolol succinate (TOPROL-XL) 25 MG 24 hr tablet Take 1 tablet (25 mg total) by mouth daily. 90 tablet 1  . omeprazole (PRILOSEC) 20 MG capsule TAKE 1 CAPSULE DAILY 90 capsule 1  . ONETOUCH VERIO test strip USE TO TEST BLOOD SUGARS DAILY. 100 each 3  . glimepiride (AMARYL) 1 MG tablet Take 1 tablet (1 mg total) by mouth daily with breakfast. 90 tablet 3   No current facility-administered medications for this visit.     Objective: BP (!) 142/80 (BP Location: Left Arm, Cuff Size: Normal)   Pulse 71   Temp 98.3 F (36.8 C) (Oral)   Ht 5\' 8"  (1.727 m)   Wt 161 lb (73 kg)   SpO2 99%   BMI 24.48 kg/m  Gen: NAD, resting comfortably CV: RRR diastolic murmur noted-follows with cardiology Lungs: CTAB no crackles, wheeze, rhonchi Abdomen: soft/nontender/nondistended Ext:  Trace edema Skin: warm, dry Neuro: Walks with walker, speech normal  Assessment/Plan:   Diabetes mellitus type II, controlled S:  controlled on metformin 1000 mg twice a day last visit along with glimepiride 1 mg- he actually continued taking these CBGs- denies low blood sugars.  Lab Results  Component Value Date   HGBA1C 5.5 08/21/2017   HGBA1C 6.0 04/03/2017   HGBA1C 6.0 09/26/2016  A/P: suspect stable- he would like to continue glimepiride 1mg  in addition to metformin as long as no  Lows- will update a1c - if remains 5.5 or below I would suggest him stopping glimepiride again  Hyperlipidemia S: Reasonably controlled on atorvastatin 80 mg with last LDL 67 in December last year A/P: stable- continue current  medication  Osteoporosis S: Patient restarted Fosamax last December after bone density showed worsening bone density with right femoral neck at -3.0 T score.  He is compliant with calcium and vitamin D. A/P: hoping stable- some concern as may not have been on calcium/vitamin D. Update labs  Seborrheic dermatitis S:Patient with history of greasy spots and itchy red spots on the back of his head.  He had been previously diagnosed with seborrheic dermatitis.  Last visit we tried [facility] on the days he is not using Nizoral/ketoconazole which is once a week.  We also discussed possibly trying topical steroid. Never had to use topical steroid- he reports significant improvement A/P: improved- continue current regimen of nizoral twice a week with selsun blue use din between.    Essential hypertension, benign S: Poorly controlled initially on metoprolol 25 mg extended release and diltiazem 180 mg extended release.  BP Readings from Last 3 Encounters:  02/26/18 (!) 142/80  12/12/17 (!) 149/66  09/13/17 124/68  A/P: We discussed blood pressure goal of <140/90. Continue current meds for now. Sees cardiology tomorrow and I also encouraged home monitoring- we may have to increase strength of regimen if high at home and cardiology tomorrow. Would like for him to reach out to me if home #s high  Future Appointments  Date Time Provider [place]  02/27/2018  2:45 PM Hilty, Nadean Corwin, MD [facility]   Advised 6 month follow up  Lab/Order associations: Controlled type 2 diabetes mellitus without complication, without long-term current use of insulin ([place]) - Plan: Hemoglobin A1c  Hyperlipidemia, unspecified hyperlipidemia type - Plan: CBC, Comprehensive metabolic panel, LDL cholesterol, direct  Age-related osteoporosis without current pathological fracture  Seborrheic dermatitis  Essential hypertension, benign  Meds ordered this encounter  Medications  . glimepiride (AMARYL) 1  MG tablet    Sig: Take 1 tablet (1 mg total) by mouth daily with breakfast.    Dispense:  90 tablet    Refill:  3    Return precautions advised.  [name], MD

## 2018-02-26 NOTE — Assessment & Plan Note (Signed)
S: Reasonably controlled on atorvastatin 80 mg with last LDL 67 in December last year A/P: stable- continue current medication

## 2018-02-26 NOTE — Assessment & Plan Note (Signed)
S: Poorly controlled initially on metoprolol 25 mg extended release and diltiazem 180 mg extended release.  BP Readings from Last 3 Encounters:  02/26/18 (!) 142/80  12/12/17 (!) 149/66  09/13/17 124/68  A/P: We discussed blood pressure goal of <140/90. Continue current meds for now. Sees cardiology tomorrow and I also encouraged home monitoring- we may have to increase strength of regimen if high at home and cardiology tomorrow. Would like for him to reach out to me if home #s high

## 2018-02-27 ENCOUNTER — Encounter: Payer: Self-pay | Admitting: Internal Medicine

## 2018-02-27 ENCOUNTER — Ambulatory Visit (INDEPENDENT_AMBULATORY_CARE_PROVIDER_SITE_OTHER): Payer: Medicare Other | Admitting: Internal Medicine

## 2018-02-27 VITALS — BP 170/77 | HR 69 | Ht 68.0 in | Wt 162.0 lb

## 2018-02-27 DIAGNOSIS — Z951 Presence of aortocoronary bypass graft: Secondary | ICD-10-CM

## 2018-02-27 DIAGNOSIS — I252 Old myocardial infarction: Secondary | ICD-10-CM | POA: Diagnosis not present

## 2018-02-27 DIAGNOSIS — I493 Ventricular premature depolarization: Secondary | ICD-10-CM | POA: Diagnosis not present

## 2018-02-27 MED ORDER — METOPROLOL SUCCINATE ER 50 MG PO TB24: 50.0000 mg | ORAL_TABLET | Freq: Every day | ORAL | 3 refills | Status: DC

## 2018-02-27 NOTE — Patient Instructions (Signed)
Medication Instructions:  INCREASE metoprolol succinate (toprol XL) to 50mg  daily -- you can take 2 of your 25mg  tablets until you run out -- new prescription is for 1 - 50mg  tablet daily If you need a refill on your cardiac medications before your next appointment, please call your pharmacy.   Follow-Up: At [facility], you and your health needs are our priority.  As part of our continuing mission to provide you with exceptional heart care, we have created designated Provider Care Teams.  These Care Teams include your primary Cardiologist (physician) and Advanced Practice Providers (APPs -  Physician Assistants and Nurse Practitioners) who all work together to provide you with the care you need, when you need it. You will need a follow up appointment in 6 months.  Please call our office 2 months in advance to schedule this appointment.  You may see Dr. Debara Pickett or one of the following Advanced Practice Providers on your designated Care Team: Almyra Deforest, [place] . Fabian Sharp, PA-C  Any Other Special Instructions Will Be Listed Below (If Applicable).

## 2018-02-27 NOTE — Progress Notes (Signed)
OFFICE NOTE  Chief Complaint:  Follow-up stress test  Primary Care Physician: Marin Olp, MD  HPI:  Steven [name] is a pleasant 84 year old male with history of coronary artery disease and coronary artery bypass grafting in 2004 after an inferior MI. He also has had a stent. He hasn't has diabetes, neuropathy, dyslipidemia, GERD and foot drop on the left for which he wears a brace. He has been followed by Dr. Dorris Carnes at with our cardiology. He saw her last in June of 2013 but reported since she is not seeing patients regularly in the office that he wished to see a different cardiologist. He has not had a stress test and a number of years. Recently he describes an episode where he was in his kitchen and felt a sharp pain in his left chest. This caused him to immediately turn his head violently to the right and he felt dizzy and then passed out. He does not remember falling but did catch himself and was confused when he was found fairly shortly afterwards on the floor. He does report he had an episode similar to this about one year ago where he fell backwards but was caught and did not totally pass out. He denies his heart racing or any periods of awareness of dizziness or blurred vision prior to the event.  The chest pain was described as sharp and sudden in the left chest like he was "punched" in the left chest. He has had no further chest pain or events.  He underwent nuclear stress testing recently which showed patent grafts and a small fixed inferior defect consistent with prior inferior MI. EF is preserved.  Steven [name] returns today for followup. He is feeling quite well. He has had no further syncopal events. He did have carotid Dopplers which showed moderate disease in the right and mild disease of the left. Is concerned about stroke.  I have assured him that that is unlikely this time and that we will continue to follow his carotid Dopplers closely. He has no chest pain  complaints. Blood pressure is well-controlled today.  09/03/2015  Steven [name] was seen today in the office in follow-up. Overall he seems to be doing pretty well. His carotid artery disease seems stable. He reports some shortness of breath on exertion. This is not necessarily worse than it did previously been. His EKG today however does show prominent Q waves in 3 and aVF. He does have a history of inferior MI however there are some new lateral T-wave inversions. For some reason the Q waves were previously present. His last echocardiogram did show inferior hypokinesis and 2015 however LVEF was preserved. Blood pressure is top normal today but he says well controlled at home. He is on Lipitor for dyslipidemia.  10/06/2016  Steven [name] was seen today in follow-up. Overall he seems to be doing well. He denies any worsening shortness of breath or chest pain. Fact he's "scared" that he's feeling so well. We've followed carotid Dopplers recently his study showed no significant interval change. He did have an echocardiogram last year which showed a small decrease in LV function do 50-55% but the aortic root was dilated to 4.4 cm. In 2015 the study and indicated his aortic root measured 3.8 cm. I discussed that with him today and the fact that it suggests that there is possibly aneurysmal dilatation or effacement of the aortic root, we would need to consider repeat imaging of this.   12/12/2017  Steven [name] is seen today in follow-up.  He has no specific complaints.  Interestingly his EKG today shows frequent ventricular ectopy with either every other or every third beat noted to be a PVC.  He denies any chest pain or worsening shortness of breath.  He does have previous bypass grafts that are quite old, dating back to 2004.  Blood pressure is slightly elevated today 149/66.  02/27/2018  Steven [name] returns today for follow-up of his stress test.  He underwent Lexiscan Myoview stress testing on February 15, 2018 which  showed a fixed inferior infarct and LVEF of 48% with inferior hypokinesis.  This consistent with prior studies he has had an known inferior MI in 2004.  It was no new reversible ischemia.  Blood pressure is elevated today 170/77.  Based on these findings the fact that there might be a mild decrease in LV function, I think there is room to increase his beta-blocker further.  PMHx:  Past Medical History:  Diagnosis Date  . Abnormality of gait 08/23/2012  . Anemia    hx of  . Arthritis 01-03-12   osteoarthritis-knees  . CAD (coronary artery disease) 2004   s/p inferior wall Mi 2004 with PTCA/Stent; s/p CABG 2004  . Degenerative arthritis   . Diabetes mellitus    type II  . Foot drop, bilateral 08/23/2012  . GERD (gastroesophageal reflux disease)   . Heart disease   . History of kidney stones   . History of myocardial infarction   . Hyperlipidemia   . Myocardial infarction [facility]) 01-03-12   '04  . MYOCARDIAL INFARCTION, HX OF 04/19/2007   Qualifier: Diagnosis of  By: Scherrie Gerlach    . Neuropathy 01-03-12   bil. feet  . Pneumonia    hx of  . Polyneuropathy in diabetes(357.2) 08/23/2012  . Radiculopathy of lumbar region 08/23/2011   Followed by Dr. Vertell Limber. S/p surgery. Resolved after surgery.      Past Surgical History:  Procedure Laterality Date  . BACK SURGERY  2013  . CARDIAC CATHETERIZATION  01-03-12   '04  . CATARACT EXTRACTION Bilateral 2015  . COLONOSCOPY    . CORONARY ARTERY BYPASS GRAFT  2004   LIMA to LAD; SVG to ramus intermedius; SVG to PDA/PLSA  . LAMINECTOMY  09/08/2011   Procedure: LUMBAR LAMINECTOMY FOR TUMOR;  Surgeon: Erline Levine, MD;  Location: [facility];  Service: Neurosurgery;  Laterality: Left;  Left Thoracic twelve-Lumbar one transpedicular resection of epidural mass  . TONSILLECTOMY  as child  . TOTAL KNEE ARTHROPLASTY  01/15/2012   Procedure: TOTAL KNEE ARTHROPLASTY;  Surgeon: Gearlean Alf, MD;  Location: [facility];  Service: Orthopedics;  Laterality: Right;  .  TOTAL KNEE ARTHROPLASTY Left 09/16/2012   Procedure: LEFT TOTAL KNEE ARTHROPLASTY;  Surgeon: Gearlean Alf, MD;  Location: [facility];  Service: Orthopedics;  Laterality: Left;    FAMHx:  Family History  Problem Relation Age of Onset  . Cirrhosis Mother        etiology unclear  . Hypertension Father   . Stroke Father   . Cancer Sister        breast  . Stroke Maternal Grandmother   . Heart disease Maternal Grandfather        MI  . Stroke Paternal Grandmother   . Stroke Paternal Grandfather   . Anesthesia problems Neg Hx   . Hypotension Neg Hx   . Malignant hyperthermia Neg Hx   . Pseudochol deficiency Neg Hx  SOCHx:   reports that he has never smoked. He has never used smokeless tobacco. He reports that he does not drink alcohol or use drugs.  ALLERGIES:  No Active Allergies  ROS: Pertinent items noted in HPI and remainder of comprehensive ROS otherwise negative.  HOME MEDS: Current Outpatient Medications  Medication Sig Dispense Refill  . alendronate (FOSAMAX) 70 MG tablet Take 1 tablet (70 mg total) by mouth every 7 (seven) days. Take with a full glass of water on an empty stomach. 5 tablet 11  . aspirin 81 MG tablet Take 81 mg by mouth daily.    Marland Kitchen atorvastatin (LIPITOR) 80 MG tablet Take 1 tablet (80 mg total) by mouth daily. 90 tablet 1  . cycloSPORINE (RESTASIS) 0.05 % ophthalmic emulsion 1 drop 2 (two) times daily.    Marland Kitchen diltiazem (CARDIZEM LA) 180 MG 24 hr tablet Take 1 tablet (180 mg total) by mouth daily. 90 tablet 2  . glimepiride (AMARYL) 1 MG tablet Take 1 tablet (1 mg total) by mouth daily with breakfast. 90 tablet 3  . glucose blood test strip Use to test your blood sugar daily. E11.9 One touch Verio test strips 100 each 12  . ketoconazole (NIZORAL) 2 % shampoo APPLY 2 TIMES A WEEK 120 mL 3  . Lancets (ONETOUCH ULTRASOFT) lancets USE TO TEST BLOOD SUGARS DAILY. 100 each 3  . metFORMIN (GLUCOPHAGE) 1000 MG tablet TAKE 1 TABLET BY MOUTH TWICE A DAY WITH MEALS 180  tablet 3  . metoprolol succinate (TOPROL-XL) 25 MG 24 hr tablet Take 1 tablet (25 mg total) by mouth daily. 90 tablet 1  . omeprazole (PRILOSEC) 20 MG capsule TAKE 1 CAPSULE DAILY 90 capsule 1  . ONETOUCH VERIO test strip USE TO TEST BLOOD SUGARS DAILY. 100 each 3   No current facility-administered medications for this visit.     LABS/IMAGING: Results for orders placed or performed in visit on 02/26/18 (from the past 48 hour(s))  CBC     Status: Abnormal   Collection Time: 02/26/18  2:24 PM  Result Value Ref Range   WBC 7.1 4.0 - 10.5 K/uL   RBC 3.68 (L) 4.22 - 5.81 Mil/uL   Platelets 257.0 150.0 - 400.0 K/uL   Hemoglobin 11.7 (L) 13.0 - 17.0 g/dL   HCT 34.3 (L) 39.0 - 52.0 %   MCV 93.1 78.0 - 100.0 fl   MCHC 34.0 30.0 - 36.0 g/dL   RDW 14.1 11.5 - 15.5 %  Comprehensive metabolic panel     Status: Abnormal   Collection Time: 02/26/18  2:24 PM  Result Value Ref Range   Sodium 140 135 - 145 mEq/L   Potassium 4.1 3.5 - 5.1 mEq/L   Chloride 105 96 - 112 mEq/L   CO2 24 19 - 32 mEq/L   Glucose, Bld 102 (H) 70 - 99 mg/dL   BUN 27 (H) 6 - 23 mg/dL   Creatinine, Ser 1.00 0.40 - 1.50 mg/dL   Total Bilirubin 0.4 0.2 - 1.2 mg/dL   Alkaline Phosphatase 64 39 - 117 U/L   AST 14 0 - 37 U/L   ALT 13 0 - 53 U/L   Total Protein 6.8 6.0 - 8.3 g/dL   Albumin 4.1 3.5 - 5.2 g/dL   Calcium 9.9 8.4 - 10.5 mg/dL   GFR 75.50 >60.00 mL/min  LDL cholesterol, direct     Status: None   Collection Time: 02/26/18  2:24 PM  Result Value Ref Range   Direct LDL 74.0 mg/dL  Comment: Optimal:  <100 mg/dLNear or Above Optimal:  100-129 mg/dLBorderline High:  130-159 mg/dLHigh:  160-189 mg/dLVery High:  >190 mg/dL  Hemoglobin A1c     Status: None   Collection Time: 02/26/18  2:24 PM  Result Value Ref Range   Hgb A1c MFr Bld 5.9 4.6 - 6.5 %    Comment: Glycemic Control Guidelines for People with Diabetes:Non Diabetic:  <6%Goal of Therapy: <7%Additional Action Suggested:  >8%    No results  found.  VITALS: BP (!) 170/77   Pulse 69   Ht 5\' 8"  (1.727 m)   Wt 162 lb (73.5 kg)   SpO2 98%   BMI 24.63 kg/m   EXAM: Deferred  EKG: Deferred  ASSESSMENT: 1. Frequent PVCs (new finding) -low risk Myoview stress test with inferior scar and LVEF 48% (02/2018) 2. History of syncope, likely vasovagal 3. Coronary artery disease status post CABG in 2004 4. History of inferior MI 5. Diabetes type 2 6. Peripheral neuropathy 7. HTN - controlled 8. Dyslipidemia 9. Low risk nuclear stress test 12/11/2012 10. Murmur 11. DOE - EF 50-55% (09/2015) 12. Dilated aortic root to 4.4 cm 13. Bilateral carotid artery disease  PLAN: 1.   Steven [name] had a low risk stress test which showed an inferior scar consistent with his prior inferior MI and mildly reduced LVEF of 48%.  This is probably not significantly changed however since he is had more recent PVCs and blood pressure is elevated I would recommend an increase in his Toprol-XL from 50 to 75 mg daily.  Plan follow-up with me in 6 months.  Pixie Casino, MD, [facility], [place] Director of the Advanced Lipid Disorders &  Cardiovascular Risk Reduction Clinic Diplomate of the American Board of Clinical Lipidology Attending Cardiologist  Direct Dial: [phone]  Fax: [phone]  Website:  [email or web address] [email or web address] 02/27/2018, 3:10 PM

## 2018-03-11 ENCOUNTER — Telehealth: Payer: Self-pay | Admitting: Family Medicine

## 2018-03-11 NOTE — Telephone Encounter (Signed)
Copied from [place] [id number]. Topic: Quick Communication - See Telephone Encounter >> Mar 11, 2018  9:36 AM Blase Mess A wrote: CRM for notification. See Telephone encounter for: 03/11/18.  Patient is calling to let Dr. Yong Channel know that he will drop off his form for Dr. Ansel Bong approval for his handicap sticker. Thank you

## 2018-03-12 NOTE — Telephone Encounter (Signed)
noted 

## 2018-05-30 ENCOUNTER — Other Ambulatory Visit: Payer: Self-pay

## 2018-05-30 MED ORDER — OMEPRAZOLE 20 MG PO CPDR
20.0000 mg | DELAYED_RELEASE_CAPSULE | Freq: Every day | ORAL | 1 refills | Status: DC
Start: 2018-05-30 — End: 2018-06-04

## 2018-06-02 ENCOUNTER — Other Ambulatory Visit: Payer: Self-pay | Admitting: Family Medicine

## 2018-06-04 ENCOUNTER — Other Ambulatory Visit: Payer: Self-pay | Admitting: Family Medicine

## 2018-06-04 MED ORDER — OMEPRAZOLE 20 MG PO CPDR: 20.0000 mg | DELAYED_RELEASE_CAPSULE | Freq: Every day | ORAL | 3 refills | Status: DC

## 2018-06-23 ENCOUNTER — Other Ambulatory Visit: Payer: Self-pay | Admitting: Family Medicine

## 2018-07-02 LAB — HM DIABETES EYE EXAM

## 2018-08-17 ENCOUNTER — Other Ambulatory Visit: Payer: Self-pay | Admitting: Family Medicine

## 2018-08-19 ENCOUNTER — Telehealth: Payer: Self-pay | Admitting: *Deleted

## 2018-08-19 NOTE — Telephone Encounter (Signed)
Called pt but no answer. Sound like phone number may be a fax number. Rx filled for 30 days. Pt needs a f/u visit.

## 2018-08-19 NOTE — Telephone Encounter (Signed)
08/19/18 NO ANSWER/BUSY @10 :53 am.

## 2018-09-06 ENCOUNTER — Other Ambulatory Visit: Payer: Self-pay | Admitting: Family Medicine

## 2018-09-10 NOTE — Telephone Encounter (Signed)
Left message to return phone call.

## 2018-09-10 NOTE — Telephone Encounter (Signed)
Patient need to schedule an ov for more refills. Last ov 02/2018

## 2018-09-12 NOTE — Telephone Encounter (Signed)
Called pt no answer. Rx will be refilled for 30 days. Pt needs an ov for further refills.

## 2018-10-05 ENCOUNTER — Other Ambulatory Visit: Payer: Self-pay | Admitting: Family Medicine

## 2018-10-10 ENCOUNTER — Ambulatory Visit (HOSPITAL_COMMUNITY)
Admission: RE | Admit: 2018-10-10 | Discharge: 2018-10-10 | Disposition: A | Discharge status: Home / Self Care | Payer: Medicare Other | Source: Ambulatory Visit | Attending: Internal Medicine | Admitting: Internal Medicine

## 2018-10-10 ENCOUNTER — Other Ambulatory Visit (HOSPITAL_COMMUNITY): Payer: Self-pay | Admitting: Internal Medicine

## 2018-10-10 ENCOUNTER — Other Ambulatory Visit: Payer: Self-pay

## 2018-10-10 DIAGNOSIS — I6523 Occlusion and stenosis of bilateral carotid arteries: Secondary | ICD-10-CM | POA: Diagnosis not present

## 2018-10-14 ENCOUNTER — Other Ambulatory Visit: Payer: Self-pay | Admitting: Family Medicine

## 2018-10-15 NOTE — Telephone Encounter (Signed)
Last OV 02/26/2018 Last refill  Atorvastatin 06/24/18 #90/1                 Metformin 08/19/18 #60/0                 Matzim 09/12/18 #30/0 Next OV not scheduled  Pt needs OV.

## 2018-10-16 ENCOUNTER — Telehealth: Payer: Self-pay | Admitting: *Deleted

## 2018-10-16 NOTE — Telephone Encounter (Signed)
I have created a crm to notify the front office to schedule once patient calls back.

## 2018-10-16 NOTE — Telephone Encounter (Signed)
Called pt and left VM to call the office. Please schedule for visit when pt returns call.

## 2018-10-16 NOTE — Telephone Encounter (Signed)
Spoke with wife on 10/15/18 to schedule 6 mos f/u appointment with Dr. Debara Pickett.  She requested I call back and speak with Mr. Reichart.  Called back 10/16/18 and left message for Mr. To call and schedule his 6 months f/u appointment with Dr. Debara Pickett.  Patient prefers in office visit per 10/14/18 staff message from Sheral Apley, RN

## 2018-10-28 ENCOUNTER — Other Ambulatory Visit: Payer: Self-pay

## 2018-10-28 ENCOUNTER — Encounter: Payer: Self-pay | Admitting: Internal Medicine

## 2018-10-28 ENCOUNTER — Ambulatory Visit (INDEPENDENT_AMBULATORY_CARE_PROVIDER_SITE_OTHER): Payer: Medicare Other | Admitting: Internal Medicine

## 2018-10-28 VITALS — BP 148/82 | HR 70 | Ht 67.5 in | Wt 165.8 lb

## 2018-10-28 DIAGNOSIS — I493 Ventricular premature depolarization: Secondary | ICD-10-CM

## 2018-10-28 DIAGNOSIS — I10 Essential (primary) hypertension: Secondary | ICD-10-CM | POA: Diagnosis not present

## 2018-10-28 DIAGNOSIS — I6523 Occlusion and stenosis of bilateral carotid arteries: Secondary | ICD-10-CM | POA: Diagnosis not present

## 2018-10-28 DIAGNOSIS — E782 Mixed hyperlipidemia: Secondary | ICD-10-CM

## 2018-10-28 DIAGNOSIS — Z951 Presence of aortocoronary bypass graft: Secondary | ICD-10-CM | POA: Diagnosis not present

## 2018-10-28 NOTE — Patient Instructions (Addendum)
Medication Instructions:  Your physician recommends that you continue on your current medications as directed. Please refer to the Current Medication list given to you today.  If you need a refill on your cardiac medications before your next appointment, please call your pharmacy.   Follow-Up: At [facility], you and your health needs are our priority.  As part of our continuing mission to provide you with exceptional heart care, we have created designated Provider Care Teams.  These Care Teams include your primary Cardiologist (physician) and Advanced Practice Providers (APPs -  Physician Assistants and Nurse Practitioners) who all work together to provide you with the care you need, when you need it. You will need a follow up appointment in 6 months.  Please call our office 2 months in advance to schedule this appointment.  You may see Dr. Hilty or one of the following Advanced Practice Providers on your designated Care Team: Hao Meng, PA-C . Angela Duke, PA-C    

## 2018-10-30 ENCOUNTER — Encounter: Payer: Self-pay | Admitting: Internal Medicine

## 2018-10-30 NOTE — Progress Notes (Signed)
OFFICE NOTE  Chief Complaint:  No compliants  Primary Care Physician: Marin Olp, MD  HPI:  Steven [name] is a pleasant 85 year old male with history of coronary artery disease and coronary artery bypass grafting in 2004 after an inferior MI. He also has had a stent. He hasn't has diabetes, neuropathy, dyslipidemia, GERD and foot drop on the left for which he wears a brace. He has been followed by Dr. Dorris Carnes at with our cardiology. He saw her last in June of 2013 but reported since she is not seeing patients regularly in the office that he wished to see a different cardiologist. He has not had a stress test and a number of years. Recently he describes an episode where he was in his kitchen and felt a sharp pain in his left chest. This caused him to immediately turn his head violently to the right and he felt dizzy and then passed out. He does not remember falling but did catch himself and was confused when he was found fairly shortly afterwards on the floor. He does report he had an episode similar to this about one year ago where he fell backwards but was caught and did not totally pass out. He denies his heart racing or any periods of awareness of dizziness or blurred vision prior to the event.  The chest pain was described as sharp and sudden in the left chest like he was "punched" in the left chest. He has had no further chest pain or events.  He underwent nuclear stress testing recently which showed patent grafts and a small fixed inferior defect consistent with prior inferior MI. EF is preserved.  [name] returns today for followup. He is feeling quite well. He has had no further syncopal events. He did have carotid Dopplers which showed moderate disease in the right and mild disease of the left. Is concerned about stroke.  I have assured him that that is unlikely this time and that we will continue to follow his carotid Dopplers closely. He has no chest pain complaints. Blood  pressure is well-controlled today.  09/03/2015  [name] was seen today in the office in follow-up. Overall he seems to be doing pretty well. His carotid artery disease seems stable. He reports some shortness of breath on exertion. This is not necessarily worse than it did previously been. His EKG today however does show prominent Q waves in 3 and aVF. He does have a history of inferior MI however there are some new lateral T-wave inversions. For some reason the Q waves were previously present. His last echocardiogram did show inferior hypokinesis and 2015 however LVEF was preserved. Blood pressure is top normal today but he says well controlled at home. He is on Lipitor for dyslipidemia.  10/06/2016  [name] was seen today in follow-up. Overall he seems to be doing well. He denies any worsening shortness of breath or chest pain. Fact he's "scared" that he's feeling so well. We've followed carotid Dopplers recently his study showed no significant interval change. He did have an echocardiogram last year which showed a small decrease in LV function do 50-55% but the aortic root was dilated to 4.4 cm. In 2015 the study and indicated his aortic root measured 3.8 cm. I discussed that with him today and the fact that it suggests that there is possibly aneurysmal dilatation or effacement of the aortic root, we would need to consider repeat imaging of this.   12/12/2017  Mr.  See is seen today in follow-up.  He has no specific complaints.  Interestingly his EKG today shows frequent ventricular ectopy with either every other or every third beat noted to be a PVC.  He denies any chest pain or worsening shortness of breath.  He does have previous bypass grafts that are quite old, dating back to 2004.  Blood pressure is slightly elevated today 149/66.  02/27/2018  Mr. Hamblin returns today for follow-up of his stress test.  He underwent Lexiscan Myoview stress testing on February 15, 2018 which showed a fixed  inferior infarct and LVEF of 48% with inferior hypokinesis.  This consistent with prior studies he has had an known inferior MI in 2004.  It was no new reversible ischemia.  Blood pressure is elevated today 170/77.  Based on these findings the fact that there might be a mild decrease in LV function, I think there is room to increase his beta-blocker further.  10/28/2018  Mr. Sherrin returns today for follow-up.  He denies any chest pain or worsening shortness of breath.  Blood pressure is similar to numbers in the past which is mildly elevated however he says it is lower at home.  His weight has been fairly stable.  His only really struggle is with some arthritis and getting around.  He said he did have a fall the other day where he has some soreness over his right hip region.  He had labs about 8 months ago which showed an LDL of 74 and hemoglobin A1c of 5.9.  PMHx:  Past Medical History:  Diagnosis Date  . Abnormality of gait 08/23/2012  . Anemia    hx of  . Arthritis 01-03-12   osteoarthritis-knees  . CAD (coronary artery disease) 2004   s/p inferior wall Mi 2004 with PTCA/Stent; s/p CABG 2004  . Degenerative arthritis   . Diabetes mellitus    type II  . Foot drop, bilateral 08/23/2012  . GERD (gastroesophageal reflux disease)   . Heart disease   . History of kidney stones   . History of myocardial infarction   . Hyperlipidemia   . Myocardial infarction [facility]) 01-03-12   '04  . MYOCARDIAL INFARCTION, HX OF 04/19/2007   Qualifier: Diagnosis of  By: Scherrie Gerlach    . Neuropathy 01-03-12   bil. feet  . Pneumonia    hx of  . Polyneuropathy in diabetes(357.2) 08/23/2012  . Radiculopathy of lumbar region 08/23/2011   Followed by Dr. Vertell Limber. S/p surgery. Resolved after surgery.      Past Surgical History:  Procedure Laterality Date  . BACK SURGERY  2013  . CARDIAC CATHETERIZATION  01-03-12   '04  . CATARACT EXTRACTION Bilateral 2015  . COLONOSCOPY    . CORONARY ARTERY BYPASS GRAFT  2004    LIMA to LAD; SVG to ramus intermedius; SVG to PDA/PLSA  . LAMINECTOMY  09/08/2011   Procedure: LUMBAR LAMINECTOMY FOR TUMOR;  Surgeon: Erline Levine, MD;  Location: [place] [facility];  Service: Neurosurgery;  Laterality: Left;  Left Thoracic twelve-Lumbar one transpedicular resection of epidural mass  . TONSILLECTOMY  as child  . TOTAL KNEE ARTHROPLASTY  01/15/2012   Procedure: TOTAL KNEE ARTHROPLASTY;  Surgeon: Gearlean Alf, MD;  Location: [facility];  Service: Orthopedics;  Laterality: Right;  . TOTAL KNEE ARTHROPLASTY Left 09/16/2012   Procedure: LEFT TOTAL KNEE ARTHROPLASTY;  Surgeon: Gearlean Alf, MD;  Location: [facility];  Service: Orthopedics;  Laterality: Left;    FAMHx:  Family History  Problem Relation Age of Onset  . Cirrhosis Mother        etiology unclear  . Hypertension Father   . Stroke Father   . Cancer Sister        breast  . Stroke Maternal Grandmother   . Heart disease Maternal Grandfather        MI  . Stroke Paternal Grandmother   . Stroke Paternal Grandfather   . Anesthesia problems Neg Hx   . Hypotension Neg Hx   . Malignant hyperthermia Neg Hx   . Pseudochol deficiency Neg Hx     SOCHx:   reports that he has never smoked. He has never used smokeless tobacco. He reports that he does not drink alcohol or use drugs.  ALLERGIES:  No Active Allergies  ROS: Pertinent items noted in HPI and remainder of comprehensive ROS otherwise negative.  HOME MEDS: Current Outpatient Medications  Medication Sig Dispense Refill  . alendronate (FOSAMAX) 70 MG tablet TAKE 1 TABLET BY MOUTH EVERY 7 DAYS WITH A FULL GLASS OF WATER ON AN EMPTY STOMACH 12 tablet 4  . aspirin 81 MG tablet Take 81 mg by mouth daily.    Marland Kitchen atorvastatin (LIPITOR) 80 MG tablet TAKE 1 TABLET DAILY. 30 tablet 0  . cycloSPORINE (RESTASIS) 0.05 % ophthalmic emulsion 1 drop 2 (two) times daily.    Marland Kitchen glimepiride (AMARYL) 1 MG tablet Take 1 tablet (1 mg total) by mouth daily with breakfast. 90 tablet 3  . glucose  blood test strip Use to test your blood sugar daily. E11.9 One touch Verio test strips 100 each 12  . ketoconazole (NIZORAL) 2 % shampoo APPLY 2 TIMES A WEEK 120 mL 3  . Lancets (ONETOUCH ULTRASOFT) lancets USE TO TEST BLOOD SUGARS DAILY. 100 each 3  . MATZIM LA 180 MG 24 hr tablet TAKE 1 TABLET DAILY 30 tablet 0  . metFORMIN (GLUCOPHAGE) 1000 MG tablet TAKE 1 TABLET TWICE DAILY  WITH MEALS 60 tablet 0  . metoprolol succinate (TOPROL-XL) 50 MG 24 hr tablet Take 1 tablet (50 mg total) by mouth daily. 90 tablet 3  . omeprazole (PRILOSEC) 20 MG capsule Take 1 capsule (20 mg total) by mouth daily. 90 capsule 3  . ONETOUCH VERIO test strip USE TO TEST BLOOD SUGARS DAILY. 100 each 3   No current facility-administered medications for this visit.     LABS/IMAGING: No results found for this or any previous visit (from the past 48 hour(s)). No results found.  VITALS: BP (!) 148/82   Pulse 70   Ht 5' 7.5" (1.715 m)   Wt 165 lb 12.8 oz (75.2 kg)   SpO2 97%   BMI 25.58 kg/m   EXAM: General appearance: alert and no distress Neck: no carotid bruit, no JVD and thyroid not enlarged, symmetric, no tenderness/mass/nodules Lungs: clear to auscultation bilaterally Heart: regular rate and rhythm, S1, S2 normal, no murmur, click, rub or gallop Abdomen: soft, non-tender; bowel sounds normal; no masses,  no organomegaly Extremities: extremities normal, atraumatic, no cyanosis or edema and No ecchymosis or hematoma over the right hip Pulses: 2+ and symmetric Skin: Pale, warm, dry Neurologic: Grossly normal Psych: Pleasant  EKG: Sinus rhythm with frequent PVCs at 70, cannot rule out anterior inferior infarct-personally reviewed  ASSESSMENT: 1. Frequent PVCs (new finding) -low risk Myoview stress test with inferior scar and LVEF 48% (02/2018) 2. History of syncope, likely vasovagal 3. Coronary artery disease status post CABG in 2004 4. History of inferior MI 5. Diabetes type  2 6. Peripheral  neuropathy 7. HTN - controlled 8. Dyslipidemia 9. Low risk nuclear stress test 12/11/2012 10. Murmur 11. DOE - EF 50-55% (09/2015) 12. Dilated aortic root to 4.4 cm 13. Bilateral carotid artery disease  PLAN: 1.   Mr. Antonellis continues to do well.  He does have some PVCs but he is unaware of this.  This may be scar mediated.  Generally good blood pressure control.  His diabetes is very well controlled.  Most of this is been due to weight loss.  Does have a dilated aortic root which will need follow-up.  Cholesterol is also at goal.  No medicine changes made today.  Follow-up with me in 6 months or sooner as necessary.  Pixie Casino, MD, [facility], [place] Director of the [facility] Lipid [facility] &  Cardiovascular [facility] Diplomate of the American Board of Clinical Lipidology Attending Cardiologist  Direct Dial: [phone]  Fax: [phone]  Website:  [email or web address] 10/30/2018, 7:42 AM

## 2018-11-05 ENCOUNTER — Other Ambulatory Visit: Payer: Self-pay | Admitting: Family Medicine

## 2018-11-05 NOTE — Telephone Encounter (Signed)
LVM FOR PATIENT TO CALL BACK AND SCHEDULE A F/U APPT

## 2018-11-05 NOTE — Telephone Encounter (Signed)
Last OV 02/26/2018 f/u 4-6 months.   Please contact pt to schedule follow-up visit. Will send in 30 day supply.

## 2018-11-25 ENCOUNTER — Other Ambulatory Visit: Payer: Self-pay | Admitting: Family Medicine

## 2018-11-25 ENCOUNTER — Other Ambulatory Visit: Payer: Self-pay

## 2018-11-25 MED ORDER — METOPROLOL SUCCINATE ER 50 MG PO TB24: 50.0000 mg | ORAL_TABLET | Freq: Every day | ORAL | 0 refills | Status: DC

## 2018-11-26 ENCOUNTER — Other Ambulatory Visit: Payer: Self-pay

## 2018-11-26 MED ORDER — METOPROLOL SUCCINATE ER 50 MG PO TB24: 50.0000 mg | ORAL_TABLET | Freq: Every day | ORAL | 3 refills | Status: DC

## 2018-11-28 ENCOUNTER — Encounter: Payer: Self-pay | Admitting: Family Medicine

## 2018-11-28 ENCOUNTER — Other Ambulatory Visit: Payer: Self-pay

## 2018-11-28 ENCOUNTER — Ambulatory Visit (INDEPENDENT_AMBULATORY_CARE_PROVIDER_SITE_OTHER): Payer: Medicare Other | Admitting: Family Medicine

## 2018-11-28 VITALS — BP 136/72 | HR 70 | Temp 98.2°F | Ht 67.5 in | Wt 166.2 lb

## 2018-11-28 DIAGNOSIS — E119 Type 2 diabetes mellitus without complications: Secondary | ICD-10-CM

## 2018-11-28 DIAGNOSIS — E782 Mixed hyperlipidemia: Secondary | ICD-10-CM | POA: Diagnosis not present

## 2018-11-28 DIAGNOSIS — D692 Other nonthrombocytopenic purpura: Secondary | ICD-10-CM

## 2018-11-28 DIAGNOSIS — E663 Overweight: Secondary | ICD-10-CM

## 2018-11-28 DIAGNOSIS — I77810 Thoracic aortic ectasia: Secondary | ICD-10-CM

## 2018-11-28 DIAGNOSIS — I10 Essential (primary) hypertension: Secondary | ICD-10-CM | POA: Diagnosis not present

## 2018-11-28 DIAGNOSIS — E875 Hyperkalemia: Secondary | ICD-10-CM

## 2018-11-28 LAB — LIPID PANEL
Cholesterol: 134 mg/dL (ref 0–200)
HDL: 36.9 mg/dL — ABNORMAL LOW (ref >39.00)
LDL Cholesterol: 65 mg/dL (ref 0–99)
NonHDL: 97.03
Total CHOL/HDL Ratio: 4
Triglycerides: 160 mg/dL — ABNORMAL HIGH (ref 0.0–149.0)
VLDL: 32 mg/dL (ref 0.0–40.0)

## 2018-11-28 LAB — HEMOGLOBIN A1C: Hgb A1c MFr Bld: 6.5 % (ref 4.6–6.5)

## 2018-11-28 LAB — COMPREHENSIVE METABOLIC PANEL
ALT: 22 U/L (ref 0–53)
AST: 21 U/L (ref 0–37)
Albumin: 4.1 g/dL (ref 3.5–5.2)
Alkaline Phosphatase: 60 U/L (ref 39–117)
BUN: 27 mg/dL — ABNORMAL HIGH (ref 6–23)
CO2: 25 mEq/L (ref 19–32)
Calcium: 9.4 mg/dL (ref 8.4–10.5)
Chloride: 105 mEq/L (ref 96–112)
Creatinine, Ser: 1.23 mg/dL (ref 0.40–1.50)
GFR: 55.84 mL/min — ABNORMAL LOW (ref >60.00)
Glucose, Bld: 132 mg/dL — ABNORMAL HIGH (ref 70–99)
Potassium: 5.3 mEq/L — ABNORMAL HIGH (ref 3.5–5.1)
Sodium: 139 mEq/L (ref 135–145)
Total Bilirubin: 0.5 mg/dL (ref 0.2–1.2)
Total Protein: 6.4 g/dL (ref 6.0–8.3)

## 2018-11-28 LAB — MICROALBUMIN / CREATININE URINE RATIO
Creatinine,U: 193 mg/dL
Microalb Creat Ratio: 3.6 mg/g (ref 0.0–30.0)
Microalb, Ur: 6.9 mg/dL — ABNORMAL HIGH (ref 0.0–1.9)

## 2018-11-28 LAB — CBC
HCT: 36.1 % — ABNORMAL LOW (ref 39.0–52.0)
Hemoglobin: 11.9 g/dL — ABNORMAL LOW (ref 13.0–17.0)
MCHC: 33 g/dL (ref 30.0–36.0)
MCV: 94.6 fl (ref 78.0–100.0)
Platelets: 224 10*3/uL (ref 150.0–400.0)
RBC: 3.81 Mil/uL — ABNORMAL LOW (ref 4.22–5.81)
RDW: 14.3 % (ref 11.5–15.5)
WBC: 7.7 10*3/uL (ref 4.0–10.5)

## 2018-11-28 MED ORDER — METFORMIN HCL 1000 MG PO TABS: 1000.0000 mg | ORAL_TABLET | Freq: Two times a day (BID) | ORAL | 3 refills | Status: DC

## 2018-11-28 NOTE — Progress Notes (Addendum)
Phone [phone]   Subjective:  Steven [name] is a 85 y.o. year old very pleasant male patient who presents for/with See problem oriented charting Chief Complaint  Patient presents with  . Follow-up    not fasting today  . Diabetes  . Hypertension  . Hyperglycemia  . Gastroesophageal Reflux  . Osteoporosis   ROS- Denies HA, dizziness, CP, visual changes.    Past Medical History-  Patient Active Problem List   Diagnosis Date Noted  . Falls frequently 08/21/2017    Priority: High  . Aortic root dilatation ([place]) 10/06/2016    Priority: High  . Osteoporosis 01/07/2015    Priority: High  . CAD, ARTERY BYPASS GRAFT 05/21/2009    Priority: High  . Carotid artery stenosis 04/20/2008    Priority: High  . Diabetes mellitus type II, controlled ([place]) 04/19/2007    Priority: High  . Anemia 09/03/2013    Priority: Medium  . Essential hypertension, benign 10/11/2012    Priority: Medium  . Hyperlipidemia 04/19/2007    Priority: Medium  . Seborrheic dermatitis 09/13/2017    Priority: Low  . Senile purpura ([place]) 08/21/2017    Priority: Low  . History of acute inferior wall MI 09/03/2015    Priority: Low  . Foot drop, left 08/23/2012    Priority: Low  . History of supraventricular tachycardia 01/25/2011    Priority: Low  . Hypogonadism in male 02/04/2010    Priority: Low  . History of colonic polyps 09/07/2009    Priority: Low  . GERD 04/19/2007    Priority: Low  . Hx of CABG 12/13/2017  . PVC's (premature ventricular contractions) 12/13/2017    Medications- reviewed and updated Current Outpatient Medications  Medication Sig Dispense Refill  . alendronate (FOSAMAX) 70 MG tablet TAKE 1 TABLET BY MOUTH EVERY 7 DAYS WITH A FULL GLASS OF WATER ON AN EMPTY STOMACH 12 tablet 4  . aspirin 81 MG tablet Take 81 mg by mouth daily.    Marland Kitchen atorvastatin (LIPITOR) 80 MG tablet TAKE 1 TABLET DAILY. 30 tablet 0  . cycloSPORINE (RESTASIS) 0.05 % ophthalmic emulsion 1 drop 2 (two) times  daily.    Marland Kitchen glimepiride (AMARYL) 1 MG tablet Take 1 tablet (1 mg total) by mouth daily with breakfast. 90 tablet 3  . glucose blood test strip Use to test your blood sugar daily. E11.9 One touch Verio test strips 100 each 12  . ketoconazole (NIZORAL) 2 % shampoo APPLY 2 TIMES A WEEK 120 mL 3  . Lancets (ONETOUCH ULTRASOFT) lancets USE TO TEST BLOOD SUGARS DAILY. 100 each 3  . MATZIM LA 180 MG 24 hr tablet TAKE 1 TABLET DAILY. NEEDS APPOINTMENT BEFORE NEXT    REFILL 30 tablet 0  . metoprolol succinate (TOPROL-XL) 50 MG 24 hr tablet Take 1 tablet (50 mg total) by mouth daily. 90 tablet 3  . omeprazole (PRILOSEC) 20 MG capsule Take 1 capsule (20 mg total) by mouth daily. 90 capsule 3  . ONETOUCH VERIO test strip USE TO TEST BLOOD SUGARS DAILY. 100 each 3  . metFORMIN (GLUCOPHAGE) 1000 MG tablet Take 1 tablet (1,000 mg total) by mouth 2 (two) times daily with a meal. 180 tablet 3   No current facility-administered medications for this visit.      Objective:  BP 136/72   Pulse 70   Temp 98.2 F (36.8 C) (Oral)   Wt 166 lb 3.2 oz (75.4 kg)   SpO2 97%   BMI 25.65 kg/m  Gen: NAD, resting  comfortably CV: RRR no murmurs rubs or gallops Lungs: CTAB no crackles, wheeze, rhonchi Abdomen: soft/nontender/nondistended/normal bowel sounds.   Ext: 1+ edema Skin: warm, dry Neuro: walks with walker  Declines foot exam- does not want to remove brace    Assessment and Plan   # Diabetes S:  controlled on Glimepiride 1 mg daily and Metformin 1000 mg BID.  CBGs- Not checking BG at home. Denies hypoglycemic episodes.  Exercise and diet- Not doing as well with watching sugar and carb intake. Not exercising regularly, plans to start back 2 x weekly.  Lab Results  Component Value Date   HGBA1C 5.9 02/26/2018   HGBA1C 5.5 08/21/2017   HGBA1C 6.0 04/03/2017   A/P: Hopefully A1c remains controlled today- also if A1c remains below 6.5 I discussed with patient my recommendation to discontinue glimepiride  once again-he agrees  #hypertension S: Initially poorly controlled on Matzim LA 180 mg daily and Metoprolol XL 50 mg daily. Not checking BP at home. Reports SOB while wearing mask. Limiting salt intake.   On repeat, blood pressure-looks bettter BP Readings from Last 3 Encounters:  11/28/18 136/72  10/28/18 (!) 148/82  02/27/18 (!) 170/77  A/P: Stable. Continue current medications.   #hyperlipidemia/carotid artery stenosis/cad history S: Hopefully controlled on Atorvastatin 80 mg daily. Tolerating well.   No chest pain or shortness of breath- overdue for cardiology follow up. Carotid stenosis followed by cardiology Lab Results  Component Value Date   CHOL 121 04/03/2017   HDL 38.90 (L) 04/03/2017   LDLCALC 67 04/03/2017   LDLDIRECT 74.0 02/26/2018   TRIG 75.0 04/03/2017   CHOLHDL 3 04/03/2017   A/P: Hopefully controlled on atorvastatin 80 mg daily-update lipid panel today  Encouraged cardiology follow up  # Osteoporosis S:Taking Alendronate 70 mg once a week.  He is not sure if hes taking this.  A/P: 03/2017 dexa- will try to repeat next year. He will go home and make sure  hes taking fosamax  #Aortic root dilation- stable aortic root size 10/19/16.   #Senile purpura- stable.  Aspirin likely contributes-continue to monitor  # three months ago going down cement steps with daughter behind and son in front of him- tried to grab a box while going downstairs since then pain in right lateral hip, right knee, right ankle- fell and hit hip (usually would be using walker). Taking BC powder 1000mg  aspirin once a day.no leg weakness. No falls since that time- using his walker.  Feels like things are getting better- offered x-rays which he declines for now- if doesn't continue to improve or worsens agrees to x-ray- will call us back . Or could refer to Dr. Tamala Julian   # not exercising currently - discussed possible chair exercises- he wants to return to PT hand to resume exercise  Recommended  follow up: 6 month follow up  Lab/Order associations:   ICD-10-CM   1. Controlled type 2 diabetes mellitus without complication, without long-term current use of insulin (HCC)  E11.9 CBC    Comprehensive metabolic panel    Lipid panel    Hemoglobin A1c    Microalbumin / creatinine urine ratio  2. Mixed hyperlipidemia  E78.2   3. Essential hypertension, benign  I10   4. Aortic root dilatation (HCC) Chronic I77.810   5. Senile purpura (HCC) Chronic D69.2   6. Overweight  E66.3    Meds ordered this encounter  Medications  . metFORMIN (GLUCOPHAGE) 1000 MG tablet    Sig: Take 1 tablet (1,000 mg total) by  mouth 2 (two) times daily with a meal.    Dispense:  180 tablet    Refill:  3   Return precautions advised.  [name], MD

## 2018-11-28 NOTE — Patient Instructions (Addendum)
Health Maintenance Due  Topic Date Due  . FOOT EXAM - declined for now 03/29/2018  . OPHTHALMOLOGY EXAM - try to get updated diabetic eye exam and have them send [place] a copy from [facility] 06/13/2018  . INFLUENZA VACCINE We should have flu shots available by September. Please strongly consider getting flu shot this year. If you get your flu shot at a pharmacy- please let us know.  11/16/2018   Call Dr. Debara Pickett of cardiology for follow up  As long as a1c is under 6.5 - stop glimepiride  Make sure you are taking alendronate/fosamax once a week when you get home- restart if notdec  Schedule physical 6 months

## 2018-11-29 NOTE — Addendum Note (Signed)
Addended by: Gwenyth Ober R on: 11/29/2018 12:34 PM   Modules accepted: Orders

## 2018-12-02 ENCOUNTER — Other Ambulatory Visit: Payer: Self-pay

## 2018-12-02 DIAGNOSIS — E875 Hyperkalemia: Secondary | ICD-10-CM

## 2018-12-02 NOTE — Progress Notes (Signed)
Called pt to schedule for lab appt. On 12/04/18

## 2018-12-04 ENCOUNTER — Other Ambulatory Visit (INDEPENDENT_AMBULATORY_CARE_PROVIDER_SITE_OTHER): Payer: Medicare Other

## 2018-12-04 ENCOUNTER — Other Ambulatory Visit: Payer: Self-pay | Admitting: Family Medicine

## 2018-12-04 ENCOUNTER — Other Ambulatory Visit: Payer: Self-pay

## 2018-12-04 DIAGNOSIS — E875 Hyperkalemia: Secondary | ICD-10-CM | POA: Diagnosis not present

## 2018-12-04 LAB — BASIC METABOLIC PANEL
BUN: 27 mg/dL — ABNORMAL HIGH (ref 6–23)
CO2: 24 mEq/L (ref 19–32)
Calcium: 9.1 mg/dL (ref 8.4–10.5)
Chloride: 104 mEq/L (ref 96–112)
Creatinine, Ser: 1.12 mg/dL (ref 0.40–1.50)
GFR: 62.21 mL/min (ref >60.00)
Glucose, Bld: 186 mg/dL — ABNORMAL HIGH (ref 70–99)
Potassium: 4.3 mEq/L (ref 3.5–5.1)
Sodium: 136 mEq/L (ref 135–145)

## 2018-12-04 LAB — HM DIABETES EYE EXAM

## 2018-12-05 ENCOUNTER — Ambulatory Visit: Payer: Self-pay

## 2018-12-05 NOTE — Telephone Encounter (Signed)
Provided lab results to Patient voiced understanding.

## 2018-12-09 ENCOUNTER — Ambulatory Visit (INDEPENDENT_AMBULATORY_CARE_PROVIDER_SITE_OTHER): Payer: Medicare Other

## 2018-12-09 ENCOUNTER — Encounter: Payer: Self-pay | Admitting: Family Medicine

## 2018-12-09 ENCOUNTER — Other Ambulatory Visit: Payer: Self-pay

## 2018-12-09 ENCOUNTER — Ambulatory Visit (INDEPENDENT_AMBULATORY_CARE_PROVIDER_SITE_OTHER): Payer: Medicare Other | Admitting: Family Medicine

## 2018-12-09 VITALS — BP 122/68 | HR 68 | Temp 97.9°F | Ht 67.5 in | Wt 168.4 lb

## 2018-12-09 DIAGNOSIS — M5440 Lumbago with sciatica, unspecified side: Secondary | ICD-10-CM | POA: Diagnosis not present

## 2018-12-09 DIAGNOSIS — G8929 Other chronic pain: Secondary | ICD-10-CM

## 2018-12-09 DIAGNOSIS — M25551 Pain in right hip: Secondary | ICD-10-CM

## 2018-12-09 MED ORDER — ACETAMINOPHEN-CODEINE #3 300-30 MG PO TABS: 1.0000 | ORAL_TABLET | Freq: Four times a day (QID) | ORAL | 0 refills | Status: DC | PRN

## 2018-12-09 MED ORDER — DICLOFENAC SODIUM 1 % TD GEL: 4.0000 g | Freq: Four times a day (QID) | TRANSDERMAL | 1 refills | Status: DC

## 2018-12-09 NOTE — Patient Instructions (Signed)
-sending you to physical therapy for back pain/sciatica. Also have some exercises on here to try at home. They will call to set this up.   -would decrease BC powder, can hurt kidneys if you have a lot of it and has aspirin in it. Just do plain tylenol. If having severe pain, try tylenol 3. This is tylenol with codeine in it. May make you drowsy and can increase risk of powder.   If not better, we may need to consider mri, but symptoms seem consistent with sciatica. Follow up with me if not better.    Sciatica Rehab Ask your health care provider which exercises are safe for you. Do exercises exactly as told by your health care provider and adjust them as directed. It is normal to feel mild stretching, pulling, tightness, or discomfort as you do these exercises. Stop right away if you feel sudden pain or your pain gets worse. Do not begin these exercises until told by your health care provider. Stretching and range-of-motion exercises These exercises warm up your muscles and joints and improve the movement and flexibility of your hips and back. These exercises also help to relieve pain, numbness, and tingling. Sciatic nerve glide 1. Sit in a chair with your head facing down toward your chest. Place your hands behind your back. Let your shoulders slump forward. 2. Slowly straighten one of your legs while you tilt your head back as if you are looking toward the ceiling. Only straighten your leg as far as you can without making your symptoms worse. 3. Hold this position for __________ seconds. 4. Slowly return to the starting position. 5. Repeat with your other leg. Repeat __________ times. Complete this exercise __________ times a day. Knee to chest with hip adduction and internal rotation  1. Lie on your back on a firm surface with both legs straight. 2. Bend one of your knees and move it up toward your chest until you feel a gentle stretch in your lower back and buttock. Then, move your knee  toward the shoulder that is on the opposite side from your leg. This is hip adduction and internal rotation. ? Hold your leg in this position by holding on to the front of your knee. 3. Hold this position for __________ seconds. 4. Slowly return to the starting position. 5. Repeat with your other leg. Repeat __________ times. Complete this exercise __________ times a day. Prone extension on elbows  1. Lie on your abdomen on a firm surface. A bed may be too soft for this exercise. 2. Prop yourself up on your elbows. 3. Use your arms to help lift your chest up until you feel a gentle stretch in your abdomen and your lower back. ? This will place some of your body weight on your elbows. If this is uncomfortable, try stacking pillows under your chest. ? Your hips should stay down, against the surface that you are lying on. Keep your hip and back muscles relaxed. 4. Hold this position for __________ seconds. 5. Slowly relax your upper body and return to the starting position. Repeat __________ times. Complete this exercise __________ times a day. Strengthening exercises These exercises build strength and endurance in your back. Endurance is the ability to use your muscles for a long time, even after they get tired. Pelvic tilt This exercise strengthens the muscles that lie deep in the abdomen. 1. Lie on your back on a firm surface. Bend your knees and keep your feet flat on the floor. 2.  Tense your abdominal muscles. Tip your pelvis up toward the ceiling and flatten your lower back into the floor. ? To help with this exercise, you may place a small towel under your lower back and try to push your back into the towel. 3. Hold this position for __________ seconds. 4. Let your muscles relax completely before you repeat this exercise. Repeat __________ times. Complete this exercise __________ times a day. Alternating arm and leg raises  1. Get on your hands and knees on a firm surface. If you are  on a hard floor, you may want to use padding, such as an exercise mat, to cushion your knees. 2. Line up your arms and legs. Your hands should be directly below your shoulders, and your knees should be directly below your hips. 3. Lift your left leg behind you. At the same time, raise your right arm and straighten it in front of you. ? Do not lift your leg higher than your hip. ? Do not lift your arm higher than your shoulder. ? Keep your abdominal and back muscles tight. ? Keep your hips facing the ground. ? Do not arch your back. ? Keep your balance carefully, and do not hold your breath. 4. Hold this position for __________ seconds. 5. Slowly return to the starting position. 6. Repeat with your right leg and your left arm. Repeat __________ times. Complete this exercise __________ times a day. Posture and body mechanics Good posture and healthy body mechanics can help to relieve stress in your body's tissues and joints. Body mechanics refers to the movements and positions of your body while you do your daily activities. Posture is part of body mechanics. Good posture means:  Your spine is in its natural S-curve position (neutral).  Your shoulders are pulled back slightly.  Your head is not tipped forward. Follow these guidelines to improve your posture and body mechanics in your everyday activities. Standing   When standing, keep your spine neutral and your feet about hip width apart. Keep a slight bend in your knees. Your ears, shoulders, and hips should line up.  When you do a task in which you stand in one place for a long time, place one foot up on a stable object that is 2-4 inches (5-10 cm) high, such as a footstool. This helps keep your spine neutral. Sitting   When sitting, keep your spine neutral and keep your feet flat on the floor. Use a footrest, if necessary, and keep your thighs parallel to the floor. Avoid rounding your shoulders, and avoid tilting your head forward.   When working at a desk or a computer, keep your desk at a height where your hands are slightly lower than your elbows. Slide your chair under your desk so you are close enough to maintain good posture.  When working at a computer, place your monitor at a height where you are looking straight ahead and you do not have to tilt your head forward or downward to look at the screen. Resting  When lying down and resting, avoid positions that are most painful for you.  If you have pain with activities such as sitting, bending, stooping, or squatting, lie in a position in which your body does not bend very much. For example, avoid curling up on your side with your arms and knees near your chest (fetal position).  If you have pain with activities such as standing for a long time or reaching with your arms, lie with your spine  in a neutral position and bend your knees slightly. Try the following positions: ? Lying on your side with a pillow between your knees. ? Lying on your back with a pillow under your knees. Lifting   When lifting objects, keep your feet at least shoulder width apart and tighten your abdominal muscles.  Bend your knees and hips and keep your spine neutral. It is important to lift using the strength of your legs, not your back. Do not lock your knees straight out.  Always ask for help to lift heavy or awkward objects. This information is not intended to replace advice given to you by your health care provider. Make sure you discuss any questions you have with your health care provider. Document Released: 04/03/2005 Document Revised: 07/26/2018 Document Reviewed: 04/25/2018 Elsevier Patient Education  2020 [employer].

## 2018-12-09 NOTE — Progress Notes (Signed)
Patient: Steven [name] MRN: [id number] DOB: July 15, 1930 PCP: Marin Olp, MD     Subjective:  Chief Complaint  Patient presents with  . Back Pain    HPI: The patient is a 85 y.o. male who presents today for back pain x 3 months. He states 3 months ago [place] and fell on his bottom. He assumed it would go away, but it has not. Pain is on his right hip and goes down back of his leg and down to his ankle. He has taken some bc powder and gives him relief for 2-4 hours. Pain rated as a 10/10 and described as sharp with the radiation down the back of his leg. No weakness in his leg and no loss of sensation. Pain comes and goes when he bends over to the left. Leg is not shorter on the right. He has not taken anything besides the bc powder. Has a hx of osteoporosis.   Review of Systems  Constitutional: Negative for chills and fever.  Respiratory: Negative for shortness of breath.   Genitourinary: Negative for difficulty urinating.  Musculoskeletal: Positive for back pain and myalgias.       C/o lower back pain that radiates down buttock and into back of R thigh and down to ankle    Allergies Patient has No Known Allergies.  Past Medical History Patient  has a past medical history of Abnormality of gait (08/23/2012), Anemia, Arthritis (01-03-12), CAD (coronary artery disease) (2004), Degenerative arthritis, Diabetes mellitus, Foot drop, bilateral (08/23/2012), GERD (gastroesophageal reflux disease), Heart disease, History of kidney stones, History of myocardial infarction, Hyperlipidemia, Myocardial infarction ([place]) (01-03-12), MYOCARDIAL INFARCTION, HX OF (04/19/2007), Neuropathy (01-03-12), Pneumonia, Polyneuropathy in diabetes(357.2) (08/23/2012), and Radiculopathy of lumbar region (08/23/2011).  Surgical History Patient  has a past surgical history that includes Coronary artery bypass graft (2004); Tonsillectomy (as child); Colonoscopy; Laminectomy  (09/08/2011); Cardiac catheterization (01-03-12); Total knee arthroplasty (01/15/2012); Back surgery (2013); Total knee arthroplasty (Left, 09/16/2012); and Cataract extraction (Bilateral, 2015).  Family History Pateint's family history includes Cancer in his sister; Cirrhosis in his mother; Heart disease in his maternal grandfather; Hypertension in his father; Stroke in his father, maternal grandmother, paternal grandfather, and paternal grandmother.  Social History Patient  reports that he has never smoked. He has never used smokeless tobacco. He reports that he does not drink alcohol or use drugs.    Objective: Vitals:   12/09/18 1450  BP: 122/68  Pulse: 68  Temp: 97.9 F (36.6 C)  TempSrc: Skin  SpO2: 98%  Weight: 168 lb 6.4 oz (76.4 kg)  Height: 5' 7.5" (1.715 m)    Body mass index is 25.99 kg/m.  Physical Exam Vitals signs reviewed.  Constitutional:      Appearance: Normal appearance.     Comments: Kyphotic   HENT:     Head: Normocephalic and atraumatic.  Pulmonary:     Effort: Pulmonary effort is normal.  Musculoskeletal:        General: Tenderness (right greater trochanteric process ) present.     Comments: Strength intact in lower legs. 5/5 bilateral. Weak hip abduction/adduction. Sensation intact. Bilateral knee replacement. Braces on both lower legs.  No pain on palpation in lower back, but pain SI and inferior to this into right gluteus.   Neurological:     Mental Status: He is alert.    Xray hip: OA, no acute finding. Official read pending Lumbar spine: scoliosis, osteoporosis, do not think  he has a compression fracture, but waiting for official read.     Assessment/plan: 1. Chronic right-sided low back pain with sciatica, sciatica laterality unspecified Sciatica and possible greater trochanteric bursitis. Exercises given, but I do not think he will be able to do these at home- a lot of on the floor exercises. PT ordered for him. voltaren gel and tylenol for  pain. Will send in tylenol 3 for severe pain. Discussed with patient and daughter fall risk/drowsy and to make sure he is supervised when he takes this. No more BC powder or very judicially. Watch tylenol dosage. No more than 3000mg /24 hours. Did discuss ? Vertebral fracture, but do not think I see and will wait for official read  - DG Lumbar Spine 2-3 Views; Future - Ambulatory referral to Physical Therapy  2. Right hip pain  - DG HIP UNILAT W OR W/O PELVIS 2-3 VIEWS RIGHT; Future  Return if symptoms worsen or fail to improve.   Orma Flaming, MD [place]   12/09/2018

## 2018-12-10 ENCOUNTER — Other Ambulatory Visit: Payer: Self-pay | Admitting: Family Medicine

## 2018-12-10 DIAGNOSIS — I714 Abdominal aortic aneurysm, without rupture, unspecified: Secondary | ICD-10-CM

## 2018-12-10 NOTE — Progress Notes (Signed)
Hey stephanie- Please make sure I ordered this correctly. He needs ultrasound for aortic anerysm seen on xray. Ordered asap, doesn't need to be stat. Let me know if I need to change order.  Thanks!  Dr. Rogers Blocker

## 2018-12-10 NOTE — Progress Notes (Signed)
The order itself looks right - imaging will reach out if we need to change something. The imaging location is wrong - you cannot put External on any imaging referral - you need to select an actual location or the referral goes no where - no one can see it.  Please change this location to [facility].

## 2018-12-11 ENCOUNTER — Other Ambulatory Visit: Payer: Self-pay | Admitting: Family Medicine

## 2018-12-11 DIAGNOSIS — I714 Abdominal aortic aneurysm, without rupture, unspecified: Secondary | ICD-10-CM

## 2018-12-11 NOTE — Progress Notes (Signed)
Changed order to [place].  Orma Flaming, MD [place]

## 2018-12-12 ENCOUNTER — Other Ambulatory Visit: Payer: Self-pay

## 2018-12-12 ENCOUNTER — Ambulatory Visit (HOSPITAL_COMMUNITY)
Admission: RE | Admit: 2018-12-12 | Discharge: 2018-12-12 | Disposition: A | Discharge status: Home / Self Care | Payer: Medicare Other | Source: Ambulatory Visit | Attending: Family Medicine | Admitting: Family Medicine

## 2018-12-12 DIAGNOSIS — I714 Abdominal aortic aneurysm, without rupture, unspecified: Secondary | ICD-10-CM

## 2018-12-13 ENCOUNTER — Encounter: Payer: Self-pay | Admitting: Family Medicine

## 2018-12-13 DIAGNOSIS — I714 Abdominal aortic aneurysm, without rupture, unspecified: Secondary | ICD-10-CM | POA: Insufficient documentation

## 2018-12-16 ENCOUNTER — Telehealth: Payer: Self-pay

## 2018-12-16 NOTE — Telephone Encounter (Signed)
PA needed for Diclofenac 1% gel  [email or web address] Key [id number]

## 2018-12-17 NOTE — Telephone Encounter (Signed)
Initiated via [email or web address]

## 2018-12-17 NOTE — Telephone Encounter (Signed)
Steven [name] (Key: [id number])  Your information has been submitted to [place] [employer] Part D. [employer] will review the request and will issue a decision, typically within 1-3 days from your submission. You can check the updated outcome later by reopening this request.  If [employer] has not responded in 1-3 days or if you have any questions about your ePA request, please contact [place] [employer] Part D at [phone]. If you think there may be a problem with your PA request, use our live chat feature at the bottom right.

## 2018-12-17 NOTE — Telephone Encounter (Signed)
Steven [name] (Key: [id number])  This request has received an Unfavorable outcome.  An eAppeal may be available. View the bottom of the request to see if an eAppeal is available along with any additional information provided by [employer].

## 2018-12-18 ENCOUNTER — Encounter: Payer: Self-pay | Admitting: Family Medicine

## 2018-12-18 ENCOUNTER — Other Ambulatory Visit: Payer: Self-pay | Admitting: Family Medicine

## 2018-12-19 ENCOUNTER — Encounter: Payer: Self-pay | Admitting: Physical Therapy

## 2018-12-19 ENCOUNTER — Ambulatory Visit (INDEPENDENT_AMBULATORY_CARE_PROVIDER_SITE_OTHER): Payer: Medicare Other | Admitting: Physical Therapy

## 2018-12-19 ENCOUNTER — Ambulatory Visit: Payer: Medicare Other

## 2018-12-19 DIAGNOSIS — R293 Abnormal posture: Secondary | ICD-10-CM

## 2018-12-19 DIAGNOSIS — G8929 Other chronic pain: Secondary | ICD-10-CM

## 2018-12-19 DIAGNOSIS — M25551 Pain in right hip: Secondary | ICD-10-CM

## 2018-12-19 DIAGNOSIS — M5441 Lumbago with sciatica, right side: Secondary | ICD-10-CM

## 2018-12-24 ENCOUNTER — Ambulatory Visit (INDEPENDENT_AMBULATORY_CARE_PROVIDER_SITE_OTHER): Payer: Medicare Other | Admitting: Physical Therapy

## 2018-12-24 ENCOUNTER — Encounter: Payer: Self-pay | Admitting: Physical Therapy

## 2018-12-24 ENCOUNTER — Other Ambulatory Visit: Payer: Self-pay

## 2018-12-24 DIAGNOSIS — M25551 Pain in right hip: Secondary | ICD-10-CM | POA: Diagnosis not present

## 2018-12-24 DIAGNOSIS — M5441 Lumbago with sciatica, right side: Secondary | ICD-10-CM | POA: Diagnosis not present

## 2018-12-24 DIAGNOSIS — R293 Abnormal posture: Secondary | ICD-10-CM | POA: Diagnosis not present

## 2018-12-24 DIAGNOSIS — G8929 Other chronic pain: Secondary | ICD-10-CM

## 2018-12-24 NOTE — Therapy (Signed)
[facility] [address] [place], [place], [address] Phone: [phone]   Fax:  [phone]  Physical Therapy Treatment  Patient Details  Name: Steven [name] MRN: [id number] Date of Birth: November 30, 1930 Referring Provider (PT): Orma Flaming   Encounter Date: 12/24/2018  PT End of Session - 12/24/18 2154    Visit Number  2    Number of Visits  12    Date for PT Re-Evaluation  01/30/19    Authorization Type  [employer] Medicare    PT Start Time  [id number]    PT Stop Time  1513    PT Time Calculation (min)  41 min    Activity Tolerance  Patient tolerated treatment well    Behavior During Therapy  [facility] for tasks assessed/performed;Impulsive       Past Medical History:  Diagnosis Date  . Abnormality of gait 08/23/2012  . Anemia    hx of  . Arthritis 01-03-12   osteoarthritis-knees  . CAD (coronary artery disease) 2004   s/p inferior wall Mi 2004 with PTCA/Stent; s/p CABG 2004  . Degenerative arthritis   . Diabetes mellitus    type II  . Foot drop, bilateral 08/23/2012  . GERD (gastroesophageal reflux disease)   . Heart disease   . History of kidney stones   . History of myocardial infarction   . Hyperlipidemia   . Myocardial infarction [facility]) 01-03-12   '04  . MYOCARDIAL INFARCTION, HX OF 04/19/2007   Qualifier: Diagnosis of  By: Scherrie Gerlach    . Neuropathy 01-03-12   bil. feet  . Pneumonia    hx of  . Polyneuropathy in diabetes(357.2) 08/23/2012  . Radiculopathy of lumbar region 08/23/2011   Followed by Dr. Vertell Limber. S/p surgery. Resolved after surgery.      Past Surgical History:  Procedure Laterality Date  . BACK SURGERY  2013  . CARDIAC CATHETERIZATION  01-03-12   '04  . CATARACT EXTRACTION Bilateral 2015  . COLONOSCOPY    . CORONARY ARTERY BYPASS GRAFT  2004   LIMA to LAD; SVG to ramus intermedius; SVG to PDA/PLSA  . LAMINECTOMY  09/08/2011   Procedure: LUMBAR LAMINECTOMY FOR TUMOR;  Surgeon: Erline Levine, MD;  Location: [facility];  Service: Neurosurgery;  Laterality: Left;  Left Thoracic twelve-Lumbar one transpedicular resection of epidural mass  . TONSILLECTOMY  as child  . TOTAL KNEE ARTHROPLASTY  01/15/2012   Procedure: TOTAL KNEE ARTHROPLASTY;  Surgeon: Gearlean Alf, MD;  Location: [facility];  Service: Orthopedics;  Laterality: Right;  . TOTAL KNEE ARTHROPLASTY Left 09/16/2012   Procedure: LEFT TOTAL KNEE ARTHROPLASTY;  Surgeon: Gearlean Alf, MD;  Location: [facility];  Service: Orthopedics;  Laterality: Left;    There were no vitals filed for this visit.  Subjective Assessment - 12/24/18 2152    Subjective  Pt with no new complaints. States back is sore.    Limitations  Sitting    Currently in Pain?  Yes    Pain Score  9     Pain Location  Back    Pain Orientation  Right    Pain Descriptors / Indicators  Aching;Sharp    Pain Type  Acute pain    Pain Onset  More than a month ago    Pain Frequency  Intermittent                       [facility] Adult PT Treatment/Exercise - 12/24/18 2157  Exercises   Exercises  Lumbar      Lumbar Exercises: Stretches   Active Hamstring Stretch  3 reps;30 seconds    Active Hamstring Stretch Limitations  seated    Single Knee to Chest Stretch  3 reps;30 seconds;Right;Left    Pelvic Tilt  20 reps      Lumbar Exercises: Seated   Other Seated Lumbar Exercises  Clam RTB, Pelvic tilts to achieve upright posture x10, with manual assist (sheet)     Other Seated Lumbar Exercises  Scap retractions x20      Manual Therapy   Manual Therapy  Manual Traction    Manual Traction  Long leg distraction on R x3 min for lumbar              PT Education - 12/24/18 2154    Education Details  Initial HEP, posture.    Person(s) Educated  Patient    Methods  Explanation;Demonstration;Tactile cues;Verbal cues;Handout    Comprehension  Verbalized understanding;Returned demonstration;Verbal cues required;Tactile cues required;Need further instruction       PT  Short Term Goals - 12/24/18 2133      PT SHORT TERM GOAL #1   Title  Pt to be independent with initial HEP    Time  2    Period  Weeks    Status  New    Target Date  01/02/19        PT Long Term Goals - 12/24/18 2133      PT LONG TERM GOAL #1   Title  Pt to be independent with final HEP for back/hip pain .    Time  6    Period  Weeks    Status  New    Target Date  01/30/19      PT LONG TERM GOAL #2   Title  Pt to report decreased pain in R side of Back/Hip to 0-3/10 with seated and standing activity.    Time  6    Period  Weeks    Status  New    Target Date  01/30/19      PT LONG TERM GOAL #3   Title  Pt to demo ability to self correct posture, to his optimal available position, to improve LBP.    Time  6    Period  Weeks    Status  New    Target Date  01/30/19      PT LONG TERM GOAL #4   Title  Pt to demo increased strength of core and LEs to at least 4+/5 to improve stability and pain.    Time  6    Period  Weeks    Status  New    Target Date  01/30/19            Plan - 12/24/18 2155    Clinical Impression Statement  Pt educated on posture today, tactile cues needed to achieve optimal posture and pelvic tilt in sitting. Pt with much difficulty with this, and will benefit from continued practice for this. Pt requires max cuing with education on Ther ex for HEP today. He will benefit from progression of strengthening and pain relief techniques.    Personal Factors and Comorbidities  Age;Past/Current Experience;Comorbidity 1    Comorbidities  Prev Back surgery, posture, DM, MI, Neruopathy, Memory    Examination-Activity Limitations  Bathing;Locomotion Level;Transfers;Bed Mobility;Bend;Sit;Carry;Squat;Stairs;Stand;Lift    Examination-Participation Restrictions  Meal Prep;Cleaning;Community Activity;Driving;Shop;Yard Work;Laundry;Medication Management    Stability/Clinical Decision Making  Evolving/Moderate complexity  Rehab Potential  Fair    PT Frequency  2x  / week    PT Duration  6 weeks    PT Treatment/Interventions  ADLs/Self Care Home Management;Cryotherapy;Electrical Stimulation;DME Instruction;Ultrasound;Moist Heat;Iontophoresis 4mg /ml Dexamethasone;Gait training;Stair training;Functional mobility training;Therapeutic activities;Therapeutic exercise;Balance training;Neuromuscular re-education;Manual techniques;Patient/family education;Passive range of motion;Dry needling;Energy conservation;Joint Manipulations;Spinal Manipulations;Taping    Consulted and Agree with Plan of Care  Patient       Patient will benefit from skilled therapeutic intervention in order to improve the following deficits and impairments:  Abnormal gait, Decreased coordination, Decreased range of motion, Difficulty walking, Increased muscle spasms, Decreased safety awareness, Decreased endurance, Decreased activity tolerance, Decreased knowledge of precautions, Decreased skin integrity, Impaired perceived functional ability, Pain, Improper body mechanics, Impaired flexibility, Hypomobility, Decreased knowledge of use of DME, Decreased balance, Decreased cognition, Decreased mobility, Decreased strength, Increased edema, Postural dysfunction  Visit Diagnosis: Chronic right-sided low back pain with right-sided sciatica  Pain in right hip  Abnormal posture     Problem List Patient Active Problem List   Diagnosis Date Noted  . AAA (abdominal aortic aneurysm) ([place]) 12/13/2018  . Hx of CABG 12/13/2017  . PVC's (premature ventricular contractions) 12/13/2017  . Seborrheic dermatitis 09/13/2017  . Falls frequently 08/21/2017  . Senile purpura ([place]) 08/21/2017  . Aortic root dilatation ([place]) 10/06/2016  . History of acute inferior wall MI 09/03/2015  . Osteoporosis 01/07/2015  . Anemia 09/03/2013  . Essential hypertension, benign 10/11/2012  . Foot drop, left 08/23/2012  . History of supraventricular tachycardia 01/25/2011  . Hypogonadism in male 02/04/2010  .  History of colonic polyps 09/07/2009  . CAD, ARTERY BYPASS GRAFT 05/21/2009  . Carotid artery stenosis 04/20/2008  . Diabetes mellitus type II, controlled ([place]) 04/19/2007  . Hyperlipidemia 04/19/2007  . GERD 04/19/2007    Lyndee Hensen, PT, DPT 10:00 PM  12/24/18    [place], [place], [address] Phone: [phone]   Fax:  [phone]  Name: KHILAN GILLISON MRN: [id number] Date of Birth: Sep 08, 1930

## 2018-12-24 NOTE — Patient Instructions (Signed)
Access Code: [email or web address]  URL: [email or web address]  Date: 12/24/2018  Prepared by: Lyndee Hensen   Exercises Seated Hamstring Stretch - 3 reps - 30 hold - 2x daily Seated Hip Abduction with Resistance - 10 reps - 2 sets - 3 hold - 1x daily Seated Scapular Retraction - 10 reps - 1 sets - 2x daily Seated Correct Posture

## 2018-12-24 NOTE — Therapy (Signed)
[facility] [address] [place], [place], [address] Phone: [phone]   Fax:  [phone]  Physical Therapy Evaluation  Patient Details  Name: Steven [name] MRN: [id number] Date of Birth: Jan 31, 1931 Referring Provider (PT): Orma Flaming   Encounter Date: 12/19/2018  PT End of Session - 12/24/18 2129    Visit Number  1    Number of Visits  12    Date for PT Re-Evaluation  01/30/19    Authorization Type  [employer] Medicare    PT Start Time  1525    PT Stop Time  1600    PT Time Calculation (min)  35 min    Activity Tolerance  Patient tolerated treatment well    Behavior During Therapy  [facility] for tasks assessed/performed;Impulsive       Past Medical History:  Diagnosis Date  . Abnormality of gait 08/23/2012  . Anemia    hx of  . Arthritis 01-03-12   osteoarthritis-knees  . CAD (coronary artery disease) 2004   s/p inferior wall Mi 2004 with PTCA/Stent; s/p CABG 2004  . Degenerative arthritis   . Diabetes mellitus    type II  . Foot drop, bilateral 08/23/2012  . GERD (gastroesophageal reflux disease)   . Heart disease   . History of kidney stones   . History of myocardial infarction   . Hyperlipidemia   . Myocardial infarction [facility]) 01-03-12   '04  . MYOCARDIAL INFARCTION, HX OF 04/19/2007   Qualifier: Diagnosis of  By: Scherrie Gerlach    . Neuropathy 01-03-12   bil. feet  . Pneumonia    hx of  . Polyneuropathy in diabetes(357.2) 08/23/2012  . Radiculopathy of lumbar region 08/23/2011   Followed by Dr. Vertell Limber. S/p surgery. Resolved after surgery.      Past Surgical History:  Procedure Laterality Date  . BACK SURGERY  2013  . CARDIAC CATHETERIZATION  01-03-12   '04  . CATARACT EXTRACTION Bilateral 2015  . COLONOSCOPY    . CORONARY ARTERY BYPASS GRAFT  2004   LIMA to LAD; SVG to ramus intermedius; SVG to PDA/PLSA  . LAMINECTOMY  09/08/2011   Procedure: LUMBAR LAMINECTOMY FOR TUMOR;  Surgeon: Erline Levine, MD;  Location: [place] [facility]  [facility];  Service: Neurosurgery;  Laterality: Left;  Left Thoracic twelve-Lumbar one transpedicular resection of epidural mass  . TONSILLECTOMY  as child  . TOTAL KNEE ARTHROPLASTY  01/15/2012   Procedure: TOTAL KNEE ARTHROPLASTY;  Surgeon: Gearlean Alf, MD;  Location: [facility] ORS;  Service: Orthopedics;  Laterality: Right;  . TOTAL KNEE ARTHROPLASTY Left 09/16/2012   Procedure: LEFT TOTAL KNEE ARTHROPLASTY;  Surgeon: Gearlean Alf, MD;  Location: [facility] ORS;  Service: Orthopedics;  Laterality: Left;    There were no vitals filed for this visit.   Subjective Assessment - 12/24/18 2118    Subjective  Pt states recent incident(cant remember what it was) , and back started huring in B low lumbar region, then pt states it was 2 yrs ago. Does have recent AAA.  Using RW today, cant remember when he started using it, but may have been when he fell on steps. Pt with poor memory of PMH and timelines. He lives with daughter who he states helps him a great deal, with driving, shopping, etc. He states increased pain in R low back, states increased with sitting, and leaning to the R.    Limitations  Sitting;Standing;House hold activities    Patient Stated Goals  Decreased pain in back.  Currently in Pain?  Yes    Pain Score  10-Worst pain ever    Pain Location  Hip    Pain Orientation  Right    Pain Descriptors / Indicators  Aching;Sharp    Pain Type  Acute pain    Pain Radiating Towards  R LE, into ankle at times.    Pain Onset  More than a month ago    Pain Frequency  Intermittent    Aggravating Factors   Sitting, leaning to R    Pain Relieving Factors  unable to state         [facility] PT Assessment - 12/24/18 0001      Assessment   Medical Diagnosis  Back Pain    Referring Provider (PT)  Orma Flaming    Prior Therapy  No      Precautions   Precautions  Fall      Balance Screen   Has the patient fallen in the past 6 months  No    Has the patient had a decrease in activity level because of a fear  of falling?   No    Is the patient reluctant to leave their home because of a fear of falling?   No      Prior Function   Level of Independence  Independent with basic ADLs;Independent with household mobility with device      Cognition   Memory  Impaired      Posture/Postural Control   Posture Comments  Sitting in significant PPT, unable to correct to neutral today with cuing.       ROM / Strength   AROM / PROM / Strength  AROM;Strength      AROM   Overall AROM Comments  Lumbar: Sig limitations for Ext, SB,  Mod limitation for flexion;  Hips: WFL: Knee: WFL      Strength   Overall Strength Comments  LEs: Hips: 4-/5, Knees: 4/5 ; Core: 2/5       Palpation   Palpation comment  Pain at R SI, R glute, Pain increased more with Pt R SB(loading) vs palpation.       Special Tests   Other special tests  Neg SLR      Ambulation/Gait   Gait Comments  Poor safety awareness, using RW, increased trunk flexion                 Objective measurements completed on examination: See above findings.              PT Education - 12/24/18 2129    Education Details  PT POC, Exam findings.    Person(s) Educated  Patient    Methods  Explanation    Comprehension  Verbalized understanding;Returned demonstration       PT Short Term Goals - 12/24/18 2133      PT SHORT TERM GOAL #1   Title  Pt to be independent with initial HEP    Time  2    Period  Weeks    Status  New    Target Date  01/02/19        PT Long Term Goals - 12/24/18 2133      PT LONG TERM GOAL #1   Title  Pt to be independent with final HEP for back/hip pain .    Time  6    Period  Weeks    Status  New    Target Date  01/30/19      PT  LONG TERM GOAL #2   Title  Pt to report decreased pain in R side of Back/Hip to 0-3/10 with seated and standing activity.    Time  6    Period  Weeks    Status  New    Target Date  01/30/19      PT LONG TERM GOAL #3   Title  Pt to demo ability to self correct  posture, to his optimal available position, to improve LBP.    Time  6    Period  Weeks    Status  New    Target Date  01/30/19      PT LONG TERM GOAL #4   Title  Pt to demo increased strength of core and LEs to at least 4+/5 to improve stability and pain.    Time  6    Period  Weeks    Status  New    Target Date  01/30/19             Plan - 12/24/18 2136    Clinical Impression Statement  Pt presents wtih primary complaint of increased pain in low back and R hip region. Pt with very poor seated posture, with PPT, likely contributing to pain. Pt with decreased strength of LEs and core, and poor endurance for standing activity. He has decreased safety awareness, and requires use of RW for ambulation. He has most pain in R low lumbar reigon, into R glute and hip. Pt with lack of effective HEP. He has several other co-morbidities, and poor memory of his PMH. He has increased swelling of R LE, wears Bil AFOs. Pt with decreased ability for full functional activities, due to pain. pt to benefit from skilled PT to improve pain and safety with functional mobility.    Personal Factors and Comorbidities  Age;Past/Current Experience;Comorbidity 1    Comorbidities  Prev Back surgery, posture, DM, MI, Neruopathy, Memory    Examination-Activity Limitations  Bathing;Locomotion Level;Transfers;Bed Mobility;Bend;Sit;Carry;Squat;Stairs;Stand;Lift    Examination-Participation Restrictions  Meal Prep;Cleaning;Community Activity;Driving;Shop;Yard Work;Laundry;Medication Management    Stability/Clinical Decision Making  Evolving/Moderate complexity    Clinical Decision Making  Moderate    Rehab Potential  Fair    PT Frequency  2x / week    PT Duration  6 weeks    PT Treatment/Interventions  ADLs/Self Care Home Management;Cryotherapy;Electrical Stimulation;DME Instruction;Ultrasound;Moist Heat;Iontophoresis 4mg /ml Dexamethasone;Gait training;Stair training;Functional mobility training;Therapeutic  activities;Therapeutic exercise;Balance training;Neuromuscular re-education;Manual techniques;Patient/family education;Passive range of motion;Dry needling;Energy conservation;Joint Manipulations;Spinal Manipulations;Taping    Consulted and Agree with Plan of Care  Patient       Patient will benefit from skilled therapeutic intervention in order to improve the following deficits and impairments:  Abnormal gait, Decreased coordination, Decreased range of motion, Difficulty walking, Increased muscle spasms, Decreased safety awareness, Decreased endurance, Decreased activity tolerance, Decreased knowledge of precautions, Decreased skin integrity, Impaired perceived functional ability, Pain, Improper body mechanics, Impaired flexibility, Hypomobility, Decreased knowledge of use of DME, Decreased balance, Decreased cognition, Decreased mobility, Decreased strength, Increased edema, Postural dysfunction  Visit Diagnosis: Chronic right-sided low back pain with right-sided sciatica  Pain in right hip  Abnormal posture     Problem List Patient Active Problem List   Diagnosis Date Noted  . AAA (abdominal aortic aneurysm) ([place]) 12/13/2018  . Hx of CABG 12/13/2017  . PVC's (premature ventricular contractions) 12/13/2017  . Seborrheic dermatitis 09/13/2017  . Falls frequently 08/21/2017  . Senile purpura ([place]) 08/21/2017  . Aortic root dilatation ([place]) 10/06/2016  . History of acute inferior wall  MI 09/03/2015  . Osteoporosis 01/07/2015  . Anemia 09/03/2013  . Essential hypertension, benign 10/11/2012  . Foot drop, left 08/23/2012  . History of supraventricular tachycardia 01/25/2011  . Hypogonadism in male 02/04/2010  . History of colonic polyps 09/07/2009  . CAD, ARTERY BYPASS GRAFT 05/21/2009  . Carotid artery stenosis 04/20/2008  . Diabetes mellitus type II, controlled ([place]) 04/19/2007  . Hyperlipidemia 04/19/2007  . GERD 04/19/2007    Lyndee Hensen, PT, DPT 9:50 PM   12/24/18    [facility] [place], [place], [address] Phone: [phone]   Fax:  [phone]  Name: KAMYAR CAMMISA MRN: [id number] Date of Birth: 1930/09/08

## 2018-12-26 ENCOUNTER — Other Ambulatory Visit: Payer: Self-pay

## 2018-12-26 ENCOUNTER — Ambulatory Visit (INDEPENDENT_AMBULATORY_CARE_PROVIDER_SITE_OTHER): Payer: Medicare Other

## 2018-12-26 DIAGNOSIS — Z23 Encounter for immunization: Secondary | ICD-10-CM

## 2018-12-28 ENCOUNTER — Other Ambulatory Visit: Payer: Self-pay | Admitting: Family Medicine

## 2018-12-28 ENCOUNTER — Other Ambulatory Visit: Payer: Self-pay | Admitting: Internal Medicine

## 2018-12-31 ENCOUNTER — Ambulatory Visit (INDEPENDENT_AMBULATORY_CARE_PROVIDER_SITE_OTHER): Payer: Medicare Other | Admitting: Physical Therapy

## 2018-12-31 DIAGNOSIS — M25551 Pain in right hip: Secondary | ICD-10-CM | POA: Diagnosis not present

## 2018-12-31 DIAGNOSIS — M5441 Lumbago with sciatica, right side: Secondary | ICD-10-CM | POA: Diagnosis not present

## 2018-12-31 DIAGNOSIS — G8929 Other chronic pain: Secondary | ICD-10-CM | POA: Diagnosis not present

## 2018-12-31 DIAGNOSIS — R293 Abnormal posture: Secondary | ICD-10-CM | POA: Diagnosis not present

## 2019-01-02 ENCOUNTER — Encounter: Payer: Self-pay | Admitting: Physical Therapy

## 2019-01-02 NOTE — Therapy (Signed)
[facility] [address] [place], [place], [address] Phone: [phone]   Fax:  [phone]  Physical Therapy Treatment  Patient Details  Name: Steven [name] MRN: [id number] Date of Birth: [birth year] Referring Provider (PT): Orma Flaming   Encounter Date: 12/31/2018  PT End of Session - 01/02/19 0829    Visit Number  3    Number of Visits  12    Date for PT Re-Evaluation  01/30/19    Authorization Type  [employer]HC Medicare    PT Start Time  [id number]    PT Stop Time  1650    PT Time Calculation (min)  40 min    Activity Tolerance  Patient tolerated treatment well    Behavior During Therapy  [facility] for tasks assessed/performed;Impulsive       Past Medical History:  Diagnosis Date  . Abnormality of gait 08/23/2012  . Anemia    hx of  . Arthritis 01-03-12   osteoarthritis-knees  . CAD (coronary artery disease) 2004   s/p inferior wall Mi 2004 with PTCA/Stent; s/p CABG 2004  . Degenerative arthritis   . Diabetes mellitus    type II  . Foot drop, bilateral 08/23/2012  . GERD (gastroesophageal reflux disease)   . Heart disease   . History of kidney stones   . History of myocardial infarction   . Hyperlipidemia   . Myocardial infarction [facility]) 01-03-12   '04  . MYOCARDIAL INFARCTION, HX OF 04/19/2007   Qualifier: Diagnosis of  By: Scherrie Gerlach    . Neuropathy 01-03-12   bil. feet  . Pneumonia    hx of  . Polyneuropathy in diabetes(357.2) 08/23/2012  . Radiculopathy of lumbar region 08/23/2011   Followed by Dr. Vertell Limber. S/p surgery. Resolved after surgery.      Past Surgical History:  Procedure Laterality Date  . BACK SURGERY  2013  . CARDIAC CATHETERIZATION  01-03-12   '04  . CATARACT EXTRACTION Bilateral 2015  . COLONOSCOPY    . CORONARY ARTERY BYPASS GRAFT  2004   LIMA to LAD; SVG to ramus intermedius; SVG to PDA/PLSA  . LAMINECTOMY  09/08/2011   Procedure: LUMBAR LAMINECTOMY FOR TUMOR;  Surgeon: Erline Levine, MD;  Location: [place] NEURO  ORS;  Service: Neurosurgery;  Laterality: Left;  Left Thoracic twelve-Lumbar one transpedicular resection of epidural mass  . TONSILLECTOMY  as child  . TOTAL KNEE ARTHROPLASTY  01/15/2012   Procedure: TOTAL KNEE ARTHROPLASTY;  Surgeon: Gearlean Alf, MD;  Location: [facility]S;  Service: Orthopedics;  Laterality: Right;  . TOTAL KNEE ARTHROPLASTY Left 09/16/2012   Procedure: LEFT TOTAL KNEE ARTHROPLASTY;  Surgeon: Gearlean Alf, MD;  Location: [facility] ORS;  Service: Orthopedics;  Laterality: Left;    There were no vitals filed for this visit.  Subjective Assessment - 01/02/19 0827    Subjective  Pt states most pain in R side of low back with sitting and leaning to R. He is 10 min late to appt today    Limitations  Sitting;Standing;Walking    Patient Stated Goals  Decreased pain in back.    Currently in Pain?  Yes    Pain Score  7     Pain Location  Back    Pain Orientation  Right    Pain Descriptors / Indicators  Aching;Sharp    Pain Type  Acute pain;Chronic pain    Pain Onset  More than a month ago    Pain Frequency  Intermittent  [facility] PT Treatment/Exercise - 01/02/19 0001      Self-Care   Self-Care  Posture    Posture  x15 min, education and practice for obtaining optimal seated, upright posture, practice for anterior pelivc tilts, pt requires max verbal and tactile cuing for this, and continues to ask many questions about how to "sit up straight"       Lumbar Exercises: Stretches   Active Hamstring Stretch  3 reps;30 seconds    Active Hamstring Stretch Limitations  seated    Pelvic Tilt  20 reps    Pelvic Tilt Limitations  seated for posture      Lumbar Exercises: Supine   Ab Set  10 reps    Clam  20 reps    Clam Limitations  GTB    Bent Knee Raise  20 reps    Straight Leg Raise  20 reps    Straight Leg Raises Limitations  bil      Manual Therapy   Manual Traction  Long leg distraction on R x3 min for lumbar              PT  Education - 01/02/19 0828    Education Details  Educated in great detail on obtaining upright seated posture.    Person(s) Educated  Patient    Methods  Explanation;Demonstration;Tactile cues;Verbal cues    Comprehension  Verbalized understanding;Verbal cues required;Tactile cues required;Returned demonstration;Need further instruction       PT Short Term Goals - 12/24/18 2133      PT SHORT TERM GOAL #1   Title  Pt to be independent with initial HEP    Time  2    Period  Weeks    Status  New    Target Date  01/02/19        PT Long Term Goals - 12/24/18 2133      PT LONG TERM GOAL #1   Title  Pt to be independent with final HEP for back/hip pain .    Time  6    Period  Weeks    Status  New    Target Date  01/30/19      PT LONG TERM GOAL #2   Title  Pt to report decreased pain in R side of Back/Hip to 0-3/10 with seated and standing activity.    Time  6    Period  Weeks    Status  New    Target Date  01/30/19      PT LONG TERM GOAL #3   Title  Pt to demo ability to self correct posture, to his optimal available position, to improve LBP.    Time  6    Period  Weeks    Status  New    Target Date  01/30/19      PT LONG TERM GOAL #4   Title  Pt to demo increased strength of core and LEs to at least 4+/5 to improve stability and pain.    Time  6    Period  Weeks    Status  New    Target Date  01/30/19            Plan - 01/02/19 0830    Clinical Impression Statement  Education on  posture continued, with practice for optimal seated, upright posture. Pt requires max verbal and tactile cuing for this, and continues to ask many questions about how to "sit up straight". He has fear of falling forward with attmepts for  APT in sitting, with fee resting on floor. Max tactile cues requires for performing fwd trunk lean/flexion in sitting. Progressed light stengthening for hips and core. Discussed posture and HEP with pts daughter. Pt to benefit from continued care.     Personal Factors and Comorbidities  Age;Past/Current Experience;Comorbidity 1    Comorbidities  Prev Back surgery, posture, DM, MI, Neruopathy, Memory    Examination-Activity Limitations  Bathing;Locomotion Level;Transfers;Bed Mobility;Bend;Sit;Carry;Squat;Stairs;Stand;Lift    Examination-Participation Restrictions  Meal Prep;Cleaning;Community Activity;Driving;Shop;Yard Work;Laundry;Medication Management    Stability/Clinical Decision Making  Evolving/Moderate complexity    Rehab Potential  Fair    PT Frequency  2x / week    PT Duration  6 weeks    PT Treatment/Interventions  ADLs/Self Care Home Management;Cryotherapy;Electrical Stimulation;DME Instruction;Ultrasound;Moist Heat;Iontophoresis 4mg /ml Dexamethasone;Gait training;Stair training;Functional mobility training;Therapeutic activities;Therapeutic exercise;Balance training;Neuromuscular re-education;Manual techniques;Patient/family education;Passive range of motion;Dry needling;Energy conservation;Joint Manipulations;Spinal Manipulations;Taping    Consulted and Agree with Plan of Care  Patient       Patient will benefit from skilled therapeutic intervention in order to improve the following deficits and impairments:  Abnormal gait, Decreased coordination, Decreased range of motion, Difficulty walking, Increased muscle spasms, Decreased safety awareness, Decreased endurance, Decreased activity tolerance, Decreased knowledge of precautions, Decreased skin integrity, Impaired perceived functional ability, Pain, Improper body mechanics, Impaired flexibility, Hypomobility, Decreased knowledge of use of DME, Decreased balance, Decreased cognition, Decreased mobility, Decreased strength, Increased edema, Postural dysfunction  Visit Diagnosis: Chronic right-sided low back pain with right-sided sciatica  Pain in right hip  Abnormal posture     Problem List Patient Active Problem List   Diagnosis Date Noted  . AAA (abdominal aortic aneurysm)  ([place]) 12/13/2018  . Hx of CABG 12/13/2017  . PVC's (premature ventricular contractions) 12/13/2017  . Seborrheic dermatitis 09/13/2017  . Falls frequently 08/21/2017  . Senile purpura ([place]) 08/21/2017  . Aortic root dilatation ([place]) 10/06/2016  . History of acute inferior wall MI 09/03/2015  . Osteoporosis 01/07/2015  . Anemia 09/03/2013  . Essential hypertension, benign 10/11/2012  . Foot drop, left 08/23/2012  . History of supraventricular tachycardia 01/25/2011  . Hypogonadism in male 02/04/2010  . History of colonic polyps 09/07/2009  . CAD, ARTERY BYPASS GRAFT 05/21/2009  . Carotid artery stenosis 04/20/2008  . Diabetes mellitus type II, controlled ([place]) 04/19/2007  . Hyperlipidemia 04/19/2007  . GERD 04/19/2007    Lyndee Hensen, PT, DPT 8:33 AM  01/02/19    [facility] [place], [place], [address] Phone: [phone]   Fax:  [phone]  Name: CHRISTOVAL HECKMANN MRN: [id number] Date of Birth: 1930-08-10

## 2019-01-07 ENCOUNTER — Telehealth: Payer: Self-pay | Admitting: Family Medicine

## 2019-01-07 ENCOUNTER — Encounter: Payer: Medicare Other | Admitting: Physical Therapy

## 2019-01-07 NOTE — Telephone Encounter (Signed)
I left a message asking the patient to call me at [phone] to schedule AWV with Loma Sousa after 9/24 visit. Im waiting for a call back to either confirm or decline the appointment. VDM (Dee-Dee)

## 2019-01-09 ENCOUNTER — Ambulatory Visit (INDEPENDENT_AMBULATORY_CARE_PROVIDER_SITE_OTHER): Payer: Medicare Other | Admitting: Physical Therapy

## 2019-01-09 ENCOUNTER — Ambulatory Visit (INDEPENDENT_AMBULATORY_CARE_PROVIDER_SITE_OTHER): Payer: Medicare Other

## 2019-01-09 ENCOUNTER — Encounter: Payer: Self-pay | Admitting: Physical Therapy

## 2019-01-09 ENCOUNTER — Encounter: Payer: Medicare Other | Admitting: Physical Therapy

## 2019-01-09 VITALS — BP 116/62 | Temp 97.8°F | Ht 68.0 in | Wt 164.8 lb

## 2019-01-09 DIAGNOSIS — M25551 Pain in right hip: Secondary | ICD-10-CM

## 2019-01-09 DIAGNOSIS — Z0000 Encounter for general adult medical examination without abnormal findings: Secondary | ICD-10-CM

## 2019-01-09 DIAGNOSIS — M5441 Lumbago with sciatica, right side: Secondary | ICD-10-CM

## 2019-01-09 DIAGNOSIS — R293 Abnormal posture: Secondary | ICD-10-CM | POA: Diagnosis not present

## 2019-01-09 DIAGNOSIS — G8929 Other chronic pain: Secondary | ICD-10-CM

## 2019-01-09 NOTE — Patient Instructions (Signed)
Steven [name] , Thank you for taking time to come for your Medicare Wellness Visit. I appreciate your ongoing commitment to your health goals. Please review the following plan we discussed and let me know if I can assist you in the future.   Screening recommendations/referrals: Colorectal Screening: not indicated  Vision and Dental Exams: Recommended annual ophthalmology exams for early detection of glaucoma and other disorders of the eye Recommended annual dental exams for proper oral hygiene  Diabetic Exams: Diabetic Eye Exam: up to date Diabetic Foot Exam: at next visit  Vaccinations: Influenza vaccine: completed 12/26/18 Pneumococcal vaccine: up to date; last 02/03/13 Tdap vaccine: Please call your insurance company to determine your out of pocket expense. You may also receive this vaccine at your local pharmacy or Health Dept. Shingles vaccine: Please call your insurance company to determine your out of pocket expense for the Shingrix vaccine. You may receive this vaccine at your local pharmacy.  Advanced directives: Please bring a copy of your POA (Power of Attorney) and/or Living Will to your next appointment.  Goals: Recommend to drink at least 6-8 8oz glasses of water per day.  Recommend to remove any items from the home that may cause slips or trips.  Next appointment: Please schedule your Annual Wellness Visit with your Nurse Health Advisor in one year.  Preventive Care 60 Years and Older, Male Preventive care refers to lifestyle choices and visits with your health care provider that can promote health and wellness. What does preventive care include?  A yearly physical exam. This is also called an annual well check.  Dental exams once or twice a year.  Routine eye exams. Ask your health care provider how often you should have your eyes checked.  Personal lifestyle choices, including:  Daily care of your teeth and gums.  Regular physical activity.  Eating a healthy  diet.  Avoiding tobacco and drug use.  Limiting alcohol use.  Practicing safe sex.  Taking low doses of aspirin every day if recommended by your health care provider..  Taking vitamin and mineral supplements as recommended by your health care provider. What happens during an annual well check? The services and screenings done by your health care provider during your annual well check will depend on your age, overall health, lifestyle risk factors, and family history of disease. Counseling  Your health care provider may ask you questions about your:  Alcohol use.  Tobacco use.  Drug use.  Emotional well-being.  Home and relationship well-being.  Sexual activity.  Eating habits.  History of falls.  Memory and ability to understand (cognition).  Work and work Statistician. Screening  You may have the following tests or measurements:  Height, weight, and BMI.  Blood pressure.  Lipid and cholesterol levels. These may be checked every 5 years, or more frequently if you are over 56 years old.  Skin check.  Lung cancer screening. You may have this screening every year starting at age 35 if you have a 30-pack-year history of smoking and currently smoke or have quit within the past 15 years.  Fecal occult blood test (FOBT) of the stool. You may have this test every year starting at age 63.  Flexible sigmoidoscopy or colonoscopy. You may have a sigmoidoscopy every 5 years or a colonoscopy every 10 years starting at age 66.  Prostate cancer screening. Recommendations will vary depending on your family history and other risks.  Hepatitis C blood test.  Hepatitis B blood test.  Sexually transmitted disease (STD)  testing.  Diabetes screening. This is done by checking your blood sugar (glucose) after you have not eaten for a while (fasting). You may have this done every 1-3 years.  Abdominal aortic aneurysm (AAA) screening. You may need this if you are a current or former  smoker.  Osteoporosis. You may be screened starting at age 49 if you are at high risk. Talk with your health care provider about your test results, treatment options, and if necessary, the need for more tests. Vaccines  Your health care provider may recommend certain vaccines, such as:  Influenza vaccine. This is recommended every year.  Tetanus, diphtheria, and acellular pertussis (Tdap, Td) vaccine. You may need a Td booster every 10 years.  Zoster vaccine. You may need this after age 81.  Pneumococcal 13-valent conjugate (PCV13) vaccine. One dose is recommended after age 16.  Pneumococcal polysaccharide (PPSV23) vaccine. One dose is recommended after age 39. Talk to your health care provider about which screenings and vaccines you need and how often you need them. This information is not intended to replace advice given to you by your health care provider. Make sure you discuss any questions you have with your health care provider. Document Released: 04/30/2015 Document Revised: 12/22/2015 Document Reviewed: 02/02/2015 Elsevier Interactive Patient Education  2017 [employer] Prevention in the Home Falls can cause injuries. They can happen to people of all ages. There are many things you can do to make your home safe and to help prevent falls. What can I do on the outside of my home?  Regularly fix the edges of walkways and driveways and fix any cracks.  Remove anything that might make you trip as you walk through a door, such as a raised step or threshold.  Trim any bushes or trees on the path to your home.  Use bright outdoor lighting.  Clear any walking paths of anything that might make someone trip, such as rocks or tools.  Regularly check to see if handrails are loose or broken. Make sure that both sides of any steps have handrails.  Any raised decks and porches should have guardrails on the edges.  Have any leaves, snow, or ice cleared regularly.  Use sand or  salt on walking paths during winter.  Clean up any spills in your garage right away. This includes oil or grease spills. What can I do in the bathroom?  Use night lights.  Install grab bars by the toilet and in the tub and shower. Do not use towel bars as grab bars.  Use non-skid mats or decals in the tub or shower.  If you need to sit down in the shower, use a plastic, non-slip stool.  Keep the floor dry. Clean up any water that spills on the floor as soon as it happens.  Remove soap buildup in the tub or shower regularly.  Attach bath mats securely with double-sided non-slip rug tape.  Do not have throw rugs and other things on the floor that can make you trip. What can I do in the bedroom?  Use night lights.  Make sure that you have a light by your bed that is easy to reach.  Do not use any sheets or blankets that are too big for your bed. They should not hang down onto the floor.  Have a firm chair that has side arms. You can use this for support while you get dressed.  Do not have throw rugs and other things on the floor  that can make you trip. What can I do in the kitchen?  Clean up any spills right away.  Avoid walking on wet floors.  Keep items that you use a lot in easy-to-reach places.  If you need to reach something above you, use a strong step stool that has a grab bar.  Keep electrical cords out of the way.  Do not use floor polish or wax that makes floors slippery. If you must use wax, use non-skid floor wax.  Do not have throw rugs and other things on the floor that can make you trip. What can I do with my stairs?  Do not leave any items on the stairs.  Make sure that there are handrails on both sides of the stairs and use them. Fix handrails that are broken or loose. Make sure that handrails are as long as the stairways.  Check any carpeting to make sure that it is firmly attached to the stairs. Fix any carpet that is loose or worn.  Avoid having  throw rugs at the top or bottom of the stairs. If you do have throw rugs, attach them to the floor with carpet tape.  Make sure that you have a light switch at the top of the stairs and the bottom of the stairs. If you do not have them, ask someone to add them for you. What else can I do to help prevent falls?  Wear shoes that:  Do not have high heels.  Have rubber bottoms.  Are comfortable and fit you well.  Are closed at the toe. Do not wear sandals.  If you use a stepladder:  Make sure that it is fully opened. Do not climb a closed stepladder.  Make sure that both sides of the stepladder are locked into place.  Ask someone to hold it for you, if possible.  Clearly mark and make sure that you can see:  Any grab bars or handrails.  First and last steps.  Where the edge of each step is.  Use tools that help you move around (mobility aids) if they are needed. These include:  Canes.  Walkers.  Scooters.  Crutches.  Turn on the lights when you go into a dark area. Replace any light bulbs as soon as they burn out.  Set up your furniture so you have a clear path. Avoid moving your furniture around.  If any of your floors are uneven, fix them.  If there are any pets around you, be aware of where they are.  Review your medicines with your doctor. Some medicines can make you feel dizzy. This can increase your chance of falling. Ask your doctor what other things that you can do to help prevent falls. This information is not intended to replace advice given to you by your health care provider. Make sure you discuss any questions you have with your health care provider. Document Released: 01/28/2009 Document Revised: 09/09/2015 Document Reviewed: 05/08/2014 Elsevier Interactive Patient Education  2017 [employer].

## 2019-01-09 NOTE — Progress Notes (Signed)
Subjective:   Steven [name] is a 85 y.o. male who presents for Medicare Annual/Subsequent preventive examination.  Review of Systems:   Cardiac Risk Factors include: advanced age (>7men, >42 women);diabetes mellitus;hypertension;male gender;dyslipidemia     Objective:    Vitals: BP 116/62 (BP Location: Left Arm, Patient Position: Sitting, Cuff Size: Normal)   Temp 97.8 F (36.6 C) (Temporal)   Ht 5\' 8"  (1.727 m)   Wt 164 lb 12.8 oz (74.8 kg)   BMI 25.06 kg/m   Body mass index is 25.06 kg/m.  Advanced Directives 01/09/2019 01/09/2019 12/24/2018 09/16/2012 09/03/2012 01/19/2012 01/15/2012  Does Patient Have a Medical Advance Directive? No No No Patient has advance directive, copy not in chart Patient has advance directive, copy not in chart Patient has advance directive, copy not in chart Patient has advance directive, copy in chart  Type of Advance Directive - - - [place];Living will [place];Living will Living will Living will  Copy of [place] in Chart? - - - Copy requested from family Copy requested from family - -  Would patient like information on creating a medical advance directive? Yes (MAU/Ambulatory/Procedural Areas - Information given) Yes (MAU/Ambulatory/Procedural Areas - Information given) No - Patient declined - - - -  Pre-existing out of facility DNR order (yellow form or pink MOST form) - - - No No No No    Tobacco Social History   Tobacco Use  Smoking Status Never Smoker  Smokeless Tobacco Never Used     Counseling given: Not Answered   Clinical Intake:  Pre-visit preparation completed: Yes  Interpreter Needed?: No  Information entered by :: Denman George LPN  Past Medical History:  Diagnosis Date  . Abnormality of gait 08/23/2012  . Anemia    hx of  . Arthritis 01-03-12   osteoarthritis-knees  . CAD (coronary artery disease) 2004   s/p inferior wall Mi 2004 with PTCA/Stent; s/p CABG 2004   . Degenerative arthritis   . Diabetes mellitus    type II  . Foot drop, bilateral 08/23/2012  . GERD (gastroesophageal reflux disease)   . Heart disease   . History of kidney stones   . History of myocardial infarction   . Hyperlipidemia   . Myocardial infarction [facility]) 01-03-12   '04  . MYOCARDIAL INFARCTION, HX OF 04/19/2007   Qualifier: Diagnosis of  By: Scherrie Gerlach    . Neuropathy 01-03-12   bil. feet  . Pneumonia    hx of  . Polyneuropathy in diabetes(357.2) 08/23/2012  . Radiculopathy of lumbar region 08/23/2011   Followed by Dr. Vertell Limber. S/p surgery. Resolved after surgery.     Past Surgical History:  Procedure Laterality Date  . BACK SURGERY  2013  . CARDIAC CATHETERIZATION  01-03-12   '04  . CATARACT EXTRACTION Bilateral 2015  . COLONOSCOPY    . CORONARY ARTERY BYPASS GRAFT  2004   LIMA to LAD; SVG to ramus intermedius; SVG to PDA/PLSA  . LAMINECTOMY  09/08/2011   Procedure: LUMBAR LAMINECTOMY FOR TUMOR;  Surgeon: Erline Levine, MD;  Location: [facility];  Service: Neurosurgery;  Laterality: Left;  Left Thoracic twelve-Lumbar one transpedicular resection of epidural mass  . TONSILLECTOMY  as child  . TOTAL KNEE ARTHROPLASTY  01/15/2012   Procedure: TOTAL KNEE ARTHROPLASTY;  Surgeon: Gearlean Alf, MD;  Location: [facility];  Service: Orthopedics;  Laterality: Right;  . TOTAL KNEE ARTHROPLASTY Left 09/16/2012   Procedure: LEFT TOTAL KNEE  ARTHROPLASTY;  Surgeon: Gearlean Alf, MD;  Location: [facility];  Service: Orthopedics;  Laterality: Left;   Family History  Problem Relation Age of Onset  . Cirrhosis Mother        etiology unclear  . Hypertension Father   . Stroke Father   . Cancer Sister        breast  . Stroke Maternal Grandmother   . Heart disease Maternal Grandfather        MI  . Stroke Paternal Grandmother   . Stroke Paternal Grandfather   . Anesthesia problems Neg Hx   . Hypotension Neg Hx   . Malignant hyperthermia Neg Hx   . Pseudochol deficiency Neg Hx     Social History   Socioeconomic History  . Marital status: Married    Spouse name: Not on file  . Number of children: 2  . Years of education: College  . Highest education level: Not on file  Occupational History  . Occupation: Research scientist (life sciences)  Social Needs  . Financial resource strain: Not on file  . Food insecurity    Worry: Not on file    Inability: Not on file  . Transportation needs    Medical: Not on file    Non-medical: Not on file  Tobacco Use  . Smoking status: Never Smoker  . Smokeless tobacco: Never Used  Substance and Sexual Activity  . Alcohol use: No  . Drug use: No  . Sexual activity: Not Currently  Lifestyle  . Physical activity    Days per week: Not on file    Minutes per session: Not on file  . Stress: Not on file  Relationships  . Social Herbalist on phone: Not on file    Gets together: Not on file    Attends religious service: Not on file    Active member of club or organization: Not on file    Attends meetings of clubs or organizations: Not on file    Relationship status: Not on file  Other Topics Concern  . Not on file  Social History Narrative   Married 1956.  2 children. No grandkids. Neither children married.    1 daughter lives at house and watches over parents.       Retired from [employer]. Worked for hisself afterwards.       Regular exercise: no   Daily Caffeine: 3 cups coffee daily    Outpatient Encounter Medications as of 01/09/2019  Medication Sig  . acetaminophen-codeine (TYLENOL #3) 300-30 MG tablet Take 1 tablet by mouth every 6 (six) hours as needed for severe pain.  Marland Kitchen alendronate (FOSAMAX) 70 MG tablet TAKE 1 TABLET BY MOUTH EVERY 7 DAYS WITH A FULL GLASS OF WATER ON AN EMPTY STOMACH  . aspirin 81 MG tablet Take 81 mg by mouth daily.  Marland Kitchen atorvastatin (LIPITOR) 80 MG tablet TAKE 1 TABLET DAILY. NEEDS APPOINTMENT BEFORE NEXT    REFILL  . cycloSPORINE (RESTASIS) 0.05 % ophthalmic emulsion 1 drop 2  (two) times daily.  . diclofenac sodium (VOLTAREN) 1 % GEL Apply 4 g topically 4 (four) times daily.  Marland Kitchen diltiazem (MATZIM LA) 180 MG 24 hr tablet Take 1 tablet (180 mg total) by mouth daily.  Marland Kitchen glimepiride (AMARYL) 1 MG tablet TAKE 1 TABLET (1 MG TOTAL) BY MOUTH DAILY WITH BREAKFAST.  Marland Kitchen glucose blood test strip Use to test your blood sugar daily. E11.9 One touch Verio test strips  . ibuprofen (ADVIL) 200 MG  tablet Take 200 mg by mouth every 6 (six) hours as needed.  Marland Kitchen ketoconazole (NIZORAL) 2 % shampoo APPLY 2 TIMES A WEEK  . Lancets (ONETOUCH ULTRASOFT) lancets USE TO TEST BLOOD SUGARS DAILY.  . metFORMIN (GLUCOPHAGE) 1000 MG tablet Take 1 tablet (1,000 mg total) by mouth 2 (two) times daily with a meal.  . metoprolol succinate (TOPROL-XL) 50 MG 24 hr tablet TAKE 1 TABLET BY MOUTH EVERY DAY  . omeprazole (PRILOSEC) 20 MG capsule Take 1 capsule (20 mg total) by mouth daily.  Glory Rosebush VERIO test strip USE TO TEST BLOOD SUGARS DAILY.  . [DISCONTINUED] testosterone cypionate (DEPO-TESTOSTERONE) 200 MG/ML injection Inject 1 mL (200 mg total) into the muscle every 30 (thirty) days.   No facility-administered encounter medications on file as of 01/09/2019.     Activities of Daily Living In your present state of health, do you have any difficulty performing the following activities: 01/09/2019  Hearing? Y  Vision? N  Difficulty concentrating or making decisions? N  Walking or climbing stairs? Y  Dressing or bathing? N  Doing errands, shopping? Y  Preparing Food and eating ? N  Using the Toilet? N  In the past six months, have you accidently leaked urine? N  Do you have problems with loss of bowel control? N  Managing your Medications? N  Managing your Finances? N  Housekeeping or managing your Housekeeping? N  Comment assisted by daughter that lives in home  Some recent data might be hidden    Patient Care Team: Marin Olp, MD as PCP - General (Family Medicine) Erline Levine, MD  as Consulting Physician (Neurosurgery) Debara Pickett Nadean Corwin, MD as Consulting Physician (Cardiology)   Assessment:   This is a routine wellness examination for [place].  Exercise Activities and Dietary recommendations Current Exercise Habits: The patient does not participate in regular exercise at present  Goals    . patient     Alzheimer's Association / Family information and training  https://www.clark-whitaker.org/.asp Loss adjuster, chartered for information  Driving resource center; Can send out driving contract   SAFETY; TeleconferenceOnDemand.fr.asp#howdementiaaffects  [facility]  (please mail donations to this address) [address], [address]  [place], [place] [address] P: [phone] F: [place], [place] [address] P: [phone] F: [address], [id number] [place], [place] [address] P: [phone] F: [phone]  Families to call the 800 number to get information on all the resources in the area 24 hour 1 [phone]  Can provide resources; Questions about Dementia; Support groups; Give the caregiver information regarding respite ([facility]) Call the [place] on Aging;as they oversee who gets the grant fund for certain areas (123XX123) Also have Tools to help navigate the needs of the patient  Opportunities for free educational programs online 100 support groups [place];  Just starting Early stage program and care partners for the patient and the family 12 weeks of a support group;  12 weeks of social engagement component End with Educational wrap up [place] the 800 number given for more information  All's connected  Navigator for those more comfortable with navigating the internet and allows one to develop a plan based on resources;  Website and review the educational calander for programs that are available.   They do care consultations with appointments  Packets for physician offices/  Packets physicians can give to newly dx patients Early stage flyers  Also have examples of Safety services and jewelry             .  Patient center      Will get your estate in order Will call a friend in the insurance; and go over where you are and what you need to do with [place] Need update will HCPOA Determine where to put in investments; trust          Fall Risk Fall Risk  01/09/2019 11/28/2018 03/29/2017 03/28/2016 03/24/2016  Falls in the past year? 0 1 No No No  Comment - - - - Emmi Telephone Survey: data to providers prior to load  Number falls in past yr: 0 0 - - -  Injury with Fall? 0 1 - - -  Risk for fall due to : History of fall(s);Impaired balance/gait;Impaired mobility Impaired balance/gait - - -  Follow up Education provided;Falls prevention discussed;Falls evaluation completed - - - -   Is the patient's home free of loose throw rugs in walkways, pet beds, electrical cords, etc?   yes      Grab bars in the bathroom? yes      Handrails on the stairs?   yes      Adequate lighting?   yes  Depression Screen PHQ 2/9 Scores 01/09/2019 11/28/2018 03/29/2017 05/02/2016  PHQ - 2 Score 0 0 0 0    Cognitive Function MMSE - Mini Mental State Exam 05/02/2016  Not completed: (No Data)     6CIT Screen 01/09/2019  What Year? 0 points  What month? 0 points  What time? 0 points  Count back from 20 0 points  Months in reverse 0 points  Repeat phrase 4 points  Total Score 4    Immunization History  Administered Date(s) Administered  . Fluad Quad(high Dose 65+) 12/26/2018  . Influenza Split 01/25/2011, 01/16/2012  . Influenza Whole 04/26/2007, 01/27/2008, 02/09/2009, 01/18/2010  . Influenza, High Dose Seasonal PF 01/06/2016, 02/07/2017, 01/30/2018  . Influenza,inj,Quad PF,6+ Mos 12/30/2012, 01/15/2014, 12/25/2014  . Pneumococcal Conjugate-13 02/03/2013  . Pneumococcal  Polysaccharide-23 02/25/1998, 01/25/2011  . Td 02/25/1998, 12/22/2008  . Zoster 01/27/2008    Qualifies for Shingles Vaccine? Discussed and patient will check with pharmacy for coverage.  Patient education handout provided     Screening Tests Health Maintenance  Topic Date Due  . FOOT EXAM  03/29/2018  . TETANUS/TDAP  12/23/2018  . HEMOGLOBIN A1C  05/31/2019  . URINE MICROALBUMIN  11/28/2019  . OPHTHALMOLOGY EXAM  12/04/2019  . INFLUENZA VACCINE  Completed  . PNA vac Low Risk Adult  Completed   Cancer Screenings: Lung: Low Dose CT Chest recommended if Age 78-80 years, 30 pack-year currently smoking OR have quit w/in 15years. Patient does not qualify. Colorectal: not indicated; last colonoscopy 07/21/09      Plan:  I have personally reviewed and addressed the Medicare Annual Wellness questionnaire and have noted the following in the patient's chart:  A. Medical and social history B. Use of alcohol, tobacco or illicit drugs  C. Current medications and supplements D. Functional ability and status E.  Nutritional status F.  Physical activity G. Advance directives H. List of other physicians I.  Hospitalizations, surgeries, and ER visits in previous 12 months J.  [place] such as hearing and vision if needed, cognitive and depression L. Referrals, records requested, and appointments- none   In addition, I have reviewed and discussed with patient certain preventive protocols, quality metrics, and best practice recommendations. A written personalized care plan for preventive services as well as general preventive health recommendations were provided to patient.   Signed,  Denman George, LPN  Nurse Health Advisor   Nurse Notes: no additional

## 2019-01-09 NOTE — Therapy (Signed)
[facility] [address] [place], [place], [address] Phone: [phone]   Fax:  [phone]  Physical Therapy Treatment  Patient Details  Name: Steven [name] MRN: [id number] Date of Birth: [birth year] Referring Provider (PT): Orma Flaming   Encounter Date: 01/09/2019  PT End of Session - 01/09/19 1356    Visit Number  4    Number of Visits  12    Date for PT Re-Evaluation  01/30/19    Authorization Type  [employer] Medicare    PT Start Time  [id number]    PT Stop Time  1345    PT Time Calculation (min)  40 min    Activity Tolerance  Patient tolerated treatment well    Behavior During Therapy  [facility] for tasks assessed/performed;Impulsive       Past Medical History:  Diagnosis Date  . Abnormality of gait 08/23/2012  . Anemia    hx of  . Arthritis 01-03-12   osteoarthritis-knees  . CAD (coronary artery disease) 2004   s/p inferior wall Mi 2004 with PTCA/Stent; s/p CABG 2004  . Degenerative arthritis   . Diabetes mellitus    type II  . Foot drop, bilateral 08/23/2012  . GERD (gastroesophageal reflux disease)   . Heart disease   . History of kidney stones   . History of myocardial infarction   . Hyperlipidemia   . Myocardial infarction [facility]) 01-03-12   '04  . MYOCARDIAL INFARCTION, HX OF 04/19/2007   Qualifier: Diagnosis of  By: Scherrie Gerlach    . Neuropathy 01-03-12   bil. feet  . Pneumonia    hx of  . Polyneuropathy in diabetes(357.2) 08/23/2012  . Radiculopathy of lumbar region 08/23/2011   Followed by Dr. Vertell Limber. S/p surgery. Resolved after surgery.      Past Surgical History:  Procedure Laterality Date  . BACK SURGERY  2013  . CARDIAC CATHETERIZATION  01-03-12   '04  . CATARACT EXTRACTION Bilateral 2015  . COLONOSCOPY    . CORONARY ARTERY BYPASS GRAFT  2004   LIMA to LAD; SVG to ramus intermedius; SVG to PDA/PLSA  . LAMINECTOMY  09/08/2011   Procedure: LUMBAR LAMINECTOMY FOR TUMOR;  Surgeon: Erline Levine, MD;  Location: [facility];  Service: Neurosurgery;  Laterality: Left;  Left Thoracic twelve-Lumbar one transpedicular resection of epidural mass  . TONSILLECTOMY  as child  . TOTAL KNEE ARTHROPLASTY  01/15/2012   Procedure: TOTAL KNEE ARTHROPLASTY;  Surgeon: Gearlean Alf, MD;  Location: [facility];  Service: Orthopedics;  Laterality: Right;  . TOTAL KNEE ARTHROPLASTY Left 09/16/2012   Procedure: LEFT TOTAL KNEE ARTHROPLASTY;  Surgeon: Gearlean Alf, MD;  Location: [facility];  Service: Orthopedics;  Laterality: Left;    There were no vitals filed for this visit.  Subjective Assessment - 01/09/19 1355    Subjective  Pt states his back and hips still hurt    Currently in Pain?  Yes    Pain Score  7     Pain Location  Back    Pain Orientation  Right;Left    Pain Descriptors / Indicators  Aching                       [facility] Adult PT Treatment/Exercise - 01/09/19 0001      Ambulation/Gait   Gait Comments  35 ft x 6, with verbal cueing for upright posture and safe distance from RW.      Self-Care   Self-Care  Posture    Posture  x10 min, education and practice for seated and standing posture      Lumbar Exercises: Stretches   Active Hamstring Stretch Limitations  manual    Single Knee to Chest Stretch  3 reps;30 seconds;Right    Pelvic Tilt  20 reps    Pelvic Tilt Limitations  seated for posture, with therapist assist from strap at pelvis      Lumbar Exercises: Standing   Functional Squats  10 reps    Row  20 reps    Theraband Level (Row)  Level 3 (Green)    Row Limitations  x10 practice without band     Other Standing Lumbar Exercises  Hip abd 2x10 bil;       Lumbar Exercises: Supine   Ab Set  --    Clam  --    Clam Limitations  --    Bent Knee Raise  --    Straight Leg Raise  10 reps    Straight Leg Raises Limitations  bil      Manual Therapy   Manual Traction  Long leg distraction on R x3 min for lumbar              PT Education - 01/09/19 1356    Education Details   Education on seated and standing posture, seated fwd trunk lean/hip hinge for transfers, mechanics for sit to stand transfer    Person(s) Educated  Patient    Methods  Explanation;Demonstration;Tactile cues;Verbal cues    Comprehension  Verbalized understanding;Returned demonstration;Need further instruction       PT Short Term Goals - 12/24/18 2133      PT SHORT TERM GOAL #1   Title  Pt to be independent with initial HEP    Time  2    Period  Weeks    Status  New    Target Date  01/02/19        PT Long Term Goals - 12/24/18 2133      PT LONG TERM GOAL #1   Title  Pt to be independent with final HEP for back/hip pain .    Time  6    Period  Weeks    Status  New    Target Date  01/30/19      PT LONG TERM GOAL #2   Title  Pt to report decreased pain in R side of Back/Hip to 0-3/10 with seated and standing activity.    Time  6    Period  Weeks    Status  New    Target Date  01/30/19      PT LONG TERM GOAL #3   Title  Pt to demo ability to self correct posture, to his optimal available position, to improve LBP.    Time  6    Period  Weeks    Status  New    Target Date  01/30/19      PT LONG TERM GOAL #4   Title  Pt to demo increased strength of core and LEs to at least 4+/5 to improve stability and pain.    Time  6    Period  Weeks    Status  New    Target Date  01/30/19            Plan - 01/09/19 1357    Clinical Impression Statement  Pt requires simple directions/instructions for posture and ther ex. Pt with slight improved ability for upright posture in standing  and sitting today. Fearful of fwd trunk lean in sitting, Requires tactile cues and physical assist to perform. Pt able to progress ther ex today.    Personal Factors and Comorbidities  Age;Past/Current Experience;Comorbidity 1    Comorbidities  Prev Back surgery, posture, DM, MI, Neruopathy, Memory    Examination-Activity Limitations  Bathing;Locomotion Level;Transfers;Bed  Mobility;Bend;Sit;Carry;Squat;Stairs;Stand;Lift    Examination-Participation Restrictions  Meal Prep;Cleaning;Community Activity;Driving;Shop;Yard Work;Laundry;Medication Management    Stability/Clinical Decision Making  Evolving/Moderate complexity    Rehab Potential  Fair    PT Frequency  2x / week    PT Duration  6 weeks    PT Treatment/Interventions  ADLs/Self Care Home Management;Cryotherapy;Electrical Stimulation;DME Instruction;Ultrasound;Moist Heat;Iontophoresis 4mg /ml Dexamethasone;Gait training;Stair training;Functional mobility training;Therapeutic activities;Therapeutic exercise;Balance training;Neuromuscular re-education;Manual techniques;Patient/family education;Passive range of motion;Dry needling;Energy conservation;Joint Manipulations;Spinal Manipulations;Taping    Consulted and Agree with Plan of Care  Patient       Patient will benefit from skilled therapeutic intervention in order to improve the following deficits and impairments:  Abnormal gait, Decreased coordination, Decreased range of motion, Difficulty walking, Increased muscle spasms, Decreased safety awareness, Decreased endurance, Decreased activity tolerance, Decreased knowledge of precautions, Decreased skin integrity, Impaired perceived functional ability, Pain, Improper body mechanics, Impaired flexibility, Hypomobility, Decreased knowledge of use of DME, Decreased balance, Decreased cognition, Decreased mobility, Decreased strength, Increased edema, Postural dysfunction  Visit Diagnosis: Chronic right-sided low back pain with right-sided sciatica  Pain in right hip  Abnormal posture     Problem List Patient Active Problem List   Diagnosis Date Noted  . AAA (abdominal aortic aneurysm) ([place]) 12/13/2018  . Hx of CABG 12/13/2017  . PVC's (premature ventricular contractions) 12/13/2017  . Seborrheic dermatitis 09/13/2017  . Falls frequently 08/21/2017  . Senile purpura ([place]) 08/21/2017  . Aortic root  dilatation ([place]) 10/06/2016  . History of acute inferior wall MI 09/03/2015  . Osteoporosis 01/07/2015  . Anemia 09/03/2013  . Essential hypertension, benign 10/11/2012  . Foot drop, left 08/23/2012  . History of supraventricular tachycardia 01/25/2011  . Hypogonadism in male 02/04/2010  . History of colonic polyps 09/07/2009  . CAD, ARTERY BYPASS GRAFT 05/21/2009  . Carotid artery stenosis 04/20/2008  . Diabetes mellitus type II, controlled ([place]) 04/19/2007  . Hyperlipidemia 04/19/2007  . GERD 04/19/2007    Lyndee Hensen, PT, DPT 1:59 PM  01/09/19    [place], [place], [address] Phone: [phone]   Fax:  [phone]  Name: Steven [name] MRN: [id number] Date of Birth: 1931-03-13

## 2019-01-09 NOTE — Progress Notes (Signed)
I have reviewed and agree with note, evaluation, plan.   Stephen Hunter, MD  

## 2019-01-14 ENCOUNTER — Ambulatory Visit (INDEPENDENT_AMBULATORY_CARE_PROVIDER_SITE_OTHER): Payer: Medicare Other | Admitting: Physical Therapy

## 2019-01-14 ENCOUNTER — Encounter: Payer: Self-pay | Admitting: Physical Therapy

## 2019-01-14 ENCOUNTER — Other Ambulatory Visit: Payer: Self-pay | Admitting: Family Medicine

## 2019-01-14 ENCOUNTER — Telehealth: Payer: Self-pay | Admitting: Physical Therapy

## 2019-01-14 ENCOUNTER — Other Ambulatory Visit: Payer: Self-pay

## 2019-01-14 DIAGNOSIS — M5441 Lumbago with sciatica, right side: Secondary | ICD-10-CM | POA: Diagnosis not present

## 2019-01-14 DIAGNOSIS — G8929 Other chronic pain: Secondary | ICD-10-CM

## 2019-01-14 DIAGNOSIS — M25551 Pain in right hip: Secondary | ICD-10-CM

## 2019-01-14 DIAGNOSIS — R293 Abnormal posture: Secondary | ICD-10-CM

## 2019-01-14 NOTE — Telephone Encounter (Signed)
ATTN: Lyndee Hensen: Caller States he cannot workout any longer.  PER [facility]

## 2019-01-14 NOTE — Therapy (Signed)
[facility] [address] [place], [place], [address] Phone: [phone]   Fax:  [phone]  Physical Therapy Treatment  Patient Details  Name: Steven [name] MRN: [id number] Date of Birth: 1930/05/30 Referring Provider (PT): Orma Flaming   Encounter Date: 01/14/2019  PT End of Session - 01/14/19 1435    Visit Number  5    Number of Visits  12    Date for PT Re-Evaluation  01/30/19    Authorization Type  [employer] Medicare    PT Start Time  [id number]    PT Stop Time  1513    PT Time Calculation (min)  40 min    Activity Tolerance  Patient tolerated treatment well    Behavior During Therapy  [facility] for tasks assessed/performed;Impulsive       Past Medical History:  Diagnosis Date  . Abnormality of gait 08/23/2012  . Anemia    hx of  . Arthritis 01-03-12   osteoarthritis-knees  . CAD (coronary artery disease) 2004   s/p inferior wall Mi 2004 with PTCA/Stent; s/p CABG 2004  . Degenerative arthritis   . Diabetes mellitus    type II  . Foot drop, bilateral 08/23/2012  . GERD (gastroesophageal reflux disease)   . Heart disease   . History of kidney stones   . History of myocardial infarction   . Hyperlipidemia   . Myocardial infarction [facility]) 01-03-12   '04  . MYOCARDIAL INFARCTION, HX OF 04/19/2007   Qualifier: Diagnosis of  By: Scherrie Gerlach    . Neuropathy 01-03-12   bil. feet  . Pneumonia    hx of  . Polyneuropathy in diabetes(357.2) 08/23/2012  . Radiculopathy of lumbar region 08/23/2011   Followed by Dr. Vertell Limber. S/p surgery. Resolved after surgery.      Past Surgical History:  Procedure Laterality Date  . BACK SURGERY  2013  . CARDIAC CATHETERIZATION  01-03-12   '04  . CATARACT EXTRACTION Bilateral 2015  . COLONOSCOPY    . CORONARY ARTERY BYPASS GRAFT  2004   LIMA to LAD; SVG to ramus intermedius; SVG to PDA/PLSA  . LAMINECTOMY  09/08/2011   Procedure: LUMBAR LAMINECTOMY FOR TUMOR;  Surgeon: Erline Levine, MD;  Location: [facility];  Service: Neurosurgery;  Laterality: Left;  Left Thoracic twelve-Lumbar one transpedicular resection of epidural mass  . TONSILLECTOMY  as child  . TOTAL KNEE ARTHROPLASTY  01/15/2012   Procedure: TOTAL KNEE ARTHROPLASTY;  Surgeon: Gearlean Alf, MD;  [facility]: [facility];  Service: Orthopedics;  Laterality: Right;  . TOTAL KNEE ARTHROPLASTY Left 09/16/2012   Procedure: LEFT TOTAL KNEE ARTHROPLASTY;  Surgeon: Gearlean Alf, MD;  Location: [facility];  Service: Orthopedics;  Laterality: Left;    There were no vitals filed for this visit.  Subjective Assessment - 01/14/19 1435    Subjective  Pt states his back and R LE today, states increased soreness.    Patient Stated Goals  Decreased pain in back.    Currently in Pain?  Yes    Pain Score  8     Pain Location  Back    Pain Orientation  Right;Left    Pain Descriptors / Indicators  Aching    Pain Type  Acute pain    Pain Onset  More than a month ago    Pain Frequency  Intermittent                       [facility] Adult  PT Treatment/Exercise - 01/14/19 1438      Ambulation/Gait   Gait Comments  35 ft x 8, with verbal cueing for upright posture and safe distance from RW.      Self-Care   Self-Care  --    Posture  --      Lumbar Exercises: Stretches   Active Hamstring Stretch Limitations  manual    Single Knee to Chest Stretch  3 reps;30 seconds;Right    Pelvic Tilt  20 reps    Pelvic Tilt Limitations  supine      Lumbar Exercises: Standing   Functional Squats  --    Row  20 reps    Theraband Level (Row)  Level 3 (Green)    Other Standing Lumbar Exercises  Hip abd 2x10 bil;       Lumbar Exercises: Supine   Pelvic Tilt  --    Bent Knee Raise  20 reps    Straight Leg Raise  10 reps    Straight Leg Raises Limitations  bil      Manual Therapy   Manual Traction  Long leg distraction on R x3 min for lumbar                PT Short Term Goals - 12/24/18 2133      PT SHORT TERM GOAL #1   Title  Pt to be  independent with initial HEP    Time  2    Period  Weeks    Status  New    Target Date  01/02/19        PT Long Term Goals - 12/24/18 2133      PT LONG TERM GOAL #1   Title  Pt to be independent with final HEP for back/hip pain .    Time  6    Period  Weeks    Status  New    Target Date  01/30/19      PT LONG TERM GOAL #2   Title  Pt to report decreased pain in R side of Back/Hip to 0-3/10 with seated and standing activity.    Time  6    Period  Weeks    Status  New    Target Date  01/30/19      PT LONG TERM GOAL #3   Title  Pt to demo ability to self correct posture, to his optimal available position, to improve LBP.    Time  6    Period  Weeks    Status  New    Target Date  01/30/19      PT LONG TERM GOAL #4   Title  Pt to demo increased strength of core and LEs to at least 4+/5 to improve stability and pain.    Time  6    Period  Weeks    Status  New    Target Date  01/30/19            Plan - 01/14/19 1540    Clinical Impression Statement  Pt continues to compain of significant pain in low back. Some limitation with progress due to memory, and likely not doing HEP or remembering posture cues. Discussed seeing pt for 2 more visits, then may refer to ortho/spine to see if there may be other pain relief options for him.    Personal Factors and Comorbidities  Age;Past/Current Experience;Comorbidity 1    Comorbidities  Prev Back surgery, posture, DM, MI, Neruopathy, Memory    Examination-Activity Limitations  Bathing;Locomotion Level;Transfers;Bed Mobility;Bend;Sit;Carry;Squat;Stairs;Stand;Lift    Examination-Participation Restrictions  Meal Prep;Cleaning;Community Activity;Driving;Shop;Yard Work;Laundry;Medication Management    Stability/Clinical Decision Making  Evolving/Moderate complexity    Rehab Potential  Fair    PT Frequency  2x / week    PT Duration  6 weeks    PT Treatment/Interventions  ADLs/Self Care Home Management;Cryotherapy;Electrical  Stimulation;DME Instruction;Ultrasound;Moist Heat;Iontophoresis 4mg /ml Dexamethasone;Gait training;Stair training;Functional mobility training;Therapeutic activities;Therapeutic exercise;Balance training;Neuromuscular re-education;Manual techniques;Patient/family education;Passive range of motion;Dry needling;Energy conservation;Joint Manipulations;Spinal Manipulations;Taping    Consulted and Agree with Plan of Care  Patient       Patient will benefit from skilled therapeutic intervention in order to improve the following deficits and impairments:  Abnormal gait, Decreased coordination, Decreased range of motion, Difficulty walking, Increased muscle spasms, Decreased safety awareness, Decreased endurance, Decreased activity tolerance, Decreased knowledge of precautions, Decreased skin integrity, Impaired perceived functional ability, Pain, Improper body mechanics, Impaired flexibility, Hypomobility, Decreased knowledge of use of DME, Decreased balance, Decreased cognition, Decreased mobility, Decreased strength, Increased edema, Postural dysfunction  Visit Diagnosis: Chronic right-sided low back pain with right-sided sciatica  Pain in right hip  Abnormal posture     Problem List Patient Active Problem List   Diagnosis Date Noted  . AAA (abdominal aortic aneurysm) ([place]) 12/13/2018  . Hx of CABG 12/13/2017  . PVC's (premature ventricular contractions) 12/13/2017  . Seborrheic dermatitis 09/13/2017  . Falls frequently 08/21/2017  . Senile purpura ([place]) 08/21/2017  . Aortic root dilatation ([place]) 10/06/2016  . History of acute inferior wall MI 09/03/2015  . Osteoporosis 01/07/2015  . Anemia 09/03/2013  . Essential hypertension, benign 10/11/2012  . Foot drop, left 08/23/2012  . History of supraventricular tachycardia 01/25/2011  . Hypogonadism in male 02/04/2010  . History of colonic polyps 09/07/2009  . CAD, ARTERY BYPASS GRAFT 05/21/2009  . Carotid artery stenosis 04/20/2008  .  Diabetes mellitus type II, controlled ([place]) 04/19/2007  . Hyperlipidemia 04/19/2007  . GERD 04/19/2007   Lyndee Hensen, PT, DPT 3:43 PM  01/14/19    [facility] [place], [place], [address] Phone: [phone]   Fax:  [phone]  Name: Steven [name] MRN: [id number] Date of Birth: [birth year]

## 2019-01-20 NOTE — Patient Instructions (Addendum)
Health Maintenance Due  Topic Date Due  . TETANUS/TDAP -check with pharmacist about tetanus shot- should be much cheaper there 12/23/2018   We cancelled a1c as had just had in august- opted to push labs out 6 months  On fosamax should be on 1000 units of vitamin D per day and 1200mg  of calcium per day (some can be through diet and some through a pill- the calcium through the diet is preferred for heart issues)  I like that you are trying to avoid omeprazole if you can- its ok to take this sparingly but you can take if you need it  Talk to Dr. Debara Pickett at follow up if you need another echocardiogram- if he does not order one at follow up we may consider this next visit  Blood pressure slightly high today- lets have you check at home once a day for next two weeks and then drop off those #s by our office - with your name and date of birth listed. Consider omron series 3 cuff on [employer] or [employer]- should be around $33. Goal <140/90 on average. Prefer 130/80 as long as you dont feel lightheaded/dizzy

## 2019-01-20 NOTE — Progress Notes (Signed)
Phone [phone]   Subjective:  Steven [name] is a 85 y.o. year old very pleasant male patient who presents for/with See problem oriented charting Chief Complaint  Patient presents with  . Follow-up  . Diabetes   ROS- No chest pain or shortness of breath. No headache or blurry vision. Feels like he is slowing down some- using walker regularly   Past Medical History-  Patient Active Problem List   Diagnosis Date Noted  . AAA (abdominal aortic aneurysm) ([place]) 12/13/2018    Priority: High  . Falls frequently 08/21/2017    Priority: High  . Aortic root dilatation ([place]) 10/06/2016    Priority: High  . Osteoporosis 01/07/2015    Priority: High  . CAD, ARTERY BYPASS GRAFT 05/21/2009    Priority: High  . Carotid artery stenosis 04/20/2008    Priority: High  . Diabetes mellitus type II, controlled ([place]) 04/19/2007    Priority: High  . Anemia 09/03/2013    Priority: Medium  . Essential hypertension, benign 10/11/2012    Priority: Medium  . Hyperlipidemia 04/19/2007    Priority: Medium  . Seborrheic dermatitis 09/13/2017    Priority: Low  . Senile purpura ([place]) 08/21/2017    Priority: Low  . History of acute inferior wall MI 09/03/2015    Priority: Low  . Foot drop, left 08/23/2012    Priority: Low  . History of supraventricular tachycardia 01/25/2011    Priority: Low  . Hypogonadism in male 02/04/2010    Priority: Low  . History of colonic polyps 09/07/2009    Priority: Low  . GERD 04/19/2007    Priority: Low  . Hx of CABG 12/13/2017  . PVC's (premature ventricular contractions) 12/13/2017    Medications- reviewed and updated Current Outpatient Medications  Medication Sig Dispense Refill  . alendronate (FOSAMAX) 70 MG tablet TAKE 1 TABLET BY MOUTH EVERY 7 DAYS WITH A FULL GLASS OF WATER ON AN EMPTY STOMACH 12 tablet 4  . aspirin 81 MG tablet Take 81 mg by mouth daily.    Marland Kitchen atorvastatin (LIPITOR) 80 MG tablet TAKE 1 TABLET DAILY. 90 tablet 3  . cycloSPORINE  (RESTASIS) 0.05 % ophthalmic emulsion 1 drop 2 (two) times daily.    . diclofenac sodium (VOLTAREN) 1 % GEL Apply 4 g topically 4 (four) times daily. 100 g 1  . diltiazem (MATZIM LA) 180 MG 24 hr tablet Take 1 tablet (180 mg total) by mouth daily. 90 tablet 1  . glimepiride (AMARYL) 1 MG tablet TAKE 1 TABLET (1 MG TOTAL) BY MOUTH DAILY WITH BREAKFAST. 90 tablet 3  . glucose blood test strip Use to test your blood sugar daily. E11.9 One touch Verio test strips 100 each 12  . ketoconazole (NIZORAL) 2 % shampoo APPLY 2 TIMES A WEEK 120 mL 3  . Lancets (ONETOUCH ULTRASOFT) lancets USE TO TEST BLOOD SUGARS DAILY. 100 each 3  . metFORMIN (GLUCOPHAGE) 1000 MG tablet Take 1 tablet (1,000 mg total) by mouth 2 (two) times daily with a meal. 180 tablet 3  . metoprolol succinate (TOPROL-XL) 50 MG 24 hr tablet TAKE 1 TABLET BY MOUTH EVERY DAY 90 tablet 1  . omeprazole (PRILOSEC) 20 MG capsule Take 1 capsule (20 mg total) by mouth daily. 90 capsule 3  . ONETOUCH VERIO test strip USE TO TEST BLOOD SUGARS DAILY. 100 each 3   No current facility-administered medications for this visit.      Objective:  BP (!) 150/80   Pulse 79   Temp 98.5 F (  36.9 C)   Resp (!) 98   Ht 5\' 8"  (1.727 m)   Wt 163 lb 9.6 oz (74.2 kg)   BMI 24.88 kg/m  Gen: NAD, resting comfortably CV: RRR no murmurs rubs or gallops Lungs: CTAB no crackles, wheeze, rhonchi Ext: 1+ edema Skin: warm, dry Neuro: grossly normal, moves all extremities  Preferred not to take his leg braces off for diabetic foot exam today    Assessment and Plan   # Diabetes S: hopefully controlled on Amaryl 1mg , metformin 1000 mg twice a day CBGs- not checking blood sugar lately. Denies low blood sugars.  Exercise and diet- pt states his diet is going well and he doesn't exercise because he cant walk - walks with walker. Feels like he is slowing down overall.  A/P: hopefully controlled- will get a1c with labs  #Stenosis of right carotid artery S:  October 10, 2018 moderate right and mild left carotid artery stenosis with plan to repeat in 1 year per Dr. Roanna Epley A/P: stable on last imaging- continue cardiology follow up and meds for risk factor modification as well as aspirin   #Atherosclerosis of coronary artery bypass graft of native heart without angina pectoris S: Patient follows regularly with Dr. Debara Pickett.  He is compliant with aspirin, statin, metoprolol.  CABG in 2004.  Today reports no chest pain or shortness of breath  A/P: Stable. Continue current medications.    Aortic root dilatation (HCC) S: Last imaged October 19, 2016 through Dr. Maudie Flakes aortic root size at that time. A/P:  From avs "Talk to Dr. Debara Pickett at follow up if you need another echocardiogram- if he does not order one at follow up we may consider this next visit"   Abdominal aortic aneurysm (AAA) without rupture [facility]) S: August 2020 showed distal aortic aneurysm 4.4 cm.  Yearly follow-up needed.  Found incidentally during other work-up A/P: we discussed repeat imaging next year. Encouraged go to hospital if had worsening abdominal pain.    Mixed hyperlipidemia S: Patient is compliant with atorvastatin 80 mg.  LDL goal under 70 A/P: Stable. Continue current medications.    Essential hypertension, benign S: Compliant with metoprolol 50 mg extended release, diltiazem 180 mg extended release A/P: from avs "Blood pressure slightly high today- lets have you check at home once a day for next two weeks and then drop off those #s by our office - with your name and date of birth listed. Consider omron series 3 cuff on [employer] or [employer]- should be around $33"   #Osteoporosis S: Patient is compliant with Fosamax. He is not sure if hes taking calcium, vitamin D - tries to take omeprazole very sparingly A/P: doing well on fosamax - likely repeat next year. reemphasized calcium/vitamin D needs and how calcium through diet is more ideal.   #MSK - fist thing each AM gets some right  buttocks pain going down into his right upper leg- voltaren resolves issue. Not worsening over time. -if worsens could consider prednisone- but only 15 mins and getting better.  - walking with walker  - doing therapy 2 days a week  -feels like not moving as well as he once did.   Recommended follow up:  6 months as long as home blood pressures are <140/90 on average Future Appointments  Date Time Provider [place]  01/28/2019  2:30 PM Lyndee Hensen, PT [facility]  02/04/2019  2:30 PM Lyndee Hensen, PT [facility]   Lab/Order associations:   ICD-10-CM   1. Controlled  type 2 diabetes mellitus without complication, without long-term current use of insulin (HCC)  E11.9   2. Stenosis of right carotid artery  I65.21   3. Atherosclerosis of coronary artery bypass graft of native heart without angina pectoris  I25.810   4. Aortic root dilatation (HCC)  I77.810   5. Abdominal aortic aneurysm (AAA) without rupture (HCC)  I71.4   6. Mixed hyperlipidemia  E78.2   7. Essential hypertension, benign  I10    Meds ordered this encounter  Medications  . atorvastatin (LIPITOR) 80 MG tablet    Sig: TAKE 1 TABLET DAILY.    Dispense:  90 tablet    Refill:  3    Return precautions advised.  [name], MD

## 2019-01-21 ENCOUNTER — Encounter: Payer: Self-pay | Admitting: Family Medicine

## 2019-01-21 ENCOUNTER — Other Ambulatory Visit: Payer: Self-pay

## 2019-01-21 ENCOUNTER — Ambulatory Visit (INDEPENDENT_AMBULATORY_CARE_PROVIDER_SITE_OTHER): Payer: Medicare Other | Admitting: Family Medicine

## 2019-01-21 VITALS — BP 150/80 | HR 79 | Temp 98.5°F | Resp 98 | Ht 68.0 in | Wt 163.6 lb

## 2019-01-21 DIAGNOSIS — I6521 Occlusion and stenosis of right carotid artery: Secondary | ICD-10-CM

## 2019-01-21 DIAGNOSIS — I714 Abdominal aortic aneurysm, without rupture, unspecified: Secondary | ICD-10-CM

## 2019-01-21 DIAGNOSIS — I10 Essential (primary) hypertension: Secondary | ICD-10-CM

## 2019-01-21 DIAGNOSIS — I77810 Thoracic aortic ectasia: Secondary | ICD-10-CM

## 2019-01-21 DIAGNOSIS — E119 Type 2 diabetes mellitus without complications: Secondary | ICD-10-CM | POA: Diagnosis not present

## 2019-01-21 DIAGNOSIS — E782 Mixed hyperlipidemia: Secondary | ICD-10-CM

## 2019-01-21 DIAGNOSIS — I25810 Atherosclerosis of coronary artery bypass graft(s) without angina pectoris: Secondary | ICD-10-CM | POA: Diagnosis not present

## 2019-01-21 MED ORDER — ATORVASTATIN CALCIUM 80 MG PO TABS: ORAL_TABLET | ORAL | 3 refills | Status: DC

## 2019-01-28 ENCOUNTER — Encounter: Payer: Medicare Other | Admitting: Physical Therapy

## 2019-01-30 ENCOUNTER — Other Ambulatory Visit: Payer: Self-pay | Admitting: Family Medicine

## 2019-01-31 ENCOUNTER — Other Ambulatory Visit: Payer: Self-pay | Admitting: Family Medicine

## 2019-02-04 ENCOUNTER — Ambulatory Visit (INDEPENDENT_AMBULATORY_CARE_PROVIDER_SITE_OTHER): Payer: Medicare Other | Admitting: Physical Therapy

## 2019-02-04 ENCOUNTER — Other Ambulatory Visit: Payer: Self-pay

## 2019-02-04 DIAGNOSIS — M5441 Lumbago with sciatica, right side: Secondary | ICD-10-CM

## 2019-02-04 DIAGNOSIS — G8929 Other chronic pain: Secondary | ICD-10-CM

## 2019-02-04 DIAGNOSIS — M25551 Pain in right hip: Secondary | ICD-10-CM | POA: Diagnosis not present

## 2019-02-04 DIAGNOSIS — R293 Abnormal posture: Secondary | ICD-10-CM | POA: Diagnosis not present

## 2019-02-05 ENCOUNTER — Encounter: Payer: Self-pay | Admitting: Physical Therapy

## 2019-02-06 ENCOUNTER — Encounter: Payer: Self-pay | Admitting: Physical Therapy

## 2019-02-06 NOTE — Therapy (Addendum)
[facility] [address] [place], [place], [address] Phone: [phone]   Fax:  [phone]  Physical Therapy Treatment  Patient Details  Name: Steven [name] MRN: [id number] Date of Birth: 1930-07-06 Referring Provider (PT): Orma Flaming   Encounter Date: 02/04/2019  PT End of Session - 02/05/19 1008     Visit Number  6    Number of Visits  12    Date for PT Re-Evaluation  03/18/19    Authorization Type  [employer] Medicare    PT Start Time  4270    PT Stop Time  1515    PT Time Calculation (min)  39 min    Activity Tolerance  Patient tolerated treatment well    Behavior During Therapy  [facility] for tasks assessed/performed;Impulsive        Past Medical History:  Diagnosis Date   Abnormality of gait 08/23/2012   Anemia    hx of   Arthritis 01-03-12   osteoarthritis-knees   CAD (coronary artery disease) 2004   s/p inferior wall Mi 2004 with PTCA/Stent; s/p CABG 2004   Degenerative arthritis    Diabetes mellitus    type II   Foot drop, bilateral 08/23/2012   GERD (gastroesophageal reflux disease)    Heart disease    History of kidney stones    History of myocardial infarction    Hyperlipidemia    Myocardial infarction [facility]) 01-03-12   '04   MYOCARDIAL INFARCTION, HX OF 04/19/2007   Qualifier: Diagnosis of  By: Scherrie Gerlach     Neuropathy 01-03-12   bil. feet   Pneumonia    hx of   Polyneuropathy in diabetes(357.2) 08/23/2012   Radiculopathy of lumbar region 08/23/2011   Followed by Dr. Vertell Limber. S/p surgery. Resolved after surgery.      Past Surgical History:  Procedure Laterality Date   BACK SURGERY  2013   CARDIAC CATHETERIZATION  01-03-12   '04   CATARACT EXTRACTION Bilateral 2015   COLONOSCOPY     CORONARY ARTERY BYPASS GRAFT  2004   LIMA to LAD; SVG to ramus intermedius; SVG to PDA/PLSA   LAMINECTOMY  09/08/2011   Procedure: LUMBAR LAMINECTOMY FOR TUMOR;  Surgeon: Erline Levine, MD;  Location: [facility];  Service:  Neurosurgery;  Laterality: Left;  Left Thoracic twelve-Lumbar one transpedicular resection of epidural mass   TONSILLECTOMY  as child   TOTAL KNEE ARTHROPLASTY  01/15/2012   Procedure: TOTAL KNEE ARTHROPLASTY;  Surgeon: Gearlean Alf, MD;  Location: [facility];  Service: Orthopedics;  Laterality: Right;   TOTAL KNEE ARTHROPLASTY Left 09/16/2012   Procedure: LEFT TOTAL KNEE ARTHROPLASTY;  Surgeon: Gearlean Alf, MD;  Location: [facility];  Service: Orthopedics;  Laterality: Left;    There were no vitals filed for this visit.  Subjective Assessment - 02/05/19 1004     Subjective  Pt states several times that he has pain in his buttock region bilaterally, and into R LE. He is unable to say if he has done exercises at home or not, due to memory.    Patient Stated Goals  Decreased pain in back.    Currently in Pain?  Yes    Pain Score  7     Pain Location  Back    Pain Orientation  Right;Left    Pain Descriptors / Indicators  Aching    Pain Type  Acute pain    Pain Radiating Towards  Radiates into Bil buttock and R LE  Pain Onset  More than a month ago    Pain Frequency  Intermittent                        [facility] Adult PT Treatment/Exercise - 02/06/19 0001       Ambulation/Gait   Gait Comments  35 ft x 8, with verbal cueing for upright posture and safe distance from RW.      Lumbar Exercises: Stretches   Active Hamstring Stretch  2 reps;30 seconds    Active Hamstring Stretch Limitations  seated    Single Knee to Chest Stretch  30 seconds;Right;2 reps;Left    Pelvic Tilt  20 reps    Pelvic Tilt Limitations  supine    Piriformis Stretch  2 reps;20 seconds    Piriformis Stretch Limitations  bil      Lumbar Exercises: Seated   Other Seated Lumbar Exercises  Rown GTB x20;       Lumbar Exercises: Supine   Clam  20 reps    Clam Limitations  GTB    Bent Knee Raise  20 reps      Manual Therapy   Manual therapy comments  Manual stretching for HS and R hip;     Manual  Traction  Long leg distraction on R x3 min for lumbar               PT Education - 02/05/19 1007     Education Details  HEP reviewed with max detail today, given new handouts.    Person(s) Educated  Patient    Methods  Explanation;Demonstration;Handout    Comprehension  Verbalized understanding;Returned demonstration;Verbal cues required;Tactile cues required;Need further instruction        PT Short Term Goals - 02/05/19 1010       PT SHORT TERM GOAL #1   Title  Pt to be independent with initial HEP    Time  2    Period  Weeks    Status  Unable to assess    Target Date  01/02/19         PT Long Term Goals - 02/05/19 1010       PT LONG TERM GOAL #1   Title  Pt to be independent with final HEP for back/hip pain .    Time  6    Period  Weeks    Status  On-going      PT LONG TERM GOAL #2   Title  Pt to report decreased pain in R side of Back/Hip to 0-3/10 with seated and standing activity.    Time  6    Period  Weeks    Status  On-going      PT LONG TERM GOAL #3   Title  Pt to demo ability to self correct posture, to his optimal available position, to improve LBP.    Time  6    Period  Weeks    Status  On-going      PT LONG TERM GOAL #4   Title  Pt to demo increased strength of core and LEs to at least 4+/5 to improve stability and pain.    Time  6    Period  Weeks    Status  On-going             Plan - 02/05/19 1012     Clinical Impression Statement  Pt continues to complaint of pain in Buttock region and R LE. Very difficult to assess  if pt is doing HEP, due to poor memory. Pt asks same questions throughout session, and same questions from session to session. Pt with much difficulty sustaining optimal seated posture. Requires max verbal and tactile assist for performing ther ex correctly. Discussed options for ortho MD for pt to see in future, due to continued pain. Will call pts daughter to discuss referral to ortho with her. Pt may benefit from  referral to assess other pain relief options. Carry over for PT has been difficult.    Personal Factors and Comorbidities  Age;Past/Current Experience;Comorbidity 1    Comorbidities  Prev Back surgery, posture, DM, MI, Neruopathy, Memory    Examination-Activity Limitations  Bathing;Locomotion Level;Transfers;Bed Mobility;Bend;Sit;Carry;Squat;Stairs;Stand;Lift    Examination-Participation Restrictions  Meal Prep;Cleaning;Community Activity;Driving;Shop;Yard Work;Laundry;Medication Management    Stability/Clinical Decision Making  Evolving/Moderate complexity    Rehab Potential  Fair    PT Frequency  2x / week    PT Duration  6 weeks    PT Treatment/Interventions  ADLs/Self Care Home Management;Cryotherapy;Electrical Stimulation;DME Instruction;Ultrasound;Moist Heat;Iontophoresis '4mg'$ /ml Dexamethasone;Gait training;Stair training;Functional mobility training;Therapeutic activities;Therapeutic exercise;Balance training;Neuromuscular re-education;Manual techniques;Patient/family education;Passive range of motion;Dry needling;Energy conservation;Joint Manipulations;Spinal Manipulations;Taping    Consulted and Agree with Plan of Care  Patient        Patient will benefit from skilled therapeutic intervention in order to improve the following deficits and impairments:  Abnormal gait, Decreased coordination, Decreased range of motion, Difficulty walking, Increased muscle spasms, Decreased safety awareness, Decreased endurance, Decreased activity tolerance, Decreased knowledge of precautions, Decreased skin integrity, Impaired perceived functional ability, Pain, Improper body mechanics, Impaired flexibility, Hypomobility, Decreased knowledge of use of DME, Decreased balance, Decreased cognition, Decreased mobility, Decreased strength, Increased edema, Postural dysfunction  Visit Diagnosis: Chronic right-sided low back pain with right-sided sciatica  Pain in right hip  Abnormal posture     Problem  List Patient Active Problem List   Diagnosis Date Noted   AAA (abdominal aortic aneurysm) ([place]) 12/13/2018   Hx of CABG 12/13/2017   PVC's (premature ventricular contractions) 12/13/2017   Seborrheic dermatitis 09/13/2017   Falls frequently 08/21/2017   Senile purpura ([place]) 08/21/2017   Aortic root dilatation (HCC) 10/06/2016   History of acute inferior wall MI 09/03/2015   Osteoporosis 01/07/2015   Anemia 09/03/2013   Essential hypertension, benign 10/11/2012   Foot drop, left 08/23/2012   History of supraventricular tachycardia 01/25/2011   Hypogonadism in male 02/04/2010   History of colonic polyps 09/07/2009   CAD, ARTERY BYPASS GRAFT 05/21/2009   Carotid artery stenosis 04/20/2008   Diabetes mellitus type II, controlled ([place]) 04/19/2007   Hyperlipidemia 04/19/2007   GERD 04/19/2007    Lyndee Hensen, PT, DPT 4:39 PM  02/06/19    [facility] [address] [place], [place], [address] Phone: [phone]   Fax:  [phone]  Name: TULLIO CHAUSSE MRN: [id number] Date of Birth: 11-23-1930  PHYSICAL THERAPY DISCHARGE SUMMARY  Visits from Start of Care:  6 Plan: Patient agrees to discharge.  Patient goals were partially met. Patient is being discharged due to - not returning since last visit, referred back to MD.     Lyndee Hensen, PT, DPT 8:40 AM  06/22/21

## 2019-02-25 ENCOUNTER — Other Ambulatory Visit: Payer: Self-pay | Admitting: Family Medicine

## 2019-03-17 ENCOUNTER — Other Ambulatory Visit: Payer: Self-pay

## 2019-03-17 ENCOUNTER — Encounter: Payer: Self-pay | Admitting: Family Medicine

## 2019-03-17 ENCOUNTER — Ambulatory Visit: Payer: Medicare Other | Admitting: Family Medicine

## 2019-03-17 ENCOUNTER — Ambulatory Visit (INDEPENDENT_AMBULATORY_CARE_PROVIDER_SITE_OTHER): Payer: Medicare Other | Admitting: Family Medicine

## 2019-03-17 VITALS — BP 152/78 | HR 88 | Temp 98.1°F | Ht 68.0 in | Wt 161.8 lb

## 2019-03-17 DIAGNOSIS — L89302 Pressure ulcer of unspecified buttock, stage 2: Secondary | ICD-10-CM

## 2019-03-17 DIAGNOSIS — I10 Essential (primary) hypertension: Secondary | ICD-10-CM

## 2019-03-17 DIAGNOSIS — E119 Type 2 diabetes mellitus without complications: Secondary | ICD-10-CM | POA: Diagnosis not present

## 2019-03-17 DIAGNOSIS — E782 Mixed hyperlipidemia: Secondary | ICD-10-CM | POA: Diagnosis not present

## 2019-03-17 MED ORDER — ATORVASTATIN CALCIUM 80 MG PO TABS: ORAL_TABLET | ORAL | 3 refills | Status: DC

## 2019-03-17 NOTE — Patient Instructions (Addendum)
Health Maintenance Due  Topic Date Due  . FOOT EXAM has braces on both feet-declines today 03/29/2018  . TETANUS/TDAP will get from pharmacy 12/23/2018   Team- lets apply a duoderm if we have it today with plan to remove in 2-3 days from both buttocks cheek- can mark with date applied.   Advise frequent position changes so you are not sitting on the buttocks in same way regularly  I am sending home health nurse since he is not confident in his ability or family ability to work in this area for him- ordered as urgent.   Please do some home monitoring on blood pressure and schedule  follow-up in a few weeks -bring your blood pressures with you to the visit.  If your blood pressures simply look excellent such as 138/88 or less then it is okay to just call and update [place] with these numbers.

## 2019-03-17 NOTE — Progress Notes (Signed)
Phone [phone] In person visit   Subjective:   Steven [name] is a 85 y.o. year old very pleasant male patient who presents for/with See problem oriented charting Chief Complaint  Patient presents with   growth on buttocks    2-4 weeks    ROS- Review of Systems  Constitutional: Negative.   HENT: Negative.   Eyes: Negative.   Respiratory: Negative.   Cardiovascular: Positive for leg swelling (bilateral leg swelling).  Gastrointestinal: Negative.   Genitourinary: Negative.   Musculoskeletal: Negative.   Skin:       Skin is flaky on buttocks  Neurological: Negative.   Endo/Heme/Allergies: Negative.   Psychiatric/Behavioral: Negative.      This visit occurred during the SARS-CoV-2 public health emergency.  Safety protocols were in place, including screening questions prior to the visit, additional usage of staff PPE, and extensive cleaning of exam room while observing appropriate contact time as indicated for disinfecting solutions.   Past Medical History-  Patient Active Problem List   Diagnosis Date Noted   AAA (abdominal aortic aneurysm) ([place]) 12/13/2018    Priority: High   Falls frequently 08/21/2017    Priority: High   Aortic root dilatation ([place]) 10/06/2016    Priority: High   Osteoporosis 01/07/2015    Priority: High   CAD, ARTERY BYPASS GRAFT 05/21/2009    Priority: High   Carotid artery stenosis 04/20/2008    Priority: High   Diabetes mellitus type II, controlled ([place]) 04/19/2007    Priority: High   Anemia 09/03/2013    Priority: Medium   Essential hypertension, benign 10/11/2012    Priority: Medium   Hyperlipidemia 04/19/2007    Priority: Medium   Seborrheic dermatitis 09/13/2017    Priority: Low   Senile purpura ([place]) 08/21/2017    Priority: Low   History of acute inferior wall MI 09/03/2015    Priority: Low   Foot drop, left 08/23/2012    Priority: Low   History of supraventricular tachycardia 01/25/2011    Priority: Low    Hypogonadism in male 02/04/2010    Priority: Low   History of colonic polyps 09/07/2009    Priority: Low   GERD 04/19/2007    Priority: Low   Hx of CABG 12/13/2017   PVC's (premature ventricular contractions) 12/13/2017    Medications- reviewed and updated Current Outpatient Medications  Medication Sig Dispense Refill   alendronate (FOSAMAX) 70 MG tablet TAKE 1 TABLET BY MOUTH EVERY 7 DAYS WITH A FULL GLASS OF WATER ON AN EMPTY STOMACH 12 tablet 4   aspirin 81 MG tablet Take 81 mg by mouth daily.     atorvastatin (LIPITOR) 80 MG tablet TAKE 1 TABLET DAILY. 90 tablet 3   cycloSPORINE (RESTASIS) 0.05 % ophthalmic emulsion 1 drop 2 (two) times daily.     diclofenac sodium (VOLTAREN) 1 % GEL Apply 4 g topically 4 (four) times daily. 100 g 1   diltiazem (MATZIM LA) 180 MG 24 hr tablet Take 1 tablet (180 mg total) by mouth daily. 90 tablet 1   glimepiride (AMARYL) 1 MG tablet TAKE 1 TABLET (1 MG TOTAL) BY MOUTH DAILY WITH BREAKFAST. 90 tablet 3   glucose blood test strip Use to test your blood sugar daily. E11.9 One touch Verio test strips 100 each 12   ketoconazole (NIZORAL) 2 % shampoo APPLY 2 TIMES A WEEK 120 mL 3   Lancets (ONETOUCH ULTRASOFT) lancets USE TO TEST BLOOD SUGARS DAILY. 100 each 3   metFORMIN (GLUCOPHAGE) 1000 MG tablet Take  1 tablet (1,000 mg total) by mouth 2 (two) times daily with a meal. 60 tablet 2   metoprolol succinate (TOPROL-XL) 50 MG 24 hr tablet TAKE 1 TABLET BY MOUTH EVERY DAY 90 tablet 1   omeprazole (PRILOSEC) 20 MG capsule Take 1 capsule (20 mg total) by mouth daily. 90 capsule 3   ONETOUCH VERIO test strip USE TO TEST BLOOD SUGARS DAILY. 100 each 3   No current facility-administered medications for this visit.      Objective:  BP (!) 152/78 (BP Location: Left Arm, Patient Position: Sitting, Cuff Size: Normal)    Pulse 88    Temp 98.1 F (36.7 C) (Temporal)    Ht 5\' 8"  (1.727 m)    Wt 161 lb 12.8 oz (73.4 kg)    SpO2 98%    BMI 24.60 kg/m    Gen: NAD, resting comfortably CV: RRR no murmurs rubs or gallops Lungs: CTAB no crackles, wheeze, rhonchi Abdomen: soft/nontender/nondistended/normal bowel sounds. No rebound or guarding.  Ext: 1+ edema Skin: warm, dry Buttocks: Half-dollar sized area with no epidermis present on left buttocks-surrounding this is mild blanching erythema, on right buttocks there is a dime sized area similar to the left buttocks.    Assessment and Plan    #  Pressure injury of buttock, stage 2, unspecified laterality ([place]) - Plan: Ambulatory referral to Home Health  Controlled type 2 diabetes mellitus without complication, without long-term current use of insulin (HCC)  Mixed hyperlipidemia  Essential hypertension, benign  S: Patient notes he has been more sedentary lately and is doing a lot of sitting in the same position.  Over the last month he has noted the development of an opening on bilateral buttocks in an area that seems to touch.  No expanding warmth noted in the area-he is not sure about redness surrounding the lesions but they do seem to be growing somewhat.  He thinks they have been present for at least a month.  Has not tried any treatments yet A/P: Stage II pressure ulcer-1 on left buttocks about a half dollar size and one on right buttocks about a dime size.  Patient is not confident in his ability to change dressings or in the ability of a family member to help him-we will send out home health under urgent referral for dressing changes.  I plan to start with a DuoDERM today as prefer hydrocolloid dressing but we did not have one available in clinic.  We placed a transparent dressing and encouraged him to take this off at least in 2 days. -Encouraged frequent position changes -Patient also with some edema from sedentary activity which may be helped with activity as well  # Diabetes S: Compliant with Metformin 1000 mg twice a day and Amaryl 1 mg Lab Results  Component Value Date   HGBA1C  6.5 11/28/2018   HGBA1C 5.9 02/26/2018   HGBA1C 5.5 08/21/2017   A/P: In light of decubitus ulcer-thankful patient has well-controlled diabetes.  Stable-continue without medication-did not feel strongly about checking A1c at this time   #hypertension S: compliant with diltiazem 180 mg extended release, metoprolol 50 mg 24-hour BP Readings from Last 3 Encounters:  03/17/19 (!) 152/78  01/21/19 (!) 150/80  01/09/19 116/62  A/P: Poor control for second visit in a row-patient is concerned pressure may be high because he was late today by 20 minutes and this significantly increased anxiety-he would like to do some home monitoring and encouraged 2 to 3-week follow-up to check on wound  and blood pressure  Mixed hyperlipidemia S: Patient is compliant with atorvastatin 80 mg.  LDL goal under 70 Lab Results  Component Value Date   CHOL 134 11/28/2018   HDL 36.90 (L) 11/28/2018   LDLCALC 65 11/28/2018   LDLDIRECT 74.0 02/26/2018   TRIG 160.0 (H) 11/28/2018   CHOLHDL 4 11/28/2018   A/P: Reasonable control with last check showing LDL 65-stable/continue current medications.  Refilled Lipitor at his request   Recommended follow up: 2 to 3-week follow-up verbally recommended Future Appointments  Date Time Provider [place]  07/22/2019  4:00 PM Marin Olp, MD [facility]    Lab/Order associations:   ICD-10-CM   1. Pressure injury of buttock, stage 2, unspecified laterality ([place])  L89.302 Ambulatory referral to Home Health  2. Controlled type 2 diabetes mellitus without complication, without long-term current use of insulin (HCC)  E11.9   3. Mixed hyperlipidemia  E78.2   4. Essential hypertension, benign  I10     Meds ordered this encounter     atorvastatin (LIPITOR) 80 MG tablet    Sig: TAKE 1 TABLET DAILY.    Dispense:  90 tablet    Refill:  3   Return precautions advised.  [name], MD

## 2019-03-18 NOTE — Patient Instructions (Signed)
Health Maintenance Due  Topic Date Due  . FOOT EXAM  03/29/2018  . TETANUS/TDAP  12/23/2018    Depression screen PHQ 2/9 01/21/2019 01/09/2019 11/28/2018  Decreased Interest 0 0 0  Down, Depressed, Hopeless 0 0 0  PHQ - 2 Score 0 0 0  Some recent data might be hidden    Recommended follow up: No follow-ups on file.

## 2019-03-18 NOTE — Progress Notes (Signed)
Appointment was moved to [id number] duplicate encounter unfortunately

## 2019-03-21 ENCOUNTER — Telehealth: Payer: Self-pay | Admitting: Family Medicine

## 2019-03-21 NOTE — Telephone Encounter (Signed)
See note

## 2019-03-21 NOTE — Telephone Encounter (Signed)
Home Health Verbal Orders - Caller/Agency: Laurey Arrow ([facility]) Callback Number: [phone] Requesting OT/PT/Skilled Nursing/Social Work/Speech Therapy: PT/OT Frequency: OT Eval + PT 2x3wk, 1x2wk

## 2019-03-21 NOTE — Telephone Encounter (Signed)
Called [facility] and provided verbal orders as requested.

## 2019-03-21 NOTE — Telephone Encounter (Signed)
Patient seen 03-17-2019 for this reason ok to call and give verbal

## 2019-03-28 ENCOUNTER — Telehealth: Payer: Self-pay

## 2019-03-28 NOTE — Telephone Encounter (Signed)
Erica for [facility] called to get verbal orders for patient's wound care. The hydrochlorode is not working and Randall Hiss wants to try Hydrofiber instead. Ok per Dr. Yong Channel.

## 2019-04-03 DIAGNOSIS — M810 Age-related osteoporosis without current pathological fracture: Secondary | ICD-10-CM

## 2019-04-03 DIAGNOSIS — L89312 Pressure ulcer of right buttock, stage 2: Secondary | ICD-10-CM

## 2019-04-22 DIAGNOSIS — M810 Age-related osteoporosis without current pathological fracture: Secondary | ICD-10-CM | POA: Diagnosis not present

## 2019-04-22 DIAGNOSIS — L89312 Pressure ulcer of right buttock, stage 2: Secondary | ICD-10-CM | POA: Diagnosis not present

## 2019-04-26 DIAGNOSIS — L89312 Pressure ulcer of right buttock, stage 2: Secondary | ICD-10-CM | POA: Diagnosis not present

## 2019-04-26 DIAGNOSIS — M810 Age-related osteoporosis without current pathological fracture: Secondary | ICD-10-CM | POA: Diagnosis not present

## 2019-04-28 DIAGNOSIS — L89312 Pressure ulcer of right buttock, stage 2: Secondary | ICD-10-CM | POA: Diagnosis not present

## 2019-04-28 DIAGNOSIS — M810 Age-related osteoporosis without current pathological fracture: Secondary | ICD-10-CM | POA: Diagnosis not present

## 2019-04-30 DIAGNOSIS — M810 Age-related osteoporosis without current pathological fracture: Secondary | ICD-10-CM | POA: Diagnosis not present

## 2019-04-30 DIAGNOSIS — L89312 Pressure ulcer of right buttock, stage 2: Secondary | ICD-10-CM | POA: Diagnosis not present

## 2019-05-01 DIAGNOSIS — M810 Age-related osteoporosis without current pathological fracture: Secondary | ICD-10-CM | POA: Diagnosis not present

## 2019-05-01 DIAGNOSIS — L89312 Pressure ulcer of right buttock, stage 2: Secondary | ICD-10-CM | POA: Diagnosis not present

## 2019-05-06 DIAGNOSIS — L89312 Pressure ulcer of right buttock, stage 2: Secondary | ICD-10-CM | POA: Diagnosis not present

## 2019-05-06 DIAGNOSIS — M810 Age-related osteoporosis without current pathological fracture: Secondary | ICD-10-CM | POA: Diagnosis not present

## 2019-05-07 DIAGNOSIS — L89312 Pressure ulcer of right buttock, stage 2: Secondary | ICD-10-CM | POA: Diagnosis not present

## 2019-05-07 DIAGNOSIS — M810 Age-related osteoporosis without current pathological fracture: Secondary | ICD-10-CM | POA: Diagnosis not present

## 2019-05-08 DIAGNOSIS — M810 Age-related osteoporosis without current pathological fracture: Secondary | ICD-10-CM | POA: Diagnosis not present

## 2019-05-08 DIAGNOSIS — L89312 Pressure ulcer of right buttock, stage 2: Secondary | ICD-10-CM | POA: Diagnosis not present

## 2019-05-13 DIAGNOSIS — M810 Age-related osteoporosis without current pathological fracture: Secondary | ICD-10-CM | POA: Diagnosis not present

## 2019-05-13 DIAGNOSIS — L89312 Pressure ulcer of right buttock, stage 2: Secondary | ICD-10-CM | POA: Diagnosis not present

## 2019-05-16 DIAGNOSIS — M810 Age-related osteoporosis without current pathological fracture: Secondary | ICD-10-CM | POA: Diagnosis not present

## 2019-05-16 DIAGNOSIS — L89312 Pressure ulcer of right buttock, stage 2: Secondary | ICD-10-CM | POA: Diagnosis not present

## 2019-05-19 ENCOUNTER — Other Ambulatory Visit: Payer: Self-pay | Admitting: Family Medicine

## 2019-05-20 DIAGNOSIS — I2510 Atherosclerotic heart disease of native coronary artery without angina pectoris: Secondary | ICD-10-CM | POA: Diagnosis not present

## 2019-05-20 DIAGNOSIS — L89323 Pressure ulcer of left buttock, stage 3: Secondary | ICD-10-CM | POA: Diagnosis not present

## 2019-05-20 DIAGNOSIS — E119 Type 2 diabetes mellitus without complications: Secondary | ICD-10-CM | POA: Diagnosis not present

## 2019-05-20 DIAGNOSIS — R2681 Unsteadiness on feet: Secondary | ICD-10-CM | POA: Diagnosis not present

## 2019-05-20 DIAGNOSIS — Z7984 Long term (current) use of oral hypoglycemic drugs: Secondary | ICD-10-CM | POA: Diagnosis not present

## 2019-05-20 DIAGNOSIS — Z951 Presence of aortocoronary bypass graft: Secondary | ICD-10-CM | POA: Diagnosis not present

## 2019-05-20 DIAGNOSIS — M810 Age-related osteoporosis without current pathological fracture: Secondary | ICD-10-CM | POA: Diagnosis not present

## 2019-05-20 DIAGNOSIS — I10 Essential (primary) hypertension: Secondary | ICD-10-CM | POA: Diagnosis not present

## 2019-05-23 DIAGNOSIS — Z7984 Long term (current) use of oral hypoglycemic drugs: Secondary | ICD-10-CM | POA: Diagnosis not present

## 2019-05-23 DIAGNOSIS — I10 Essential (primary) hypertension: Secondary | ICD-10-CM | POA: Diagnosis not present

## 2019-05-23 DIAGNOSIS — R2681 Unsteadiness on feet: Secondary | ICD-10-CM | POA: Diagnosis not present

## 2019-05-23 DIAGNOSIS — Z951 Presence of aortocoronary bypass graft: Secondary | ICD-10-CM | POA: Diagnosis not present

## 2019-05-23 DIAGNOSIS — L89323 Pressure ulcer of left buttock, stage 3: Secondary | ICD-10-CM | POA: Diagnosis not present

## 2019-05-23 DIAGNOSIS — M810 Age-related osteoporosis without current pathological fracture: Secondary | ICD-10-CM | POA: Diagnosis not present

## 2019-05-23 DIAGNOSIS — E119 Type 2 diabetes mellitus without complications: Secondary | ICD-10-CM | POA: Diagnosis not present

## 2019-05-23 DIAGNOSIS — I2510 Atherosclerotic heart disease of native coronary artery without angina pectoris: Secondary | ICD-10-CM | POA: Diagnosis not present

## 2019-05-24 DIAGNOSIS — Z7984 Long term (current) use of oral hypoglycemic drugs: Secondary | ICD-10-CM | POA: Diagnosis not present

## 2019-05-24 DIAGNOSIS — R2681 Unsteadiness on feet: Secondary | ICD-10-CM | POA: Diagnosis not present

## 2019-05-24 DIAGNOSIS — I10 Essential (primary) hypertension: Secondary | ICD-10-CM | POA: Diagnosis not present

## 2019-05-24 DIAGNOSIS — E119 Type 2 diabetes mellitus without complications: Secondary | ICD-10-CM | POA: Diagnosis not present

## 2019-05-24 DIAGNOSIS — I2510 Atherosclerotic heart disease of native coronary artery without angina pectoris: Secondary | ICD-10-CM | POA: Diagnosis not present

## 2019-05-24 DIAGNOSIS — Z951 Presence of aortocoronary bypass graft: Secondary | ICD-10-CM | POA: Diagnosis not present

## 2019-05-24 DIAGNOSIS — M810 Age-related osteoporosis without current pathological fracture: Secondary | ICD-10-CM | POA: Diagnosis not present

## 2019-05-24 DIAGNOSIS — L89323 Pressure ulcer of left buttock, stage 3: Secondary | ICD-10-CM | POA: Diagnosis not present

## 2019-05-26 DIAGNOSIS — Z951 Presence of aortocoronary bypass graft: Secondary | ICD-10-CM | POA: Diagnosis not present

## 2019-05-26 DIAGNOSIS — L89323 Pressure ulcer of left buttock, stage 3: Secondary | ICD-10-CM | POA: Diagnosis not present

## 2019-05-26 DIAGNOSIS — Z7984 Long term (current) use of oral hypoglycemic drugs: Secondary | ICD-10-CM | POA: Diagnosis not present

## 2019-05-26 DIAGNOSIS — R2681 Unsteadiness on feet: Secondary | ICD-10-CM | POA: Diagnosis not present

## 2019-05-26 DIAGNOSIS — E119 Type 2 diabetes mellitus without complications: Secondary | ICD-10-CM | POA: Diagnosis not present

## 2019-05-26 DIAGNOSIS — I2510 Atherosclerotic heart disease of native coronary artery without angina pectoris: Secondary | ICD-10-CM | POA: Diagnosis not present

## 2019-05-26 DIAGNOSIS — I10 Essential (primary) hypertension: Secondary | ICD-10-CM | POA: Diagnosis not present

## 2019-05-26 DIAGNOSIS — M810 Age-related osteoporosis without current pathological fracture: Secondary | ICD-10-CM | POA: Diagnosis not present

## 2019-05-27 DIAGNOSIS — L89323 Pressure ulcer of left buttock, stage 3: Secondary | ICD-10-CM | POA: Diagnosis not present

## 2019-05-27 DIAGNOSIS — Z951 Presence of aortocoronary bypass graft: Secondary | ICD-10-CM | POA: Diagnosis not present

## 2019-05-27 DIAGNOSIS — I10 Essential (primary) hypertension: Secondary | ICD-10-CM | POA: Diagnosis not present

## 2019-05-27 DIAGNOSIS — Z7984 Long term (current) use of oral hypoglycemic drugs: Secondary | ICD-10-CM | POA: Diagnosis not present

## 2019-05-27 DIAGNOSIS — E119 Type 2 diabetes mellitus without complications: Secondary | ICD-10-CM | POA: Diagnosis not present

## 2019-05-27 DIAGNOSIS — I2510 Atherosclerotic heart disease of native coronary artery without angina pectoris: Secondary | ICD-10-CM | POA: Diagnosis not present

## 2019-05-27 DIAGNOSIS — R2681 Unsteadiness on feet: Secondary | ICD-10-CM | POA: Diagnosis not present

## 2019-05-27 DIAGNOSIS — M810 Age-related osteoporosis without current pathological fracture: Secondary | ICD-10-CM | POA: Diagnosis not present

## 2019-05-30 DIAGNOSIS — E119 Type 2 diabetes mellitus without complications: Secondary | ICD-10-CM | POA: Diagnosis not present

## 2019-05-30 DIAGNOSIS — I2510 Atherosclerotic heart disease of native coronary artery without angina pectoris: Secondary | ICD-10-CM | POA: Diagnosis not present

## 2019-05-30 DIAGNOSIS — I10 Essential (primary) hypertension: Secondary | ICD-10-CM | POA: Diagnosis not present

## 2019-05-30 DIAGNOSIS — Z951 Presence of aortocoronary bypass graft: Secondary | ICD-10-CM | POA: Diagnosis not present

## 2019-05-30 DIAGNOSIS — Z7984 Long term (current) use of oral hypoglycemic drugs: Secondary | ICD-10-CM | POA: Diagnosis not present

## 2019-05-30 DIAGNOSIS — L89323 Pressure ulcer of left buttock, stage 3: Secondary | ICD-10-CM | POA: Diagnosis not present

## 2019-05-30 DIAGNOSIS — R2681 Unsteadiness on feet: Secondary | ICD-10-CM | POA: Diagnosis not present

## 2019-05-30 DIAGNOSIS — M810 Age-related osteoporosis without current pathological fracture: Secondary | ICD-10-CM | POA: Diagnosis not present

## 2019-06-03 DIAGNOSIS — Z951 Presence of aortocoronary bypass graft: Secondary | ICD-10-CM | POA: Diagnosis not present

## 2019-06-03 DIAGNOSIS — M810 Age-related osteoporosis without current pathological fracture: Secondary | ICD-10-CM | POA: Diagnosis not present

## 2019-06-03 DIAGNOSIS — E119 Type 2 diabetes mellitus without complications: Secondary | ICD-10-CM | POA: Diagnosis not present

## 2019-06-03 DIAGNOSIS — Z7984 Long term (current) use of oral hypoglycemic drugs: Secondary | ICD-10-CM | POA: Diagnosis not present

## 2019-06-03 DIAGNOSIS — I2510 Atherosclerotic heart disease of native coronary artery without angina pectoris: Secondary | ICD-10-CM | POA: Diagnosis not present

## 2019-06-03 DIAGNOSIS — R2681 Unsteadiness on feet: Secondary | ICD-10-CM | POA: Diagnosis not present

## 2019-06-03 DIAGNOSIS — I10 Essential (primary) hypertension: Secondary | ICD-10-CM | POA: Diagnosis not present

## 2019-06-03 DIAGNOSIS — L89323 Pressure ulcer of left buttock, stage 3: Secondary | ICD-10-CM | POA: Diagnosis not present

## 2019-06-04 DIAGNOSIS — Z951 Presence of aortocoronary bypass graft: Secondary | ICD-10-CM | POA: Diagnosis not present

## 2019-06-04 DIAGNOSIS — Z7984 Long term (current) use of oral hypoglycemic drugs: Secondary | ICD-10-CM | POA: Diagnosis not present

## 2019-06-04 DIAGNOSIS — M810 Age-related osteoporosis without current pathological fracture: Secondary | ICD-10-CM | POA: Diagnosis not present

## 2019-06-04 DIAGNOSIS — I10 Essential (primary) hypertension: Secondary | ICD-10-CM | POA: Diagnosis not present

## 2019-06-04 DIAGNOSIS — R2681 Unsteadiness on feet: Secondary | ICD-10-CM | POA: Diagnosis not present

## 2019-06-04 DIAGNOSIS — E119 Type 2 diabetes mellitus without complications: Secondary | ICD-10-CM | POA: Diagnosis not present

## 2019-06-04 DIAGNOSIS — L89323 Pressure ulcer of left buttock, stage 3: Secondary | ICD-10-CM | POA: Diagnosis not present

## 2019-06-04 DIAGNOSIS — I2510 Atherosclerotic heart disease of native coronary artery without angina pectoris: Secondary | ICD-10-CM | POA: Diagnosis not present

## 2019-06-06 DIAGNOSIS — Z951 Presence of aortocoronary bypass graft: Secondary | ICD-10-CM | POA: Diagnosis not present

## 2019-06-06 DIAGNOSIS — I2510 Atherosclerotic heart disease of native coronary artery without angina pectoris: Secondary | ICD-10-CM | POA: Diagnosis not present

## 2019-06-06 DIAGNOSIS — R2681 Unsteadiness on feet: Secondary | ICD-10-CM | POA: Diagnosis not present

## 2019-06-06 DIAGNOSIS — I10 Essential (primary) hypertension: Secondary | ICD-10-CM | POA: Diagnosis not present

## 2019-06-06 DIAGNOSIS — M810 Age-related osteoporosis without current pathological fracture: Secondary | ICD-10-CM | POA: Diagnosis not present

## 2019-06-06 DIAGNOSIS — Z7984 Long term (current) use of oral hypoglycemic drugs: Secondary | ICD-10-CM | POA: Diagnosis not present

## 2019-06-06 DIAGNOSIS — L89323 Pressure ulcer of left buttock, stage 3: Secondary | ICD-10-CM | POA: Diagnosis not present

## 2019-06-06 DIAGNOSIS — E119 Type 2 diabetes mellitus without complications: Secondary | ICD-10-CM | POA: Diagnosis not present

## 2019-06-09 DIAGNOSIS — Z951 Presence of aortocoronary bypass graft: Secondary | ICD-10-CM | POA: Diagnosis not present

## 2019-06-09 DIAGNOSIS — E119 Type 2 diabetes mellitus without complications: Secondary | ICD-10-CM | POA: Diagnosis not present

## 2019-06-09 DIAGNOSIS — I2510 Atherosclerotic heart disease of native coronary artery without angina pectoris: Secondary | ICD-10-CM | POA: Diagnosis not present

## 2019-06-09 DIAGNOSIS — M810 Age-related osteoporosis without current pathological fracture: Secondary | ICD-10-CM | POA: Diagnosis not present

## 2019-06-09 DIAGNOSIS — L89323 Pressure ulcer of left buttock, stage 3: Secondary | ICD-10-CM | POA: Diagnosis not present

## 2019-06-09 DIAGNOSIS — I10 Essential (primary) hypertension: Secondary | ICD-10-CM | POA: Diagnosis not present

## 2019-06-09 DIAGNOSIS — Z7984 Long term (current) use of oral hypoglycemic drugs: Secondary | ICD-10-CM | POA: Diagnosis not present

## 2019-06-09 DIAGNOSIS — R2681 Unsteadiness on feet: Secondary | ICD-10-CM | POA: Diagnosis not present

## 2019-06-11 DIAGNOSIS — I2510 Atherosclerotic heart disease of native coronary artery without angina pectoris: Secondary | ICD-10-CM | POA: Diagnosis not present

## 2019-06-11 DIAGNOSIS — M810 Age-related osteoporosis without current pathological fracture: Secondary | ICD-10-CM | POA: Diagnosis not present

## 2019-06-11 DIAGNOSIS — L89323 Pressure ulcer of left buttock, stage 3: Secondary | ICD-10-CM | POA: Diagnosis not present

## 2019-06-11 DIAGNOSIS — I10 Essential (primary) hypertension: Secondary | ICD-10-CM | POA: Diagnosis not present

## 2019-06-11 DIAGNOSIS — Z7984 Long term (current) use of oral hypoglycemic drugs: Secondary | ICD-10-CM | POA: Diagnosis not present

## 2019-06-11 DIAGNOSIS — R2681 Unsteadiness on feet: Secondary | ICD-10-CM | POA: Diagnosis not present

## 2019-06-11 DIAGNOSIS — E119 Type 2 diabetes mellitus without complications: Secondary | ICD-10-CM | POA: Diagnosis not present

## 2019-06-11 DIAGNOSIS — Z951 Presence of aortocoronary bypass graft: Secondary | ICD-10-CM | POA: Diagnosis not present

## 2019-06-12 ENCOUNTER — Other Ambulatory Visit: Payer: Self-pay

## 2019-06-12 ENCOUNTER — Encounter (INDEPENDENT_AMBULATORY_CARE_PROVIDER_SITE_OTHER): Payer: Self-pay

## 2019-06-12 ENCOUNTER — Ambulatory Visit (INDEPENDENT_AMBULATORY_CARE_PROVIDER_SITE_OTHER): Payer: Medicare Other | Admitting: Physician Assistant

## 2019-06-12 ENCOUNTER — Encounter: Payer: Self-pay | Admitting: Physician Assistant

## 2019-06-12 VITALS — BP 120/80 | HR 65 | Temp 97.3°F | Ht 65.0 in | Wt 171.6 lb

## 2019-06-12 DIAGNOSIS — I493 Ventricular premature depolarization: Secondary | ICD-10-CM

## 2019-06-12 DIAGNOSIS — I25810 Atherosclerosis of coronary artery bypass graft(s) without angina pectoris: Secondary | ICD-10-CM | POA: Diagnosis not present

## 2019-06-12 DIAGNOSIS — E785 Hyperlipidemia, unspecified: Secondary | ICD-10-CM | POA: Diagnosis not present

## 2019-06-12 DIAGNOSIS — E119 Type 2 diabetes mellitus without complications: Secondary | ICD-10-CM

## 2019-06-12 NOTE — Progress Notes (Signed)
Cardiology Office Note:    Date:  06/14/2019   ID:  Steven [name], DOB [birth year], MRN [id number]  PCP:  Marin Olp, MD  Cardiologist:  Pixie Casino, MD  Electrophysiologist:  None   Referring MD: Marin Olp, MD   Chief Complaint  Patient presents with  . Follow-up    seen for Dr. Debara Pickett.    History of Present Illness:    Steven [name] is a 86 y.o. male with a hx of CAD s/p CABG 2004, HLD, DM II, GERD, vasovagal syncope, neuropathy and L sided foot drop.  Last echocardiogram obtained on 10/19/2016 showed EF 50 to 55%, grade 1 DD, mildly dilated ascending aorta at 39 mm.  Last Myoview obtained on 02/15/2018 showed EF 48%, fixed large inferior wall infarct from base to apex but no ischemia consistent with prior inferior MI.  Carotid artery ultrasound obtained on 10/10/2018 showed 40 to 59% right ICA stenosis, 1 to 39% left ICA stenosis.  A repeat study in 12 months was recommended.  AAA ultrasound obtained 01/12/2019 demonstrated 4.44 cm distal aortic aneurysm.  Dr. Yong Channel plans repeat study in 6 months.  Patient was last seen by Dr. Debara Pickett in July 2020 at which time it was noted patient has frequent PVCs, given prior low risk Myoview, no further work-up was planned.  Patient presents today accompanied by his daughter.  He denies any significant chest pain or shortness of breath.  He has no orthopnea or PND.  He is due for carotid ultrasound in June of this year.  Otherwise he has been doing well from cardiology perspective.  Past Medical History:  Diagnosis Date  . Abnormality of gait 08/23/2012  . Anemia    hx of  . Arthritis 01-03-12   osteoarthritis-knees  . CAD (coronary artery disease) 2004   s/p inferior wall Mi 2004 with PTCA/Stent; s/p CABG 2004  . Degenerative arthritis   . Diabetes mellitus    type II  . Foot drop, bilateral 08/23/2012  . GERD (gastroesophageal reflux disease)   . Heart disease   . History of kidney stones   . History of myocardial infarction     . Hyperlipidemia   . Myocardial infarction [facility]) 01-03-12   '04  . MYOCARDIAL INFARCTION, HX OF 04/19/2007   Qualifier: Diagnosis of  By: Scherrie Gerlach    . Neuropathy 01-03-12   bil. feet  . Pneumonia    hx of  . Polyneuropathy in diabetes(357.2) 08/23/2012  . Radiculopathy of lumbar region 08/23/2011   Followed by Dr. Vertell Limber. S/p surgery. Resolved after surgery.      Past Surgical History:  Procedure Laterality Date  . BACK SURGERY  2013  . CARDIAC CATHETERIZATION  01-03-12   '04  . CATARACT EXTRACTION Bilateral 2015  . COLONOSCOPY    . CORONARY ARTERY BYPASS GRAFT  2004   LIMA to LAD; SVG to ramus intermedius; SVG to PDA/PLSA  . LAMINECTOMY  09/08/2011   Procedure: LUMBAR LAMINECTOMY FOR TUMOR;  Surgeon: Erline Levine, MD;  Location: [facility];  Service: Neurosurgery;  Laterality: Left;  Left Thoracic twelve-Lumbar one transpedicular resection of epidural mass  . TONSILLECTOMY  as child  . TOTAL KNEE ARTHROPLASTY  01/15/2012   Procedure: TOTAL KNEE ARTHROPLASTY;  Surgeon: Gearlean Alf, MD;  Location: [facility];  Service: Orthopedics;  Laterality: Right;  . TOTAL KNEE ARTHROPLASTY Left 09/16/2012   Procedure: LEFT TOTAL KNEE ARTHROPLASTY;  Surgeon: Gearlean Alf, MD;  Location: [facility];  Service: Orthopedics;  Laterality: Left;    Current Medications: No outpatient medications have been marked as taking for the 06/12/19 encounter (Office Visit) with Almyra Deforest, [place].     Allergies:   Patient has no known allergies.   Social History   Socioeconomic History  . Marital status: Married    Spouse name: Not on file  . Number of children: 2  . Years of education: College  . Highest education level: Not on file  Occupational History  . Occupation: Research scientist (life sciences)  Tobacco Use  . Smoking status: Never Smoker  . Smokeless tobacco: Never Used  Substance and Sexual Activity  . Alcohol use: No  . Drug use: No  . Sexual activity: Not Currently  Other Topics Concern  . Not on file   Social History Narrative   Married 1956.  2 children. No grandkids. Neither children married.    1 daughter lives at house and watches over parents.       Retired from [employer]. Worked for hisself afterwards.       Regular exercise: no   Daily Caffeine: 3 cups coffee daily   Social Determinants of Health   Financial Resource Strain:   . Difficulty of Paying Living Expenses: Not on file  Food Insecurity:   . Worried About Charity fundraiser in the Last Year: Not on file  . Ran Out of Food in the Last Year: Not on file  Transportation Needs:   . Lack of Transportation (Medical): Not on file  . Lack of Transportation (Non-Medical): Not on file  Physical Activity:   . Days of Exercise per Week: Not on file  . Minutes of Exercise per Session: Not on file  Stress:   . Feeling of Stress : Not on file  Social Connections:   . Frequency of Communication with Friends and Family: Not on file  . Frequency of Social Gatherings with Friends and Family: Not on file  . Attends Religious Services: Not on file  . Active Member of Clubs or Organizations: Not on file  . Attends Archivist Meetings: Not on file  . Marital Status: Not on file     Family History: The patient's family history includes Cancer in his sister; Cirrhosis in his mother; Heart disease in his maternal grandfather; Hypertension in his father; Stroke in his father, maternal grandmother, paternal grandfather, and paternal grandmother. There is no history of Anesthesia problems, Hypotension, Malignant hyperthermia, or Pseudochol deficiency.  ROS:   Please see the history of present illness.     All other systems reviewed and are negative.  EKGs/Labs/Other Studies Reviewed:    The following studies were reviewed today:   EKG:  EKG is ordered today.  The ekg ordered today demonstrates NSR with TWI in inferolateral leads  Recent Labs: 11/28/2018: ALT 22; Hemoglobin 11.9; Platelets  224.0 12/04/2018: BUN 27; Creatinine, Ser 1.12; Potassium 4.3; Sodium 136  Recent Lipid Panel    Component Value Date/Time   CHOL 134 11/28/2018 1154   TRIG 160.0 (H) 11/28/2018 1154   TRIG 56 03/23/2006 0923   HDL 36.90 (L) 11/28/2018 1154   CHOLHDL 4 11/28/2018 1154   VLDL 32.0 11/28/2018 1154   LDLCALC 65 11/28/2018 1154   LDLDIRECT 74.0 02/26/2018 1424    Physical Exam:    VS:  BP 120/80   Pulse 65   Temp (!) 97.3 F (36.3 C)   Ht 5\' 5"  (1.651 m)   Wt 171 lb 9.6 oz (77.8  kg)   SpO2 97%   BMI 28.56 kg/m     Wt Readings from Last 3 Encounters:  06/12/19 171 lb 9.6 oz (77.8 kg)  03/17/19 161 lb 12.8 oz (73.4 kg)  01/21/19 163 lb 9.6 oz (74.2 kg)     GEN:  Well nourished, well developed in no acute distress HEENT: Normal NECK: No JVD; No carotid bruits LYMPHATICS: No lymphadenopathy CARDIAC: RRR, no murmurs, rubs, gallops RESPIRATORY:  Clear to auscultation without rales, wheezing or rhonchi  ABDOMEN: Soft, non-tender, non-distended MUSCULOSKELETAL:  No edema; No deformity  SKIN: Warm and dry NEUROLOGIC:  Alert and oriented x 3 PSYCHIATRIC:  Normal affect   ASSESSMENT:    1. PVC's (premature ventricular contractions)   2. Coronary artery disease involving coronary bypass graft of native heart without angina pectoris   3. Hyperlipidemia LDL goal <70   4. Controlled type 2 diabetes mellitus without complication, without long-term current use of insulin (HCC)    PLAN:    In order of problems listed above:  1. PVCs: Controlled on metoprolol   2. CAD: Denies any chest pain.  On aspirin, metoprolol and Lipitor  3. Hyperlipidemia: On Lipitor 80 mg daily  4. DM2: Managed by primary care provider   Medication Adjustments/Labs and Tests Ordered: Current medicines are reviewed at length with the patient today.  Concerns regarding medicines are outlined above.  No orders of the defined types were placed in this encounter.  No orders of the defined types were  placed in this encounter.   Patient Instructions  Medication Instructions:  Your physician recommends that you continue on your current medications as directed. Please refer to the Current Medication list given to you today.  *If you need a refill on your cardiac medications before your next appointment, please call your pharmacy*  Lab Work: NONE  Testing/Procedures: Your physician has requested that you have a carotid duplex in JUNE. This test is an ultrasound of the carotid arteries in your neck. It looks at blood flow through these arteries that supply the brain with blood. Allow one hour for this exam. There are no restrictions or special instructions.  Follow-Up: At [facility], you and your health needs are our priority.  As part of our continuing mission to provide you with exceptional heart care, we have created designated Provider Care Teams.  These Care Teams include your primary Cardiologist (physician) and Advanced Practice Providers (APPs -  Physician Assistants and Nurse Practitioners) who all work together to provide you with the care you need, when you need it.  We recommend signing up for the patient portal called "MyChart".  Sign up information is provided on this After Visit Summary.  MyChart is used to connect with patients for Virtual Visits (Telemedicine).  Patients are able to view lab/test results, encounter notes, upcoming appointments, etc.  Non-urgent messages can be sent to your provider as well.   To learn more about what you can do with MyChart, go to [email or web address].    Your next appointment:   6 month(s)  The format for your next appointment:   In Person  Provider:   K. Mali Hilty, MD       Signed, Almyra Deforest, [place]  06/14/2019 11:55 PM    [place]

## 2019-06-12 NOTE — Patient Instructions (Signed)
Medication Instructions:  Your physician recommends that you continue on your current medications as directed. Please refer to the Current Medication list given to you today.  *If you need a refill on your cardiac medications before your next appointment, please call your pharmacy*  Lab Work: NONE  Testing/Procedures: Your physician has requested that you have a carotid duplex in JUNE. This test is an ultrasound of the carotid arteries in your neck. It looks at blood flow through these arteries that supply the brain with blood. Allow one hour for this exam. There are no restrictions or special instructions.  Follow-Up: At [facility], you and your health needs are our priority.  As part of our continuing mission to provide you with exceptional heart care, we have created designated Provider Care Teams.  These Care Teams include your primary Cardiologist (physician) and Advanced Practice Providers (APPs -  Physician Assistants and Nurse Practitioners) who all work together to provide you with the care you need, when you need it.  We recommend signing up for the patient portal called "MyChart".  Sign up information is provided on this After Visit Summary.  MyChart is used to connect with patients for Virtual Visits (Telemedicine).  Patients are able to view lab/test results, encounter notes, upcoming appointments, etc.  Non-urgent messages can be sent to your provider as well.   To learn more about what you can do with MyChart, go to [email or web address].    Your next appointment:   6 month(s)  The format for your next appointment:   In Person  Provider:   K. Mali Hilty, MD

## 2019-06-13 DIAGNOSIS — M810 Age-related osteoporosis without current pathological fracture: Secondary | ICD-10-CM | POA: Diagnosis not present

## 2019-06-13 DIAGNOSIS — I2510 Atherosclerotic heart disease of native coronary artery without angina pectoris: Secondary | ICD-10-CM | POA: Diagnosis not present

## 2019-06-13 DIAGNOSIS — I10 Essential (primary) hypertension: Secondary | ICD-10-CM | POA: Diagnosis not present

## 2019-06-13 DIAGNOSIS — Z7984 Long term (current) use of oral hypoglycemic drugs: Secondary | ICD-10-CM | POA: Diagnosis not present

## 2019-06-13 DIAGNOSIS — E119 Type 2 diabetes mellitus without complications: Secondary | ICD-10-CM | POA: Diagnosis not present

## 2019-06-13 DIAGNOSIS — Z951 Presence of aortocoronary bypass graft: Secondary | ICD-10-CM | POA: Diagnosis not present

## 2019-06-13 DIAGNOSIS — L89323 Pressure ulcer of left buttock, stage 3: Secondary | ICD-10-CM | POA: Diagnosis not present

## 2019-06-13 DIAGNOSIS — R2681 Unsteadiness on feet: Secondary | ICD-10-CM | POA: Diagnosis not present

## 2019-06-14 ENCOUNTER — Encounter: Payer: Self-pay | Admitting: Physician Assistant

## 2019-06-16 DIAGNOSIS — L89323 Pressure ulcer of left buttock, stage 3: Secondary | ICD-10-CM | POA: Diagnosis not present

## 2019-06-16 DIAGNOSIS — I10 Essential (primary) hypertension: Secondary | ICD-10-CM | POA: Diagnosis not present

## 2019-06-16 DIAGNOSIS — I2510 Atherosclerotic heart disease of native coronary artery without angina pectoris: Secondary | ICD-10-CM | POA: Diagnosis not present

## 2019-06-16 DIAGNOSIS — R2681 Unsteadiness on feet: Secondary | ICD-10-CM | POA: Diagnosis not present

## 2019-06-16 DIAGNOSIS — Z951 Presence of aortocoronary bypass graft: Secondary | ICD-10-CM | POA: Diagnosis not present

## 2019-06-16 DIAGNOSIS — E119 Type 2 diabetes mellitus without complications: Secondary | ICD-10-CM | POA: Diagnosis not present

## 2019-06-16 DIAGNOSIS — M810 Age-related osteoporosis without current pathological fracture: Secondary | ICD-10-CM | POA: Diagnosis not present

## 2019-06-16 DIAGNOSIS — Z7984 Long term (current) use of oral hypoglycemic drugs: Secondary | ICD-10-CM | POA: Diagnosis not present

## 2019-06-18 DIAGNOSIS — L89323 Pressure ulcer of left buttock, stage 3: Secondary | ICD-10-CM | POA: Diagnosis not present

## 2019-06-19 DIAGNOSIS — I10 Essential (primary) hypertension: Secondary | ICD-10-CM | POA: Diagnosis not present

## 2019-06-19 DIAGNOSIS — R2681 Unsteadiness on feet: Secondary | ICD-10-CM | POA: Diagnosis not present

## 2019-06-19 DIAGNOSIS — M810 Age-related osteoporosis without current pathological fracture: Secondary | ICD-10-CM | POA: Diagnosis not present

## 2019-06-19 DIAGNOSIS — Z951 Presence of aortocoronary bypass graft: Secondary | ICD-10-CM | POA: Diagnosis not present

## 2019-06-19 DIAGNOSIS — I2510 Atherosclerotic heart disease of native coronary artery without angina pectoris: Secondary | ICD-10-CM | POA: Diagnosis not present

## 2019-06-19 DIAGNOSIS — L89323 Pressure ulcer of left buttock, stage 3: Secondary | ICD-10-CM | POA: Diagnosis not present

## 2019-06-19 DIAGNOSIS — E119 Type 2 diabetes mellitus without complications: Secondary | ICD-10-CM | POA: Diagnosis not present

## 2019-06-19 DIAGNOSIS — Z7984 Long term (current) use of oral hypoglycemic drugs: Secondary | ICD-10-CM | POA: Diagnosis not present

## 2019-06-20 DIAGNOSIS — I2510 Atherosclerotic heart disease of native coronary artery without angina pectoris: Secondary | ICD-10-CM | POA: Diagnosis not present

## 2019-06-20 DIAGNOSIS — R2681 Unsteadiness on feet: Secondary | ICD-10-CM | POA: Diagnosis not present

## 2019-06-20 DIAGNOSIS — M810 Age-related osteoporosis without current pathological fracture: Secondary | ICD-10-CM | POA: Diagnosis not present

## 2019-06-20 DIAGNOSIS — L89323 Pressure ulcer of left buttock, stage 3: Secondary | ICD-10-CM | POA: Diagnosis not present

## 2019-06-20 DIAGNOSIS — Z7984 Long term (current) use of oral hypoglycemic drugs: Secondary | ICD-10-CM | POA: Diagnosis not present

## 2019-06-20 DIAGNOSIS — I10 Essential (primary) hypertension: Secondary | ICD-10-CM | POA: Diagnosis not present

## 2019-06-20 DIAGNOSIS — Z951 Presence of aortocoronary bypass graft: Secondary | ICD-10-CM | POA: Diagnosis not present

## 2019-06-20 DIAGNOSIS — E119 Type 2 diabetes mellitus without complications: Secondary | ICD-10-CM | POA: Diagnosis not present

## 2019-06-24 DIAGNOSIS — Z951 Presence of aortocoronary bypass graft: Secondary | ICD-10-CM | POA: Diagnosis not present

## 2019-06-24 DIAGNOSIS — R2681 Unsteadiness on feet: Secondary | ICD-10-CM | POA: Diagnosis not present

## 2019-06-24 DIAGNOSIS — L89323 Pressure ulcer of left buttock, stage 3: Secondary | ICD-10-CM | POA: Diagnosis not present

## 2019-06-24 DIAGNOSIS — M810 Age-related osteoporosis without current pathological fracture: Secondary | ICD-10-CM | POA: Diagnosis not present

## 2019-06-24 DIAGNOSIS — I10 Essential (primary) hypertension: Secondary | ICD-10-CM | POA: Diagnosis not present

## 2019-06-24 DIAGNOSIS — Z7984 Long term (current) use of oral hypoglycemic drugs: Secondary | ICD-10-CM | POA: Diagnosis not present

## 2019-06-24 DIAGNOSIS — I2510 Atherosclerotic heart disease of native coronary artery without angina pectoris: Secondary | ICD-10-CM | POA: Diagnosis not present

## 2019-06-24 DIAGNOSIS — E119 Type 2 diabetes mellitus without complications: Secondary | ICD-10-CM | POA: Diagnosis not present

## 2019-06-26 DIAGNOSIS — L89323 Pressure ulcer of left buttock, stage 3: Secondary | ICD-10-CM | POA: Diagnosis not present

## 2019-06-26 DIAGNOSIS — Z951 Presence of aortocoronary bypass graft: Secondary | ICD-10-CM | POA: Diagnosis not present

## 2019-06-26 DIAGNOSIS — R2681 Unsteadiness on feet: Secondary | ICD-10-CM | POA: Diagnosis not present

## 2019-06-26 DIAGNOSIS — M810 Age-related osteoporosis without current pathological fracture: Secondary | ICD-10-CM | POA: Diagnosis not present

## 2019-06-26 DIAGNOSIS — E119 Type 2 diabetes mellitus without complications: Secondary | ICD-10-CM | POA: Diagnosis not present

## 2019-06-26 DIAGNOSIS — I2510 Atherosclerotic heart disease of native coronary artery without angina pectoris: Secondary | ICD-10-CM | POA: Diagnosis not present

## 2019-06-26 DIAGNOSIS — I10 Essential (primary) hypertension: Secondary | ICD-10-CM | POA: Diagnosis not present

## 2019-06-26 DIAGNOSIS — Z7984 Long term (current) use of oral hypoglycemic drugs: Secondary | ICD-10-CM | POA: Diagnosis not present

## 2019-06-30 DIAGNOSIS — M810 Age-related osteoporosis without current pathological fracture: Secondary | ICD-10-CM | POA: Diagnosis not present

## 2019-06-30 DIAGNOSIS — I10 Essential (primary) hypertension: Secondary | ICD-10-CM | POA: Diagnosis not present

## 2019-06-30 DIAGNOSIS — E119 Type 2 diabetes mellitus without complications: Secondary | ICD-10-CM | POA: Diagnosis not present

## 2019-06-30 DIAGNOSIS — R2681 Unsteadiness on feet: Secondary | ICD-10-CM | POA: Diagnosis not present

## 2019-06-30 DIAGNOSIS — I2510 Atherosclerotic heart disease of native coronary artery without angina pectoris: Secondary | ICD-10-CM | POA: Diagnosis not present

## 2019-06-30 DIAGNOSIS — Z7984 Long term (current) use of oral hypoglycemic drugs: Secondary | ICD-10-CM | POA: Diagnosis not present

## 2019-06-30 DIAGNOSIS — Z951 Presence of aortocoronary bypass graft: Secondary | ICD-10-CM | POA: Diagnosis not present

## 2019-06-30 DIAGNOSIS — L89323 Pressure ulcer of left buttock, stage 3: Secondary | ICD-10-CM | POA: Diagnosis not present

## 2019-07-03 DIAGNOSIS — Z951 Presence of aortocoronary bypass graft: Secondary | ICD-10-CM | POA: Diagnosis not present

## 2019-07-03 DIAGNOSIS — E119 Type 2 diabetes mellitus without complications: Secondary | ICD-10-CM | POA: Diagnosis not present

## 2019-07-03 DIAGNOSIS — I2510 Atherosclerotic heart disease of native coronary artery without angina pectoris: Secondary | ICD-10-CM | POA: Diagnosis not present

## 2019-07-03 DIAGNOSIS — L89323 Pressure ulcer of left buttock, stage 3: Secondary | ICD-10-CM | POA: Diagnosis not present

## 2019-07-03 DIAGNOSIS — Z7984 Long term (current) use of oral hypoglycemic drugs: Secondary | ICD-10-CM | POA: Diagnosis not present

## 2019-07-03 DIAGNOSIS — I10 Essential (primary) hypertension: Secondary | ICD-10-CM | POA: Diagnosis not present

## 2019-07-03 DIAGNOSIS — R2681 Unsteadiness on feet: Secondary | ICD-10-CM | POA: Diagnosis not present

## 2019-07-03 DIAGNOSIS — M810 Age-related osteoporosis without current pathological fracture: Secondary | ICD-10-CM | POA: Diagnosis not present

## 2019-07-11 DIAGNOSIS — Z951 Presence of aortocoronary bypass graft: Secondary | ICD-10-CM | POA: Diagnosis not present

## 2019-07-11 DIAGNOSIS — M810 Age-related osteoporosis without current pathological fracture: Secondary | ICD-10-CM | POA: Diagnosis not present

## 2019-07-11 DIAGNOSIS — Z7984 Long term (current) use of oral hypoglycemic drugs: Secondary | ICD-10-CM | POA: Diagnosis not present

## 2019-07-11 DIAGNOSIS — I2510 Atherosclerotic heart disease of native coronary artery without angina pectoris: Secondary | ICD-10-CM | POA: Diagnosis not present

## 2019-07-11 DIAGNOSIS — I10 Essential (primary) hypertension: Secondary | ICD-10-CM | POA: Diagnosis not present

## 2019-07-11 DIAGNOSIS — R2681 Unsteadiness on feet: Secondary | ICD-10-CM | POA: Diagnosis not present

## 2019-07-11 DIAGNOSIS — E119 Type 2 diabetes mellitus without complications: Secondary | ICD-10-CM | POA: Diagnosis not present

## 2019-07-11 DIAGNOSIS — L89323 Pressure ulcer of left buttock, stage 3: Secondary | ICD-10-CM | POA: Diagnosis not present

## 2019-07-14 DIAGNOSIS — L89323 Pressure ulcer of left buttock, stage 3: Secondary | ICD-10-CM | POA: Diagnosis not present

## 2019-07-14 DIAGNOSIS — M810 Age-related osteoporosis without current pathological fracture: Secondary | ICD-10-CM | POA: Diagnosis not present

## 2019-07-14 DIAGNOSIS — I2510 Atherosclerotic heart disease of native coronary artery without angina pectoris: Secondary | ICD-10-CM | POA: Diagnosis not present

## 2019-07-14 DIAGNOSIS — R2681 Unsteadiness on feet: Secondary | ICD-10-CM | POA: Diagnosis not present

## 2019-07-14 DIAGNOSIS — I10 Essential (primary) hypertension: Secondary | ICD-10-CM | POA: Diagnosis not present

## 2019-07-14 DIAGNOSIS — Z951 Presence of aortocoronary bypass graft: Secondary | ICD-10-CM | POA: Diagnosis not present

## 2019-07-14 DIAGNOSIS — E119 Type 2 diabetes mellitus without complications: Secondary | ICD-10-CM | POA: Diagnosis not present

## 2019-07-14 DIAGNOSIS — Z7984 Long term (current) use of oral hypoglycemic drugs: Secondary | ICD-10-CM | POA: Diagnosis not present

## 2019-07-16 ENCOUNTER — Telehealth: Payer: Self-pay | Admitting: Family Medicine

## 2019-07-16 NOTE — Telephone Encounter (Addendum)
Pt daughter called requesting to write pt an order for pt to be seen at vein specialist to get ultra sound done on aneurism. Please advise.

## 2019-07-16 NOTE — Telephone Encounter (Signed)
See below

## 2019-07-18 ENCOUNTER — Telehealth: Payer: Self-pay | Admitting: Family Medicine

## 2019-07-18 DIAGNOSIS — M810 Age-related osteoporosis without current pathological fracture: Secondary | ICD-10-CM | POA: Diagnosis not present

## 2019-07-18 DIAGNOSIS — Z951 Presence of aortocoronary bypass graft: Secondary | ICD-10-CM | POA: Diagnosis not present

## 2019-07-18 DIAGNOSIS — E119 Type 2 diabetes mellitus without complications: Secondary | ICD-10-CM | POA: Diagnosis not present

## 2019-07-18 DIAGNOSIS — L89323 Pressure ulcer of left buttock, stage 3: Secondary | ICD-10-CM | POA: Diagnosis not present

## 2019-07-18 DIAGNOSIS — R2681 Unsteadiness on feet: Secondary | ICD-10-CM | POA: Diagnosis not present

## 2019-07-18 DIAGNOSIS — I10 Essential (primary) hypertension: Secondary | ICD-10-CM | POA: Diagnosis not present

## 2019-07-18 DIAGNOSIS — I2510 Atherosclerotic heart disease of native coronary artery without angina pectoris: Secondary | ICD-10-CM | POA: Diagnosis not present

## 2019-07-18 DIAGNOSIS — Z7984 Long term (current) use of oral hypoglycemic drugs: Secondary | ICD-10-CM | POA: Diagnosis not present

## 2019-07-18 NOTE — Telephone Encounter (Signed)
Home health called to report right hip pain and radiating pain to his ankle. Notes that when he stands up the pain radiates from the back of his leg to his ankle. The home health provider advised the family she would let us know. He noted no numbness or weakness. No falls. Notes this has been an issue in the past. I will forward to the patients PCP to see if he wants to complete a visit with the patient next week.

## 2019-07-19 NOTE — Telephone Encounter (Signed)
Dr. Caryl Bis- thanks. Looks like he has a visit on 07/22/19 and we will chat at that time unless he needs [place] sooner

## 2019-07-21 DIAGNOSIS — Z951 Presence of aortocoronary bypass graft: Secondary | ICD-10-CM | POA: Diagnosis not present

## 2019-07-21 DIAGNOSIS — E119 Type 2 diabetes mellitus without complications: Secondary | ICD-10-CM | POA: Diagnosis not present

## 2019-07-21 DIAGNOSIS — I10 Essential (primary) hypertension: Secondary | ICD-10-CM | POA: Diagnosis not present

## 2019-07-21 DIAGNOSIS — R2681 Unsteadiness on feet: Secondary | ICD-10-CM | POA: Diagnosis not present

## 2019-07-21 DIAGNOSIS — Z7984 Long term (current) use of oral hypoglycemic drugs: Secondary | ICD-10-CM | POA: Diagnosis not present

## 2019-07-21 DIAGNOSIS — M810 Age-related osteoporosis without current pathological fracture: Secondary | ICD-10-CM | POA: Diagnosis not present

## 2019-07-21 DIAGNOSIS — L89323 Pressure ulcer of left buttock, stage 3: Secondary | ICD-10-CM | POA: Diagnosis not present

## 2019-07-21 DIAGNOSIS — I2510 Atherosclerotic heart disease of native coronary artery without angina pectoris: Secondary | ICD-10-CM | POA: Diagnosis not present

## 2019-07-22 ENCOUNTER — Encounter: Payer: Self-pay | Admitting: Family Medicine

## 2019-07-22 ENCOUNTER — Other Ambulatory Visit: Payer: Self-pay

## 2019-07-22 ENCOUNTER — Ambulatory Visit (INDEPENDENT_AMBULATORY_CARE_PROVIDER_SITE_OTHER): Payer: Medicare Other | Admitting: Family Medicine

## 2019-07-22 VITALS — BP 130/74 | HR 65 | Temp 98.6°F | Ht 65.0 in | Wt 155.8 lb

## 2019-07-22 DIAGNOSIS — Z0000 Encounter for general adult medical examination without abnormal findings: Secondary | ICD-10-CM

## 2019-07-22 DIAGNOSIS — M25551 Pain in right hip: Secondary | ICD-10-CM

## 2019-07-22 DIAGNOSIS — D692 Other nonthrombocytopenic purpura: Secondary | ICD-10-CM

## 2019-07-22 DIAGNOSIS — R531 Weakness: Secondary | ICD-10-CM

## 2019-07-22 DIAGNOSIS — E119 Type 2 diabetes mellitus without complications: Secondary | ICD-10-CM

## 2019-07-22 DIAGNOSIS — I714 Abdominal aortic aneurysm, without rupture, unspecified: Secondary | ICD-10-CM

## 2019-07-22 MED ORDER — DILTIAZEM HCL ER COATED BEADS 180 MG PO TB24: 180.0000 mg | ORAL_TABLET | Freq: Every day | ORAL | 1 refills | Status: DC

## 2019-07-22 MED ORDER — GLIMEPIRIDE 1 MG PO TABS: 1.0000 mg | ORAL_TABLET | Freq: Every day | ORAL | 3 refills | Status: DC

## 2019-07-22 NOTE — Patient Instructions (Addendum)
Schedule sports medicine visit at the check out desk  We will call you within two weeks about your referral to home health. If you do not hear within 3 weeks, give [place] a call.   Schedule a lab visit at the check out desk within 2 weeks. Return for future fasting labs meaning nothing but water after midnight please. Ok to take your medications with water.   Check with cardiology to see if they want to do another echocardiogram  Due for tdap and shingrix- focus on getting covid vaccine and consider tdap and shingrix at pharmacy in future 2 weeks after finishing covid vaccine  Recommended follow up: Return in about 6 weeks (around 09/02/2019) for follow up- or sooner if needed.

## 2019-07-22 NOTE — Progress Notes (Signed)
Phone: [phone]   Subjective:  Patient presents today for their annual physical. Chief complaint-noted.   See problem oriented charting- Review of Systems  Constitutional: Negative for chills and fever.  HENT: Positive for nosebleeds. Negative for ear discharge, ear pain, hearing loss and sore throat.   Eyes: Negative for blurred vision and double vision.  Respiratory: Negative for cough, shortness of breath and wheezing.   Cardiovascular: Negative for chest pain and palpitations.  Gastrointestinal: Positive for heartburn. Negative for diarrhea and nausea.  Genitourinary: Positive for frequency. Negative for dysuria and urgency.  Musculoskeletal: Positive for falls and neck pain. Negative for back pain and joint pain.  Skin: Negative for itching and rash.  Neurological: Negative for dizziness, seizures and headaches.  Endo/Heme/Allergies: Bruises/bleeds easily.  Psychiatric/Behavioral: Negative for depression, memory loss and suicidal ideas. The patient does not have insomnia.    The following were reviewed and entered/updated in epic: Past Medical History:  Diagnosis Date  . Abnormality of gait 08/23/2012  . Anemia    hx of  . Arthritis 01-03-12   osteoarthritis-knees  . CAD (coronary artery disease) 2004   s/p inferior wall Mi 2004 with PTCA/Stent; s/p CABG 2004  . Degenerative arthritis   . Diabetes mellitus    type II  . Foot drop, bilateral 08/23/2012  . GERD (gastroesophageal reflux disease)   . Heart disease   . History of kidney stones   . History of myocardial infarction   . Hyperlipidemia   . Myocardial infarction [facility]) 01-03-12   '04  . MYOCARDIAL INFARCTION, HX OF 04/19/2007   Qualifier: Diagnosis of  By: Scherrie Gerlach    . Neuropathy 01-03-12   bil. feet  . Pneumonia    hx of  . Polyneuropathy in diabetes(357.2) 08/23/2012  . Radiculopathy of lumbar region 08/23/2011   Followed by Dr. Vertell Limber. S/p surgery. Resolved after surgery.     Patient Active Problem  List   Diagnosis Date Noted  . AAA (abdominal aortic aneurysm) ([place]) 12/13/2018    Priority: High  . Falls frequently 08/21/2017    Priority: High  . Aortic root dilatation ([place]) 10/06/2016    Priority: High  . Osteoporosis 01/07/2015    Priority: High  . CAD, ARTERY BYPASS GRAFT 05/21/2009    Priority: High  . Carotid artery stenosis 04/20/2008    Priority: High  . Diabetes mellitus type II, controlled ([place]) 04/19/2007    Priority: High  . Anemia 09/03/2013    Priority: Medium  . Essential hypertension, benign 10/11/2012    Priority: Medium  . Hyperlipidemia 04/19/2007    Priority: Medium  . Seborrheic dermatitis 09/13/2017    Priority: Low  . Senile purpura ([place]) 08/21/2017    Priority: Low  . History of acute inferior wall MI 09/03/2015    Priority: Low  . Foot drop, left 08/23/2012    Priority: Low  . History of supraventricular tachycardia 01/25/2011    Priority: Low  . Hypogonadism in male 02/04/2010    Priority: Low  . History of colonic polyps 09/07/2009    Priority: Low  . GERD 04/19/2007    Priority: Low  . Hx of CABG 12/13/2017  . PVC's (premature ventricular contractions) 12/13/2017   Past Surgical History:  Procedure Laterality Date  . BACK SURGERY  2013  . CARDIAC CATHETERIZATION  01-03-12   '04  . CATARACT EXTRACTION Bilateral 2015  . COLONOSCOPY    . CORONARY ARTERY BYPASS GRAFT  2004   LIMA to LAD; SVG to ramus intermedius;  SVG to PDA/PLSA  . LAMINECTOMY  09/08/2011   Procedure: LUMBAR LAMINECTOMY FOR TUMOR;  Surgeon: Erline Levine, MD;  Location: [facility];  Service: Neurosurgery;  Laterality: Left;  Left Thoracic twelve-Lumbar one transpedicular resection of epidural mass  . TONSILLECTOMY  as child  . TOTAL KNEE ARTHROPLASTY  01/15/2012   Procedure: TOTAL KNEE ARTHROPLASTY;  Surgeon: Gearlean Alf, MD;  Location: [facility];  Service: Orthopedics;  Laterality: Right;  . TOTAL KNEE ARTHROPLASTY Left 09/16/2012   Procedure: LEFT TOTAL KNEE  ARTHROPLASTY;  Surgeon: Gearlean Alf, MD;  Location: [facility];  Service: Orthopedics;  Laterality: Left;    Family History  Problem Relation Age of Onset  . Cirrhosis Mother        etiology unclear  . Hypertension Father   . Stroke Father   . Cancer Sister        breast  . Stroke Maternal Grandmother   . Heart disease Maternal Grandfather        MI  . Stroke Paternal Grandmother   . Stroke Paternal Grandfather   . Anesthesia problems Neg Hx   . Hypotension Neg Hx   . Malignant hyperthermia Neg Hx   . Pseudochol deficiency Neg Hx     Medications- reviewed and updated Current Outpatient Medications  Medication Sig Dispense Refill  . alendronate (FOSAMAX) 70 MG tablet TAKE 1 TABLET BY MOUTH EVERY 7 DAYS WITH A FULL GLASS OF WATER ON AN EMPTY STOMACH 12 tablet 4  . aspirin 81 MG tablet Take 81 mg by mouth daily.    Marland Kitchen atorvastatin (LIPITOR) 80 MG tablet TAKE 1 TABLET DAILY. 90 tablet 3  . cycloSPORINE (RESTASIS) 0.05 % ophthalmic emulsion 1 drop 2 (two) times daily.    . diclofenac sodium (VOLTAREN) 1 % GEL Apply 4 g topically 4 (four) times daily. 100 g 1  . diltiazem (MATZIM LA) 180 MG 24 hr tablet Take 1 tablet (180 mg total) by mouth daily. 90 tablet 1  . glimepiride (AMARYL) 1 MG tablet Take 1 tablet (1 mg total) by mouth daily with breakfast. 90 tablet 3  . glucose blood test strip Use to test your blood sugar daily. E11.9 One touch Verio test strips 100 each 12  . hydrocortisone 2.5 % cream APPLICATIONS APPLY ON THE SKIN EVERY DAY    . ketoconazole (NIZORAL) 2 % shampoo APPLY 2 TIMES A WEEK 120 mL 3  . Lancets (ONETOUCH ULTRASOFT) lancets USE TO TEST BLOOD SUGARS DAILY. 100 each 3  . metFORMIN (GLUCOPHAGE) 1000 MG tablet Take 1 tablet (1,000 mg total) by mouth 2 (two) times daily with a meal. 60 tablet 2  . metoprolol succinate (TOPROL-XL) 50 MG 24 hr tablet TAKE 1 TABLET BY MOUTH EVERY DAY 90 tablet 1  . omeprazole (PRILOSEC) 20 MG capsule Take 1 capsule (20 mg total) by  mouth daily. (Patient taking differently: Take 20 mg by mouth as needed. ) 90 capsule 3  . ONETOUCH VERIO test strip USE TO TEST BLOOD SUGARS DAILY. 100 each 3   No current facility-administered medications for this visit.    Allergies-reviewed and updated No Known Allergies  Social History   Social History Narrative   Married 1956.  2 children. No grandkids. Neither children married.    1 daughter lives at house and watches over parents.       Retired from [employer]. Worked for hisself afterwards.       Regular exercise: no   Daily Caffeine: 3 cups coffee  daily   Objective  Objective:  BP 130/74   Pulse 65   Temp 98.6 F (37 C) (Temporal)   Ht 5\' 5"  (1.651 m)   Wt 191 lb 12.8 oz (87 kg)   SpO2 96%   BMI 31.92 kg/m  Gen: NAD, resting comfortably HEENT: Mucous membranes are moist. Oropharynx normal Neck: no thyromegaly CV: RRR no murmurs rubs or gallops Lungs: CTAB no crackles, wheeze, rhonchi Abdomen: soft/nontender/nondistended/normal bowel sounds. No rebound or guarding.  Ext: trace to 1+ edema Skin: warm, dry Neuro: grossly normal, moves all extremities, PERRLA  Declines foot exam today due to braces on legs    Assessment and Plan  86 y.o. male presenting for annual physical.  Health Maintenance counseling: 1. Anticipatory guidance: Patient counseled regarding regular dental exams q6 months, eye exams yearly,  avoiding smoking and second hand smoke, limiting alcohol to 2 beverages per day - does not drink  2. Risk factor reduction:  Advised patient of need for regular exercise and diet rich and fruits and vegetables to reduce risk of heart attack and stroke. Exercise- minimal chair exercises- hoping to increase with PT . Diet- diabetic diet- weight is up 10 lbs but weighed with wheelchair. Aunt is spoiling him somewhat with sweets so weight may be up some Wt Readings from Last 3 Encounters:  07/22/19 155.8 without wheelchair  06/12/19 171 lb  9.6 oz (77.8 kg)- not on our scales  03/17/19 161 lb 12.8 oz (73.4 kg) last visit   3. Immunizations/screenings/ancillary studies- had 1 covid vaccine- focus on getting covid vaccine and consider tdap and shingrix at pharmacy in future 2 weeks after finishing covid vaccine Immunization History  Administered Date(s) Administered  . Fluad Quad(high Dose 65+) 12/26/2018  . Influenza Split 01/25/2011, 01/16/2012  . Influenza Whole 04/26/2007, 01/27/2008, 02/09/2009, 01/18/2010  . Influenza, High Dose Seasonal PF 01/06/2016, 02/07/2017, 01/30/2018  . Influenza,inj,Quad PF,6+ Mos 12/30/2012, 01/15/2014, 12/25/2014  . Pneumococcal Conjugate-13 02/03/2013  . Pneumococcal Polysaccharide-23 02/25/1998, 01/25/2011  . Td 02/25/1998, 12/22/2008  . Zoster 01/27/2008   Health Maintenance Due  Topic Date Due  . FOOT EXAM - declines today 03/29/2018  . HEMOGLOBIN A1C - with labs 05/31/2019   4. Prostate cancer screening- past age based screening  Lab Results  Component Value Date   PSA 1.01 02/03/2013   PSA 1.61 05/09/2010   PSA 1.11 01/18/2010   5. Colon cancer screening - never had abnormal colonoscopy per his report- past age based screening 6. Skin cancer screening- advised regular sunscreen use. Denies worrisome, changing, or new skin lesions.  7. non smoker  Status of chronic or acute concerns   When wakes up in the morning does not feel stable on his feet. Yesterday had a fall in the morning- hurt his neck and hand but states pain really is minimal. EMS saw him and he states pain is not bad enough to have to take tylenol at this time.  - discussed that he needs to make sure to move slowly and sit on side of bed first thing in AM and Encouraged to use walker 100% of the time even in the bathroom. Declines x-ray of right wrist  Has to navigate 3 stairs if going out back of home and 2 stairs front of home. PT/OT has ended and he feels weaker. Recommended he always use walker- he seems resistant  and wants to use cane at time. Once again recommended walker at all times.   Patient with pain in right hip/knee/ankle for about  6 months. Pain started back in fall before 2 rounds of physical therapy.  -refer to sports medicine for right hip pain  Has had generalized weakness and would like to restart PT- home health orders placed today  # Diabetes S: hopefullly controlled on Amaryl 1mg , metformin 1000 mg twice a day Lab Results  Component Value Date   HGBA1C 6.5 11/28/2018  CBGs- sugar was ok with fall yesterday  A/P: hopefully controlled- will return for a1c  #Stenosis of right carotid artery S: October 10, 2018 moderate right and mild left carotid artery stenosis with plan to repeat in 1 year per cardiology A/P: scheduled for follo wup- continue risk factor modification   #Atherosclerosis of coronary artery bypass graft of native heart without angina pectoris S: Patient follows regularly with Dr. Debara Pickett.  He is compliant with aspirin, statin, metoprolol.  CABG in 2004.  Today reports no chest pain or shortness of breath  A/P: appears stable - continue current meds and cardiology follow up   Aortic root dilatation ([place]) S: Last imaged October 19, 2016 through Dr. Maudie Flakes aortic root size at that time. A/P: he will check with cardiology if needs updated echocardiogram next visit   Abdominal aortic aneurysm (AAA) without rupture [facility]) S: August 2020 showed distal aortic aneurysm 4.4 cm.  Yearly follow-up needed likely if 6 month check stable.  Found incidentally during other work-up A/P: over 6 months since uncovered by Dr. Rogers Blocker- will order aneurysm follow up today   Mixed hyperlipidemia S: Patient is compliant with atorvastatin 80 mg.  LDL goal under 70 Lab Results  Component Value Date   CHOL 134 11/28/2018   HDL 36.90 (L) 11/28/2018   LDLCALC 65 11/28/2018   LDLDIRECT 74.0 02/26/2018   TRIG 160.0 (H) 11/28/2018   CHOLHDL 4 11/28/2018  A/P: hopefully controlled- continue  current medicine   Essential hypertension, benign S: Compliant with metoprolol 50 mg extended release, diltiazem 180 mg extended release A/P: Stable. Continue current medications.    #Osteoporosis S: Patient is compliant with Fosamax, calcium, vitamin D A/P:  Last exam 03/2017- they want to push this to next visit or 1 year  # senile purpura- easy bruising noted. Aspirin contributes  # pressure ulcer on buttocks- working with home health nursing it sound slike every 2 weeks- feels like improving  Recommended follow up: Return in about 6 weeks (around 09/02/2019) for follow up- or sooner if needed. Future Appointments  Date Time Provider [place]  09/29/2019  4:00 PM [facility] 3 [facility]   Lab/Order associations: non fasting- return fasting   ICD-10-CM   1. Right hip pain  M25.551 Ambulatory referral to Sports Medicine  2. Generalized weakness  R53.1 Ambulatory referral to Home Health    CANCELED: Ambulatory referral to Physical Therapy  3. Abdominal aortic aneurysm (AAA) without rupture ([place])  I71.4 VAS [place] AAA DUPLEX  4. Controlled type 2 diabetes mellitus without complication, without long-term current use of insulin ([facility])  E11.9 CBC with Differential/Platelet    Comprehensive metabolic panel    Lipid panel    Hemoglobin A1c    Meds ordered this encounter  Medications  . diltiazem (MATZIM LA) 180 MG 24 hr tablet    Sig: Take 1 tablet (180 mg total) by mouth daily.    Dispense:  90 tablet    Refill:  1  . glimepiride (AMARYL) 1 MG tablet    Sig: Take 1 tablet (1 mg total) by mouth daily with breakfast.  Dispense:  90 tablet    Refill:  3    Return precautions advised.  [name], MD

## 2019-07-23 ENCOUNTER — Telehealth (HOSPITAL_COMMUNITY): Payer: Self-pay

## 2019-07-23 DIAGNOSIS — R2681 Unsteadiness on feet: Secondary | ICD-10-CM

## 2019-07-23 DIAGNOSIS — Z951 Presence of aortocoronary bypass graft: Secondary | ICD-10-CM

## 2019-07-23 DIAGNOSIS — M810 Age-related osteoporosis without current pathological fracture: Secondary | ICD-10-CM | POA: Diagnosis not present

## 2019-07-23 DIAGNOSIS — Z7984 Long term (current) use of oral hypoglycemic drugs: Secondary | ICD-10-CM

## 2019-07-23 DIAGNOSIS — E119 Type 2 diabetes mellitus without complications: Secondary | ICD-10-CM

## 2019-07-23 DIAGNOSIS — I2510 Atherosclerotic heart disease of native coronary artery without angina pectoris: Secondary | ICD-10-CM | POA: Diagnosis not present

## 2019-07-23 DIAGNOSIS — I10 Essential (primary) hypertension: Secondary | ICD-10-CM | POA: Diagnosis not present

## 2019-07-23 DIAGNOSIS — L89323 Pressure ulcer of left buttock, stage 3: Secondary | ICD-10-CM | POA: Diagnosis not present

## 2019-07-23 NOTE — Telephone Encounter (Signed)

## 2019-07-24 DIAGNOSIS — I2510 Atherosclerotic heart disease of native coronary artery without angina pectoris: Secondary | ICD-10-CM | POA: Diagnosis not present

## 2019-07-24 DIAGNOSIS — Z951 Presence of aortocoronary bypass graft: Secondary | ICD-10-CM | POA: Diagnosis not present

## 2019-07-24 DIAGNOSIS — Z7984 Long term (current) use of oral hypoglycemic drugs: Secondary | ICD-10-CM | POA: Diagnosis not present

## 2019-07-24 DIAGNOSIS — R2681 Unsteadiness on feet: Secondary | ICD-10-CM | POA: Diagnosis not present

## 2019-07-24 DIAGNOSIS — E119 Type 2 diabetes mellitus without complications: Secondary | ICD-10-CM | POA: Diagnosis not present

## 2019-07-24 DIAGNOSIS — L89323 Pressure ulcer of left buttock, stage 3: Secondary | ICD-10-CM | POA: Diagnosis not present

## 2019-07-24 DIAGNOSIS — M810 Age-related osteoporosis without current pathological fracture: Secondary | ICD-10-CM | POA: Diagnosis not present

## 2019-07-24 DIAGNOSIS — I10 Essential (primary) hypertension: Secondary | ICD-10-CM | POA: Diagnosis not present

## 2019-07-25 ENCOUNTER — Inpatient Hospital Stay (HOSPITAL_COMMUNITY): Admission: RE | Admit: 2019-07-25 | Payer: Medicare Other | Source: Ambulatory Visit

## 2019-07-29 DIAGNOSIS — L89323 Pressure ulcer of left buttock, stage 3: Secondary | ICD-10-CM | POA: Diagnosis not present

## 2019-07-29 DIAGNOSIS — Z951 Presence of aortocoronary bypass graft: Secondary | ICD-10-CM | POA: Diagnosis not present

## 2019-07-29 DIAGNOSIS — R2681 Unsteadiness on feet: Secondary | ICD-10-CM | POA: Diagnosis not present

## 2019-07-29 DIAGNOSIS — I2510 Atherosclerotic heart disease of native coronary artery without angina pectoris: Secondary | ICD-10-CM | POA: Diagnosis not present

## 2019-07-29 DIAGNOSIS — M810 Age-related osteoporosis without current pathological fracture: Secondary | ICD-10-CM | POA: Diagnosis not present

## 2019-07-29 DIAGNOSIS — Z7984 Long term (current) use of oral hypoglycemic drugs: Secondary | ICD-10-CM | POA: Diagnosis not present

## 2019-07-29 DIAGNOSIS — E119 Type 2 diabetes mellitus without complications: Secondary | ICD-10-CM | POA: Diagnosis not present

## 2019-07-29 DIAGNOSIS — I10 Essential (primary) hypertension: Secondary | ICD-10-CM | POA: Diagnosis not present

## 2019-07-30 ENCOUNTER — Ambulatory Visit (INDEPENDENT_AMBULATORY_CARE_PROVIDER_SITE_OTHER): Payer: Medicare Other

## 2019-07-30 ENCOUNTER — Ambulatory Visit (INDEPENDENT_AMBULATORY_CARE_PROVIDER_SITE_OTHER): Payer: Medicare Other | Admitting: Family Medicine

## 2019-07-30 ENCOUNTER — Encounter: Payer: Self-pay | Admitting: Family Medicine

## 2019-07-30 ENCOUNTER — Other Ambulatory Visit: Payer: Self-pay

## 2019-07-30 VITALS — BP 138/70 | HR 68 | Ht 65.0 in | Wt 146.6 lb

## 2019-07-30 DIAGNOSIS — M79604 Pain in right leg: Secondary | ICD-10-CM

## 2019-07-30 DIAGNOSIS — R296 Repeated falls: Secondary | ICD-10-CM | POA: Diagnosis not present

## 2019-07-30 DIAGNOSIS — M21372 Foot drop, left foot: Secondary | ICD-10-CM

## 2019-07-30 DIAGNOSIS — R600 Localized edema: Secondary | ICD-10-CM | POA: Diagnosis not present

## 2019-07-30 MED ORDER — GABAPENTIN 100 MG PO CAPS: 100.0000 mg | ORAL_CAPSULE | Freq: Every evening | ORAL | 3 refills | Status: DC | PRN

## 2019-07-30 NOTE — Patient Instructions (Addendum)
Thank you for coming in today. Try gabapentin at bedtime.  Ok to increase from 1 pill to 3pills at a time.  Plan for MRI.  Call or go to the ER if you develop a large red swollen joint with extreme pain or oozing puss.  Recheck with me after MRI.   Get xrays on the way out.

## 2019-07-30 NOTE — Progress Notes (Signed)
Subjective:    CC: R hip pain  I, Steven [name], LAT, ATC, am serving as scribe for Dr. Lynne [name].  HPI: Pt is a 86 y/o male presenting w/ c/o R hip and leg pain x 6 months.  He was to see his PCP and was referred for home health PT.  He locates his pain to his R lateral hip into the thigh to the knee and into his R lower leg .  He rates his pain as severe and is constant.  He is a history of scoliosis as a child and severe DJD and degenerative scoliosis as well.  He has a history of laminectomy at T12-L1 for tumor fortunately pathology showed no cancer.  Diagnosis thought to be synovial cyst per records available today.  (See attached op report and pathology report from 2013 below and the appendix to this note ) He also has a history of bilateral foot drop occurring over the last several years following his total knee replacement bilaterally.  He has been using AFOs successfully for some time.  Main concerns today are persistent pain in the right lateral hip and pain radiating down the right leg crossing over the anterior knee to the medial ankle and anterior ankle.  Pain is interfering with ability to walk somewhat normally.  Patient is using a walker.  Secondary concerns are frequent falls.  Patient has had extensive trial of physical therapy with only moderate benefit.  Radiating pain: Yes in his entire R leg from hip to ankle R LE numbness/tingling: Yes in his R thigh Back pain: Yes Aggravating factors: First getting out of bed; transitioning from sit to stand Treatments tried: Voltaren; Tylenol  Diagnostic imaging: L-spine and R hip XR- 12/09/18  Pertinent review of Systems: No fevers or chills or unexplained weight loss.  Relevant historical information: History laminectomy T12-L1 as above and history of bilateral total knee replacement Hypertension carotid artery disease diabetes aortic aneurysm PVC.   Objective:    Vitals:   07/30/19 1548  BP: 138/70  Pulse: 68   SpO2: 97%   General: Well Developed, well nourished, and in no acute distress.   MSK:  L-spine: Scoliotic curvature present. Spine nontender to palpation midline. Decreased lumbar motion. Lower extremity strength is intact hip flexion knee extension knee flexion.  Reduced strength foot dorsiflexion bilaterally left worse than right. Right hip: Normal-appearing normal motion.  Tender palpation right lateral hip greater trochanter.  Hip greater trochanteric injection: Right Consent obtained and timeout performed. Area of maximum tenderness palpated and identified. Skin cleaned with alcohol, cold spray applied. A 22g needle was used to access the greater trochanteric bursa. 40 mg of kenalog and 3 mL of Marcaine were used to inject the trochanteric bursa. Patient tolerated the procedure well.   Lab and Radiology Results  EXAM: LUMBAR SPINE - 2-3 VIEW  COMPARISON:  Lumbar spine 09/08/2011.  MRI lumbar spine 07/14/2011.  FINDINGS: Prominent scoliosis lumbar spine concave left. Diffuse severe multilevel degenerative change again noted. No acute bony abnormality. No evidence of fracture. Abdominal aortic aneurysm appears to be present. Ultrasound of the abdominal aorta suggested for further evaluation.  IMPRESSION: 1. Prominent scoliosis lumbar spine concave left. Diffuse severe multilevel degenerative change again noted. Similar findings noted on prior exams. No acute bony abnormality identified.  2. Abdominal aortic aneurysm appears to be present. Ultrasound the abdominal aorta suggested for further evaluation.   Electronically Signed   By: Steven [name]  Register   On: 12/10/2018 06:21  EXAM:  DG HIP (WITH OR WITHOUT PELVIS) 2-3V RIGHT  COMPARISON:  Lumbar spine 09/08/2011.  FINDINGS: Degenerative changes lumbar spine and both hips. No acute bony abnormality. No evidence of fracture or dislocation.  IMPRESSION: Degenerative changes lumbar spine and both hips. No  acute abnormality.   Electronically Signed   By: Steven [name]  Register   On: 12/10/2018 06:22  I, Steven [name], personally (independently) visualized and performed the interpretation of the images attached in this note.  X-ray images obtained today right femur and right tib-fib personally and independently reviewed.  Right femur: No fractures or lytic lesions.  No evidence of hardware loosening on x-ray per my interpretation at right knee today.  Right tib-fib: No fractures.  Significant degenerative changes at right ankle.  No hardware loosening at TKR  Await formal radiology review   Impression and Recommendations:    Assessment and Plan: 86 y.o. male with right leg pain pain multifactorial.  Right lateral hip pain due to greater trochanteric bursitis/hip abductor tendinopathy.  Patient already had excellent trial of physical therapy.  I do not think at this time more physical therapy for this issue is going to help much.  Plan for injection as above.  I am hopeful this provide some benefit.  Right leg pain: Distribution of pain consistent with L4 radiculopathy.  Patient does not have much changes on x-rays obtained today at right total knee replacement per my interpretation however radiology overread is pending.  We will treat for radiculopathy.  Trial of small dose of gabapentin for use at bedtime as this may help some.  Additionally will proceed with lumbar spine MRI for epidural steroid injection planning.  Patient has severe degenerative changes seen on L-spine x-rays obtained somewhat recently.  He does point to his knee as a source of pain.  If pain not obvious after MRI and not better with epidural steroid injections would consider three-phase bone scan to evaluate for hardware loosening right total knee replacement.  Recheck following MRI.  Anticipate follow-up in a few weeks.  Total encounter time 60 minutes including charting time date of service. Time spent reviewing medical  records and discussing findings with family and patient as well as DDX, plan and options as well as backup plan, excluding time for injection.    Orders Placed This Encounter  Procedures  . DG FEMUR, MIN 2 VIEWS RIGHT    Standing Status:   Future    Number of Occurrences:   1    Standing Expiration Date:   09/28/2020    Order Specific Question:   Reason for Exam (SYMPTOM  OR DIAGNOSIS REQUIRED)    Answer:   eval leg pain    Order Specific Question:   Preferred imaging location?    Answer:   [facility]    Order Specific Question:   Radiology Contrast Protocol - do NOT remove file path    Answer:   \\[email or web address]\[email or web address]\[facility]\DXFluoroContrastProtocols.pdf  . DG Tibia/Fibula Right    Standing Status:   Future    Number of Occurrences:   1    Standing Expiration Date:   09/28/2020    Order Specific Question:   Reason for Exam (SYMPTOM  OR DIAGNOSIS REQUIRED)    Answer:   eval leg pain    Order Specific Question:   Preferred imaging location?    Answer:   [facility]    Order Specific Question:   Radiology Contrast Protocol - do NOT remove file path    Answer:   \\[email or web address]\[email or web address]\[facility]\DXFluoroContrastProtocols.pdf  .  MR Lumbar Spine Wo Contrast    Standing Status:   Future    Standing Expiration Date:   09/28/2020    Order Specific Question:   What is the patient's sedation requirement?    Answer:   No Sedation    Order Specific Question:   Does the patient have a pacemaker or implanted devices?    Answer:   No    Order Specific Question:   Preferred imaging location?    Answer:   [facility] (table limit-550lbs)    Order Specific Question:   Radiology Contrast Protocol - do NOT remove file path    Answer:   \\charchive\epicdata\[facility]\[email or web address].PDF   Meds ordered this encounter  Medications  . gabapentin (NEURONTIN) 100 MG capsule    Sig: Take 1-3 capsules (100-300 mg total) by mouth at bedtime as needed (nerve pain).    Dispense:  30 capsule     Refill:  3    Discussed warning signs or symptoms. Please see discharge instructions. Patient expresses understanding.   The above documentation has been reviewed and is accurate and complete [place] OP note: Op Note by Erline Levine, MD at 09/08/2011 6:22 PM  Author: Erline Levine, MD Service: Neurosurgery Author Type: Physician  Filed: 09/08/2011 6:22 PM Date of Service: 09/08/2011 6:22 PM Status: Signed  Editor: Erline Levine, MD (Physician)    09/08/2011  6:07 PM  PATIENT:  Steven [name]  86 y.o. male  PRE-OPERATIVE DIAGNOSIS:   Epidural mass, bilateral lower extremity weakness, spinal stenosis and spondylosis with myelopathy T12/L1  POST-OPERATIVE DIAGNOSIS:  Epidural mass, bilateral lower extremity weakness, spinal stenosis and spondylosis with myelopathy T12/L1  PROCEDURE:  Procedure(s) (LRB): Thoracic and LUMBAR LAMINECTOMY FOR TUMOR (Left) with microdissection  SURGEON:  Surgeon(s) and Role:    * Erline Levine, MD - Primary  PHYSICIAN ASSISTANT: Ronnald Ramp, MD  ASSISTANTS: none   ANESTHESIA:   general  EBL:  Total I/O In: 1950 [I.V.:1450; IV Piggyback:500] Out: [id number] [Urine:345; Blood:300]  BLOOD ADMINISTERED:none  DRAINS: (Medium) Hemovact drain(s) in the epidural space with  Suction Open   LOCAL MEDICATIONS USED:  LIDOCAINE   SPECIMEN:  Source of Specimen:  epidural mass  DISPOSITION OF SPECIMEN:  PATHOLOGY  COUNTS:  YES  TOURNIQUET:  * No tourniquets in log *  DICTATION: DICTATION: Patient has a largeepidural spinal mass at the T12/L1 level with cord compression and myelopathy. It was elected to take him to surgery for T12-L1 laminectomy and resection of epidural mass.  Procedure: Patient was brought to the operating room and following the smooth and uncomplicated induction of general endotracheal anesthesia he was placed in a prone position on the Wilson frame. Initial localizing Xray was obtained with needles taped  to the patient's back. Low to mid back was prepped and draped in the usual sterile fashion with DuraPrep. Area of planned incision was infiltrated with local lidocaine. Incision was made in the midline and carried to the thoracolumbar fascia which was incised bilaterally. Subperiosteal dissection was performed exposing what was felt to be T12 and L1 levels. Intraoperative x-rays demonstrated marker probes at L1 pedicle. A total laminectomy of L1 was performed a high-speed drill and completed with Kerrison rongeurs and a generous foraminotomy was performed overlying the superior aspect of the L2 lamina and inferior aspect of T12 laminae. Ligamentum flavum was detached and removed in a piecemeal fashion and the spinal cord dura was decompressed laterally with removal of the superior aspect of the  facet and ligamentum causing nerve root and cord compression. The spinal cord dura appeared infolded on itself and this improved after decompression was completed. The microscope was brought into the field and theL2 nerve root was mobilized medially. This exposed fibrocartilagenous material and a free fragment of herniated disc material. Multiple fragments were removed and fine ball-tipped probes were passed under the thecal sac and I was able to palpate additional material, which seemed consistent with herniated disc material.  At this point it was felt that all neural elements were well decompressed and there was no evidence of residual loose disc material within the epidural space.  I was concerned that further manipulation would put the patient's spinal cord at risk and that the cord had by now been sufficiently decompressed. The interspace was then irrigated with bacitracin saline and no additional disc material was mobilized. Hemostasis was assured with bipolar electrocautery , bone wax and gelfoam. A medium Hemovac drain was placed in the epidural space.The lumbodorsal fascia was closed with 0 Vicryl sutures the  subcutaneous tissues reapproximated 2-0 Vicryl inverted sutures and the skin edges were reapproximated with 3-0 Vicryl subcuticular stitch. The wound is dressed with Dermabond. Patient was extubated in the operating room and taken to recovery in stable and satisfactory condition having tolerated his operation well counts were correct at the end of the case.  PLAN OF CARE: Admit to inpatient   PATIENT DISPOSITION:  PACU - hemodynamically stable.   Delay start of Pharmacological VTE agent (>24hrs) due to surgical blood loss or risk of bleeding: yes    Patient Name: Steven [name], Steven [name] Accession #: [id number] DOB: [birth year] Age: 45 Gender: M Client Name Tillie Rung. [facility] Collected Date: 09/08/2011 Received Date: 09/09/2011 Physician: Erline Levine Chart #: MRN # : [id number] Physician cc: Race:W Visit #: [id number] REPORT OF SURGICAL PATHOLOGY FINAL DIAGNOSIS Diagnosis Soft tissue mass, simple excision, epidural ABUNDANT ELASTIC FIBERS, CARTILAGENOUS TISSUE WITH ASSOCIATED PROMINENT DEGENERATIVE CHANGES AND CALCIFICATIONS. PLEASE SEE COMMENT. Microscopic Comment There is no evidence of inflammation, atypia or malignancy. The main consideration in this setting includes synovial cyst. Clinical and radiographic correlation is highly recommended. Aldona Bar MD Pathologist, Electronic Signature (Case signed 09/13/2011) Specimen Gross and Clinical Information Specimen(s) Obtained: Soft tissue mass, simple excision, epidural Specimen Clinical

## 2019-07-31 NOTE — Progress Notes (Signed)
X-ray right lower leg shows normal-appearing hardware a replaced knee.  Soft tissue swelling present around ankle.

## 2019-07-31 NOTE — Progress Notes (Signed)
X-ray right femur shows normal-appearing replaced knee.  Femur and hip normal-appearing

## 2019-08-01 ENCOUNTER — Telehealth: Payer: Self-pay | Admitting: Family Medicine

## 2019-08-01 DIAGNOSIS — Z7984 Long term (current) use of oral hypoglycemic drugs: Secondary | ICD-10-CM | POA: Diagnosis not present

## 2019-08-01 DIAGNOSIS — I10 Essential (primary) hypertension: Secondary | ICD-10-CM | POA: Diagnosis not present

## 2019-08-01 DIAGNOSIS — M810 Age-related osteoporosis without current pathological fracture: Secondary | ICD-10-CM | POA: Diagnosis not present

## 2019-08-01 DIAGNOSIS — E119 Type 2 diabetes mellitus without complications: Secondary | ICD-10-CM | POA: Diagnosis not present

## 2019-08-01 DIAGNOSIS — L89323 Pressure ulcer of left buttock, stage 3: Secondary | ICD-10-CM | POA: Diagnosis not present

## 2019-08-01 DIAGNOSIS — R2681 Unsteadiness on feet: Secondary | ICD-10-CM | POA: Diagnosis not present

## 2019-08-01 DIAGNOSIS — I2510 Atherosclerotic heart disease of native coronary artery without angina pectoris: Secondary | ICD-10-CM | POA: Diagnosis not present

## 2019-08-01 DIAGNOSIS — Z951 Presence of aortocoronary bypass graft: Secondary | ICD-10-CM | POA: Diagnosis not present

## 2019-08-01 NOTE — Telephone Encounter (Signed)
See below messages

## 2019-08-01 NOTE — Telephone Encounter (Signed)
Erica from [facility] calling to get verbal orders for the patient for wound care if he can use desitin the wound is closed now but is still red. Danae Chen also wanted to see if they can go down to x1 a week for Home health since he doing better. Call back number [phone].

## 2019-08-01 NOTE — Telephone Encounter (Signed)
Patient's daughter is calling in asking if they can get an order for an x-ray for his neck, after his fall last week.

## 2019-08-01 NOTE — Telephone Encounter (Signed)
I am okay with verbal orders as below.  In regards to the fall-he has he did report a fall and he declined work-up at the time of his visit.  Primarily was having wrist pain at that time.  I was not aware of leg pain-you can schedule him a visit with one of my colleagues if any availability this afternoon or I am happy to see him next week if he would like to see me if the leg pain is significant enough.

## 2019-08-01 NOTE — Telephone Encounter (Signed)
Steven [name] has called back to add to this note.   States patient has knot on back of right head.  States that patient fell on the 5th.  Also, states that Dr. Yong Channel knew of this.  Patient is currently having pain in right legs.   Would like to know if Dr. Yong Channel would like patient to come in for an xray?

## 2019-08-01 NOTE — Telephone Encounter (Signed)
Returned [place] call and VO provided, she states she will have pt daughter call and schedule the appointment.

## 2019-08-04 ENCOUNTER — Other Ambulatory Visit: Payer: Self-pay

## 2019-08-04 NOTE — Telephone Encounter (Signed)
Please schedule OV  with Dr. Yong Channel and xray can be done at that time.

## 2019-08-04 NOTE — Telephone Encounter (Signed)
Patients daughter, Felipa Emory that she has to have a caregiver with her to help assist getting him in/out of car so needed afternoon appointment. Scheduled patient with Aldona Bar for 08/05/19

## 2019-08-05 ENCOUNTER — Ambulatory Visit: Payer: Medicare Other | Admitting: Family Medicine

## 2019-08-05 ENCOUNTER — Ambulatory Visit (INDEPENDENT_AMBULATORY_CARE_PROVIDER_SITE_OTHER): Payer: Medicare Other

## 2019-08-05 ENCOUNTER — Ambulatory Visit (INDEPENDENT_AMBULATORY_CARE_PROVIDER_SITE_OTHER): Payer: Medicare Other | Admitting: Family Medicine

## 2019-08-05 ENCOUNTER — Encounter: Payer: Self-pay | Admitting: Family Medicine

## 2019-08-05 ENCOUNTER — Other Ambulatory Visit (INDEPENDENT_AMBULATORY_CARE_PROVIDER_SITE_OTHER): Payer: Medicare Other

## 2019-08-05 VITALS — BP 160/80 | HR 83 | Temp 98.3°F | Ht 65.0 in | Wt 153.0 lb

## 2019-08-05 DIAGNOSIS — M542 Cervicalgia: Secondary | ICD-10-CM

## 2019-08-05 DIAGNOSIS — E119 Type 2 diabetes mellitus without complications: Secondary | ICD-10-CM

## 2019-08-05 MED ORDER — TRAMADOL HCL 50 MG PO TABS: 25.0000 mg | ORAL_TABLET | Freq: Four times a day (QID) | ORAL | 0 refills | Status: DC | PRN

## 2019-08-05 NOTE — Progress Notes (Deleted)
Steven [name] is a 86 y.o. male here for a new problem.  SCRIBE STATEMENT  History of Present Illness:   No chief complaint on file.   HPI  Past Medical History:  Diagnosis Date  . Abnormality of gait 08/23/2012  . Anemia    hx of  . Arthritis 01-03-12   osteoarthritis-knees  . CAD (coronary artery disease) 2004   s/p inferior wall Mi 2004 with PTCA/Stent; s/p CABG 2004  . Degenerative arthritis   . Diabetes mellitus    type II  . Foot drop, bilateral 08/23/2012  . GERD (gastroesophageal reflux disease)   . Heart disease   . History of kidney stones   . History of myocardial infarction   . Hyperlipidemia   . Myocardial infarction [facility]) 01-03-12   '04  . MYOCARDIAL INFARCTION, HX OF 04/19/2007   Qualifier: Diagnosis of  By: Scherrie Gerlach    . Neuropathy 01-03-12   bil. feet  . Pneumonia    hx of  . Polyneuropathy in diabetes(357.2) 08/23/2012  . Radiculopathy of lumbar region 08/23/2011   Followed by Dr. Vertell Limber. S/p surgery. Resolved after surgery.       Social History   Socioeconomic History  . Marital status: Married    Spouse name: Not on file  . Number of children: 2  . Years of education: College  . Highest education level: Not on file  Occupational History  . Occupation: Research scientist (life sciences)  Tobacco Use  . Smoking status: Never Smoker  . Smokeless tobacco: Never Used  Substance and Sexual Activity  . Alcohol use: No  . Drug use: No  . Sexual activity: Not Currently  Other Topics Concern  . Not on file  Social History Narrative   Married 1956.  2 children. No grandkids. Neither children married.    1 daughter lives at house and watches over parents.       Retired from [employer]. Worked for hisself afterwards.       Regular exercise: no   Daily Caffeine: 3 cups coffee daily   Social Determinants of Health   Financial Resource Strain:   . Difficulty of Paying Living Expenses:   Food Insecurity:   . Worried About Charity fundraiser in  the Last Year:   . Arboriculturist in the Last Year:   Transportation Needs:   . Film/video editor (Medical):   Marland Kitchen Lack of Transportation (Non-Medical):   Physical Activity:   . Days of Exercise per Week:   . Minutes of Exercise per Session:   Stress:   . Feeling of Stress :   Social Connections:   . Frequency of Communication with Friends and Family:   . Frequency of Social Gatherings with Friends and Family:   . Attends Religious Services:   . Active Member of Clubs or Organizations:   . Attends Archivist Meetings:   Marland Kitchen Marital Status:   Intimate Partner Violence:   . Fear of Current or Ex-Partner:   . Emotionally Abused:   Marland Kitchen Physically Abused:   . Sexually Abused:     Past Surgical History:  Procedure Laterality Date  . BACK SURGERY  2013  . CARDIAC CATHETERIZATION  01-03-12   '04  . CATARACT EXTRACTION Bilateral 2015  . COLONOSCOPY    . CORONARY ARTERY BYPASS GRAFT  2004   LIMA to LAD; SVG to ramus intermedius; SVG to PDA/PLSA  . LAMINECTOMY  09/08/2011   Procedure: LUMBAR LAMINECTOMY FOR TUMOR;  Surgeon: Erline Levine, MD;  Location: [place] [facility];  Service: Neurosurgery;  Laterality: Left;  Left Thoracic twelve-Lumbar one transpedicular resection of epidural mass  . TONSILLECTOMY  as child  . TOTAL KNEE ARTHROPLASTY  01/15/2012   Procedure: TOTAL KNEE ARTHROPLASTY;  Surgeon: Gearlean Alf, MD;  Location: [facility] ORS;  Service: Orthopedics;  Laterality: Right;  . TOTAL KNEE ARTHROPLASTY Left 09/16/2012   Procedure: LEFT TOTAL KNEE ARTHROPLASTY;  Surgeon: Gearlean Alf, MD;  Location: [facility] ORS;  Service: Orthopedics;  Laterality: Left;    Family History  Problem Relation Age of Onset  . Cirrhosis Mother        etiology unclear  . Hypertension Father   . Stroke Father   . Cancer Sister        breast  . Stroke Maternal Grandmother   . Heart disease Maternal Grandfather        MI  . Stroke Paternal Grandmother   . Stroke Paternal Grandfather   . Anesthesia  problems Neg Hx   . Hypotension Neg Hx   . Malignant hyperthermia Neg Hx   . Pseudochol deficiency Neg Hx     No Known Allergies  Current Medications:   Current Outpatient Medications:  .  alendronate (FOSAMAX) 70 MG tablet, TAKE 1 TABLET BY MOUTH EVERY 7 DAYS WITH A FULL GLASS OF WATER ON AN EMPTY STOMACH, Disp: 12 tablet, Rfl: 4 .  aspirin 81 MG tablet, Take 81 mg by mouth daily., Disp: , Rfl:  .  atorvastatin (LIPITOR) 80 MG tablet, TAKE 1 TABLET DAILY., Disp: 90 tablet, Rfl: 3 .  cycloSPORINE (RESTASIS) 0.05 % ophthalmic emulsion, 1 drop 2 (two) times daily., Disp: , Rfl:  .  diclofenac sodium (VOLTAREN) 1 % GEL, Apply 4 g topically 4 (four) times daily., Disp: 100 g, Rfl: 1 .  diltiazem (MATZIM LA) 180 MG 24 hr tablet, Take 1 tablet (180 mg total) by mouth daily., Disp: 90 tablet, Rfl: 1 .  gabapentin (NEURONTIN) 100 MG capsule, Take 1-3 capsules (100-300 mg total) by mouth at bedtime as needed (nerve pain)., Disp: 30 capsule, Rfl: 3 .  glimepiride (AMARYL) 1 MG tablet, Take 1 tablet (1 mg total) by mouth daily with breakfast., Disp: 90 tablet, Rfl: 3 .  glucose blood test strip, Use to test your blood sugar daily. E11.9 One touch Verio test strips, Disp: 100 each, Rfl: 12 .  hydrocortisone 2.5 % cream, APPLICATIONS APPLY ON THE SKIN EVERY DAY, Disp: , Rfl:  .  ketoconazole (NIZORAL) 2 % shampoo, APPLY 2 TIMES A WEEK, Disp: 120 mL, Rfl: 3 .  Lancets (ONETOUCH ULTRASOFT) lancets, USE TO TEST BLOOD SUGARS DAILY., Disp: 100 each, Rfl: 3 .  metFORMIN (GLUCOPHAGE) 1000 MG tablet, Take 1 tablet (1,000 mg total) by mouth 2 (two) times daily with a meal., Disp: 60 tablet, Rfl: 2 .  metoprolol succinate (TOPROL-XL) 50 MG 24 hr tablet, TAKE 1 TABLET BY MOUTH EVERY DAY, Disp: 90 tablet, Rfl: 1 .  omeprazole (PRILOSEC) 20 MG capsule, Take 1 capsule (20 mg total) by mouth daily. (Patient taking differently: Take 20 mg by mouth as needed. ), Disp: 90 capsule, Rfl: 3 .  ONETOUCH VERIO test strip, USE  TO TEST BLOOD SUGARS DAILY., Disp: 100 each, Rfl: 3   Review of Systems:   ROS  Vitals:   There were no vitals filed for this visit.   There is no height or weight on file to calculate BMI.  Physical Exam:   Physical Exam  Results for orders placed or performed in visit on 12/18/18  HM DIABETES EYE EXAM  Result Value Ref Range   HM Diabetic Eye Exam No Retinopathy No Retinopathy    Assessment and Plan:   There are no diagnoses linked to this encounter.  . Reviewed expectations re: course of current medical issues. . Discussed self-management of symptoms. . Outlined signs and symptoms indicating need for more acute intervention. . Patient verbalized understanding and all questions were answered. . See orders for this visit as documented in the electronic medical record. . Patient received an After-Visit Summary.  ***  Inda Coke, PA-C

## 2019-08-05 NOTE — Progress Notes (Signed)
   Steven [name] is a 86 y.o. male who presents today for an office visit.  Assessment/Plan:  New/Acute Problems: Neck [name] / Right Hip [name] No red flags. Will check plain film of neck given recent trauma. [name] like due to muscle spasm/contusion. Will avoid NSAIDs given cardiac history. Will avoid prednisone due to history of T2DM. Will start low-dose tramadol.  Discussed reasons to return to care.  He will need to follow-up with sports medicine as previously planned.    Subjective:  HPI:  Patient fell about a 2 weeks ago.  After his fall he was evaluated by his PCP as well as sports medicine.  Still having significant amount of right hip [name] radiating into his knee. Able to walk, but with [name]. He is also having some [name] in the back of his right neck.  No reported weakness or numbness.        Objective:  Physical Exam: Ht 5\' 5"  (1.651 m)   BMI 24.40 kg/m   Gen: No acute distress, resting comfortably MSK: - Neck: No deformities. Tender to palpitation along right paraspinal muscles FROM.  - UE: FROM, neurovascularly intact distally - LE: FROM, neurovascularly intact distally.       Steven [name]. Steven Pain, MD 08/05/2019 3:32 PM

## 2019-08-05 NOTE — Patient Instructions (Signed)
It was very nice to see you today!  We will start the tramadol.  Please take half a tablet in the morning and half a tablet in the evening  We will check an x-ray of your neck.  Please come back to see Dr. Yong Channel if your symptoms are not improving.  Take care, Dr Jerline Pain

## 2019-08-06 DIAGNOSIS — I10 Essential (primary) hypertension: Secondary | ICD-10-CM | POA: Diagnosis not present

## 2019-08-06 DIAGNOSIS — E119 Type 2 diabetes mellitus without complications: Secondary | ICD-10-CM | POA: Diagnosis not present

## 2019-08-06 DIAGNOSIS — M810 Age-related osteoporosis without current pathological fracture: Secondary | ICD-10-CM | POA: Diagnosis not present

## 2019-08-06 DIAGNOSIS — R2681 Unsteadiness on feet: Secondary | ICD-10-CM | POA: Diagnosis not present

## 2019-08-06 DIAGNOSIS — L89323 Pressure ulcer of left buttock, stage 3: Secondary | ICD-10-CM | POA: Diagnosis not present

## 2019-08-06 DIAGNOSIS — Z951 Presence of aortocoronary bypass graft: Secondary | ICD-10-CM | POA: Diagnosis not present

## 2019-08-06 DIAGNOSIS — I2510 Atherosclerotic heart disease of native coronary artery without angina pectoris: Secondary | ICD-10-CM | POA: Diagnosis not present

## 2019-08-06 DIAGNOSIS — Z7984 Long term (current) use of oral hypoglycemic drugs: Secondary | ICD-10-CM | POA: Diagnosis not present

## 2019-08-06 LAB — COMPREHENSIVE METABOLIC PANEL
ALT: 12 U/L (ref 0–53)
AST: 13 U/L (ref 0–37)
Albumin: 4.1 g/dL (ref 3.5–5.2)
Alkaline Phosphatase: 58 U/L (ref 39–117)
BUN: 23 mg/dL (ref 6–23)
CO2: 25 mEq/L (ref 19–32)
Calcium: 9.9 mg/dL (ref 8.4–10.5)
Chloride: 101 mEq/L (ref 96–112)
Creatinine, Ser: 1.16 mg/dL (ref 0.40–1.50)
GFR: 59.65 mL/min — ABNORMAL LOW (ref >60.00)
Glucose, Bld: 124 mg/dL — ABNORMAL HIGH (ref 70–99)
Potassium: 4.7 mEq/L (ref 3.5–5.1)
Sodium: 135 mEq/L (ref 135–145)
Total Bilirubin: 0.7 mg/dL (ref 0.2–1.2)
Total Protein: 6.7 g/dL (ref 6.0–8.3)

## 2019-08-06 LAB — LIPID PANEL
Cholesterol: 130 mg/dL (ref 0–200)
HDL: 41 mg/dL (ref >39.00)
LDL Cholesterol: 69 mg/dL (ref 0–99)
NonHDL: 89.19
Total CHOL/HDL Ratio: 3
Triglycerides: 101 mg/dL (ref 0.0–149.0)
VLDL: 20.2 mg/dL (ref 0.0–40.0)

## 2019-08-06 LAB — HEMOGLOBIN A1C: Hgb A1c MFr Bld: 6.5 % (ref 4.6–6.5)

## 2019-08-07 LAB — CBC WITH DIFFERENTIAL/PLATELET
Basophils Absolute: 0 10*3/uL (ref 0.0–0.1)
Basophils Relative: 0.3 % (ref 0.0–3.0)
Eosinophils Absolute: 0.1 10*3/uL (ref 0.0–0.7)
Eosinophils Relative: 0.7 % (ref 0.0–5.0)
HCT: 36.9 % — ABNORMAL LOW (ref 39.0–52.0)
Hemoglobin: 12.3 g/dL — ABNORMAL LOW (ref 13.0–17.0)
Lymphocytes Relative: 24.5 % (ref 12.0–46.0)
Lymphs Abs: 2.2 10*3/uL (ref 0.7–4.0)
MCHC: 33.2 g/dL (ref 30.0–36.0)
MCV: 94.9 fl (ref 78.0–100.0)
Monocytes Absolute: 0 10*3/uL — ABNORMAL LOW (ref 0.1–1.0)
Monocytes Relative: 0.4 % — ABNORMAL LOW (ref 3.0–12.0)
Neutro Abs: 6.8 10*3/uL (ref 1.4–7.7)
Neutrophils Relative %: 74.1 % (ref 43.0–77.0)
Platelets: 291 10*3/uL (ref 150.0–400.0)
RBC: 3.89 Mil/uL — ABNORMAL LOW (ref 4.22–5.81)
RDW: 14.5 % (ref 11.5–15.5)
WBC: 9.2 10*3/uL (ref 4.0–10.5)

## 2019-08-07 NOTE — Progress Notes (Signed)
Please inform patient of the following:  Xray shows arthritis but no fracture or any other abnormalities.  Algis Greenhouse. Jerline Pain, MD 08/07/2019 8:03 AM

## 2019-08-12 DIAGNOSIS — I2510 Atherosclerotic heart disease of native coronary artery without angina pectoris: Secondary | ICD-10-CM | POA: Diagnosis not present

## 2019-08-12 DIAGNOSIS — R2681 Unsteadiness on feet: Secondary | ICD-10-CM | POA: Diagnosis not present

## 2019-08-12 DIAGNOSIS — L89323 Pressure ulcer of left buttock, stage 3: Secondary | ICD-10-CM | POA: Diagnosis not present

## 2019-08-12 DIAGNOSIS — E119 Type 2 diabetes mellitus without complications: Secondary | ICD-10-CM | POA: Diagnosis not present

## 2019-08-12 DIAGNOSIS — M810 Age-related osteoporosis without current pathological fracture: Secondary | ICD-10-CM | POA: Diagnosis not present

## 2019-08-12 DIAGNOSIS — Z7984 Long term (current) use of oral hypoglycemic drugs: Secondary | ICD-10-CM | POA: Diagnosis not present

## 2019-08-12 DIAGNOSIS — Z951 Presence of aortocoronary bypass graft: Secondary | ICD-10-CM | POA: Diagnosis not present

## 2019-08-12 DIAGNOSIS — I10 Essential (primary) hypertension: Secondary | ICD-10-CM | POA: Diagnosis not present

## 2019-08-13 ENCOUNTER — Telehealth: Payer: Self-pay

## 2019-08-13 NOTE — Telephone Encounter (Signed)
Received a referral for Palliative care from [facility]. Message sent to Dr. Yong Channel to inquire if he is in agreement with this.

## 2019-08-14 ENCOUNTER — Telehealth: Payer: Self-pay | Admitting: Internal Medicine

## 2019-08-14 NOTE — Telephone Encounter (Signed)
Phone call placed to patient to offer to schedule a visit with [facility]. Phone rang, with no answer I left a voicemail for call back. 

## 2019-08-19 ENCOUNTER — Telehealth: Payer: Self-pay

## 2019-08-19 DIAGNOSIS — E119 Type 2 diabetes mellitus without complications: Secondary | ICD-10-CM | POA: Diagnosis not present

## 2019-08-19 DIAGNOSIS — L89323 Pressure ulcer of left buttock, stage 3: Secondary | ICD-10-CM | POA: Diagnosis not present

## 2019-08-19 DIAGNOSIS — Z951 Presence of aortocoronary bypass graft: Secondary | ICD-10-CM | POA: Diagnosis not present

## 2019-08-19 DIAGNOSIS — I10 Essential (primary) hypertension: Secondary | ICD-10-CM | POA: Diagnosis not present

## 2019-08-19 DIAGNOSIS — M810 Age-related osteoporosis without current pathological fracture: Secondary | ICD-10-CM | POA: Diagnosis not present

## 2019-08-19 DIAGNOSIS — I2510 Atherosclerotic heart disease of native coronary artery without angina pectoris: Secondary | ICD-10-CM | POA: Diagnosis not present

## 2019-08-19 DIAGNOSIS — R2681 Unsteadiness on feet: Secondary | ICD-10-CM | POA: Diagnosis not present

## 2019-08-19 DIAGNOSIS — Z7984 Long term (current) use of oral hypoglycemic drugs: Secondary | ICD-10-CM | POA: Diagnosis not present

## 2019-08-19 NOTE — Telephone Encounter (Signed)
Phone call placed to patient's home. Spoke with caregiver who took contact information and will have daughter, [name], return call.

## 2019-08-28 ENCOUNTER — Telehealth: Payer: Self-pay | Admitting: Internal Medicine

## 2019-08-28 NOTE — Telephone Encounter (Signed)
Spoke with patient's daughter Charistopher Shaut, regarding Palliative services and all questions were answered and she was in agreement with this.  I have scheduled an In-person Consult for 09/05/19 @ 2:30 PM.

## 2019-09-01 ENCOUNTER — Other Ambulatory Visit: Payer: Self-pay

## 2019-09-02 ENCOUNTER — Ambulatory Visit (INDEPENDENT_AMBULATORY_CARE_PROVIDER_SITE_OTHER): Payer: Medicare Other | Admitting: Family Medicine

## 2019-09-02 ENCOUNTER — Encounter: Payer: Self-pay | Admitting: Family Medicine

## 2019-09-02 VITALS — BP 132/62 | HR 67 | Temp 98.3°F | Ht 65.0 in

## 2019-09-02 DIAGNOSIS — M25551 Pain in right hip: Secondary | ICD-10-CM

## 2019-09-02 DIAGNOSIS — I714 Abdominal aortic aneurysm, without rupture, unspecified: Secondary | ICD-10-CM

## 2019-09-02 DIAGNOSIS — I10 Essential (primary) hypertension: Secondary | ICD-10-CM

## 2019-09-02 DIAGNOSIS — E119 Type 2 diabetes mellitus without complications: Secondary | ICD-10-CM

## 2019-09-02 MED ORDER — TRAMADOL HCL 50 MG PO TABS: 25.0000 mg | ORAL_TABLET | Freq: Four times a day (QID) | ORAL | 0 refills | Status: DC | PRN

## 2019-09-02 NOTE — Patient Instructions (Addendum)
Health Maintenance Due  Topic Date Due  . COVID-19 Vaccine - please get this updated and let us know Never done  . TETANUS/TDAP -d ue for this but focus on getting covid vaccine first 12/23/2018   Team-before patient leaves please give him the contact number to get aortic aneurysm recheck rescheduled  Refilled tramadol for hip pain- hopefully we can get that MRI soon  Recommended follow up: Return in about 3 months (around 12/03/2019) for follow up- or sooner if needed.

## 2019-09-02 NOTE — Progress Notes (Signed)
Phone [phone] In person visit   Subjective:   Steven [name] is a 86 y.o. year old very pleasant male patient who presents for/with See problem oriented charting Chief Complaint  Patient presents with  . Pain    He has been complaining of right sided hip, knee and ankle pain. Previous hip injection helped but patient believes it has worn off.   . Mobility    He has a caregiver thats helps him get around.    This visit occurred during the SARS-CoV-2 public health emergency.  Safety protocols were in place, including screening questions prior to the visit, additional usage of staff PPE, and extensive cleaning of exam room while observing appropriate contact time as indicated for disinfecting solutions.   Past Medical History-  Patient Active Problem List   Diagnosis Date Noted  . AAA (abdominal aortic aneurysm) ([place]) 12/13/2018    Priority: High  . Falls frequently 08/21/2017    Priority: High  . Aortic root dilatation ([place]) 10/06/2016    Priority: High  . Osteoporosis 01/07/2015    Priority: High  . CAD, ARTERY BYPASS GRAFT 05/21/2009    Priority: High  . Carotid artery stenosis 04/20/2008    Priority: High  . Diabetes mellitus type II, controlled ([place]) 04/19/2007    Priority: High  . Anemia 09/03/2013    Priority: Medium  . Essential hypertension, benign 10/11/2012    Priority: Medium  . Hyperlipidemia 04/19/2007    Priority: Medium  . Seborrheic dermatitis 09/13/2017    Priority: Low  . Senile purpura ([place]) 08/21/2017    Priority: Low  . History of acute inferior wall MI 09/03/2015    Priority: Low  . Foot drop, left 08/23/2012    Priority: Low  . History of supraventricular tachycardia 01/25/2011    Priority: Low  . Hypogonadism in male 02/04/2010    Priority: Low  . History of colonic polyps 09/07/2009    Priority: Low  . GERD 04/19/2007    Priority: Low  . Hx of CABG 12/13/2017  . PVC's (premature ventricular contractions) 12/13/2017     Medications- reviewed and updated Current Outpatient Medications  Medication Sig Dispense Refill  . alendronate (FOSAMAX) 70 MG tablet TAKE 1 TABLET BY MOUTH EVERY 7 DAYS WITH A FULL GLASS OF WATER ON AN EMPTY STOMACH 12 tablet 4  . aspirin 81 MG tablet Take 81 mg by mouth daily.    Marland Kitchen atorvastatin (LIPITOR) 80 MG tablet TAKE 1 TABLET DAILY. 90 tablet 3  . cycloSPORINE (RESTASIS) 0.05 % ophthalmic emulsion 1 drop 2 (two) times daily.    . diclofenac sodium (VOLTAREN) 1 % GEL Apply 4 g topically 4 (four) times daily. (Patient taking differently: Apply 4 g topically in the morning and at bedtime. ) 100 g 1  . diltiazem (MATZIM LA) 180 MG 24 hr tablet Take 1 tablet (180 mg total) by mouth daily. 90 tablet 1  . glimepiride (AMARYL) 1 MG tablet Take 1 tablet (1 mg total) by mouth daily with breakfast. 90 tablet 3  . glucose blood test strip Use to test your blood sugar daily. E11.9 One touch Verio test strips 100 each 12  . hydrocortisone 2.5 % cream APPLICATIONS APPLY ON THE SKIN EVERY DAY    . Lancets (ONETOUCH ULTRASOFT) lancets USE TO TEST BLOOD SUGARS DAILY. 100 each 3  . metFORMIN (GLUCOPHAGE) 1000 MG tablet Take 1 tablet (1,000 mg total) by mouth 2 (two) times daily with a meal. 60 tablet 2  . metoprolol succinate (  TOPROL-XL) 50 MG 24 hr tablet TAKE 1 TABLET BY MOUTH EVERY DAY 90 tablet 1  . omeprazole (PRILOSEC) 20 MG capsule Take 1 capsule (20 mg total) by mouth daily. (Patient taking differently: Take 20 mg by mouth as needed. ) 90 capsule 3  . ONETOUCH VERIO test strip USE TO TEST BLOOD SUGARS DAILY. 100 each 3  . traMADol (ULTRAM) 50 MG tablet Take 0.5 tablets (25 mg total) by mouth every 6 (six) hours as needed. 30 tablet 0  . ketoconazole (NIZORAL) 2 % shampoo APPLY 2 TIMES A WEEK (Patient not taking: Reported on 09/02/2019) 120 mL 3   No current facility-administered medications for this visit.     Objective:  BP 132/62   Pulse 67   Temp 98.3 F (36.8 C) (Temporal)   Ht 5'  5" (1.651 m)   SpO2 97%   BMI 25.46 kg/m  Gen: NAD, resting comfortably CV: RRR  Lungs: CTAB no crackles, wheeze, rhonchi Ext: 1+ edema Skin: warm, dry    Assessment and Plan   # Right hip/leg pain- potential L4 radiculopathy S:wakes up in the morning and has severe right hip pain that goes all the way down to his knee and then down to his ankle.  Has to hold onto bedpost to get out of bed- uses walker to get to next destination  MRI was ordered through Dr. [name] but cannot get that done until June- hope is that possible epidural injection may help. Hip injection helped for only short period for trochanteric bursitis. He did not think PT would be particularly helpful.   Gabapentin didn't seem to help much and seemed to make him less steady per daughter. Tramadol does help some with pain.  A/P: 86 year old man with potential radiculopathy-waiting on MRI.  Sports medicine did not think physical therapy would likely be helpful at this time.  Tramadol has been helpful for pain and we refilled this.  Gabapentin seem to cause some unsteadiness-we will remove this from his medication list.  The  # Diabetes- controlled on Amaryl 1mg , metformin 1000 mg twice a day. Continue current meds. Have space for the epidural injections if needed form MRI Lab Results  Component Value Date   HGBA1C 6.5 08/05/2019   #Abdominal aortic aneurysm (AAA) without rupture [facility]) S: August 2020 showed distal aortic aneurysm 4.4 cm.   Found incidentally during other work-up A/P:  Ordered 6 month repeat due to size of aneurysm- we will try to get the contact # for rescheduling- needs to get this done   #Essential hypertension, benign S: Compliant with metoprolol 50 mg extended release, diltiazem 180 mg extended release A/P: Stable. Continue current medications.    Recommended follow up: Return in about 3 months (around 12/03/2019) for follow up- or sooner if needed. Future Appointments  Date Time Provider  [place]  09/05/2019  2:30 PM Julianne Handler, NP ACP-ACP None  09/26/2019  4:40 PM GI-315 MR 1 GI-315MRI GI-315 [address] [facility]  09/29/2019  4:00 PM MC-CV NL VASC 3 MC-SECVI [facility]NL   Lab/Order associations:   ICD-10-CM   1. Right hip pain  M25.551   2. Essential hypertension, benign  I10   3. Abdominal aortic aneurysm (AAA) without rupture (HCC)  I71.4   4. Controlled type 2 diabetes mellitus without complication, without long-term current use of insulin (HCC)  E11.9     Meds ordered this encounter  Medications  . traMADol (ULTRAM) 50 MG tablet    Sig: Take 0.5 tablets (25 mg  total) by mouth every 6 (six) hours as needed.    Dispense:  30 tablet    Refill:  0    Return precautions advised.  [name], MD

## 2019-09-05 ENCOUNTER — Other Ambulatory Visit: Payer: Medicare Other | Admitting: Internal Medicine

## 2019-09-05 ENCOUNTER — Other Ambulatory Visit: Payer: Self-pay

## 2019-09-05 DIAGNOSIS — Z515 Encounter for palliative care: Secondary | ICD-10-CM

## 2019-09-05 DIAGNOSIS — Z7189 Other specified counseling: Secondary | ICD-10-CM

## 2019-09-05 NOTE — Progress Notes (Signed)
May 21st, 2021 [facility] Consult Note Telephone: [phone]  Fax: [phone]  PATIENT NAME: Steven [name] DOB: [birth year] MRN: [id number]  PRIMARY CARE PROVIDER:   Marin Olp, MD  REFERRING PROVIDER:  Marin Olp, MD [place],  [place] [address]  RESPONSIBLE PARTY:     ASSESSMENT / RECOMMENDATIONS:  1. Advance Care Planning: A. Directives: Discussed use of cardiopulmonary resuscitation in the event of an arrest. Patient states he would wish full efforts to be made but would defer to his daughter Steven [name] to make that final decision for him. Steven [name] supports full code at this time. B. Goals of Care: Has satisfaction in being alive and in fairly good health.  Daughter Steven [name] plans to maintain patient in his home with supports as necessary.  2. Cognitive / Functional status, Symptom Management:   Patient is alert and oriented; very pleasantly conversant. Poor short term memory.   Independent in transfers and ambulation (walker). Stand by assist for toileting and assist with showers / dressing. Denies dyspnea.   Good appetite; no signs of aspiration. No recent weight loss. (5/18) Height 5\' 5" , BMI 25.46 kg/m. Normal bowel movements/urination. Continent B/B. No nocturia. Has bed side urinal if needed.   Pain R hip/knee/ankle with worse when first getting up for the day; seems to improve as ambulating about. Improvement with Tramadol 50mg  bid. Eases off significantly when sedentary. Work up in progress; awaiting scheduled MRI  -Take 1st Tramadol 30 min before getting up in am, along with 500mg  Tylenol. Subsequent Tylenol 2 tabs 500mg  q4h, then Tramadol 50mg  with one Tylenol 500mg  at hs (total Tylenol dosage 3 grams).  -PT/OT consult: Strength building / recommendations for grip bars/other bathroom aides.  3. Family Supports: Daughter Steven [name] lives in the home; provides care for her mother who suffers from dementia, and  for patient. Private pay aide Lucianne Lei [phone]) works 7 hours/day.M-F. Son Steven [name] lives 10 min away but not involved in care.    4. Follow up Palliative Care Visit: Daughter Steven [name] will call on a prn basis. Requests I contact PCP to ask for patient PT/OT referral.  I spent 60 minutes providing this consultation from 3:30pm to 4:30pm. More than 50% of the time in this consultation was spent coordinating communication.   HISTORY OF PRESENT ILLNESS:  Steven [name] is an 86 y.o. male with h/o anemia, arthritis, trochanteric bursitis, radiculopathy R LE, CAD (MI / DABG 2004), DM type 2, GERD, HLD, AAA (4.4 cm/incidental finding), HTN. Palliative Care was asked to help address goals of care.   CODE STATUS: Full Code  PPS: 60%  HOSPICE ELIGIBILITY/DIAGNOSIS: TBD PAST MEDICAL HISTORY:  Past Medical History:  Diagnosis Date  . Abnormality of gait 08/23/2012  . Anemia    hx of  . Arthritis 01-03-12   osteoarthritis-knees  . CAD (coronary artery disease) 2004   s/p inferior wall Mi 2004 with PTCA/Stent; s/p CABG 2004  . Degenerative arthritis   . Diabetes mellitus    type II  . Foot drop, bilateral 08/23/2012  . GERD (gastroesophageal reflux disease)   . Heart disease   . History of kidney stones   . History of myocardial infarction   . Hyperlipidemia   . Myocardial infarction [facility]) 01-03-12   '04  . MYOCARDIAL INFARCTION, HX OF 04/19/2007   Qualifier: Diagnosis of  By: Scherrie Gerlach    . Neuropathy 01-03-12   bil. feet  . Pneumonia  hx of  . Polyneuropathy in diabetes(357.2) 08/23/2012  . Radiculopathy of lumbar region 08/23/2011   Followed by Dr. Vertell Limber. S/p surgery. Resolved after surgery.      SOCIAL HX:  Social History   Tobacco Use  . Smoking status: Never Smoker  . Smokeless tobacco: Never Used  Substance Use Topics  . Alcohol use: No    ALLERGIES: No Known Allergies   PERTINENT MEDICATIONS:  Outpatient Encounter Medications as of 09/05/2019  Medication Sig  .  acetaminophen (TYLENOL) 500 MG tablet Take 500 mg by mouth every 6 (six) hours as needed. Up to 6 tabs (3gm) qd  . alendronate (FOSAMAX) 70 MG tablet TAKE 1 TABLET BY MOUTH EVERY 7 DAYS WITH A FULL GLASS OF WATER ON AN EMPTY STOMACH  . aspirin 81 MG tablet Take 81 mg by mouth daily.  Marland Kitchen atorvastatin (LIPITOR) 80 MG tablet TAKE 1 TABLET DAILY.  . cycloSPORINE (RESTASIS) 0.05 % ophthalmic emulsion 1 drop 2 (two) times daily.  . diclofenac sodium (VOLTAREN) 1 % GEL Apply 4 g topically 4 (four) times daily. (Patient taking differently: Apply 4 g topically in the morning and at bedtime. )  . diltiazem (MATZIM LA) 180 MG 24 hr tablet Take 1 tablet (180 mg total) by mouth daily.  Marland Kitchen glimepiride (AMARYL) 1 MG tablet Take 1 tablet (1 mg total) by mouth daily with breakfast.  . glucose blood test strip Use to test your blood sugar daily. E11.9 One touch Verio test strips  . hydrocortisone 2.5 % cream APPLICATIONS APPLY ON THE SKIN EVERY DAY  . ketoconazole (NIZORAL) 2 % shampoo APPLY 2 TIMES A WEEK (Patient not taking: Reported on 09/02/2019)  . Lancets (ONETOUCH ULTRASOFT) lancets USE TO TEST BLOOD SUGARS DAILY.  . metFORMIN (GLUCOPHAGE) 1000 MG tablet Take 1 tablet (1,000 mg total) by mouth 2 (two) times daily with a meal.  . metoprolol succinate (TOPROL-XL) 50 MG 24 hr tablet TAKE 1 TABLET BY MOUTH EVERY DAY  . omeprazole (PRILOSEC) 20 MG capsule Take 1 capsule (20 mg total) by mouth daily. (Patient taking differently: Take 20 mg by mouth as needed. )  . ONETOUCH VERIO test strip USE TO TEST BLOOD SUGARS DAILY.  . traMADol (ULTRAM) 50 MG tablet Take 0.5 tablets (25 mg total) by mouth every 6 (six) hours as needed.  . [DISCONTINUED] testosterone cypionate (DEPO-TESTOSTERONE) 200 MG/ML injection Inject 1 mL (200 mg total) into the muscle every 30 (thirty) days.   No facility-administered encounter medications on file as of 09/05/2019.  l  Julianne Handler, NP

## 2019-09-06 ENCOUNTER — Encounter: Payer: Self-pay | Admitting: Internal Medicine

## 2019-09-10 ENCOUNTER — Other Ambulatory Visit: Payer: Self-pay

## 2019-09-10 DIAGNOSIS — R531 Weakness: Secondary | ICD-10-CM

## 2019-09-10 DIAGNOSIS — R296 Repeated falls: Secondary | ICD-10-CM

## 2019-09-10 DIAGNOSIS — M25551 Pain in right hip: Secondary | ICD-10-CM

## 2019-09-10 NOTE — Progress Notes (Signed)
I filled this out/submitted it

## 2019-09-12 ENCOUNTER — Other Ambulatory Visit: Payer: Self-pay

## 2019-09-12 ENCOUNTER — Ambulatory Visit (HOSPITAL_COMMUNITY)
Admission: RE | Admit: 2019-09-12 | Discharge: 2019-09-12 | Disposition: A | Discharge status: Home / Self Care | Payer: Medicare Other | Source: Ambulatory Visit | Attending: Family Medicine | Admitting: Family Medicine

## 2019-09-12 DIAGNOSIS — I714 Abdominal aortic aneurysm, without rupture, unspecified: Secondary | ICD-10-CM

## 2019-09-15 DIAGNOSIS — R531 Weakness: Secondary | ICD-10-CM | POA: Diagnosis not present

## 2019-09-15 DIAGNOSIS — N200 Calculus of kidney: Secondary | ICD-10-CM | POA: Diagnosis not present

## 2019-09-15 DIAGNOSIS — R1084 Generalized abdominal pain: Secondary | ICD-10-CM | POA: Diagnosis not present

## 2019-09-15 DIAGNOSIS — R000 Tachycardia, unspecified: Secondary | ICD-10-CM | POA: Diagnosis not present

## 2019-09-15 DIAGNOSIS — R52 Pain, unspecified: Secondary | ICD-10-CM | POA: Diagnosis not present

## 2019-09-15 DIAGNOSIS — I492 Junctional premature depolarization: Secondary | ICD-10-CM | POA: Diagnosis not present

## 2019-09-15 DIAGNOSIS — R339 Retention of urine, unspecified: Secondary | ICD-10-CM | POA: Diagnosis not present

## 2019-09-15 DIAGNOSIS — I10 Essential (primary) hypertension: Secondary | ICD-10-CM | POA: Diagnosis not present

## 2019-09-15 DIAGNOSIS — I700 Atherosclerosis of aorta: Secondary | ICD-10-CM | POA: Diagnosis not present

## 2019-09-15 DIAGNOSIS — I493 Ventricular premature depolarization: Secondary | ICD-10-CM | POA: Diagnosis not present

## 2019-09-15 DIAGNOSIS — J90 Pleural effusion, not elsewhere classified: Secondary | ICD-10-CM | POA: Diagnosis not present

## 2019-09-15 DIAGNOSIS — I714 Abdominal aortic aneurysm, without rupture: Secondary | ICD-10-CM | POA: Diagnosis not present

## 2019-09-15 DIAGNOSIS — K8020 Calculus of gallbladder without cholecystitis without obstruction: Secondary | ICD-10-CM | POA: Diagnosis not present

## 2019-09-16 ENCOUNTER — Telehealth: Payer: Self-pay | Admitting: Family Medicine

## 2019-09-16 NOTE — Telephone Encounter (Signed)
Yes, Please schedule virtual visit to discuss further.

## 2019-09-16 NOTE — Telephone Encounter (Signed)
Patient fell over the weekend and can not walk.  Daughter called EMS out for evaluation.    Patient got worse yesterday.    Daughter states she called the after hours line and was advised by [facility] to call 911 to take patient to the ED.    Patient was taken to [facility].    Daughter states that she believes patient did not received full care while there.  States patient was prescribed phosphorus for a possible UTI.    States patient is currently bedridden.   Would like to know what she needs to do from here.    Patient can not come into the office.    Daughter could however schedule a virtual visit through text with Dr. Yong Channel.  Please advise.

## 2019-09-16 NOTE — Telephone Encounter (Signed)
Nurse Assessment Nurse: Sumner Boast, RN, [place] Derry Date/Time Eilene Ghazi Time): 09/15/2019 10:47:42 AM Confirm and document reason for call. If symptomatic, describe symptoms. ---Caller states that her father is unable to walk at this time when he normally walks with a walker. States he has also had diarrhea X 1 and has a constant sensation to urinate. Symptoms started 2 days ago. He fell 2 days ago and paramedics came out but did not take him to the hospital. No fever. He is also confused. Has the patient had close contact with a person known or suspected to have the novel coronavirus illness OR traveled / lives in area with major community spread (including international travel) in the last 14 days from the onset of symptoms? * If Asymptomatic, screen for exposure and travel within the last 14 days. ---No Does the patient have any new or worsening symptoms? ---Yes Will a triage be completed? ---Yes Related visit to physician within the last 2 weeks? ---Yes Does the PT have any chronic conditions? (i.e. diabetes, asthma, this includes High risk factors for pregnancy, etc.) ---Yes List chronic conditions. ---MI 15 yrs ago Diabetic, Type II Hypertension High Cholesterol Is this a behavioral health or substance abuse call? ---NoPLEASE NOTE: All timestamps contained within this report are represented as [place] Standard Time. CONFIDENTIALTY NOTICE: This fax transmission is intended only for the addressee. It contains information that is legally privileged, confidential or otherwise protected from use or disclosure. If you are not the intended recipient, you are strictly prohibited from reviewing, disclosing, copying using or disseminating any of this information or taking any action in reliance on or regarding this information. If you have received this fax in error, please notify us immediately by telephone so that we can arrange for its return to [place]. Phone: [phone], Toll-Free: [phone], Fax:  [phone] Page: 2 of 2 Call Id: [id number] Guidelines Guideline Title Affirmed Question Affirmed Notes Nurse Date/Time Eilene Ghazi Time) Confusion - Delirium [1] Difficult to awaken or acting confused (e.g., disoriented, slurred speech) AND [2] present now AND [3] diabetes mellitus Sumner Boast, RNEnid Derry 09/15/2019 10:54:18 AM Disp. Time Eilene Ghazi Time) Disposition Final User 09/15/2019 11:09:09 AM 911 Outcome Documentation Sumner Boast, RN, [place] Derry Reason: Called back after 5 min and daughter stated paramedics are on the way. 09/15/2019 10:56:41 AM Call EMS 911 Now Yes Sumner Boast, RN, Teola Bradley Disagree/Comply Comply Caller Understands Yes PreDisposition Go to ED Care Advice Given Per Guideline CALL EMS 911 NOW: CARE ADVICE given per Confusion-Delirium (Adult) guideline. Referrals [facility] - ED

## 2019-09-16 NOTE — Telephone Encounter (Signed)
Patient is scheduled for 6/3 at 11am - My chart visit.

## 2019-09-18 ENCOUNTER — Telehealth (INDEPENDENT_AMBULATORY_CARE_PROVIDER_SITE_OTHER): Payer: Medicare Other | Admitting: Family Medicine

## 2019-09-18 ENCOUNTER — Encounter: Payer: Self-pay | Admitting: Family Medicine

## 2019-09-18 VITALS — Ht 65.0 in | Wt 153.0 lb

## 2019-09-18 DIAGNOSIS — I714 Abdominal aortic aneurysm, without rupture, unspecified: Secondary | ICD-10-CM

## 2019-09-18 DIAGNOSIS — R197 Diarrhea, unspecified: Secondary | ICD-10-CM

## 2019-09-18 DIAGNOSIS — N400 Enlarged prostate without lower urinary tract symptoms: Secondary | ICD-10-CM | POA: Insufficient documentation

## 2019-09-18 DIAGNOSIS — N401 Enlarged prostate with lower urinary tract symptoms: Secondary | ICD-10-CM | POA: Diagnosis not present

## 2019-09-18 MED ORDER — TRAMADOL HCL 50 MG PO TABS: 50.0000 mg | ORAL_TABLET | Freq: Three times a day (TID) | ORAL | 1 refills | Status: DC | PRN

## 2019-09-18 NOTE — Assessment & Plan Note (Signed)
S: August 2020 showed distal aortic aneurysm 4.4 cm.  Stable May 2021.  Yearly follow-up needed.  Found incidentally during other work-up A/P: ED imagingshowed aneurysm down to 3.5 cm-I suspect ultrasound to ultrasound is more helpful comparison so stability from August 2020 to May 2021 is reassuring-we will plan to recheck in 1 year

## 2019-09-18 NOTE — Assessment & Plan Note (Signed)
Urinary retention [facility] emergency room -started on Flomax

## 2019-09-18 NOTE — Patient Instructions (Addendum)
Health Maintenance Due  Topic Date Due  . COVID-19 Vaccine (1) Never done  . FOOT EXAM  03/29/2018  . TETANUS/TDAP  12/23/2018    Recommended follow up: No follow-ups on file.

## 2019-09-18 NOTE — Progress Notes (Signed)
Phone [phone] Virtual visit via Video note   Subjective:  Chief complaint: Chief Complaint  Patient presents with  . virtual  . ED f/u   This visit type was conducted due to national recommendations for restrictions regarding the COVID-19 Pandemic (e.g. social distancing).  This format is felt to be most appropriate for this patient at this time balancing risks to patient and risks to population by having him in for in person visit.  No physical exam was performed (except for noted visual exam or audio findings with Telehealth visits).    Our team/I connected with Steven [name] at 11:00 AM EDT by a video enabled telemedicine application ([email or web address] or caregility through epic) and verified that I am speaking with the correct person using two identifiers.  Location patient: [facility] Location provider: [facility], office Persons participating in the virtual visit:  Patient, daughter  Our team/I discussed the limitations of evaluation and management by telemedicine and the availability of in person appointments. In light of current covid-19 pandemic, patient also understands that we are trying to protect them by minimizing in office contact if at all possible.  The patient expressed consent for telemedicine visit and agreed to proceed. Patient understands insurance will be billed.   Past Medical History-  Patient Active Problem List   Diagnosis Date Noted  . AAA (abdominal aortic aneurysm) ([place]) 12/13/2018    Priority: High  . Falls frequently 08/21/2017    Priority: High  . Aortic root dilatation ([place]) 10/06/2016    Priority: High  . Osteoporosis 01/07/2015    Priority: High  . CAD, ARTERY BYPASS GRAFT 05/21/2009    Priority: High  . Carotid artery stenosis 04/20/2008    Priority: High  . Diabetes mellitus type II, controlled ([place]) 04/19/2007    Priority: High  . Anemia 09/03/2013    Priority: Medium  . Essential hypertension, benign 10/11/2012    Priority: Medium  .  Hyperlipidemia 04/19/2007    Priority: Medium  . Seborrheic dermatitis 09/13/2017    Priority: Low  . Senile purpura ([place]) 08/21/2017    Priority: Low  . History of acute inferior wall MI 09/03/2015    Priority: Low  . Foot drop, left 08/23/2012    Priority: Low  . History of supraventricular tachycardia 01/25/2011    Priority: Low  . Hypogonadism in male 02/04/2010    Priority: Low  . History of colonic polyps 09/07/2009    Priority: Low  . GERD 04/19/2007    Priority: Low  . Hx of CABG 12/13/2017  . PVC's (premature ventricular contractions) 12/13/2017    Medications- reviewed and updated Current Outpatient Medications  Medication Sig Dispense Refill  . acetaminophen (TYLENOL) 500 MG tablet Take 500 mg by mouth every 6 (six) hours as needed. Up to 6 tabs (3gm) qd    . alendronate (FOSAMAX) 70 MG tablet TAKE 1 TABLET BY MOUTH EVERY 7 DAYS WITH A FULL GLASS OF WATER ON AN EMPTY STOMACH 12 tablet 4  . aspirin 81 MG tablet Take 81 mg by mouth daily.    Marland Kitchen atorvastatin (LIPITOR) 80 MG tablet TAKE 1 TABLET DAILY. 90 tablet 3  . cycloSPORINE (RESTASIS) 0.05 % ophthalmic emulsion 1 drop 2 (two) times daily.    . diclofenac sodium (VOLTAREN) 1 % GEL Apply 4 g topically 4 (four) times daily. (Patient taking differently: Apply 4 g topically in the morning and at bedtime. ) 100 g 1  . diltiazem (MATZIM LA) 180 MG 24 hr tablet Take 1 tablet (  180 mg total) by mouth daily. 90 tablet 1  . glimepiride (AMARYL) 1 MG tablet Take 1 tablet (1 mg total) by mouth daily with breakfast. 90 tablet 3  . glucose blood test strip Use to test your blood sugar daily. E11.9 One touch Verio test strips 100 each 12  . hydrocortisone 2.5 % cream APPLICATIONS APPLY ON THE SKIN EVERY DAY    . Lancets (ONETOUCH ULTRASOFT) lancets USE TO TEST BLOOD SUGARS DAILY. 100 each 3  . metFORMIN (GLUCOPHAGE) 1000 MG tablet Take 1 tablet (1,000 mg total) by mouth 2 (two) times daily with a meal. 60 tablet 2  . metoprolol  succinate (TOPROL-XL) 50 MG 24 hr tablet TAKE 1 TABLET BY MOUTH EVERY DAY 90 tablet 1  . omeprazole (PRILOSEC) 20 MG capsule Take 1 capsule (20 mg total) by mouth daily. (Patient taking differently: Take 20 mg by mouth as needed. ) 90 capsule 3  . ONETOUCH VERIO test strip USE TO TEST BLOOD SUGARS DAILY. 100 each 3  . traMADol (ULTRAM) 50 MG tablet Take 1 tablet (50 mg total) by mouth every 8 (eight) hours as needed. 60 tablet 1  . ketoconazole (NIZORAL) 2 % shampoo APPLY 2 TIMES A WEEK (Patient not taking: Reported on 09/02/2019) 120 mL 3   No current facility-administered medications for this visit.     Objective:  Ht 5\' 5"  (1.651 m)   Wt 153 lb (69.4 kg)   BMI 25.46 kg/m  self reported vitals Gen: NAD, laying in his bed but does chat with me. Daughter also present for visit Lungs: nonlabored, normal respiratory rate  Skin: appears dry, no obvious rash     Assessment and Plan    Abdominal aortic aneurysm (AAA) without rupture [facility]) S: August 2020 showed distal aortic aneurysm 4.4 cm.  Stable May 2021.  Yearly follow-up needed.  Found incidentally during other work-up A/P: ED imagingshowed aneurysm down to 3.5 cm-I suspect ultrasound to ultrasound is more helpful comparison so stability from August 2020 to May 2021 is reassuring-we will plan to recheck in 1 year  # Ed follow up-left lower quadrant pain and since then has developed diarrhea S: Seen on 09/15/2019 for abdominal pain at [facility] emergency room.  Emergency department note was reviewed-patient had complained for 2 to 3 days left lower quadrant pain along with some loose stools.  CT scan was completed as below without any acute findings.  He was started on Flomax for urinary retention.  Family reports they were told to follow-up with PCP for discussion of possible surgery for gallstones-with that being said patient's pain was in left lower quadrant not in right upper quadrant  Patient states since leaving the emergency  room-Feels like he has to urinate constantly. Loose stools and wearing depends- 3-4 episodes a day since fall on saturday. Unable to walk. Lower appetite- eating smaller amounts of food.  Fortunately weight stable since April 20 visit.  He is also having continued Low back, hip pain- tramadol helpful with full tablet  From [facility] ed CT abd pelvis 09/15/19  "IMPRESSION: 1. No acute abnormality within the abdomen or pelvis. 2. Very small RIGHT pleural effusion and trace LEFT pleural effusion. 3. Stress/insufficiency fracture of the lateral RIGHT sacrum of uncertain chronicity but appears subacute to remote. 4. 3.5 cm abdominal aortic aneurysm. Recommend followup by ultrasound in 2 years. This recommendation follows ACR consensus guidelines: White Paper of the ACR Incidental Findings Committee II on Vascular Findings. J Am Coll Radiol 2013; 10:789-794.  Aortic aneurysm NOS (ICD10-I71.9) 5. Cholelithiasis without definite CT evidence of acute cholecystitis. 6. 7 mm nonobstructing RIGHT LOWER pole renal calculus. 7. Prostate enlargement. 8. Aortic Atherosclerosis (ICD10-I70.0). " A/P: Patient presented to the emergency room with left lower quadrant pain-at this point seems to have more issues with diarrhea and feeling weak overall as a result-we are going to get stool culture, C. difficile testing, fecal lactoferrin-consider treatment with antibiotic if continued issues and suggestion of bacterial infection-would need to be cautious with known aortic aneurysm -I do want him to hold metformin while having diarrhea and restart once resolves -Daughter had stopped his Slow Fe but I discussed he could restart that as could perhaps help with shift from diarrhea to her stools -Does have left kidney stone but was nonobstructive so doubt that is the cause of pain  Patient also is having difficulty getting out of bed since this illness -recommended bedside commode. -Recommended restarting physical  therapy even if just for upper body.  Dr. [name] was not confident would be helpful for lower body but we want to keep upper body strength intact so he can help with transfers at home  -perception of needing to frequently urinate may be related to underlying BPH.  Urinalysis without obvious UTI.  We will need to be cautious about orthostatic risk as he gets more active again-physical therapy being involved make sense once again here  Recommended follow up: 1-2 weeks if not improving Future Appointments  Date Time Provider [place]  09/26/2019  4:40 PM GI-315 MR 1 GI-315MRI GI-[facility] [address] [facility]  09/29/2019  4:00 PM [facility]-CV NL VASC 3 [facility]SE[facility]I [facility]  12/08/2019  2:40 PM Marin Olp, MD [facility]    Lab/Order associations:   ICD-10-CM   1. Diarrhea, unspecified type  R19.7 C. Difficile Toxin    Stool culture    Fecal lactoferrin, quant    Fecal lactoferrin, quant    Stool culture  2. Abdominal aortic aneurysm (AAA) without rupture (HCC)  I71.4     Meds ordered this encounter  Medications  . traMADol (ULTRAM) 50 MG tablet    Sig: Take 1 tablet (50 mg total) by mouth every 8 (eight) hours as needed.    Dispense:  60 tablet    Refill:  1    Time Spent: 31 minutes of total time (11:08 AM- 11:40 AM, 12:41 PM-12:50 PM) was spent on the date of the encounter performing the following actions: chart review prior to seeing the patient, obtaining history, performing a medically necessary exam, counseling on the treatment plan, placing orders, and documenting in our EHR.   Return precautions advised.  [name], MD

## 2019-09-21 DIAGNOSIS — Z951 Presence of aortocoronary bypass graft: Secondary | ICD-10-CM | POA: Diagnosis not present

## 2019-09-21 DIAGNOSIS — I2510 Atherosclerotic heart disease of native coronary artery without angina pectoris: Secondary | ICD-10-CM | POA: Diagnosis not present

## 2019-09-21 DIAGNOSIS — M21371 Foot drop, right foot: Secondary | ICD-10-CM | POA: Diagnosis not present

## 2019-09-21 DIAGNOSIS — L89322 Pressure ulcer of left buttock, stage 2: Secondary | ICD-10-CM | POA: Diagnosis not present

## 2019-09-21 DIAGNOSIS — D649 Anemia, unspecified: Secondary | ICD-10-CM | POA: Diagnosis not present

## 2019-09-21 DIAGNOSIS — Z7982 Long term (current) use of aspirin: Secondary | ICD-10-CM | POA: Diagnosis not present

## 2019-09-21 DIAGNOSIS — Z86010 Personal history of colonic polyps: Secondary | ICD-10-CM | POA: Diagnosis not present

## 2019-09-21 DIAGNOSIS — I714 Abdominal aortic aneurysm, without rupture: Secondary | ICD-10-CM | POA: Diagnosis not present

## 2019-09-21 DIAGNOSIS — D692 Other nonthrombocytopenic purpura: Secondary | ICD-10-CM | POA: Diagnosis not present

## 2019-09-21 DIAGNOSIS — E785 Hyperlipidemia, unspecified: Secondary | ICD-10-CM | POA: Diagnosis not present

## 2019-09-21 DIAGNOSIS — M810 Age-related osteoporosis without current pathological fracture: Secondary | ICD-10-CM | POA: Diagnosis not present

## 2019-09-21 DIAGNOSIS — Z9181 History of falling: Secondary | ICD-10-CM | POA: Diagnosis not present

## 2019-09-21 DIAGNOSIS — I6529 Occlusion and stenosis of unspecified carotid artery: Secondary | ICD-10-CM | POA: Diagnosis not present

## 2019-09-21 DIAGNOSIS — E119 Type 2 diabetes mellitus without complications: Secondary | ICD-10-CM | POA: Diagnosis not present

## 2019-09-21 DIAGNOSIS — K219 Gastro-esophageal reflux disease without esophagitis: Secondary | ICD-10-CM | POA: Diagnosis not present

## 2019-09-21 DIAGNOSIS — I252 Old myocardial infarction: Secondary | ICD-10-CM | POA: Diagnosis not present

## 2019-09-21 DIAGNOSIS — R296 Repeated falls: Secondary | ICD-10-CM | POA: Diagnosis not present

## 2019-09-21 DIAGNOSIS — M25551 Pain in right hip: Secondary | ICD-10-CM | POA: Diagnosis not present

## 2019-09-21 DIAGNOSIS — I10 Essential (primary) hypertension: Secondary | ICD-10-CM | POA: Diagnosis not present

## 2019-09-21 DIAGNOSIS — Z7984 Long term (current) use of oral hypoglycemic drugs: Secondary | ICD-10-CM | POA: Diagnosis not present

## 2019-09-21 DIAGNOSIS — M5416 Radiculopathy, lumbar region: Secondary | ICD-10-CM | POA: Diagnosis not present

## 2019-09-23 DIAGNOSIS — E119 Type 2 diabetes mellitus without complications: Secondary | ICD-10-CM | POA: Diagnosis not present

## 2019-09-23 DIAGNOSIS — I714 Abdominal aortic aneurysm, without rupture: Secondary | ICD-10-CM | POA: Diagnosis not present

## 2019-09-23 DIAGNOSIS — D692 Other nonthrombocytopenic purpura: Secondary | ICD-10-CM | POA: Diagnosis not present

## 2019-09-23 DIAGNOSIS — I10 Essential (primary) hypertension: Secondary | ICD-10-CM | POA: Diagnosis not present

## 2019-09-23 DIAGNOSIS — Z7982 Long term (current) use of aspirin: Secondary | ICD-10-CM | POA: Diagnosis not present

## 2019-09-23 DIAGNOSIS — M21371 Foot drop, right foot: Secondary | ICD-10-CM | POA: Diagnosis not present

## 2019-09-23 DIAGNOSIS — Z7984 Long term (current) use of oral hypoglycemic drugs: Secondary | ICD-10-CM | POA: Diagnosis not present

## 2019-09-23 DIAGNOSIS — K219 Gastro-esophageal reflux disease without esophagitis: Secondary | ICD-10-CM | POA: Diagnosis not present

## 2019-09-23 DIAGNOSIS — L89322 Pressure ulcer of left buttock, stage 2: Secondary | ICD-10-CM | POA: Diagnosis not present

## 2019-09-23 DIAGNOSIS — R296 Repeated falls: Secondary | ICD-10-CM | POA: Diagnosis not present

## 2019-09-23 DIAGNOSIS — Z9181 History of falling: Secondary | ICD-10-CM | POA: Diagnosis not present

## 2019-09-23 DIAGNOSIS — M25551 Pain in right hip: Secondary | ICD-10-CM | POA: Diagnosis not present

## 2019-09-23 DIAGNOSIS — D649 Anemia, unspecified: Secondary | ICD-10-CM | POA: Diagnosis not present

## 2019-09-23 DIAGNOSIS — I6529 Occlusion and stenosis of unspecified carotid artery: Secondary | ICD-10-CM | POA: Diagnosis not present

## 2019-09-23 DIAGNOSIS — Z951 Presence of aortocoronary bypass graft: Secondary | ICD-10-CM | POA: Diagnosis not present

## 2019-09-23 DIAGNOSIS — E785 Hyperlipidemia, unspecified: Secondary | ICD-10-CM | POA: Diagnosis not present

## 2019-09-23 DIAGNOSIS — Z86010 Personal history of colonic polyps: Secondary | ICD-10-CM | POA: Diagnosis not present

## 2019-09-23 DIAGNOSIS — M5416 Radiculopathy, lumbar region: Secondary | ICD-10-CM | POA: Diagnosis not present

## 2019-09-23 DIAGNOSIS — M810 Age-related osteoporosis without current pathological fracture: Secondary | ICD-10-CM | POA: Diagnosis not present

## 2019-09-23 DIAGNOSIS — I2510 Atherosclerotic heart disease of native coronary artery without angina pectoris: Secondary | ICD-10-CM | POA: Diagnosis not present

## 2019-09-23 DIAGNOSIS — I252 Old myocardial infarction: Secondary | ICD-10-CM | POA: Diagnosis not present

## 2019-09-25 ENCOUNTER — Telehealth: Payer: Self-pay | Admitting: Family Medicine

## 2019-09-25 DIAGNOSIS — Z7982 Long term (current) use of aspirin: Secondary | ICD-10-CM | POA: Diagnosis not present

## 2019-09-25 DIAGNOSIS — Z7984 Long term (current) use of oral hypoglycemic drugs: Secondary | ICD-10-CM | POA: Diagnosis not present

## 2019-09-25 DIAGNOSIS — R296 Repeated falls: Secondary | ICD-10-CM | POA: Diagnosis not present

## 2019-09-25 DIAGNOSIS — M25551 Pain in right hip: Secondary | ICD-10-CM | POA: Diagnosis not present

## 2019-09-25 DIAGNOSIS — Z86010 Personal history of colonic polyps: Secondary | ICD-10-CM | POA: Diagnosis not present

## 2019-09-25 DIAGNOSIS — K219 Gastro-esophageal reflux disease without esophagitis: Secondary | ICD-10-CM | POA: Diagnosis not present

## 2019-09-25 DIAGNOSIS — Z951 Presence of aortocoronary bypass graft: Secondary | ICD-10-CM | POA: Diagnosis not present

## 2019-09-25 DIAGNOSIS — M810 Age-related osteoporosis without current pathological fracture: Secondary | ICD-10-CM | POA: Diagnosis not present

## 2019-09-25 DIAGNOSIS — I2510 Atherosclerotic heart disease of native coronary artery without angina pectoris: Secondary | ICD-10-CM | POA: Diagnosis not present

## 2019-09-25 DIAGNOSIS — I252 Old myocardial infarction: Secondary | ICD-10-CM | POA: Diagnosis not present

## 2019-09-25 DIAGNOSIS — E119 Type 2 diabetes mellitus without complications: Secondary | ICD-10-CM | POA: Diagnosis not present

## 2019-09-25 DIAGNOSIS — I6529 Occlusion and stenosis of unspecified carotid artery: Secondary | ICD-10-CM | POA: Diagnosis not present

## 2019-09-25 DIAGNOSIS — I714 Abdominal aortic aneurysm, without rupture: Secondary | ICD-10-CM | POA: Diagnosis not present

## 2019-09-25 DIAGNOSIS — Z9181 History of falling: Secondary | ICD-10-CM | POA: Diagnosis not present

## 2019-09-25 DIAGNOSIS — D649 Anemia, unspecified: Secondary | ICD-10-CM | POA: Diagnosis not present

## 2019-09-25 DIAGNOSIS — E785 Hyperlipidemia, unspecified: Secondary | ICD-10-CM | POA: Diagnosis not present

## 2019-09-25 DIAGNOSIS — M5416 Radiculopathy, lumbar region: Secondary | ICD-10-CM | POA: Diagnosis not present

## 2019-09-25 DIAGNOSIS — L89322 Pressure ulcer of left buttock, stage 2: Secondary | ICD-10-CM | POA: Diagnosis not present

## 2019-09-25 DIAGNOSIS — M21371 Foot drop, right foot: Secondary | ICD-10-CM | POA: Diagnosis not present

## 2019-09-25 DIAGNOSIS — I10 Essential (primary) hypertension: Secondary | ICD-10-CM | POA: Diagnosis not present

## 2019-09-25 DIAGNOSIS — D692 Other nonthrombocytopenic purpura: Secondary | ICD-10-CM | POA: Diagnosis not present

## 2019-09-25 NOTE — Telephone Encounter (Signed)
Dana from [facility] called and wanted to get orders for his wound care.  Call back number [phone].

## 2019-09-25 NOTE — Telephone Encounter (Signed)
Called Dana back and provided VO for wound care.

## 2019-09-26 ENCOUNTER — Other Ambulatory Visit: Payer: Medicare Other

## 2019-09-26 ENCOUNTER — Ambulatory Visit: Payer: Medicare Other

## 2019-09-26 DIAGNOSIS — R197 Diarrhea, unspecified: Secondary | ICD-10-CM

## 2019-09-26 NOTE — Addendum Note (Signed)
Addended by: Doran Clay A on: 09/26/2019 01:48 PM   Modules accepted: Orders

## 2019-09-29 ENCOUNTER — Ambulatory Visit (HOSPITAL_COMMUNITY)
Admission: RE | Admit: 2019-09-29 | Payer: Medicare Other | Source: Ambulatory Visit | Attending: Internal Medicine | Admitting: Internal Medicine

## 2019-09-29 DIAGNOSIS — Z9181 History of falling: Secondary | ICD-10-CM | POA: Diagnosis not present

## 2019-09-29 DIAGNOSIS — E785 Hyperlipidemia, unspecified: Secondary | ICD-10-CM | POA: Diagnosis not present

## 2019-09-29 DIAGNOSIS — I2510 Atherosclerotic heart disease of native coronary artery without angina pectoris: Secondary | ICD-10-CM | POA: Diagnosis not present

## 2019-09-29 DIAGNOSIS — M5416 Radiculopathy, lumbar region: Secondary | ICD-10-CM | POA: Diagnosis not present

## 2019-09-29 DIAGNOSIS — Z951 Presence of aortocoronary bypass graft: Secondary | ICD-10-CM | POA: Diagnosis not present

## 2019-09-29 DIAGNOSIS — D649 Anemia, unspecified: Secondary | ICD-10-CM | POA: Diagnosis not present

## 2019-09-29 DIAGNOSIS — D692 Other nonthrombocytopenic purpura: Secondary | ICD-10-CM | POA: Diagnosis not present

## 2019-09-29 DIAGNOSIS — I252 Old myocardial infarction: Secondary | ICD-10-CM | POA: Diagnosis not present

## 2019-09-29 DIAGNOSIS — M25551 Pain in right hip: Secondary | ICD-10-CM | POA: Diagnosis not present

## 2019-09-29 DIAGNOSIS — Z7984 Long term (current) use of oral hypoglycemic drugs: Secondary | ICD-10-CM | POA: Diagnosis not present

## 2019-09-29 DIAGNOSIS — Z7982 Long term (current) use of aspirin: Secondary | ICD-10-CM | POA: Diagnosis not present

## 2019-09-29 DIAGNOSIS — M21371 Foot drop, right foot: Secondary | ICD-10-CM | POA: Diagnosis not present

## 2019-09-29 DIAGNOSIS — Z86010 Personal history of colonic polyps: Secondary | ICD-10-CM | POA: Diagnosis not present

## 2019-09-29 DIAGNOSIS — L89322 Pressure ulcer of left buttock, stage 2: Secondary | ICD-10-CM | POA: Diagnosis not present

## 2019-09-29 DIAGNOSIS — I10 Essential (primary) hypertension: Secondary | ICD-10-CM | POA: Diagnosis not present

## 2019-09-29 DIAGNOSIS — K219 Gastro-esophageal reflux disease without esophagitis: Secondary | ICD-10-CM | POA: Diagnosis not present

## 2019-09-29 DIAGNOSIS — R296 Repeated falls: Secondary | ICD-10-CM | POA: Diagnosis not present

## 2019-09-29 DIAGNOSIS — I6529 Occlusion and stenosis of unspecified carotid artery: Secondary | ICD-10-CM | POA: Diagnosis not present

## 2019-09-29 DIAGNOSIS — I714 Abdominal aortic aneurysm, without rupture: Secondary | ICD-10-CM | POA: Diagnosis not present

## 2019-09-29 DIAGNOSIS — E119 Type 2 diabetes mellitus without complications: Secondary | ICD-10-CM | POA: Diagnosis not present

## 2019-09-29 DIAGNOSIS — M810 Age-related osteoporosis without current pathological fracture: Secondary | ICD-10-CM | POA: Diagnosis not present

## 2019-09-30 DIAGNOSIS — L89323 Pressure ulcer of left buttock, stage 3: Secondary | ICD-10-CM | POA: Diagnosis not present

## 2019-09-30 LAB — FECAL LACTOFERRIN, QUANT
Fecal Lactoferrin: POSITIVE — AB
MICRO NUMBER:: 10581499
SPECIMEN QUALITY:: ADEQUATE

## 2019-09-30 LAB — STOOL CULTURE
MICRO NUMBER:: 10581497
MICRO NUMBER:: 10581498
MICRO NUMBER:: 10581500
SHIGA RESULT:: NOT DETECTED
SPECIMEN QUALITY:: ADEQUATE
SPECIMEN QUALITY:: ADEQUATE
SPECIMEN QUALITY:: ADEQUATE

## 2019-10-01 DIAGNOSIS — I10 Essential (primary) hypertension: Secondary | ICD-10-CM | POA: Diagnosis not present

## 2019-10-01 DIAGNOSIS — E785 Hyperlipidemia, unspecified: Secondary | ICD-10-CM

## 2019-10-01 DIAGNOSIS — E291 Testicular hypofunction: Secondary | ICD-10-CM

## 2019-10-01 DIAGNOSIS — R296 Repeated falls: Secondary | ICD-10-CM | POA: Diagnosis not present

## 2019-10-01 DIAGNOSIS — Z7982 Long term (current) use of aspirin: Secondary | ICD-10-CM | POA: Diagnosis not present

## 2019-10-01 DIAGNOSIS — M21371 Foot drop, right foot: Secondary | ICD-10-CM | POA: Diagnosis not present

## 2019-10-01 DIAGNOSIS — I6529 Occlusion and stenosis of unspecified carotid artery: Secondary | ICD-10-CM

## 2019-10-01 DIAGNOSIS — Z86010 Personal history of colonic polyps: Secondary | ICD-10-CM

## 2019-10-01 DIAGNOSIS — Z951 Presence of aortocoronary bypass graft: Secondary | ICD-10-CM | POA: Diagnosis not present

## 2019-10-01 DIAGNOSIS — M25551 Pain in right hip: Secondary | ICD-10-CM | POA: Diagnosis not present

## 2019-10-01 DIAGNOSIS — I2510 Atherosclerotic heart disease of native coronary artery without angina pectoris: Secondary | ICD-10-CM

## 2019-10-01 DIAGNOSIS — Z9181 History of falling: Secondary | ICD-10-CM | POA: Diagnosis not present

## 2019-10-01 DIAGNOSIS — M810 Age-related osteoporosis without current pathological fracture: Secondary | ICD-10-CM

## 2019-10-01 DIAGNOSIS — M5416 Radiculopathy, lumbar region: Secondary | ICD-10-CM | POA: Diagnosis not present

## 2019-10-01 DIAGNOSIS — K219 Gastro-esophageal reflux disease without esophagitis: Secondary | ICD-10-CM

## 2019-10-01 DIAGNOSIS — I714 Abdominal aortic aneurysm, without rupture: Secondary | ICD-10-CM

## 2019-10-01 DIAGNOSIS — I252 Old myocardial infarction: Secondary | ICD-10-CM

## 2019-10-01 DIAGNOSIS — Z7984 Long term (current) use of oral hypoglycemic drugs: Secondary | ICD-10-CM | POA: Diagnosis not present

## 2019-10-01 DIAGNOSIS — D692 Other nonthrombocytopenic purpura: Secondary | ICD-10-CM

## 2019-10-01 DIAGNOSIS — E119 Type 2 diabetes mellitus without complications: Secondary | ICD-10-CM | POA: Diagnosis not present

## 2019-10-01 DIAGNOSIS — R32 Unspecified urinary incontinence: Secondary | ICD-10-CM

## 2019-10-01 DIAGNOSIS — L89322 Pressure ulcer of left buttock, stage 2: Secondary | ICD-10-CM | POA: Diagnosis not present

## 2019-10-01 DIAGNOSIS — D649 Anemia, unspecified: Secondary | ICD-10-CM

## 2019-10-02 ENCOUNTER — Encounter: Payer: Self-pay | Admitting: Internal Medicine

## 2019-10-02 ENCOUNTER — Other Ambulatory Visit: Payer: Self-pay | Admitting: *Deleted

## 2019-10-02 DIAGNOSIS — Z7984 Long term (current) use of oral hypoglycemic drugs: Secondary | ICD-10-CM | POA: Diagnosis not present

## 2019-10-02 DIAGNOSIS — E785 Hyperlipidemia, unspecified: Secondary | ICD-10-CM | POA: Diagnosis not present

## 2019-10-02 DIAGNOSIS — I6529 Occlusion and stenosis of unspecified carotid artery: Secondary | ICD-10-CM | POA: Diagnosis not present

## 2019-10-02 DIAGNOSIS — R197 Diarrhea, unspecified: Secondary | ICD-10-CM

## 2019-10-02 DIAGNOSIS — I2510 Atherosclerotic heart disease of native coronary artery without angina pectoris: Secondary | ICD-10-CM | POA: Diagnosis not present

## 2019-10-02 DIAGNOSIS — L89322 Pressure ulcer of left buttock, stage 2: Secondary | ICD-10-CM | POA: Diagnosis not present

## 2019-10-02 DIAGNOSIS — D649 Anemia, unspecified: Secondary | ICD-10-CM | POA: Diagnosis not present

## 2019-10-02 DIAGNOSIS — I714 Abdominal aortic aneurysm, without rupture: Secondary | ICD-10-CM | POA: Diagnosis not present

## 2019-10-02 DIAGNOSIS — Z951 Presence of aortocoronary bypass graft: Secondary | ICD-10-CM | POA: Diagnosis not present

## 2019-10-02 DIAGNOSIS — E119 Type 2 diabetes mellitus without complications: Secondary | ICD-10-CM | POA: Diagnosis not present

## 2019-10-02 DIAGNOSIS — D692 Other nonthrombocytopenic purpura: Secondary | ICD-10-CM | POA: Diagnosis not present

## 2019-10-02 DIAGNOSIS — R296 Repeated falls: Secondary | ICD-10-CM | POA: Diagnosis not present

## 2019-10-02 DIAGNOSIS — K219 Gastro-esophageal reflux disease without esophagitis: Secondary | ICD-10-CM | POA: Diagnosis not present

## 2019-10-02 DIAGNOSIS — I10 Essential (primary) hypertension: Secondary | ICD-10-CM | POA: Diagnosis not present

## 2019-10-02 DIAGNOSIS — Z9181 History of falling: Secondary | ICD-10-CM | POA: Diagnosis not present

## 2019-10-02 DIAGNOSIS — Z86010 Personal history of colonic polyps: Secondary | ICD-10-CM | POA: Diagnosis not present

## 2019-10-02 DIAGNOSIS — M5416 Radiculopathy, lumbar region: Secondary | ICD-10-CM | POA: Diagnosis not present

## 2019-10-02 DIAGNOSIS — M25551 Pain in right hip: Secondary | ICD-10-CM | POA: Diagnosis not present

## 2019-10-02 DIAGNOSIS — M21371 Foot drop, right foot: Secondary | ICD-10-CM | POA: Diagnosis not present

## 2019-10-02 DIAGNOSIS — I252 Old myocardial infarction: Secondary | ICD-10-CM | POA: Diagnosis not present

## 2019-10-02 DIAGNOSIS — M810 Age-related osteoporosis without current pathological fracture: Secondary | ICD-10-CM | POA: Diagnosis not present

## 2019-10-02 DIAGNOSIS — Z7982 Long term (current) use of aspirin: Secondary | ICD-10-CM | POA: Diagnosis not present

## 2019-10-02 NOTE — Progress Notes (Signed)
amb to Gi

## 2019-10-03 DIAGNOSIS — M25551 Pain in right hip: Secondary | ICD-10-CM | POA: Diagnosis not present

## 2019-10-03 DIAGNOSIS — I714 Abdominal aortic aneurysm, without rupture: Secondary | ICD-10-CM | POA: Diagnosis not present

## 2019-10-03 DIAGNOSIS — I6529 Occlusion and stenosis of unspecified carotid artery: Secondary | ICD-10-CM | POA: Diagnosis not present

## 2019-10-03 DIAGNOSIS — K219 Gastro-esophageal reflux disease without esophagitis: Secondary | ICD-10-CM | POA: Diagnosis not present

## 2019-10-03 DIAGNOSIS — E119 Type 2 diabetes mellitus without complications: Secondary | ICD-10-CM | POA: Diagnosis not present

## 2019-10-03 DIAGNOSIS — Z9181 History of falling: Secondary | ICD-10-CM | POA: Diagnosis not present

## 2019-10-03 DIAGNOSIS — D692 Other nonthrombocytopenic purpura: Secondary | ICD-10-CM | POA: Diagnosis not present

## 2019-10-03 DIAGNOSIS — L89322 Pressure ulcer of left buttock, stage 2: Secondary | ICD-10-CM | POA: Diagnosis not present

## 2019-10-03 DIAGNOSIS — E785 Hyperlipidemia, unspecified: Secondary | ICD-10-CM | POA: Diagnosis not present

## 2019-10-03 DIAGNOSIS — M810 Age-related osteoporosis without current pathological fracture: Secondary | ICD-10-CM | POA: Diagnosis not present

## 2019-10-03 DIAGNOSIS — M5416 Radiculopathy, lumbar region: Secondary | ICD-10-CM | POA: Diagnosis not present

## 2019-10-03 DIAGNOSIS — Z951 Presence of aortocoronary bypass graft: Secondary | ICD-10-CM | POA: Diagnosis not present

## 2019-10-03 DIAGNOSIS — I10 Essential (primary) hypertension: Secondary | ICD-10-CM | POA: Diagnosis not present

## 2019-10-03 DIAGNOSIS — Z7982 Long term (current) use of aspirin: Secondary | ICD-10-CM | POA: Diagnosis not present

## 2019-10-03 DIAGNOSIS — M21371 Foot drop, right foot: Secondary | ICD-10-CM | POA: Diagnosis not present

## 2019-10-03 DIAGNOSIS — Z7984 Long term (current) use of oral hypoglycemic drugs: Secondary | ICD-10-CM | POA: Diagnosis not present

## 2019-10-03 DIAGNOSIS — Z86010 Personal history of colonic polyps: Secondary | ICD-10-CM | POA: Diagnosis not present

## 2019-10-03 DIAGNOSIS — I252 Old myocardial infarction: Secondary | ICD-10-CM | POA: Diagnosis not present

## 2019-10-03 DIAGNOSIS — I2510 Atherosclerotic heart disease of native coronary artery without angina pectoris: Secondary | ICD-10-CM | POA: Diagnosis not present

## 2019-10-03 DIAGNOSIS — D649 Anemia, unspecified: Secondary | ICD-10-CM | POA: Diagnosis not present

## 2019-10-03 DIAGNOSIS — R296 Repeated falls: Secondary | ICD-10-CM | POA: Diagnosis not present

## 2019-10-06 DIAGNOSIS — Z951 Presence of aortocoronary bypass graft: Secondary | ICD-10-CM | POA: Diagnosis not present

## 2019-10-06 DIAGNOSIS — D649 Anemia, unspecified: Secondary | ICD-10-CM | POA: Diagnosis not present

## 2019-10-06 DIAGNOSIS — I2510 Atherosclerotic heart disease of native coronary artery without angina pectoris: Secondary | ICD-10-CM | POA: Diagnosis not present

## 2019-10-06 DIAGNOSIS — I6529 Occlusion and stenosis of unspecified carotid artery: Secondary | ICD-10-CM | POA: Diagnosis not present

## 2019-10-06 DIAGNOSIS — R296 Repeated falls: Secondary | ICD-10-CM | POA: Diagnosis not present

## 2019-10-06 DIAGNOSIS — E119 Type 2 diabetes mellitus without complications: Secondary | ICD-10-CM | POA: Diagnosis not present

## 2019-10-06 DIAGNOSIS — M810 Age-related osteoporosis without current pathological fracture: Secondary | ICD-10-CM | POA: Diagnosis not present

## 2019-10-06 DIAGNOSIS — D692 Other nonthrombocytopenic purpura: Secondary | ICD-10-CM | POA: Diagnosis not present

## 2019-10-06 DIAGNOSIS — L89322 Pressure ulcer of left buttock, stage 2: Secondary | ICD-10-CM | POA: Diagnosis not present

## 2019-10-06 DIAGNOSIS — M25551 Pain in right hip: Secondary | ICD-10-CM | POA: Diagnosis not present

## 2019-10-06 DIAGNOSIS — Z9181 History of falling: Secondary | ICD-10-CM | POA: Diagnosis not present

## 2019-10-06 DIAGNOSIS — K219 Gastro-esophageal reflux disease without esophagitis: Secondary | ICD-10-CM | POA: Diagnosis not present

## 2019-10-06 DIAGNOSIS — Z7984 Long term (current) use of oral hypoglycemic drugs: Secondary | ICD-10-CM | POA: Diagnosis not present

## 2019-10-06 DIAGNOSIS — I714 Abdominal aortic aneurysm, without rupture: Secondary | ICD-10-CM | POA: Diagnosis not present

## 2019-10-06 DIAGNOSIS — Z7982 Long term (current) use of aspirin: Secondary | ICD-10-CM | POA: Diagnosis not present

## 2019-10-06 DIAGNOSIS — I10 Essential (primary) hypertension: Secondary | ICD-10-CM | POA: Diagnosis not present

## 2019-10-06 DIAGNOSIS — M21371 Foot drop, right foot: Secondary | ICD-10-CM | POA: Diagnosis not present

## 2019-10-06 DIAGNOSIS — E785 Hyperlipidemia, unspecified: Secondary | ICD-10-CM | POA: Diagnosis not present

## 2019-10-06 DIAGNOSIS — M5416 Radiculopathy, lumbar region: Secondary | ICD-10-CM | POA: Diagnosis not present

## 2019-10-06 DIAGNOSIS — Z86010 Personal history of colonic polyps: Secondary | ICD-10-CM | POA: Diagnosis not present

## 2019-10-06 DIAGNOSIS — I252 Old myocardial infarction: Secondary | ICD-10-CM | POA: Diagnosis not present

## 2019-10-08 DIAGNOSIS — I10 Essential (primary) hypertension: Secondary | ICD-10-CM | POA: Diagnosis not present

## 2019-10-08 DIAGNOSIS — M5416 Radiculopathy, lumbar region: Secondary | ICD-10-CM | POA: Diagnosis not present

## 2019-10-08 DIAGNOSIS — I2510 Atherosclerotic heart disease of native coronary artery without angina pectoris: Secondary | ICD-10-CM | POA: Diagnosis not present

## 2019-10-08 DIAGNOSIS — Z951 Presence of aortocoronary bypass graft: Secondary | ICD-10-CM | POA: Diagnosis not present

## 2019-10-08 DIAGNOSIS — K219 Gastro-esophageal reflux disease without esophagitis: Secondary | ICD-10-CM | POA: Diagnosis not present

## 2019-10-08 DIAGNOSIS — Z86010 Personal history of colonic polyps: Secondary | ICD-10-CM | POA: Diagnosis not present

## 2019-10-08 DIAGNOSIS — I6529 Occlusion and stenosis of unspecified carotid artery: Secondary | ICD-10-CM | POA: Diagnosis not present

## 2019-10-08 DIAGNOSIS — M25551 Pain in right hip: Secondary | ICD-10-CM | POA: Diagnosis not present

## 2019-10-08 DIAGNOSIS — D649 Anemia, unspecified: Secondary | ICD-10-CM | POA: Diagnosis not present

## 2019-10-08 DIAGNOSIS — E119 Type 2 diabetes mellitus without complications: Secondary | ICD-10-CM | POA: Diagnosis not present

## 2019-10-08 DIAGNOSIS — L89322 Pressure ulcer of left buttock, stage 2: Secondary | ICD-10-CM | POA: Diagnosis not present

## 2019-10-08 DIAGNOSIS — I252 Old myocardial infarction: Secondary | ICD-10-CM | POA: Diagnosis not present

## 2019-10-08 DIAGNOSIS — M21371 Foot drop, right foot: Secondary | ICD-10-CM | POA: Diagnosis not present

## 2019-10-08 DIAGNOSIS — M810 Age-related osteoporosis without current pathological fracture: Secondary | ICD-10-CM | POA: Diagnosis not present

## 2019-10-08 DIAGNOSIS — E785 Hyperlipidemia, unspecified: Secondary | ICD-10-CM | POA: Diagnosis not present

## 2019-10-08 DIAGNOSIS — Z7984 Long term (current) use of oral hypoglycemic drugs: Secondary | ICD-10-CM | POA: Diagnosis not present

## 2019-10-08 DIAGNOSIS — Z9181 History of falling: Secondary | ICD-10-CM | POA: Diagnosis not present

## 2019-10-08 DIAGNOSIS — D692 Other nonthrombocytopenic purpura: Secondary | ICD-10-CM | POA: Diagnosis not present

## 2019-10-08 DIAGNOSIS — R296 Repeated falls: Secondary | ICD-10-CM | POA: Diagnosis not present

## 2019-10-08 DIAGNOSIS — I714 Abdominal aortic aneurysm, without rupture: Secondary | ICD-10-CM | POA: Diagnosis not present

## 2019-10-08 DIAGNOSIS — Z7982 Long term (current) use of aspirin: Secondary | ICD-10-CM | POA: Diagnosis not present

## 2019-10-09 DIAGNOSIS — D692 Other nonthrombocytopenic purpura: Secondary | ICD-10-CM | POA: Diagnosis not present

## 2019-10-09 DIAGNOSIS — I10 Essential (primary) hypertension: Secondary | ICD-10-CM | POA: Diagnosis not present

## 2019-10-09 DIAGNOSIS — I252 Old myocardial infarction: Secondary | ICD-10-CM | POA: Diagnosis not present

## 2019-10-09 DIAGNOSIS — M21371 Foot drop, right foot: Secondary | ICD-10-CM | POA: Diagnosis not present

## 2019-10-09 DIAGNOSIS — Z7984 Long term (current) use of oral hypoglycemic drugs: Secondary | ICD-10-CM | POA: Diagnosis not present

## 2019-10-09 DIAGNOSIS — M810 Age-related osteoporosis without current pathological fracture: Secondary | ICD-10-CM | POA: Diagnosis not present

## 2019-10-09 DIAGNOSIS — L89322 Pressure ulcer of left buttock, stage 2: Secondary | ICD-10-CM | POA: Diagnosis not present

## 2019-10-09 DIAGNOSIS — K219 Gastro-esophageal reflux disease without esophagitis: Secondary | ICD-10-CM | POA: Diagnosis not present

## 2019-10-09 DIAGNOSIS — E119 Type 2 diabetes mellitus without complications: Secondary | ICD-10-CM | POA: Diagnosis not present

## 2019-10-09 DIAGNOSIS — I2510 Atherosclerotic heart disease of native coronary artery without angina pectoris: Secondary | ICD-10-CM | POA: Diagnosis not present

## 2019-10-09 DIAGNOSIS — D649 Anemia, unspecified: Secondary | ICD-10-CM | POA: Diagnosis not present

## 2019-10-09 DIAGNOSIS — I714 Abdominal aortic aneurysm, without rupture: Secondary | ICD-10-CM | POA: Diagnosis not present

## 2019-10-09 DIAGNOSIS — Z86010 Personal history of colonic polyps: Secondary | ICD-10-CM | POA: Diagnosis not present

## 2019-10-09 DIAGNOSIS — M25551 Pain in right hip: Secondary | ICD-10-CM | POA: Diagnosis not present

## 2019-10-09 DIAGNOSIS — I6529 Occlusion and stenosis of unspecified carotid artery: Secondary | ICD-10-CM | POA: Diagnosis not present

## 2019-10-09 DIAGNOSIS — E785 Hyperlipidemia, unspecified: Secondary | ICD-10-CM | POA: Diagnosis not present

## 2019-10-09 DIAGNOSIS — M5416 Radiculopathy, lumbar region: Secondary | ICD-10-CM | POA: Diagnosis not present

## 2019-10-09 DIAGNOSIS — Z9181 History of falling: Secondary | ICD-10-CM | POA: Diagnosis not present

## 2019-10-09 DIAGNOSIS — Z951 Presence of aortocoronary bypass graft: Secondary | ICD-10-CM | POA: Diagnosis not present

## 2019-10-09 DIAGNOSIS — Z7982 Long term (current) use of aspirin: Secondary | ICD-10-CM | POA: Diagnosis not present

## 2019-10-09 DIAGNOSIS — R296 Repeated falls: Secondary | ICD-10-CM | POA: Diagnosis not present

## 2019-10-10 DIAGNOSIS — M21371 Foot drop, right foot: Secondary | ICD-10-CM | POA: Diagnosis not present

## 2019-10-10 DIAGNOSIS — Z7982 Long term (current) use of aspirin: Secondary | ICD-10-CM | POA: Diagnosis not present

## 2019-10-10 DIAGNOSIS — L89322 Pressure ulcer of left buttock, stage 2: Secondary | ICD-10-CM | POA: Diagnosis not present

## 2019-10-10 DIAGNOSIS — M25551 Pain in right hip: Secondary | ICD-10-CM | POA: Diagnosis not present

## 2019-10-10 DIAGNOSIS — I252 Old myocardial infarction: Secondary | ICD-10-CM | POA: Diagnosis not present

## 2019-10-10 DIAGNOSIS — I6529 Occlusion and stenosis of unspecified carotid artery: Secondary | ICD-10-CM | POA: Diagnosis not present

## 2019-10-10 DIAGNOSIS — I10 Essential (primary) hypertension: Secondary | ICD-10-CM | POA: Diagnosis not present

## 2019-10-10 DIAGNOSIS — Z86010 Personal history of colonic polyps: Secondary | ICD-10-CM | POA: Diagnosis not present

## 2019-10-10 DIAGNOSIS — K219 Gastro-esophageal reflux disease without esophagitis: Secondary | ICD-10-CM | POA: Diagnosis not present

## 2019-10-10 DIAGNOSIS — R296 Repeated falls: Secondary | ICD-10-CM | POA: Diagnosis not present

## 2019-10-10 DIAGNOSIS — Z951 Presence of aortocoronary bypass graft: Secondary | ICD-10-CM | POA: Diagnosis not present

## 2019-10-10 DIAGNOSIS — Z7984 Long term (current) use of oral hypoglycemic drugs: Secondary | ICD-10-CM | POA: Diagnosis not present

## 2019-10-10 DIAGNOSIS — M810 Age-related osteoporosis without current pathological fracture: Secondary | ICD-10-CM | POA: Diagnosis not present

## 2019-10-10 DIAGNOSIS — I2510 Atherosclerotic heart disease of native coronary artery without angina pectoris: Secondary | ICD-10-CM | POA: Diagnosis not present

## 2019-10-10 DIAGNOSIS — E119 Type 2 diabetes mellitus without complications: Secondary | ICD-10-CM | POA: Diagnosis not present

## 2019-10-10 DIAGNOSIS — M5416 Radiculopathy, lumbar region: Secondary | ICD-10-CM | POA: Diagnosis not present

## 2019-10-10 DIAGNOSIS — D649 Anemia, unspecified: Secondary | ICD-10-CM | POA: Diagnosis not present

## 2019-10-10 DIAGNOSIS — D692 Other nonthrombocytopenic purpura: Secondary | ICD-10-CM | POA: Diagnosis not present

## 2019-10-10 DIAGNOSIS — Z9181 History of falling: Secondary | ICD-10-CM | POA: Diagnosis not present

## 2019-10-10 DIAGNOSIS — I714 Abdominal aortic aneurysm, without rupture: Secondary | ICD-10-CM | POA: Diagnosis not present

## 2019-10-10 DIAGNOSIS — E785 Hyperlipidemia, unspecified: Secondary | ICD-10-CM | POA: Diagnosis not present

## 2019-10-13 ENCOUNTER — Other Ambulatory Visit: Payer: Self-pay | Admitting: Family Medicine

## 2019-10-13 DIAGNOSIS — E785 Hyperlipidemia, unspecified: Secondary | ICD-10-CM | POA: Diagnosis not present

## 2019-10-13 DIAGNOSIS — L89322 Pressure ulcer of left buttock, stage 2: Secondary | ICD-10-CM | POA: Diagnosis not present

## 2019-10-13 DIAGNOSIS — I252 Old myocardial infarction: Secondary | ICD-10-CM | POA: Diagnosis not present

## 2019-10-13 DIAGNOSIS — D692 Other nonthrombocytopenic purpura: Secondary | ICD-10-CM | POA: Diagnosis not present

## 2019-10-13 DIAGNOSIS — M5416 Radiculopathy, lumbar region: Secondary | ICD-10-CM | POA: Diagnosis not present

## 2019-10-13 DIAGNOSIS — M25551 Pain in right hip: Secondary | ICD-10-CM | POA: Diagnosis not present

## 2019-10-13 DIAGNOSIS — I10 Essential (primary) hypertension: Secondary | ICD-10-CM | POA: Diagnosis not present

## 2019-10-13 DIAGNOSIS — I6529 Occlusion and stenosis of unspecified carotid artery: Secondary | ICD-10-CM | POA: Diagnosis not present

## 2019-10-13 DIAGNOSIS — K219 Gastro-esophageal reflux disease without esophagitis: Secondary | ICD-10-CM | POA: Diagnosis not present

## 2019-10-13 DIAGNOSIS — Z86010 Personal history of colonic polyps: Secondary | ICD-10-CM | POA: Diagnosis not present

## 2019-10-13 DIAGNOSIS — I2510 Atherosclerotic heart disease of native coronary artery without angina pectoris: Secondary | ICD-10-CM | POA: Diagnosis not present

## 2019-10-13 DIAGNOSIS — Z9181 History of falling: Secondary | ICD-10-CM | POA: Diagnosis not present

## 2019-10-13 DIAGNOSIS — R296 Repeated falls: Secondary | ICD-10-CM | POA: Diagnosis not present

## 2019-10-13 DIAGNOSIS — E119 Type 2 diabetes mellitus without complications: Secondary | ICD-10-CM | POA: Diagnosis not present

## 2019-10-13 DIAGNOSIS — Z7982 Long term (current) use of aspirin: Secondary | ICD-10-CM | POA: Diagnosis not present

## 2019-10-13 DIAGNOSIS — D649 Anemia, unspecified: Secondary | ICD-10-CM | POA: Diagnosis not present

## 2019-10-13 DIAGNOSIS — Z951 Presence of aortocoronary bypass graft: Secondary | ICD-10-CM | POA: Diagnosis not present

## 2019-10-13 DIAGNOSIS — I714 Abdominal aortic aneurysm, without rupture: Secondary | ICD-10-CM | POA: Diagnosis not present

## 2019-10-13 DIAGNOSIS — M21371 Foot drop, right foot: Secondary | ICD-10-CM | POA: Diagnosis not present

## 2019-10-13 DIAGNOSIS — M810 Age-related osteoporosis without current pathological fracture: Secondary | ICD-10-CM | POA: Diagnosis not present

## 2019-10-13 DIAGNOSIS — Z7984 Long term (current) use of oral hypoglycemic drugs: Secondary | ICD-10-CM | POA: Diagnosis not present

## 2019-10-14 DIAGNOSIS — I6529 Occlusion and stenosis of unspecified carotid artery: Secondary | ICD-10-CM | POA: Diagnosis not present

## 2019-10-14 DIAGNOSIS — D649 Anemia, unspecified: Secondary | ICD-10-CM | POA: Diagnosis not present

## 2019-10-14 DIAGNOSIS — I2510 Atherosclerotic heart disease of native coronary artery without angina pectoris: Secondary | ICD-10-CM | POA: Diagnosis not present

## 2019-10-14 DIAGNOSIS — Z951 Presence of aortocoronary bypass graft: Secondary | ICD-10-CM | POA: Diagnosis not present

## 2019-10-14 DIAGNOSIS — E785 Hyperlipidemia, unspecified: Secondary | ICD-10-CM | POA: Diagnosis not present

## 2019-10-14 DIAGNOSIS — Z7984 Long term (current) use of oral hypoglycemic drugs: Secondary | ICD-10-CM | POA: Diagnosis not present

## 2019-10-14 DIAGNOSIS — M25551 Pain in right hip: Secondary | ICD-10-CM | POA: Diagnosis not present

## 2019-10-14 DIAGNOSIS — L89322 Pressure ulcer of left buttock, stage 2: Secondary | ICD-10-CM | POA: Diagnosis not present

## 2019-10-14 DIAGNOSIS — M21371 Foot drop, right foot: Secondary | ICD-10-CM | POA: Diagnosis not present

## 2019-10-14 DIAGNOSIS — R296 Repeated falls: Secondary | ICD-10-CM | POA: Diagnosis not present

## 2019-10-14 DIAGNOSIS — I714 Abdominal aortic aneurysm, without rupture: Secondary | ICD-10-CM | POA: Diagnosis not present

## 2019-10-14 DIAGNOSIS — E119 Type 2 diabetes mellitus without complications: Secondary | ICD-10-CM | POA: Diagnosis not present

## 2019-10-14 DIAGNOSIS — M5416 Radiculopathy, lumbar region: Secondary | ICD-10-CM | POA: Diagnosis not present

## 2019-10-14 DIAGNOSIS — Z86010 Personal history of colonic polyps: Secondary | ICD-10-CM | POA: Diagnosis not present

## 2019-10-14 DIAGNOSIS — Z9181 History of falling: Secondary | ICD-10-CM | POA: Diagnosis not present

## 2019-10-14 DIAGNOSIS — M810 Age-related osteoporosis without current pathological fracture: Secondary | ICD-10-CM | POA: Diagnosis not present

## 2019-10-14 DIAGNOSIS — K219 Gastro-esophageal reflux disease without esophagitis: Secondary | ICD-10-CM | POA: Diagnosis not present

## 2019-10-14 DIAGNOSIS — D692 Other nonthrombocytopenic purpura: Secondary | ICD-10-CM | POA: Diagnosis not present

## 2019-10-14 DIAGNOSIS — I10 Essential (primary) hypertension: Secondary | ICD-10-CM | POA: Diagnosis not present

## 2019-10-14 DIAGNOSIS — I252 Old myocardial infarction: Secondary | ICD-10-CM | POA: Diagnosis not present

## 2019-10-14 DIAGNOSIS — Z7982 Long term (current) use of aspirin: Secondary | ICD-10-CM | POA: Diagnosis not present

## 2019-10-16 DIAGNOSIS — D692 Other nonthrombocytopenic purpura: Secondary | ICD-10-CM | POA: Diagnosis not present

## 2019-10-16 DIAGNOSIS — E119 Type 2 diabetes mellitus without complications: Secondary | ICD-10-CM | POA: Diagnosis not present

## 2019-10-16 DIAGNOSIS — Z951 Presence of aortocoronary bypass graft: Secondary | ICD-10-CM | POA: Diagnosis not present

## 2019-10-16 DIAGNOSIS — I10 Essential (primary) hypertension: Secondary | ICD-10-CM | POA: Diagnosis not present

## 2019-10-16 DIAGNOSIS — M21371 Foot drop, right foot: Secondary | ICD-10-CM | POA: Diagnosis not present

## 2019-10-16 DIAGNOSIS — M5416 Radiculopathy, lumbar region: Secondary | ICD-10-CM | POA: Diagnosis not present

## 2019-10-16 DIAGNOSIS — Z7982 Long term (current) use of aspirin: Secondary | ICD-10-CM | POA: Diagnosis not present

## 2019-10-16 DIAGNOSIS — R296 Repeated falls: Secondary | ICD-10-CM | POA: Diagnosis not present

## 2019-10-16 DIAGNOSIS — I252 Old myocardial infarction: Secondary | ICD-10-CM | POA: Diagnosis not present

## 2019-10-16 DIAGNOSIS — M810 Age-related osteoporosis without current pathological fracture: Secondary | ICD-10-CM | POA: Diagnosis not present

## 2019-10-16 DIAGNOSIS — E785 Hyperlipidemia, unspecified: Secondary | ICD-10-CM | POA: Diagnosis not present

## 2019-10-16 DIAGNOSIS — Z9181 History of falling: Secondary | ICD-10-CM | POA: Diagnosis not present

## 2019-10-16 DIAGNOSIS — M25551 Pain in right hip: Secondary | ICD-10-CM | POA: Diagnosis not present

## 2019-10-16 DIAGNOSIS — Z7984 Long term (current) use of oral hypoglycemic drugs: Secondary | ICD-10-CM | POA: Diagnosis not present

## 2019-10-16 DIAGNOSIS — I714 Abdominal aortic aneurysm, without rupture: Secondary | ICD-10-CM | POA: Diagnosis not present

## 2019-10-16 DIAGNOSIS — I2510 Atherosclerotic heart disease of native coronary artery without angina pectoris: Secondary | ICD-10-CM | POA: Diagnosis not present

## 2019-10-16 DIAGNOSIS — K219 Gastro-esophageal reflux disease without esophagitis: Secondary | ICD-10-CM | POA: Diagnosis not present

## 2019-10-16 DIAGNOSIS — D649 Anemia, unspecified: Secondary | ICD-10-CM | POA: Diagnosis not present

## 2019-10-16 DIAGNOSIS — I6529 Occlusion and stenosis of unspecified carotid artery: Secondary | ICD-10-CM | POA: Diagnosis not present

## 2019-10-16 DIAGNOSIS — Z86010 Personal history of colonic polyps: Secondary | ICD-10-CM | POA: Diagnosis not present

## 2019-10-16 DIAGNOSIS — L89322 Pressure ulcer of left buttock, stage 2: Secondary | ICD-10-CM | POA: Diagnosis not present

## 2019-10-17 DIAGNOSIS — M810 Age-related osteoporosis without current pathological fracture: Secondary | ICD-10-CM | POA: Diagnosis not present

## 2019-10-17 DIAGNOSIS — Z9181 History of falling: Secondary | ICD-10-CM | POA: Diagnosis not present

## 2019-10-17 DIAGNOSIS — I2510 Atherosclerotic heart disease of native coronary artery without angina pectoris: Secondary | ICD-10-CM | POA: Diagnosis not present

## 2019-10-17 DIAGNOSIS — M25551 Pain in right hip: Secondary | ICD-10-CM | POA: Diagnosis not present

## 2019-10-17 DIAGNOSIS — Z7982 Long term (current) use of aspirin: Secondary | ICD-10-CM | POA: Diagnosis not present

## 2019-10-17 DIAGNOSIS — E119 Type 2 diabetes mellitus without complications: Secondary | ICD-10-CM | POA: Diagnosis not present

## 2019-10-17 DIAGNOSIS — D692 Other nonthrombocytopenic purpura: Secondary | ICD-10-CM | POA: Diagnosis not present

## 2019-10-17 DIAGNOSIS — I10 Essential (primary) hypertension: Secondary | ICD-10-CM | POA: Diagnosis not present

## 2019-10-17 DIAGNOSIS — R296 Repeated falls: Secondary | ICD-10-CM | POA: Diagnosis not present

## 2019-10-17 DIAGNOSIS — D649 Anemia, unspecified: Secondary | ICD-10-CM | POA: Diagnosis not present

## 2019-10-17 DIAGNOSIS — Z7984 Long term (current) use of oral hypoglycemic drugs: Secondary | ICD-10-CM | POA: Diagnosis not present

## 2019-10-17 DIAGNOSIS — I6529 Occlusion and stenosis of unspecified carotid artery: Secondary | ICD-10-CM | POA: Diagnosis not present

## 2019-10-17 DIAGNOSIS — Z86010 Personal history of colonic polyps: Secondary | ICD-10-CM | POA: Diagnosis not present

## 2019-10-17 DIAGNOSIS — M5416 Radiculopathy, lumbar region: Secondary | ICD-10-CM | POA: Diagnosis not present

## 2019-10-17 DIAGNOSIS — Z951 Presence of aortocoronary bypass graft: Secondary | ICD-10-CM | POA: Diagnosis not present

## 2019-10-17 DIAGNOSIS — I714 Abdominal aortic aneurysm, without rupture: Secondary | ICD-10-CM | POA: Diagnosis not present

## 2019-10-17 DIAGNOSIS — I252 Old myocardial infarction: Secondary | ICD-10-CM | POA: Diagnosis not present

## 2019-10-17 DIAGNOSIS — E785 Hyperlipidemia, unspecified: Secondary | ICD-10-CM | POA: Diagnosis not present

## 2019-10-17 DIAGNOSIS — M21371 Foot drop, right foot: Secondary | ICD-10-CM | POA: Diagnosis not present

## 2019-10-17 DIAGNOSIS — K219 Gastro-esophageal reflux disease without esophagitis: Secondary | ICD-10-CM | POA: Diagnosis not present

## 2019-10-17 DIAGNOSIS — L89322 Pressure ulcer of left buttock, stage 2: Secondary | ICD-10-CM | POA: Diagnosis not present

## 2019-10-21 DIAGNOSIS — Z86010 Personal history of colonic polyps: Secondary | ICD-10-CM | POA: Diagnosis not present

## 2019-10-21 DIAGNOSIS — D692 Other nonthrombocytopenic purpura: Secondary | ICD-10-CM | POA: Diagnosis not present

## 2019-10-21 DIAGNOSIS — D649 Anemia, unspecified: Secondary | ICD-10-CM | POA: Diagnosis not present

## 2019-10-21 DIAGNOSIS — Z7984 Long term (current) use of oral hypoglycemic drugs: Secondary | ICD-10-CM | POA: Diagnosis not present

## 2019-10-21 DIAGNOSIS — E785 Hyperlipidemia, unspecified: Secondary | ICD-10-CM | POA: Diagnosis not present

## 2019-10-21 DIAGNOSIS — Z951 Presence of aortocoronary bypass graft: Secondary | ICD-10-CM | POA: Diagnosis not present

## 2019-10-21 DIAGNOSIS — Z9181 History of falling: Secondary | ICD-10-CM | POA: Diagnosis not present

## 2019-10-21 DIAGNOSIS — M25551 Pain in right hip: Secondary | ICD-10-CM | POA: Diagnosis not present

## 2019-10-21 DIAGNOSIS — R296 Repeated falls: Secondary | ICD-10-CM | POA: Diagnosis not present

## 2019-10-21 DIAGNOSIS — I2510 Atherosclerotic heart disease of native coronary artery without angina pectoris: Secondary | ICD-10-CM | POA: Diagnosis not present

## 2019-10-21 DIAGNOSIS — I252 Old myocardial infarction: Secondary | ICD-10-CM | POA: Diagnosis not present

## 2019-10-21 DIAGNOSIS — Z7982 Long term (current) use of aspirin: Secondary | ICD-10-CM | POA: Diagnosis not present

## 2019-10-21 DIAGNOSIS — E119 Type 2 diabetes mellitus without complications: Secondary | ICD-10-CM | POA: Diagnosis not present

## 2019-10-21 DIAGNOSIS — K219 Gastro-esophageal reflux disease without esophagitis: Secondary | ICD-10-CM | POA: Diagnosis not present

## 2019-10-21 DIAGNOSIS — M810 Age-related osteoporosis without current pathological fracture: Secondary | ICD-10-CM | POA: Diagnosis not present

## 2019-10-21 DIAGNOSIS — I6529 Occlusion and stenosis of unspecified carotid artery: Secondary | ICD-10-CM | POA: Diagnosis not present

## 2019-10-21 DIAGNOSIS — M21371 Foot drop, right foot: Secondary | ICD-10-CM | POA: Diagnosis not present

## 2019-10-21 DIAGNOSIS — I714 Abdominal aortic aneurysm, without rupture: Secondary | ICD-10-CM | POA: Diagnosis not present

## 2019-10-21 DIAGNOSIS — I10 Essential (primary) hypertension: Secondary | ICD-10-CM | POA: Diagnosis not present

## 2019-10-21 DIAGNOSIS — L89322 Pressure ulcer of left buttock, stage 2: Secondary | ICD-10-CM | POA: Diagnosis not present

## 2019-10-21 DIAGNOSIS — M5416 Radiculopathy, lumbar region: Secondary | ICD-10-CM | POA: Diagnosis not present

## 2019-10-23 DIAGNOSIS — D649 Anemia, unspecified: Secondary | ICD-10-CM | POA: Diagnosis not present

## 2019-10-23 DIAGNOSIS — Z7982 Long term (current) use of aspirin: Secondary | ICD-10-CM | POA: Diagnosis not present

## 2019-10-23 DIAGNOSIS — R296 Repeated falls: Secondary | ICD-10-CM | POA: Diagnosis not present

## 2019-10-23 DIAGNOSIS — Z86010 Personal history of colonic polyps: Secondary | ICD-10-CM | POA: Diagnosis not present

## 2019-10-23 DIAGNOSIS — I10 Essential (primary) hypertension: Secondary | ICD-10-CM | POA: Diagnosis not present

## 2019-10-23 DIAGNOSIS — M21371 Foot drop, right foot: Secondary | ICD-10-CM | POA: Diagnosis not present

## 2019-10-23 DIAGNOSIS — I714 Abdominal aortic aneurysm, without rupture: Secondary | ICD-10-CM | POA: Diagnosis not present

## 2019-10-23 DIAGNOSIS — E785 Hyperlipidemia, unspecified: Secondary | ICD-10-CM | POA: Diagnosis not present

## 2019-10-23 DIAGNOSIS — E119 Type 2 diabetes mellitus without complications: Secondary | ICD-10-CM | POA: Diagnosis not present

## 2019-10-23 DIAGNOSIS — M810 Age-related osteoporosis without current pathological fracture: Secondary | ICD-10-CM | POA: Diagnosis not present

## 2019-10-23 DIAGNOSIS — M25551 Pain in right hip: Secondary | ICD-10-CM | POA: Diagnosis not present

## 2019-10-23 DIAGNOSIS — Z951 Presence of aortocoronary bypass graft: Secondary | ICD-10-CM | POA: Diagnosis not present

## 2019-10-23 DIAGNOSIS — L89322 Pressure ulcer of left buttock, stage 2: Secondary | ICD-10-CM | POA: Diagnosis not present

## 2019-10-23 DIAGNOSIS — I252 Old myocardial infarction: Secondary | ICD-10-CM | POA: Diagnosis not present

## 2019-10-23 DIAGNOSIS — M5416 Radiculopathy, lumbar region: Secondary | ICD-10-CM | POA: Diagnosis not present

## 2019-10-23 DIAGNOSIS — K219 Gastro-esophageal reflux disease without esophagitis: Secondary | ICD-10-CM | POA: Diagnosis not present

## 2019-10-23 DIAGNOSIS — Z7984 Long term (current) use of oral hypoglycemic drugs: Secondary | ICD-10-CM | POA: Diagnosis not present

## 2019-10-23 DIAGNOSIS — I2510 Atherosclerotic heart disease of native coronary artery without angina pectoris: Secondary | ICD-10-CM | POA: Diagnosis not present

## 2019-10-23 DIAGNOSIS — I6529 Occlusion and stenosis of unspecified carotid artery: Secondary | ICD-10-CM | POA: Diagnosis not present

## 2019-10-23 DIAGNOSIS — Z9181 History of falling: Secondary | ICD-10-CM | POA: Diagnosis not present

## 2019-10-23 DIAGNOSIS — D692 Other nonthrombocytopenic purpura: Secondary | ICD-10-CM | POA: Diagnosis not present

## 2019-10-24 DIAGNOSIS — I2510 Atherosclerotic heart disease of native coronary artery without angina pectoris: Secondary | ICD-10-CM | POA: Diagnosis not present

## 2019-10-24 DIAGNOSIS — K219 Gastro-esophageal reflux disease without esophagitis: Secondary | ICD-10-CM | POA: Diagnosis not present

## 2019-10-24 DIAGNOSIS — I714 Abdominal aortic aneurysm, without rupture: Secondary | ICD-10-CM | POA: Diagnosis not present

## 2019-10-24 DIAGNOSIS — R296 Repeated falls: Secondary | ICD-10-CM | POA: Diagnosis not present

## 2019-10-24 DIAGNOSIS — M21371 Foot drop, right foot: Secondary | ICD-10-CM | POA: Diagnosis not present

## 2019-10-24 DIAGNOSIS — Z7982 Long term (current) use of aspirin: Secondary | ICD-10-CM | POA: Diagnosis not present

## 2019-10-24 DIAGNOSIS — Z86010 Personal history of colonic polyps: Secondary | ICD-10-CM | POA: Diagnosis not present

## 2019-10-24 DIAGNOSIS — E785 Hyperlipidemia, unspecified: Secondary | ICD-10-CM | POA: Diagnosis not present

## 2019-10-24 DIAGNOSIS — D692 Other nonthrombocytopenic purpura: Secondary | ICD-10-CM | POA: Diagnosis not present

## 2019-10-24 DIAGNOSIS — L89322 Pressure ulcer of left buttock, stage 2: Secondary | ICD-10-CM | POA: Diagnosis not present

## 2019-10-24 DIAGNOSIS — Z951 Presence of aortocoronary bypass graft: Secondary | ICD-10-CM | POA: Diagnosis not present

## 2019-10-24 DIAGNOSIS — Z9181 History of falling: Secondary | ICD-10-CM | POA: Diagnosis not present

## 2019-10-24 DIAGNOSIS — M810 Age-related osteoporosis without current pathological fracture: Secondary | ICD-10-CM | POA: Diagnosis not present

## 2019-10-24 DIAGNOSIS — D649 Anemia, unspecified: Secondary | ICD-10-CM | POA: Diagnosis not present

## 2019-10-24 DIAGNOSIS — Z7984 Long term (current) use of oral hypoglycemic drugs: Secondary | ICD-10-CM | POA: Diagnosis not present

## 2019-10-24 DIAGNOSIS — M25551 Pain in right hip: Secondary | ICD-10-CM | POA: Diagnosis not present

## 2019-10-24 DIAGNOSIS — M5416 Radiculopathy, lumbar region: Secondary | ICD-10-CM | POA: Diagnosis not present

## 2019-10-24 DIAGNOSIS — I252 Old myocardial infarction: Secondary | ICD-10-CM | POA: Diagnosis not present

## 2019-10-24 DIAGNOSIS — I10 Essential (primary) hypertension: Secondary | ICD-10-CM | POA: Diagnosis not present

## 2019-10-24 DIAGNOSIS — I6529 Occlusion and stenosis of unspecified carotid artery: Secondary | ICD-10-CM | POA: Diagnosis not present

## 2019-10-24 DIAGNOSIS — E119 Type 2 diabetes mellitus without complications: Secondary | ICD-10-CM | POA: Diagnosis not present

## 2019-10-27 DIAGNOSIS — Z86010 Personal history of colonic polyps: Secondary | ICD-10-CM | POA: Diagnosis not present

## 2019-10-27 DIAGNOSIS — I714 Abdominal aortic aneurysm, without rupture: Secondary | ICD-10-CM | POA: Diagnosis not present

## 2019-10-27 DIAGNOSIS — K219 Gastro-esophageal reflux disease without esophagitis: Secondary | ICD-10-CM | POA: Diagnosis not present

## 2019-10-27 DIAGNOSIS — Z951 Presence of aortocoronary bypass graft: Secondary | ICD-10-CM | POA: Diagnosis not present

## 2019-10-27 DIAGNOSIS — Z7982 Long term (current) use of aspirin: Secondary | ICD-10-CM | POA: Diagnosis not present

## 2019-10-27 DIAGNOSIS — I2510 Atherosclerotic heart disease of native coronary artery without angina pectoris: Secondary | ICD-10-CM | POA: Diagnosis not present

## 2019-10-27 DIAGNOSIS — M21371 Foot drop, right foot: Secondary | ICD-10-CM | POA: Diagnosis not present

## 2019-10-27 DIAGNOSIS — Z7984 Long term (current) use of oral hypoglycemic drugs: Secondary | ICD-10-CM | POA: Diagnosis not present

## 2019-10-27 DIAGNOSIS — D692 Other nonthrombocytopenic purpura: Secondary | ICD-10-CM | POA: Diagnosis not present

## 2019-10-27 DIAGNOSIS — I10 Essential (primary) hypertension: Secondary | ICD-10-CM | POA: Diagnosis not present

## 2019-10-27 DIAGNOSIS — E785 Hyperlipidemia, unspecified: Secondary | ICD-10-CM | POA: Diagnosis not present

## 2019-10-27 DIAGNOSIS — D649 Anemia, unspecified: Secondary | ICD-10-CM | POA: Diagnosis not present

## 2019-10-27 DIAGNOSIS — E119 Type 2 diabetes mellitus without complications: Secondary | ICD-10-CM | POA: Diagnosis not present

## 2019-10-27 DIAGNOSIS — L89322 Pressure ulcer of left buttock, stage 2: Secondary | ICD-10-CM | POA: Diagnosis not present

## 2019-10-27 DIAGNOSIS — I252 Old myocardial infarction: Secondary | ICD-10-CM | POA: Diagnosis not present

## 2019-10-27 DIAGNOSIS — I6529 Occlusion and stenosis of unspecified carotid artery: Secondary | ICD-10-CM | POA: Diagnosis not present

## 2019-10-27 DIAGNOSIS — M5416 Radiculopathy, lumbar region: Secondary | ICD-10-CM | POA: Diagnosis not present

## 2019-10-27 DIAGNOSIS — M810 Age-related osteoporosis without current pathological fracture: Secondary | ICD-10-CM | POA: Diagnosis not present

## 2019-10-27 DIAGNOSIS — M25551 Pain in right hip: Secondary | ICD-10-CM | POA: Diagnosis not present

## 2019-10-27 DIAGNOSIS — R296 Repeated falls: Secondary | ICD-10-CM | POA: Diagnosis not present

## 2019-10-27 DIAGNOSIS — Z9181 History of falling: Secondary | ICD-10-CM | POA: Diagnosis not present

## 2019-10-30 ENCOUNTER — Telehealth: Payer: Self-pay | Admitting: Family Medicine

## 2019-10-30 DIAGNOSIS — Z7984 Long term (current) use of oral hypoglycemic drugs: Secondary | ICD-10-CM | POA: Diagnosis not present

## 2019-10-30 DIAGNOSIS — D692 Other nonthrombocytopenic purpura: Secondary | ICD-10-CM | POA: Diagnosis not present

## 2019-10-30 DIAGNOSIS — I252 Old myocardial infarction: Secondary | ICD-10-CM | POA: Diagnosis not present

## 2019-10-30 DIAGNOSIS — M810 Age-related osteoporosis without current pathological fracture: Secondary | ICD-10-CM | POA: Diagnosis not present

## 2019-10-30 DIAGNOSIS — K219 Gastro-esophageal reflux disease without esophagitis: Secondary | ICD-10-CM | POA: Diagnosis not present

## 2019-10-30 DIAGNOSIS — Z7982 Long term (current) use of aspirin: Secondary | ICD-10-CM | POA: Diagnosis not present

## 2019-10-30 DIAGNOSIS — M5416 Radiculopathy, lumbar region: Secondary | ICD-10-CM | POA: Diagnosis not present

## 2019-10-30 DIAGNOSIS — L89322 Pressure ulcer of left buttock, stage 2: Secondary | ICD-10-CM | POA: Diagnosis not present

## 2019-10-30 DIAGNOSIS — I6529 Occlusion and stenosis of unspecified carotid artery: Secondary | ICD-10-CM | POA: Diagnosis not present

## 2019-10-30 DIAGNOSIS — M21371 Foot drop, right foot: Secondary | ICD-10-CM | POA: Diagnosis not present

## 2019-10-30 DIAGNOSIS — D649 Anemia, unspecified: Secondary | ICD-10-CM | POA: Diagnosis not present

## 2019-10-30 DIAGNOSIS — R296 Repeated falls: Secondary | ICD-10-CM | POA: Diagnosis not present

## 2019-10-30 DIAGNOSIS — M25551 Pain in right hip: Secondary | ICD-10-CM | POA: Diagnosis not present

## 2019-10-30 DIAGNOSIS — I2510 Atherosclerotic heart disease of native coronary artery without angina pectoris: Secondary | ICD-10-CM | POA: Diagnosis not present

## 2019-10-30 DIAGNOSIS — I714 Abdominal aortic aneurysm, without rupture: Secondary | ICD-10-CM | POA: Diagnosis not present

## 2019-10-30 DIAGNOSIS — Z951 Presence of aortocoronary bypass graft: Secondary | ICD-10-CM | POA: Diagnosis not present

## 2019-10-30 DIAGNOSIS — Z9181 History of falling: Secondary | ICD-10-CM | POA: Diagnosis not present

## 2019-10-30 DIAGNOSIS — E785 Hyperlipidemia, unspecified: Secondary | ICD-10-CM | POA: Diagnosis not present

## 2019-10-30 DIAGNOSIS — E119 Type 2 diabetes mellitus without complications: Secondary | ICD-10-CM | POA: Diagnosis not present

## 2019-10-30 DIAGNOSIS — Z86010 Personal history of colonic polyps: Secondary | ICD-10-CM | POA: Diagnosis not present

## 2019-10-30 DIAGNOSIS — I10 Essential (primary) hypertension: Secondary | ICD-10-CM | POA: Diagnosis not present

## 2019-10-30 NOTE — Telephone Encounter (Signed)
Called pt and spoke to pt's caregiver Steven [name], told her I tried to contact pt's daughter but number incorrect. Told her pt needs an appt to evaluate for possible UTI or go to Urgent care. Asked her if she can bring pt for an appt tomorrow? Steven [name] asked when? Told her I have an appt at 10:00 or 1:00? Steven [name] said 1:00. Told her okay appt scheduled for 1:00 PM tomorrow with Dr. Rogers Blocker. Steven [name] verbalized understanding.

## 2019-10-30 NOTE — Telephone Encounter (Signed)
Tried to call [name] twice was told number is incorrect.

## 2019-10-30 NOTE — Telephone Encounter (Signed)
Pt's daughter said the home health nurse told her they think pt has a UTI. His urine smells and he is having frequent urination.   She also said that the physical therapist tod her they need to consider having neurological testing done on pt.  [name] is wanting a call back.

## 2019-10-31 ENCOUNTER — Telehealth: Payer: Medicare Other | Admitting: Family Medicine

## 2019-10-31 ENCOUNTER — Ambulatory Visit: Payer: Medicare Other | Admitting: Family Medicine

## 2019-11-05 DIAGNOSIS — M5416 Radiculopathy, lumbar region: Secondary | ICD-10-CM | POA: Diagnosis not present

## 2019-11-05 DIAGNOSIS — Z9181 History of falling: Secondary | ICD-10-CM | POA: Diagnosis not present

## 2019-11-05 DIAGNOSIS — Z7984 Long term (current) use of oral hypoglycemic drugs: Secondary | ICD-10-CM | POA: Diagnosis not present

## 2019-11-05 DIAGNOSIS — Z951 Presence of aortocoronary bypass graft: Secondary | ICD-10-CM | POA: Diagnosis not present

## 2019-11-05 DIAGNOSIS — M810 Age-related osteoporosis without current pathological fracture: Secondary | ICD-10-CM | POA: Diagnosis not present

## 2019-11-05 DIAGNOSIS — I252 Old myocardial infarction: Secondary | ICD-10-CM | POA: Diagnosis not present

## 2019-11-05 DIAGNOSIS — R296 Repeated falls: Secondary | ICD-10-CM | POA: Diagnosis not present

## 2019-11-05 DIAGNOSIS — K219 Gastro-esophageal reflux disease without esophagitis: Secondary | ICD-10-CM | POA: Diagnosis not present

## 2019-11-05 DIAGNOSIS — Z86010 Personal history of colonic polyps: Secondary | ICD-10-CM | POA: Diagnosis not present

## 2019-11-05 DIAGNOSIS — L89322 Pressure ulcer of left buttock, stage 2: Secondary | ICD-10-CM | POA: Diagnosis not present

## 2019-11-05 DIAGNOSIS — E119 Type 2 diabetes mellitus without complications: Secondary | ICD-10-CM | POA: Diagnosis not present

## 2019-11-05 DIAGNOSIS — D649 Anemia, unspecified: Secondary | ICD-10-CM | POA: Diagnosis not present

## 2019-11-05 DIAGNOSIS — I2510 Atherosclerotic heart disease of native coronary artery without angina pectoris: Secondary | ICD-10-CM | POA: Diagnosis not present

## 2019-11-05 DIAGNOSIS — I6529 Occlusion and stenosis of unspecified carotid artery: Secondary | ICD-10-CM | POA: Diagnosis not present

## 2019-11-05 DIAGNOSIS — M21371 Foot drop, right foot: Secondary | ICD-10-CM | POA: Diagnosis not present

## 2019-11-05 DIAGNOSIS — M25551 Pain in right hip: Secondary | ICD-10-CM | POA: Diagnosis not present

## 2019-11-05 DIAGNOSIS — Z7982 Long term (current) use of aspirin: Secondary | ICD-10-CM | POA: Diagnosis not present

## 2019-11-05 DIAGNOSIS — I714 Abdominal aortic aneurysm, without rupture: Secondary | ICD-10-CM | POA: Diagnosis not present

## 2019-11-05 DIAGNOSIS — E785 Hyperlipidemia, unspecified: Secondary | ICD-10-CM | POA: Diagnosis not present

## 2019-11-05 DIAGNOSIS — D692 Other nonthrombocytopenic purpura: Secondary | ICD-10-CM | POA: Diagnosis not present

## 2019-11-05 DIAGNOSIS — I10 Essential (primary) hypertension: Secondary | ICD-10-CM | POA: Diagnosis not present

## 2019-11-06 DIAGNOSIS — L89322 Pressure ulcer of left buttock, stage 2: Secondary | ICD-10-CM | POA: Diagnosis not present

## 2019-11-06 DIAGNOSIS — Z86010 Personal history of colonic polyps: Secondary | ICD-10-CM | POA: Diagnosis not present

## 2019-11-06 DIAGNOSIS — D692 Other nonthrombocytopenic purpura: Secondary | ICD-10-CM | POA: Diagnosis not present

## 2019-11-06 DIAGNOSIS — K219 Gastro-esophageal reflux disease without esophagitis: Secondary | ICD-10-CM | POA: Diagnosis not present

## 2019-11-06 DIAGNOSIS — E119 Type 2 diabetes mellitus without complications: Secondary | ICD-10-CM | POA: Diagnosis not present

## 2019-11-06 DIAGNOSIS — M810 Age-related osteoporosis without current pathological fracture: Secondary | ICD-10-CM | POA: Diagnosis not present

## 2019-11-06 DIAGNOSIS — I2510 Atherosclerotic heart disease of native coronary artery without angina pectoris: Secondary | ICD-10-CM | POA: Diagnosis not present

## 2019-11-06 DIAGNOSIS — I10 Essential (primary) hypertension: Secondary | ICD-10-CM | POA: Diagnosis not present

## 2019-11-06 DIAGNOSIS — M25551 Pain in right hip: Secondary | ICD-10-CM | POA: Diagnosis not present

## 2019-11-06 DIAGNOSIS — I714 Abdominal aortic aneurysm, without rupture: Secondary | ICD-10-CM | POA: Diagnosis not present

## 2019-11-06 DIAGNOSIS — Z7984 Long term (current) use of oral hypoglycemic drugs: Secondary | ICD-10-CM | POA: Diagnosis not present

## 2019-11-06 DIAGNOSIS — M5416 Radiculopathy, lumbar region: Secondary | ICD-10-CM | POA: Diagnosis not present

## 2019-11-06 DIAGNOSIS — I6529 Occlusion and stenosis of unspecified carotid artery: Secondary | ICD-10-CM | POA: Diagnosis not present

## 2019-11-06 DIAGNOSIS — M21371 Foot drop, right foot: Secondary | ICD-10-CM | POA: Diagnosis not present

## 2019-11-06 DIAGNOSIS — R296 Repeated falls: Secondary | ICD-10-CM | POA: Diagnosis not present

## 2019-11-06 DIAGNOSIS — Z7982 Long term (current) use of aspirin: Secondary | ICD-10-CM | POA: Diagnosis not present

## 2019-11-06 DIAGNOSIS — Z9181 History of falling: Secondary | ICD-10-CM | POA: Diagnosis not present

## 2019-11-06 DIAGNOSIS — Z951 Presence of aortocoronary bypass graft: Secondary | ICD-10-CM | POA: Diagnosis not present

## 2019-11-06 DIAGNOSIS — D649 Anemia, unspecified: Secondary | ICD-10-CM | POA: Diagnosis not present

## 2019-11-06 DIAGNOSIS — E785 Hyperlipidemia, unspecified: Secondary | ICD-10-CM | POA: Diagnosis not present

## 2019-11-06 DIAGNOSIS — I252 Old myocardial infarction: Secondary | ICD-10-CM | POA: Diagnosis not present

## 2019-11-11 DIAGNOSIS — I714 Abdominal aortic aneurysm, without rupture: Secondary | ICD-10-CM | POA: Diagnosis not present

## 2019-11-11 DIAGNOSIS — E785 Hyperlipidemia, unspecified: Secondary | ICD-10-CM | POA: Diagnosis not present

## 2019-11-11 DIAGNOSIS — D649 Anemia, unspecified: Secondary | ICD-10-CM | POA: Diagnosis not present

## 2019-11-11 DIAGNOSIS — Z7982 Long term (current) use of aspirin: Secondary | ICD-10-CM | POA: Diagnosis not present

## 2019-11-11 DIAGNOSIS — I6529 Occlusion and stenosis of unspecified carotid artery: Secondary | ICD-10-CM | POA: Diagnosis not present

## 2019-11-11 DIAGNOSIS — Z951 Presence of aortocoronary bypass graft: Secondary | ICD-10-CM | POA: Diagnosis not present

## 2019-11-11 DIAGNOSIS — I2510 Atherosclerotic heart disease of native coronary artery without angina pectoris: Secondary | ICD-10-CM | POA: Diagnosis not present

## 2019-11-11 DIAGNOSIS — M21371 Foot drop, right foot: Secondary | ICD-10-CM | POA: Diagnosis not present

## 2019-11-11 DIAGNOSIS — I10 Essential (primary) hypertension: Secondary | ICD-10-CM | POA: Diagnosis not present

## 2019-11-11 DIAGNOSIS — L89322 Pressure ulcer of left buttock, stage 2: Secondary | ICD-10-CM | POA: Diagnosis not present

## 2019-11-11 DIAGNOSIS — D692 Other nonthrombocytopenic purpura: Secondary | ICD-10-CM | POA: Diagnosis not present

## 2019-11-11 DIAGNOSIS — Z7984 Long term (current) use of oral hypoglycemic drugs: Secondary | ICD-10-CM | POA: Diagnosis not present

## 2019-11-11 DIAGNOSIS — Z86010 Personal history of colonic polyps: Secondary | ICD-10-CM | POA: Diagnosis not present

## 2019-11-11 DIAGNOSIS — E119 Type 2 diabetes mellitus without complications: Secondary | ICD-10-CM | POA: Diagnosis not present

## 2019-11-11 DIAGNOSIS — R296 Repeated falls: Secondary | ICD-10-CM | POA: Diagnosis not present

## 2019-11-11 DIAGNOSIS — Z9181 History of falling: Secondary | ICD-10-CM | POA: Diagnosis not present

## 2019-11-11 DIAGNOSIS — M25551 Pain in right hip: Secondary | ICD-10-CM | POA: Diagnosis not present

## 2019-11-11 DIAGNOSIS — I252 Old myocardial infarction: Secondary | ICD-10-CM | POA: Diagnosis not present

## 2019-11-11 DIAGNOSIS — K219 Gastro-esophageal reflux disease without esophagitis: Secondary | ICD-10-CM | POA: Diagnosis not present

## 2019-11-11 DIAGNOSIS — M810 Age-related osteoporosis without current pathological fracture: Secondary | ICD-10-CM | POA: Diagnosis not present

## 2019-11-11 DIAGNOSIS — M5416 Radiculopathy, lumbar region: Secondary | ICD-10-CM | POA: Diagnosis not present

## 2019-11-12 DIAGNOSIS — E785 Hyperlipidemia, unspecified: Secondary | ICD-10-CM | POA: Diagnosis not present

## 2019-11-12 DIAGNOSIS — M810 Age-related osteoporosis without current pathological fracture: Secondary | ICD-10-CM | POA: Diagnosis not present

## 2019-11-12 DIAGNOSIS — I6529 Occlusion and stenosis of unspecified carotid artery: Secondary | ICD-10-CM | POA: Diagnosis not present

## 2019-11-12 DIAGNOSIS — I252 Old myocardial infarction: Secondary | ICD-10-CM | POA: Diagnosis not present

## 2019-11-12 DIAGNOSIS — R296 Repeated falls: Secondary | ICD-10-CM | POA: Diagnosis not present

## 2019-11-12 DIAGNOSIS — Z951 Presence of aortocoronary bypass graft: Secondary | ICD-10-CM | POA: Diagnosis not present

## 2019-11-12 DIAGNOSIS — I2510 Atherosclerotic heart disease of native coronary artery without angina pectoris: Secondary | ICD-10-CM | POA: Diagnosis not present

## 2019-11-12 DIAGNOSIS — I10 Essential (primary) hypertension: Secondary | ICD-10-CM | POA: Diagnosis not present

## 2019-11-12 DIAGNOSIS — D692 Other nonthrombocytopenic purpura: Secondary | ICD-10-CM | POA: Diagnosis not present

## 2019-11-12 DIAGNOSIS — I714 Abdominal aortic aneurysm, without rupture: Secondary | ICD-10-CM | POA: Diagnosis not present

## 2019-11-12 DIAGNOSIS — D649 Anemia, unspecified: Secondary | ICD-10-CM | POA: Diagnosis not present

## 2019-11-12 DIAGNOSIS — E119 Type 2 diabetes mellitus without complications: Secondary | ICD-10-CM | POA: Diagnosis not present

## 2019-11-12 DIAGNOSIS — Z7982 Long term (current) use of aspirin: Secondary | ICD-10-CM | POA: Diagnosis not present

## 2019-11-12 DIAGNOSIS — Z86010 Personal history of colonic polyps: Secondary | ICD-10-CM | POA: Diagnosis not present

## 2019-11-12 DIAGNOSIS — M21371 Foot drop, right foot: Secondary | ICD-10-CM | POA: Diagnosis not present

## 2019-11-12 DIAGNOSIS — Z9181 History of falling: Secondary | ICD-10-CM | POA: Diagnosis not present

## 2019-11-12 DIAGNOSIS — M5416 Radiculopathy, lumbar region: Secondary | ICD-10-CM | POA: Diagnosis not present

## 2019-11-12 DIAGNOSIS — K219 Gastro-esophageal reflux disease without esophagitis: Secondary | ICD-10-CM | POA: Diagnosis not present

## 2019-11-12 DIAGNOSIS — L89322 Pressure ulcer of left buttock, stage 2: Secondary | ICD-10-CM | POA: Diagnosis not present

## 2019-11-12 DIAGNOSIS — Z7984 Long term (current) use of oral hypoglycemic drugs: Secondary | ICD-10-CM | POA: Diagnosis not present

## 2019-11-12 DIAGNOSIS — M25551 Pain in right hip: Secondary | ICD-10-CM | POA: Diagnosis not present

## 2019-11-18 ENCOUNTER — Encounter: Payer: Self-pay | Admitting: Family Medicine

## 2019-11-18 ENCOUNTER — Ambulatory Visit (INDEPENDENT_AMBULATORY_CARE_PROVIDER_SITE_OTHER): Payer: Medicare Other | Admitting: Family Medicine

## 2019-11-18 ENCOUNTER — Other Ambulatory Visit: Payer: Self-pay

## 2019-11-18 VITALS — BP 112/64 | HR 106 | Temp 98.0°F | Ht 65.0 in | Wt 150.0 lb

## 2019-11-18 DIAGNOSIS — R8290 Unspecified abnormal findings in urine: Secondary | ICD-10-CM

## 2019-11-18 DIAGNOSIS — I25810 Atherosclerosis of coronary artery bypass graft(s) without angina pectoris: Secondary | ICD-10-CM

## 2019-11-18 DIAGNOSIS — R350 Frequency of micturition: Secondary | ICD-10-CM | POA: Diagnosis not present

## 2019-11-18 DIAGNOSIS — R351 Nocturia: Secondary | ICD-10-CM

## 2019-11-18 DIAGNOSIS — E119 Type 2 diabetes mellitus without complications: Secondary | ICD-10-CM | POA: Diagnosis not present

## 2019-11-18 DIAGNOSIS — E782 Mixed hyperlipidemia: Secondary | ICD-10-CM | POA: Diagnosis not present

## 2019-11-18 DIAGNOSIS — I10 Essential (primary) hypertension: Secondary | ICD-10-CM

## 2019-11-18 DIAGNOSIS — D649 Anemia, unspecified: Secondary | ICD-10-CM

## 2019-11-18 LAB — POC URINALSYSI DIPSTICK (AUTOMATED)
Bilirubin, UA: NEGATIVE
Blood, UA: NEGATIVE
Glucose, UA: NEGATIVE
Ketones, UA: NEGATIVE
Leukocytes, UA: NEGATIVE
Protein, UA: NEGATIVE
Spec Grav, UA: 1.025 (ref 1.010–1.025)
Urobilinogen, UA: 0.2 E.U./dL
pH, UA: 5 (ref 5.0–8.0)

## 2019-11-18 NOTE — Patient Instructions (Addendum)
Please stop by lab before you go If you have mychart- we will send your results within 3 business days of [place] receiving them.  If you do not have mychart- we will call you about results within 5 business days of [place] receiving them.  *please note we are currently using [facility] which has a longer processing time than [facility] typically so labs may not come back as quickly as in the past *please also note that you will see labs on mychart as soon as they post. I will later go in and write notes on them- will say "notes from Dr. Yong Channel"  Monitor heart rate at home, let us know if it remains over 100 so we can follow up with Cardiology.  Will await urine culture results.

## 2019-11-18 NOTE — Progress Notes (Signed)
Phone [phone] In person visit   Subjective:   Steven [name] is a 86 y.o. year old very pleasant male patient who presents for/with See problem oriented charting  This visit occurred during the SARS-CoV-2 public health emergency.  Safety protocols were in place, including screening questions prior to the visit, additional usage of staff PPE, and extensive cleaning of exam room while observing appropriate contact time as indicated for disinfecting solutions.   Past Medical History-  Patient Active Problem List   Diagnosis Date Noted  . AAA (abdominal aortic aneurysm) ([place]) 12/13/2018    Priority: High  . Falls frequently 08/21/2017    Priority: High  . Aortic root dilatation ([place]) 10/06/2016    Priority: High  . Osteoporosis 01/07/2015    Priority: High  . CAD, ARTERY BYPASS GRAFT 05/21/2009    Priority: High  . Carotid artery stenosis 04/20/2008    Priority: High  . Diabetes mellitus type II, controlled ([place]) 04/19/2007    Priority: High  . BPH (benign prostatic hyperplasia) 09/18/2019    Priority: Medium  . Anemia 09/03/2013    Priority: Medium  . Essential hypertension, benign 10/11/2012    Priority: Medium  . Hyperlipidemia 04/19/2007    Priority: Medium  . Seborrheic dermatitis 09/13/2017    Priority: Low  . Senile purpura ([place]) 08/21/2017    Priority: Low  . History of acute inferior wall MI 09/03/2015    Priority: Low  . Foot drop, left 08/23/2012    Priority: Low  . History of supraventricular tachycardia 01/25/2011    Priority: Low  . Hypogonadism in male 02/04/2010    Priority: Low  . History of colonic polyps 09/07/2009    Priority: Low  . GERD 04/19/2007    Priority: Low  . Hx of CABG 12/13/2017  . PVC's (premature ventricular contractions) 12/13/2017    Medications- reviewed and updated Current Outpatient Medications  Medication Sig Dispense Refill  . acetaminophen (TYLENOL) 500 MG tablet Take 500 mg by mouth every 6 (six) hours as needed.  Up to 6 tabs (3gm) qd    . alendronate (FOSAMAX) 70 MG tablet TAKE 1 TABLET BY MOUTH EVERY 7 DAYS WITH A FULL GLASS OF WATER ON AN EMPTY STOMACH 12 tablet 4  . aspirin 81 MG tablet Take 81 mg by mouth daily.    Marland Kitchen atorvastatin (LIPITOR) 80 MG tablet TAKE 1 TABLET DAILY. 90 tablet 3  . cycloSPORINE (RESTASIS) 0.05 % ophthalmic emulsion 1 drop 2 (two) times daily.    . diclofenac sodium (VOLTAREN) 1 % GEL Apply 4 g topically 4 (four) times daily. (Patient taking differently: Apply 4 g topically in the morning and at bedtime. ) 100 g 1  . diltiazem (MATZIM LA) 180 MG 24 hr tablet Take 1 tablet (180 mg total) by mouth daily. 90 tablet 1  . glimepiride (AMARYL) 1 MG tablet Take 1 tablet (1 mg total) by mouth daily with breakfast. 90 tablet 3  . glucose blood test strip Use to test your blood sugar daily. E11.9 One touch Verio test strips 100 each 12  . hydrocortisone 2.5 % cream APPLICATIONS APPLY ON THE SKIN EVERY DAY    . Lancets (ONETOUCH ULTRASOFT) lancets USE TO TEST BLOOD SUGARS DAILY. 100 each 3  . metFORMIN (GLUCOPHAGE) 1000 MG tablet TAKE 1 TABLET (1,000 MG TOTAL) BY MOUTH 2 (TWO) TIMES DAILY WITH A MEAL. 180 tablet 3  . metoprolol succinate (TOPROL-XL) 50 MG 24 hr tablet TAKE 1 TABLET BY MOUTH EVERY DAY 90 tablet  1  . omeprazole (PRILOSEC) 20 MG capsule Take 1 capsule (20 mg total) by mouth daily. (Patient taking differently: Take 20 mg by mouth as needed. ) 90 capsule 3  . ONETOUCH VERIO test strip USE TO TEST BLOOD SUGARS DAILY. 100 each 3  . traMADol (ULTRAM) 50 MG tablet Take 1 tablet (50 mg total) by mouth every 8 (eight) hours as needed. 60 tablet 1   No current facility-administered medications for this visit.     Objective:  BP 112/64   Pulse (!) 106   Temp 98 F (36.7 C)   Ht 5\' 5"  (1.651 m)   Wt 150 lb (68 kg)   SpO2 98%   BMI 24.96 kg/m  Gen: NAD, resting comfortably CV: Irregularly irregular-declines EKG Lungs: CTAB no crackles, wheeze, rhonchi Abdomen:  soft/nontender/nondistended/normal bowel sounds.  Ext: no edema Skin: warm, dry Neuro: In wheelchair  Results for orders placed or performed in visit on 11/18/19 (from the past 24 hour(s))  POCT Urinalysis Dipstick (Automated)     Status: None   Collection Time: 11/18/19  3:52 PM  Result Value Ref Range   Color, UA yellow    Clarity, UA clear    Glucose, UA Negative Negative   Bilirubin, UA neg    Ketones, UA neg    Spec Grav, UA 1.025 1.010 - 1.025   Blood, UA neg    pH, UA 5.0 5.0 - 8.0   Protein, UA Negative Negative   Urobilinogen, UA 0.2 0.2 or 1.0 E.U./dL   Nitrite, UA nge    Leukocytes, UA Negative Negative       Assessment and Plan  #Concern for UTI S: Patients symptoms started about 2 months ago (recent virtual visit).  Complains of dysuria no:  polyuria: yes; nocturia: yes (up to 6 x a night increased over last few months- worse sine fall few months ago) ; urgency: yes.  Symptoms are cloudy urine with odor and frequent urination.  ROS- no fever, chills, nausea, vomiting, flank pain. No blood in urine.  A/P: UA not concerning. Possible UTI. Will get urine culture. Empiric treatment will hold off.  With increase in nocturia over the last few months-we also opted to check a PSA-discussed possibility of prostate cancer-even though patient may not be a great candidate for treatment he would at least like to know if present and know what the options would be Lab Results  Component Value Date   PSA 1.01 02/03/2013   PSA 1.61 05/09/2010   PSA 1.11 01/18/2010  Patient to follow up if new or worsening symptoms or failure to improve.   #Tachycardia-patient reports taking his metoprolol as well as diltiazem.  We discussed getting an EKG today-he declines this due to challenges getting on the table.  He states his heart rate has been checked by 2 nurses and physical therapist twice a week and has been normal-he agrees if remains elevated to call cardiology for follow-up.   Discussed possibility of atrial fibrillation but he also has a history of PVCs  #Anemia history -patient is on Slow Fe.  Apparently anemic since the 1960s.  No recent ferritin checks so we opted to update this.  Very mild anemia but stable.  Patient also reports having darker stools since submitting stool samples earlier this year for diarrhea-diarrhea is much better.  Did have elevated inflammatory marker in the stool-has GI follow-up planned in next 3 days.  If levels are stable and since diarrhea has resolved I would not feel strongly  about colonoscopy but obviously would defer to their expert opinion Lab Results  Component Value Date   FERRITIN 48.4 09/03/2013    Lab Results  Component Value Date   WBC 9.2 08/05/2019   HGB 12.3 (L) 08/05/2019   HCT 36.9 (L) 08/05/2019   MCV 94.9 08/05/2019   PLT 291.0 08/05/2019   # Diabetes S: Previously controlled on Amaryl 1mg , metformin 1000 mg twice a day  Lab Results  Component Value Date   HGBA1C 6.5 08/05/2019   HGBA1C 6.5 11/28/2018   HGBA1C 5.9 02/26/2018   A/P: Controlled on last check-update A1c today  #Atherosclerosis of coronary artery bypass graft of native heart without angina pectoris S: Patient follows regularly with Dr. Debara Pickett.  He is compliant with aspirin, statin, metoprolol.  CABG in 2004.  Today reports no chest pain, shortness of breath or palpitations despite high heart rate A/P: Asymptomatic.  Appears stable-continue current medications  Mixed hyperlipidemia S: Patient is compliant with atorvastatin 80 mg.  LDL goal under 70 Lab Results  Component Value Date   CHOL 130 08/05/2019   HDL 41.00 08/05/2019   LDLCALC 69 08/05/2019   LDLDIRECT 74.0 02/26/2018   TRIG 101.0 08/05/2019   CHOLHDL 3 08/05/2019   A/P: Well-controlled last check-continue current medications  Essential hypertension, benign S: Compliant with metoprolol 50 mg extended release, diltiazem 180 mg extended release A/P: Blood pressure is well  controlled-once again interesting that the heart rate is as high as it is-patient declined EKG but agrees to cardiology follow-up if persistently high  #Osteoporosis S: Patient is compliant with Fosamax, calcium, vitamin D A/P: Hopefully stable-likely repeat DEXA at next visit  Recommended follow up: Return in about 4 months (around 03/19/2020) for CPE. Future Appointments  Date Time Provider [place]  11/21/2019 11:00 AM Irene Shipper, MD [facility]  03/23/2020  3:00 PM Marin Olp, MD [facility]  06/24/2020  3:00 PM Marin Olp, MD [facility]    Lab/Order associations:   ICD-10-CM   1. Frequent urination  R35.0 Urine Culture    POCT Urinalysis Dipstick (Automated)    Urine Culture  2. Controlled type 2 diabetes mellitus without complication, without long-term current use of insulin (HCC)  E11.9 Urine Microalbumin w/creat. ratio    Urine Microalbumin w/creat. ratio    CBC    Comprehensive metabolic panel    Hemoglobin A1c    Hemoglobin A1c    Comprehensive metabolic panel    CBC    CANCELED: Urine Microalbumin w/creat. ratio    CANCELED: Urine Microalbumin w/creat. ratio    CANCELED: Hemoglobin A1c    CANCELED: CBC    CANCELED: Comprehensive metabolic panel  3. Atherosclerosis of coronary artery bypass graft of native heart without angina pectoris  I25.810   4. Mixed hyperlipidemia  E78.2   5. Essential hypertension, benign  I10   6. Anemia, unspecified type  D64.9   7. Cloudy urine  R82.90 Urine Culture    POCT Urinalysis Dipstick (Automated)    Urine Culture  8. Screening for prostate cancer  Z12.5 CANCELED: PSA  9. Nocturia  R35.1 PSA    PSA    Return precautions advised.  [name], MD

## 2019-11-19 LAB — COMPREHENSIVE METABOLIC PANEL
AG Ratio: 1.3 (calc) (ref 1.0–2.5)
ALT: 12 U/L (ref 9–46)
AST: 13 U/L (ref 10–35)
Albumin: 3.7 g/dL (ref 3.6–5.1)
Alkaline phosphatase (APISO): 99 U/L (ref 35–144)
BUN: 16 mg/dL (ref 7–25)
CO2: 21 mmol/L (ref 20–32)
Calcium: 9.5 mg/dL (ref 8.6–10.3)
Chloride: 102 mmol/L (ref 98–110)
Creat: 0.99 mg/dL (ref 0.70–1.11)
Globulin: 2.8 g/dL (calc) (ref 1.9–3.7)
Glucose, Bld: 172 mg/dL — ABNORMAL HIGH (ref 65–99)
Potassium: 4.4 mmol/L (ref 3.5–5.3)
Sodium: 135 mmol/L (ref 135–146)
Total Bilirubin: 0.5 mg/dL (ref 0.2–1.2)
Total Protein: 6.5 g/dL (ref 6.1–8.1)

## 2019-11-19 LAB — MICROALBUMIN / CREATININE URINE RATIO
Creatinine, Urine: 89 mg/dL (ref 20–320)
Microalb Creat Ratio: 73 mcg/mg creat — ABNORMAL HIGH (ref <30)
Microalb, Ur: 6.5 mg/dL

## 2019-11-19 LAB — HEMOGLOBIN A1C
Hgb A1c MFr Bld: 6.3 % of total Hgb — ABNORMAL HIGH (ref <5.7)
Mean Plasma Glucose: 134 (calc)
eAG (mmol/L): 7.4 (calc)

## 2019-11-19 LAB — CBC
HCT: 39.7 % (ref 38.5–50.0)
Hemoglobin: 13.3 g/dL (ref 13.2–17.1)
MCH: 32 pg (ref 27.0–33.0)
MCHC: 33.5 g/dL (ref 32.0–36.0)
MCV: 95.4 fL (ref 80.0–100.0)
MPV: 9.4 fL (ref 7.5–12.5)
Platelets: 292 10*3/uL (ref 140–400)
RBC: 4.16 10*6/uL — ABNORMAL LOW (ref 4.20–5.80)
RDW: 13 % (ref 11.0–15.0)
WBC: 9.4 10*3/uL (ref 3.8–10.8)

## 2019-11-19 LAB — PSA: PSA: 1.4 ng/mL (ref <=4.0)

## 2019-11-20 LAB — URINE CULTURE
MICRO NUMBER:: 10781561
SPECIMEN QUALITY:: ADEQUATE

## 2019-11-21 ENCOUNTER — Encounter: Payer: Self-pay | Admitting: Internal Medicine

## 2019-11-21 ENCOUNTER — Other Ambulatory Visit: Payer: Self-pay

## 2019-11-21 ENCOUNTER — Ambulatory Visit (INDEPENDENT_AMBULATORY_CARE_PROVIDER_SITE_OTHER): Payer: Medicare Other | Admitting: Internal Medicine

## 2019-11-21 VITALS — BP 128/70 | HR 72 | Ht 65.0 in | Wt 155.0 lb

## 2019-11-21 DIAGNOSIS — R194 Change in bowel habit: Secondary | ICD-10-CM

## 2019-11-21 MED ORDER — LISINOPRIL 2.5 MG PO TABS
ORAL_TABLET | ORAL | 3 refills | Status: DC
Start: 2019-11-21 — End: 2021-08-30

## 2019-11-21 NOTE — Progress Notes (Signed)
HISTORY OF PRESENT ILLNESS:  Steven [name] is a 86 y.o. male who was sent today by his primary care provider Dr. Yong [name] regarding "diarrhea".  Patient is accompanied by his daughter Steven [name].  Patient was last seen in this office April 2011 when he underwent both colonoscopy and upper endoscopy.  Complete colonoscopy including intubation of the terminal ileum revealed moderate sigmoid diverticulosis, internal hemorrhoids, and diminutive sigmoid colon polyp.  No other abnormalities.  Upper endoscopy revealed gastric polyps and nonerosive duodenitis.  Patient has cognitive defects which affect his ability to provide an accurate history.  His daughter, though pleasant, adds little in the way of history.  In any event, I have reviewed records from emergency room at [facility] including x-rays and laboratories.  In addition telehealth visit with patient primary provider Dr. Yong [name], September 18, 2019, as well as laboratories and stool studies thereafter.  As best I can tell he presented to the emergency room on or around Sep 15, 2019 for evaluation of left-sided abdominal pain and diarrhea.  CT scan was unremarkable with regards to those complaints.  Laboratories were unremarkable as well.  His hemoglobin was 12.5.  This compares favorably to a hemoglobin of 12.3 in April 2021.  White blood cell count was normal.  Stool studies from Dr. Yong [name] were normal and negative except for positive fecal lactoferrin.  History of difficile studies were performed.  Patient is on Metformin 2 g twice daily.  The current history is that of patient sitting down to move his bowels and experiencing a normal bowel movement followed by loose darker stools approximately once or twice per week.  No other symptoms.  Currently doing well at other times.  According to his full-time healthcare providers no incontinence.  Patient denies any further problems with abdominal pain.  There has been no bleeding.  They think he may have come off Metformin at 1  point to see if this would help his loose stools.  They are uncertain regarding this score the effects.  The patient continually repeats his story regarding his bowels.  He is somewhat irritable.   REVIEW OF SYSTEMS:  All non-GI ROS negative unless otherwise stated in the HPI except for sinus and allergies, arthritis, back pain, excessive urination, urinary frequency, nosebleeds  Past Medical History:  Diagnosis Date  . Abnormality of gait 08/23/2012  . Anemia    hx of  . Arthritis 01-03-12   osteoarthritis-knees  . CAD (coronary artery disease) 2004   s/p inferior wall Mi 2004 with PTCA/Stent; s/p CABG 2004  . Degenerative arthritis   . Diabetes mellitus    type II  . Foot drop, bilateral 08/23/2012  . GERD (gastroesophageal reflux disease)   . Heart disease   . History of kidney stones   . History of myocardial infarction   . Hyperlipidemia   . Myocardial infarction [place]) 01-03-12   '04  . MYOCARDIAL INFARCTION, HX OF 04/19/2007   Qualifier: Diagnosis of  By: Scherrie Gerlach    . Neuropathy 01-03-12   bil. feet  . Pneumonia    hx of  . Polyneuropathy in diabetes(357.2) 08/23/2012  . Radiculopathy of lumbar region 08/23/2011   Followed by Dr. Vertell Limber. S/p surgery. Resolved after surgery.      Past Surgical History:  Procedure Laterality Date  . BACK SURGERY  2013  . CARDIAC CATHETERIZATION  01-03-12   '04  . CATARACT EXTRACTION Bilateral 2015  . COLONOSCOPY    . CORONARY ARTERY BYPASS GRAFT  2004  LIMA to LAD; SVG to ramus intermedius; SVG to PDA/PLSA  . LAMINECTOMY  09/08/2011   Procedure: LUMBAR LAMINECTOMY FOR TUMOR;  Surgeon: Erline Levine, MD;  Location: [place] [facility];  Service: Neurosurgery;  Laterality: Left;  Left Thoracic twelve-Lumbar one transpedicular resection of epidural mass  . TONSILLECTOMY  as child  . TOTAL KNEE ARTHROPLASTY  01/15/2012   Procedure: TOTAL KNEE ARTHROPLASTY;  Surgeon: Gearlean Alf, MD;  Location: [facility];  Service: Orthopedics;  Laterality: Right;   . TOTAL KNEE ARTHROPLASTY Left 09/16/2012   Procedure: LEFT TOTAL KNEE ARTHROPLASTY;  Surgeon: Gearlean Alf, MD;  Location: [facility];  Service: Orthopedics;  Laterality: Left;    Social History Steven [name]  reports that he has never smoked. He has never used smokeless tobacco. He reports that he does not drink alcohol and does not use drugs.  family history includes Cancer in his sister; Cirrhosis in his mother; Heart disease in his maternal grandfather; Hypertension in his father; Stroke in his father, maternal grandmother, paternal grandfather, and paternal grandmother.  No Known Allergies     PHYSICAL EXAMINATION: Vital signs: BP 128/70   Pulse 72   Ht 5\' 5"  (1.651 m)   Wt 155 lb (70.3 kg)   BMI 25.79 kg/m   Constitutional: Elderly, somewhat frail, but otherwise generally well-appearing, no acute distress Psychiatric: alert and oriented x3, cooperative Eyes: extraocular movements intact, anicteric, conjunctiva pink Mouth: oral pharynx moist, no lesions Neck: supple no lymphadenopathy Cardiovascular: heart regular rate and rhythm, no murmur Lungs: clear to auscultation bilaterally Abdomen: soft, nontender, nondistended, no obvious ascites, no peritoneal signs, normal bowel sounds, no organomegaly Rectal: No external abnormalities.  No tenderness or mass.  Good tone.  Stool is formed and soft.  No impaction.  Hemoccult negative. Extremities: no lower extremity edema bilaterally Skin: no lesions on visible extremities Neuro: No focal deficits.  Cranial nerves intact  ASSESSMENT:  1.  Patient does NOT have chronic diarrhea at this time.  He describes the infrequent experience of having a normal bowel movement followed during that same session with loose stool which he describes as darker.  No change in hemoglobin from baseline.  Normal abdominal and rectal examination today with normal stool which is Hemoccult negative.  Colonoscopy 10 years ago as described.  Previous positive  lactoferrin noted but adds very little to this assessment.   PLAN:  1.  Reassurance 2.  No further work-up based on all available information at hand 3.  GI follow-up as needed Total time of 45 minutes was spent preparing to see the patient, reviewing test, x-rays, and microbiology.  Also obtaining history from patient and his daughter, performing comprehensive physical examination, counseling the patient and his daughter regarding his above listed issues, reviewing previous test results, providing reassurance, and documenting all clinical information in the health record

## 2019-11-21 NOTE — Patient Instructions (Signed)
If you are age 84 or older, your body mass index should be between 23-30. Your Body mass index is 25.79 kg/m. If this is out of the aforementioned range listed, please consider follow up with your Primary Care Provider.  If you are age 18 or younger, your body mass index should be between 19-25. Your Body mass index is 25.79 kg/m. If this is out of the aformentioned range listed, please consider follow up with your Primary Care Provider.   Follow up as needed.

## 2019-11-24 ENCOUNTER — Other Ambulatory Visit: Payer: Self-pay

## 2019-12-08 ENCOUNTER — Ambulatory Visit: Payer: Medicare Other | Admitting: Family Medicine

## 2020-01-07 ENCOUNTER — Other Ambulatory Visit: Payer: Self-pay | Admitting: Internal Medicine

## 2020-01-08 NOTE — Telephone Encounter (Signed)
Please contact pt for 6 month f/u.  Pt overdue for f/u.

## 2020-01-23 ENCOUNTER — Other Ambulatory Visit: Payer: Self-pay

## 2020-01-23 ENCOUNTER — Ambulatory Visit (INDEPENDENT_AMBULATORY_CARE_PROVIDER_SITE_OTHER): Payer: Medicare Other | Admitting: Physician Assistant

## 2020-01-23 ENCOUNTER — Encounter: Payer: Self-pay | Admitting: Physician Assistant

## 2020-01-23 VITALS — BP 130/80 | HR 56 | Temp 98.1°F | Ht 65.0 in

## 2020-01-23 DIAGNOSIS — Z23 Encounter for immunization: Secondary | ICD-10-CM | POA: Diagnosis not present

## 2020-01-23 DIAGNOSIS — S41111A Laceration without foreign body of right upper arm, initial encounter: Secondary | ICD-10-CM | POA: Diagnosis not present

## 2020-01-23 DIAGNOSIS — M25552 Pain in left hip: Secondary | ICD-10-CM | POA: Diagnosis not present

## 2020-01-23 MED ORDER — DOXYCYCLINE HYCLATE 100 MG PO TABS
100.0000 mg | ORAL_TABLET | Freq: Two times a day (BID) | ORAL | 0 refills | Status: DC
Start: 2020-01-23 — End: 2020-05-26

## 2020-01-23 MED ORDER — MUPIROCIN 2 % EX OINT: TOPICAL_OINTMENT | CUTANEOUS | 0 refills | Status: DC

## 2020-01-23 NOTE — Patient Instructions (Signed)
It was great to see you!  Your high dose flu shot was given today.  We also updated your tetanus given your recent wound.  Start oral doxycycline for your wound.  Use warm water and mild soap to cleanse area daily. After cleaning, pat dry. Apply prescription mupirocin ointment to wound. Keep area covered.  If no improvement, or any worsening, please come back and see [place].   If fever, chills or other concerns, please let us know.  For your hip: An order for an xray has been put in for you. To get your xray, you can walk in at the [facility] location without a scheduled appointment.  The address is [address]. It is across the street from [place] is located in the basement.  Hours of operation are M-F 8:30am to 5:00pm. Please note that they are closed for lunch between 12:30 and 1:00pm.   Take care,  Inda Coke PA-C

## 2020-01-23 NOTE — Progress Notes (Signed)
Steven [name] is a 86 y.o. male here for a new problem.  I acted as a Education administrator for [employer], PA-C Anselmo Pickler, LPN   History of Present Illness:   Chief Complaint  Patient presents with  . Fall  . Wound Check    HPI   Wound check Pt fell on Sunday at home. He is here with a caregiver who was not present during the fall and she does not know any details about the fall.   Patient injured right arm. He has 2 open areas on right forearm, with redness, tenderness and discharge. Daughter has been applying a "healing ointment" and cortisone ointment.   Denies: fevers, chills, malaise, LOC, head trauma, vision changes  L hip pain Also endorses L hip pain since the fall. He does not know if he landed on his hip. Caregiver reports that he started verbalizing that his hip was bothering it when he tried to walk around with his walker on Monday. He is still able to walk but has pain. Denies numbness or tingling in LLE. Does have hx of osteoporosis.  Past Medical History:  Diagnosis Date  . Abnormality of gait 08/23/2012  . Anemia    hx of  . Arthritis 01-03-12   osteoarthritis-knees  . CAD (coronary artery disease) 2004   s/p inferior wall Mi 2004 with PTCA/Stent; s/p CABG 2004  . Degenerative arthritis   . Diabetes mellitus    type II  . Foot drop, bilateral 08/23/2012  . GERD (gastroesophageal reflux disease)   . Heart disease   . History of kidney stones   . History of myocardial infarction   . Hyperlipidemia   . Myocardial infarction [facility]) 01-03-12   '04  . MYOCARDIAL INFARCTION, HX OF 04/19/2007   Qualifier: Diagnosis of  By: Scherrie Gerlach    . Neuropathy 01-03-12   bil. feet  . Pneumonia    hx of  . Polyneuropathy in diabetes(357.2) 08/23/2012  . Radiculopathy of lumbar region 08/23/2011   Followed by Dr. Vertell Limber. S/p surgery. Resolved after surgery.       Social History   Tobacco Use  . Smoking status: Never Smoker  . Smokeless tobacco: Never Used  Vaping Use  .  Vaping Use: Never used  Substance Use Topics  . Alcohol use: No  . Drug use: No    Past Surgical History:  Procedure Laterality Date  . BACK SURGERY  2013  . CARDIAC CATHETERIZATION  01-03-12   '04  . CATARACT EXTRACTION Bilateral 2015  . COLONOSCOPY    . CORONARY ARTERY BYPASS GRAFT  2004   LIMA to LAD; SVG to ramus intermedius; SVG to PDA/PLSA  . LAMINECTOMY  09/08/2011   Procedure: LUMBAR LAMINECTOMY FOR TUMOR;  Surgeon: Erline Levine, MD;  Location: [place] [facility];  Service: Neurosurgery;  Laterality: Left;  Left Thoracic twelve-Lumbar one transpedicular resection of epidural mass  . TONSILLECTOMY  as child  . TOTAL KNEE ARTHROPLASTY  01/15/2012   Procedure: TOTAL KNEE ARTHROPLASTY;  Surgeon: Gearlean Alf, MD;  Location: [facility];  Service: Orthopedics;  Laterality: Right;  . TOTAL KNEE ARTHROPLASTY Left 09/16/2012   Procedure: LEFT TOTAL KNEE ARTHROPLASTY;  Surgeon: Gearlean Alf, MD;  Location: [facility];  Service: Orthopedics;  Laterality: Left;    Family History  Problem Relation Age of Onset  . Cirrhosis Mother        etiology unclear  . Hypertension Father   . Stroke Father   . Cancer Sister  breast  . Stroke Maternal Grandmother   . Heart disease Maternal Grandfather        MI  . Stroke Paternal Grandmother   . Stroke Paternal Grandfather   . Anesthesia problems Neg Hx   . Hypotension Neg Hx   . Malignant hyperthermia Neg Hx   . Pseudochol deficiency Neg Hx     No Known Allergies  Current Medications:   Current Outpatient Medications:  .  acetaminophen (TYLENOL) 500 MG tablet, Take 500 mg by mouth every 6 (six) hours as needed. Up to 6 tabs (3gm) qd, Disp: , Rfl:  .  alendronate (FOSAMAX) 70 MG tablet, TAKE 1 TABLET BY MOUTH EVERY 7 DAYS WITH A FULL GLASS OF WATER ON AN EMPTY STOMACH, Disp: 12 tablet, Rfl: 4 .  aspirin 81 MG tablet, Take 81 mg by mouth daily., Disp: , Rfl:  .  atorvastatin (LIPITOR) 80 MG tablet, TAKE 1 TABLET DAILY., Disp: 90 tablet, Rfl:  3 .  cycloSPORINE (RESTASIS) 0.05 % ophthalmic emulsion, 1 drop 2 (two) times daily., Disp: , Rfl:  .  diclofenac sodium (VOLTAREN) 1 % GEL, Apply 4 g topically 4 (four) times daily. (Patient taking differently: Apply 4 g topically in the morning and at bedtime. ), Disp: 100 g, Rfl: 1 .  diltiazem (MATZIM LA) 180 MG 24 hr tablet, Take 1 tablet (180 mg total) by mouth daily., Disp: 90 tablet, Rfl: 1 .  glimepiride (AMARYL) 1 MG tablet, Take 1 tablet (1 mg total) by mouth daily with breakfast., Disp: 90 tablet, Rfl: 3 .  glucose blood test strip, Use to test your blood sugar daily. E11.9 One touch Verio test strips, Disp: 100 each, Rfl: 12 .  hydrocortisone 2.5 % cream, APPLICATIONS APPLY ON THE SKIN EVERY DAY, Disp: , Rfl:  .  Lancets (ONETOUCH ULTRASOFT) lancets, USE TO TEST BLOOD SUGARS DAILY., Disp: 100 each, Rfl: 3 .  lisinopril (ZESTRIL) 2.5 MG tablet, Take a half tab daily., Disp: 45 tablet, Rfl: 3 .  metFORMIN (GLUCOPHAGE) 1000 MG tablet, TAKE 1 TABLET (1,000 MG TOTAL) BY MOUTH 2 (TWO) TIMES DAILY WITH A MEAL., Disp: 180 tablet, Rfl: 3 .  metoprolol succinate (TOPROL-XL) 50 MG 24 hr tablet, TAKE 1 TABLET BY MOUTH EVERY DAY, Disp: 30 tablet, Rfl: 0 .  omeprazole (PRILOSEC) 20 MG capsule, Take 1 capsule (20 mg total) by mouth daily. (Patient taking differently: Take 20 mg by mouth as needed. ), Disp: 90 capsule, Rfl: 3 .  ONETOUCH VERIO test strip, USE TO TEST BLOOD SUGARS DAILY., Disp: 100 each, Rfl: 3 .  traMADol (ULTRAM) 50 MG tablet, Take 1 tablet (50 mg total) by mouth every 8 (eight) hours as needed., Disp: 60 tablet, Rfl: 1 .  doxycycline (VIBRA-TABS) 100 MG tablet, Take 1 tablet (100 mg total) by mouth 2 (two) times daily., Disp: 20 tablet, Rfl: 0 .  mupirocin ointment (BACTROBAN) 2 %, Apply to affected area 1-2 times daily, Disp: 22 g, Rfl: 0   Review of Systems:   ROS Negative unless otherwise specified per HPI.  Vitals:   Vitals:   01/23/20 1443  BP: 130/80  Pulse: (!) 56   Temp: 98.1 F (36.7 C)  TempSrc: Temporal  SpO2: 98%  Height: 5\' 5"  (1.651 m)     Body mass index is 25.79 kg/m.  Physical Exam:   Physical Exam Vitals and nursing note reviewed.  Constitutional:      General: He is not in acute distress.    Appearance: He is well-developed.  He is not ill-appearing or toxic-appearing.  Cardiovascular:     Rate and Rhythm: Normal rate and regular rhythm.     Pulses: Normal pulses.     Heart sounds: Normal heart sounds, S1 normal and S2 normal.     Comments: No LE edema Pulmonary:     Effort: Pulmonary effort is normal.     Breath sounds: Normal breath sounds.  Musculoskeletal:     Comments: Normal ROM to LUE and fingers/hand  Patient in wheelchair -- when extending L leg, endorses pain to L hip. No L hip TTP.  Skin:    General: Skin is warm and dry.     Comments: Lacerations x 2 to RUE, anterior forearm; both lacerations with scant purulent dischage and slight TTP. Entire RLE anterior forearm with scattered ecchymosis  Neurological:     Mental Status: He is alert.     GCS: GCS eye subscore is 4. GCS verbal subscore is 5. GCS motor subscore is 6.  Psychiatric:        Speech: Speech normal.        Behavior: Behavior normal. Behavior is cooperative.      Assessment and Plan:   Yasmin was seen today for fall and wound check.  Diagnoses and all orders for this visit:  Left hip pain Recommend xray for further evaluation since we essentially have no idea true mechanism of injury and he has positive hx of osteoporosis. Caregiver agreeable to plan, will likely get xray early next week. If worsening symptoms in the meantime, was recommended to go to the urgent care. -     DG HIP UNILAT W OR W/O PELVIS 2-3 VIEWS LEFT; Future  Laceration of arm, right, multiple sites, initial encounter Area cleaned and bandaged. Does look like secondary skin infection -- will treat with oral doxycycline, recommend avoiding current ointments and trial  bactroban ointment. Discussed proper wound care. If concerns for infection, recommend following up with our office ASAP. Update Td today. -     Td vaccine greater than or equal to 7yo preservative free IM  Need for immunization against influenza -     Flu Vaccine QUAD High Dose(Fluad)  Other orders -     doxycycline (VIBRA-TABS) 100 MG tablet; Take 1 tablet (100 mg total) by mouth 2 (two) times daily. -     mupirocin ointment (BACTROBAN) 2 %; Apply to affected area 1-2 times daily  CMA or LPN served as scribe during this visit. History, Physical, and Plan performed by medical provider. The above documentation has been reviewed and is accurate and complete.  Inda Coke, PA-C

## 2020-01-27 ENCOUNTER — Ambulatory Visit: Payer: Medicare Other | Admitting: Internal Medicine

## 2020-02-11 ENCOUNTER — Other Ambulatory Visit: Payer: Self-pay | Admitting: Internal Medicine

## 2020-02-12 ENCOUNTER — Telehealth: Payer: Self-pay | Admitting: Family Medicine

## 2020-02-12 NOTE — Telephone Encounter (Signed)
Left message for patient to call back and schedule Medicare Annual Wellness Visit (AWV) either virtually/audio only OR in office. Whatever the patients preference is.  Last AWV 01/09/19; please schedule at anytime with [facility]-Nurse Health Advisor at [facility].  This should be a 45 minute visit.

## 2020-03-04 ENCOUNTER — Telehealth: Payer: Self-pay

## 2020-03-04 NOTE — Telephone Encounter (Signed)
Palliative care check in made on 02/25/20- no answer. Will f/u with email

## 2020-03-04 NOTE — Telephone Encounter (Signed)
Email sent to check in.

## 2020-03-15 ENCOUNTER — Ambulatory Visit (INDEPENDENT_AMBULATORY_CARE_PROVIDER_SITE_OTHER): Payer: Medicare Other | Admitting: Internal Medicine

## 2020-03-15 ENCOUNTER — Other Ambulatory Visit: Payer: Self-pay

## 2020-03-15 ENCOUNTER — Encounter: Payer: Self-pay | Admitting: Internal Medicine

## 2020-03-15 VITALS — BP 143/77 | HR 71 | Ht 69.0 in

## 2020-03-15 DIAGNOSIS — I6529 Occlusion and stenosis of unspecified carotid artery: Secondary | ICD-10-CM

## 2020-03-15 DIAGNOSIS — Z951 Presence of aortocoronary bypass graft: Secondary | ICD-10-CM | POA: Diagnosis not present

## 2020-03-15 DIAGNOSIS — R011 Cardiac murmur, unspecified: Secondary | ICD-10-CM

## 2020-03-15 DIAGNOSIS — I25810 Atherosclerosis of coronary artery bypass graft(s) without angina pectoris: Secondary | ICD-10-CM | POA: Diagnosis not present

## 2020-03-15 DIAGNOSIS — I493 Ventricular premature depolarization: Secondary | ICD-10-CM | POA: Diagnosis not present

## 2020-03-15 NOTE — Progress Notes (Signed)
OFFICE NOTE  Chief Complaint:  No compliants  Primary Care Physician: Marin Olp, MD  HPI:  Steven [name] is a pleasant 86 year old male with history of coronary artery disease and coronary artery bypass grafting in 2004 after an inferior MI. He also has had a stent. He hasn't has diabetes, neuropathy, dyslipidemia, GERD and foot drop on the left for which he wears a brace. He has been followed by Dr. Dorris Carnes at with our cardiology. He saw her last in June of 2013 but reported since she is not seeing patients regularly in the office that he wished to see a different cardiologist. He has not had a stress test and a number of years. Recently he describes an episode where he was in his kitchen and felt a sharp pain in his left chest. This caused him to immediately turn his head violently to the right and he felt dizzy and then passed out. He does not remember falling but did catch himself and was confused when he was found fairly shortly afterwards on the floor. He does report he had an episode similar to this about one year ago where he fell backwards but was caught and did not totally pass out. He denies his heart racing or any periods of awareness of dizziness or blurred vision prior to the event.  The chest pain was described as sharp and sudden in the left chest like he was "punched" in the left chest. He has had no further chest pain or events.  He underwent nuclear stress testing recently which showed patent grafts and a small fixed inferior defect consistent with prior inferior MI. EF is preserved.  Steven [name] returns today for followup. He is feeling quite well. He has had no further syncopal events. He did have carotid Dopplers which showed moderate disease in the right and mild disease of the left. Is concerned about stroke.  I have assured him that that is unlikely this time and that we will continue to follow his carotid Dopplers closely. He has no chest pain complaints. Blood  pressure is well-controlled today.  09/03/2015  Steven [name] was seen today in the office in follow-up. Overall he seems to be doing pretty well. His carotid artery disease seems stable. He reports some shortness of breath on exertion. This is not necessarily worse than it did previously been. His EKG today however does show prominent Q waves in 3 and aVF. He does have a history of inferior MI however there are some new lateral T-wave inversions. For some reason the Q waves were previously present. His last echocardiogram did show inferior hypokinesis and 2015 however LVEF was preserved. Blood pressure is top normal today but he says well controlled at home. He is on Lipitor for dyslipidemia.  10/06/2016  Steven [name] was seen today in follow-up. Overall he seems to be doing well. He denies any worsening shortness of breath or chest pain. Fact he's "scared" that he's feeling so well. We've followed carotid Dopplers recently his study showed no significant interval change. He did have an echocardiogram last year which showed a small decrease in LV function do 50-55% but the aortic root was dilated to 4.4 cm. In 2015 the study and indicated his aortic root measured 3.8 cm. I discussed that with him today and the fact that it suggests that there is possibly aneurysmal dilatation or effacement of the aortic root, we would need to consider repeat imaging of this.   12/12/2017  Mr.  [name] is seen today in follow-up.  He has no specific complaints.  Interestingly his EKG today shows frequent ventricular ectopy with either every other or every third beat noted to be a PVC.  He denies any chest pain or worsening shortness of breath.  He does have previous bypass grafts that are quite old, dating back to 2004.  Blood pressure is slightly elevated today 149/66.  02/27/2018  Steven [name] returns today for follow-up of his stress test.  He underwent Lexiscan Myoview stress testing on February 15, 2018 which showed a fixed  inferior infarct and LVEF of 48% with inferior hypokinesis.  This consistent with prior studies he has had an known inferior MI in 2004.  It was no new reversible ischemia.  Blood pressure is elevated today 170/77.  Based on these findings the fact that there might be a mild decrease in LV function, I think there is room to increase his beta-blocker further.  10/28/2018  Steven [name] returns today for follow-up.  He denies any chest pain or worsening shortness of breath.  Blood pressure is similar to numbers in the past which is mildly elevated however he says it is lower at home.  His weight has been fairly stable.  His only really struggle is with some arthritis and getting around.  He said he did have a fall the other day where he has some soreness over his right hip region.  He had labs about 8 months ago which showed an LDL of 74 and hemoglobin A1c of 5.9.  03/15/2020  Steven [name] returns today for follow-up.  He is accompanied by his caregiver who spends most of the days with him.  She notes that he is very physically inactive.  He does have a dropfoot and had a problem with his brace.  This is further hampering his mobility.  He denies any chest pain or worsening shortness of breath.  He did have lab work earlier this year which showed good cholesterol control with LDL of 69.  EKG today shows sinus rhythm with PVCs which have been previously noted.  Blood pressure was mildly elevated at 143/77.  PMHx:  Past Medical History:  Diagnosis Date  . Abnormality of gait 08/23/2012  . Anemia    hx of  . Arthritis 01-03-12   osteoarthritis-knees  . CAD (coronary artery disease) 2004   s/p inferior wall Mi 2004 with PTCA/Stent; s/p CABG 2004  . Degenerative arthritis   . Diabetes mellitus    type II  . Foot drop, bilateral 08/23/2012  . GERD (gastroesophageal reflux disease)   . Heart disease   . History of kidney stones   . History of myocardial infarction   . Hyperlipidemia   . Myocardial infarction  [facility]) 01-03-12   '04  . MYOCARDIAL INFARCTION, HX OF 04/19/2007   Qualifier: Diagnosis of  By: Scherrie Gerlach    . Neuropathy 01-03-12   bil. feet  . Pneumonia    hx of  . Polyneuropathy in diabetes(357.2) 08/23/2012  . Radiculopathy of lumbar region 08/23/2011   Followed by Dr. Vertell Limber. S/p surgery. Resolved after surgery.      Past Surgical History:  Procedure Laterality Date  . BACK SURGERY  2013  . CARDIAC CATHETERIZATION  01-03-12   '04  . CATARACT EXTRACTION Bilateral 2015  . COLONOSCOPY    . CORONARY ARTERY BYPASS GRAFT  2004   LIMA to LAD; SVG to ramus intermedius; SVG to PDA/PLSA  . LAMINECTOMY  09/08/2011   Procedure:  LUMBAR LAMINECTOMY FOR TUMOR;  Surgeon: Erline Levine, MD;  Location: [place];  Service: Neurosurgery;  Laterality: Left;  Left Thoracic twelve-Lumbar one transpedicular resection of epidural mass  . TONSILLECTOMY  as child  . TOTAL KNEE ARTHROPLASTY  01/15/2012   Procedure: TOTAL KNEE ARTHROPLASTY;  Surgeon: Gearlean Alf, MD;  Location: [facility];  Service: Orthopedics;  Laterality: Right;  . TOTAL KNEE ARTHROPLASTY Left 09/16/2012   Procedure: LEFT TOTAL KNEE ARTHROPLASTY;  Surgeon: Gearlean Alf, MD;  Location: [facility];  Service: Orthopedics;  Laterality: Left;    FAMHx:  Family History  Problem Relation Age of Onset  . Cirrhosis Mother        etiology unclear  . Hypertension Father   . Stroke Father   . Cancer Sister        breast  . Stroke Maternal Grandmother   . Heart disease Maternal Grandfather        MI  . Stroke Paternal Grandmother   . Stroke Paternal Grandfather   . Anesthesia problems Neg Hx   . Hypotension Neg Hx   . Malignant hyperthermia Neg Hx   . Pseudochol deficiency Neg Hx     SOCHx:   reports that he has never smoked. He has never used smokeless tobacco. He reports that he does not drink alcohol and does not use drugs.  ALLERGIES:  No Known Allergies  ROS: Pertinent items noted in HPI and remainder of comprehensive ROS  otherwise negative.  HOME MEDS: Current Outpatient Medications  Medication Sig Dispense Refill  . acetaminophen (TYLENOL) 500 MG tablet Take 500 mg by mouth every 6 (six) hours as needed. Up to 6 tabs (3gm) qd    . alendronate (FOSAMAX) 70 MG tablet TAKE 1 TABLET BY MOUTH EVERY 7 DAYS WITH A FULL GLASS OF WATER ON AN EMPTY STOMACH 12 tablet 4  . aspirin 81 MG tablet Take 81 mg by mouth daily.    Marland Kitchen atorvastatin (LIPITOR) 80 MG tablet TAKE 1 TABLET DAILY. 90 tablet 3  . cycloSPORINE (RESTASIS) 0.05 % ophthalmic emulsion 1 drop 2 (two) times daily.    . diclofenac sodium (VOLTAREN) 1 % GEL Apply 4 g topically 4 (four) times daily. (Patient taking differently: Apply 4 g topically in the morning and at bedtime. ) 100 g 1  . diltiazem (MATZIM LA) 180 MG 24 hr tablet Take 1 tablet (180 mg total) by mouth daily. 90 tablet 1  . doxycycline (VIBRA-TABS) 100 MG tablet Take 1 tablet (100 mg total) by mouth 2 (two) times daily. 20 tablet 0  . glimepiride (AMARYL) 1 MG tablet Take 1 tablet (1 mg total) by mouth daily with breakfast. 90 tablet 3  . glucose blood test strip Use to test your blood sugar daily. E11.9 One touch Verio test strips 100 each 12  . hydrocortisone 2.5 % cream APPLICATIONS APPLY ON THE SKIN EVERY DAY    . Lancets (ONETOUCH ULTRASOFT) lancets USE TO TEST BLOOD SUGARS DAILY. 100 each 3  . lisinopril (ZESTRIL) 2.5 MG tablet Take a half tab daily. 45 tablet 3  . metFORMIN (GLUCOPHAGE) 1000 MG tablet TAKE 1 TABLET (1,000 MG TOTAL) BY MOUTH 2 (TWO) TIMES DAILY WITH A MEAL. 180 tablet 3  . metoprolol succinate (TOPROL-XL) 50 MG 24 hr tablet TAKE 1 TABLET BY MOUTH EVERY DAY 90 tablet 1  . mupirocin ointment (BACTROBAN) 2 % Apply to affected area 1-2 times daily 22 g 0  . omeprazole (PRILOSEC) 20 MG capsule Take 1 capsule (  20 mg total) by mouth daily. (Patient taking differently: Take 20 mg by mouth as needed. ) 90 capsule 3  . ONETOUCH VERIO test strip USE TO TEST BLOOD SUGARS DAILY. 100 each 3   . traMADol (ULTRAM) 50 MG tablet Take 1 tablet (50 mg total) by mouth every 8 (eight) hours as needed. 60 tablet 1   No current facility-administered medications for this visit.    LABS/IMAGING: No results found for this or any previous visit (from the past 48 hour(s)). No results found.  VITALS: BP (!) 143/77   Pulse 71   Ht 5\' 9"  (1.753 m)   SpO2 99%   BMI 22.89 kg/m   EXAM: General appearance: alert and no distress Neck: no carotid bruit, no JVD and thyroid not enlarged, symmetric, no tenderness/mass/nodules Lungs: clear to auscultation bilaterally Heart: regular rate and rhythm, S1, S2 normal, no murmur, click, rub or gallop Abdomen: soft, non-tender; bowel sounds normal; no masses,  no organomegaly Extremities: extremities normal, atraumatic, no cyanosis or edema and No ecchymosis or hematoma over the right hip Pulses: 2+ and symmetric Skin: Pale, warm, dry Neurologic: Grossly normal Psych: Pleasant  EKG: Sinus rhythm with PVCs at 71, poor R wave progression anteriorly-personally reviewed  ASSESSMENT: 1. Frequent PVCs (new finding) -low risk Myoview stress test with inferior scar and LVEF 48% (02/2018) 2. History of syncope, likely vasovagal 3. Coronary artery disease status post CABG in 2004 4. History of inferior MI 5. Diabetes type 2 6. Peripheral neuropathy 7. HTN - controlled 8. Dyslipidemia 9. Low risk nuclear stress test 12/11/2012 10. Murmur 11. DOE - EF 50-55% (09/2015) 12. Dilated aortic root to 4.4 cm 13. Bilateral carotid artery disease  PLAN: 1.   Mr. Galicia is without complaints although according to his caregiver he is very inactive.  He has had no further syncopal episodes.  He denies any chest pain.  Blood pressure was a little elevated today.  His cholesterol is at goal.  He does have a history of dilated aortic root which will need reassessment.  He was noted to have low normal LVEF and moderate left atrial enlargement by echo in 2018.  I would  like to repeat the echo to make sure there have not been any significant interval changes.  Plan follow-up with me otherwise annually or sooner as necessary.  Pixie Casino, MD, [facility], [place] Director of the Advanced Lipid Disorders &  Cardiovascular Risk Reduction Clinic Diplomate of the American Board of Clinical Lipidology Attending Cardiologist  Direct Dial: [phone]  Fax: [phone]  Website:  [email or web address] 03/15/2020, 3:58 PM

## 2020-03-15 NOTE — Patient Instructions (Signed)
Medication Instructions:  No changes *If you need a refill on your cardiac medications before your next appointment, please call your pharmacy*   Lab Work: No Labs  If you have labs (blood work) drawn today and your tests are completely normal, you will receive your results only by: Marland Kitchen MyChart Message (if you have MyChart) OR . A paper copy in the mail If you have any lab test that is abnormal or we need to change your treatment, we will call you to review the results.   Testing/Procedures: [address] Doctor Phillips has requested that you have an echocardiogram. Echocardiography is a painless test that uses sound waves to create images of your heart. It provides your doctor with information about the size and shape of your heart and how well your heart's chambers and valves are working. This procedure takes approximately one hour. There are no restrictions for this procedure.    Follow-Up: At [facility], you and your health needs are our priority.  As part of our continuing mission to provide you with exceptional heart care, we have created designated Provider Care Teams.  These Care Teams include your primary Cardiologist (physician) and Advanced Practice Providers (APPs -  Physician Assistants and Nurse Practitioners) who all work together to provide you with the care you need, when you need it.  We recommend signing up for the patient portal called "MyChart".  Sign up information is provided on this After Visit Summary.  MyChart is used to connect with patients for Virtual Visits (Telemedicine).  Patients are able to view lab/test results, encounter notes, upcoming appointments, etc.  Non-urgent messages can be sent to your provider as well.   To learn more about what you can do with MyChart, go to [email or web address].    Your next appointment:   6 month(s)  The format for your next appointment:   In Person  Provider:   K. Mali Hilty, MD   Other  Instructions

## 2020-03-21 NOTE — Progress Notes (Deleted)
Phone: [phone]   Subjective:  Patient presents today for their annual physical. Chief complaint-noted.   See problem oriented charting- ROS- full  review of systems was completed and negative  except for: ***  The following were reviewed and entered/updated in epic: Past Medical History:  Diagnosis Date  . Abnormality of gait 08/23/2012  . Anemia    hx of  . Arthritis 01-03-12   osteoarthritis-knees  . CAD (coronary artery disease) 2004   s/p inferior wall Mi 2004 with PTCA/Stent; s/p CABG 2004  . Degenerative arthritis   . Diabetes mellitus    type II  . Foot drop, bilateral 08/23/2012  . GERD (gastroesophageal reflux disease)   . Heart disease   . History of kidney stones   . History of myocardial infarction   . Hyperlipidemia   . Myocardial infarction [facility]) 01-03-12   '04  . MYOCARDIAL INFARCTION, HX OF 04/19/2007   Qualifier: Diagnosis of  By: Scherrie Gerlach    . Neuropathy 01-03-12   bil. feet  . Pneumonia    hx of  . Polyneuropathy in diabetes(357.2) 08/23/2012  . Radiculopathy of lumbar region 08/23/2011   Followed by Dr. Vertell Limber. S/p surgery. Resolved after surgery.     Patient Active Problem List   Diagnosis Date Noted  . BPH (benign prostatic hyperplasia) 09/18/2019  . AAA (abdominal aortic aneurysm) ([place]) 12/13/2018  . Hx of CABG 12/13/2017  . PVC's (premature ventricular contractions) 12/13/2017  . Seborrheic dermatitis 09/13/2017  . Falls frequently 08/21/2017  . Senile purpura ([place]) 08/21/2017  . Aortic root dilatation ([place]) 10/06/2016  . History of acute inferior wall MI 09/03/2015  . Osteoporosis 01/07/2015  . Anemia 09/03/2013  . Essential hypertension, benign 10/11/2012  . Foot drop, left 08/23/2012  . History of supraventricular tachycardia 01/25/2011  . Hypogonadism in male 02/04/2010  . History of colonic polyps 09/07/2009  . CAD, ARTERY BYPASS GRAFT 05/21/2009  . Carotid artery stenosis 04/20/2008  . Diabetes mellitus type II, controlled  ([place]) 04/19/2007  . Hyperlipidemia 04/19/2007  . GERD 04/19/2007   Past Surgical History:  Procedure Laterality Date  . BACK SURGERY  2013  . CARDIAC CATHETERIZATION  01-03-12   '04  . CATARACT EXTRACTION Bilateral 2015  . COLONOSCOPY    . CORONARY ARTERY BYPASS GRAFT  2004   LIMA to LAD; SVG to ramus intermedius; SVG to PDA/PLSA  . LAMINECTOMY  09/08/2011   Procedure: LUMBAR LAMINECTOMY FOR TUMOR;  Surgeon: Erline Levine, MD;  Location: [place] [facility];  Service: Neurosurgery;  Laterality: Left;  Left Thoracic twelve-Lumbar one transpedicular resection of epidural mass  . TONSILLECTOMY  as child  . TOTAL KNEE ARTHROPLASTY  01/15/2012   Procedure: TOTAL KNEE ARTHROPLASTY;  Surgeon: Gearlean Alf, MD;  Location: [facility];  Service: Orthopedics;  Laterality: Right;  . TOTAL KNEE ARTHROPLASTY Left 09/16/2012   Procedure: LEFT TOTAL KNEE ARTHROPLASTY;  Surgeon: Gearlean Alf, MD;  Location: [facility];  Service: Orthopedics;  Laterality: Left;    Family History  Problem Relation Age of Onset  . Cirrhosis Mother        etiology unclear  . Hypertension Father   . Stroke Father   . Cancer Sister        breast  . Stroke Maternal Grandmother   . Heart disease Maternal Grandfather        MI  . Stroke Paternal Grandmother   . Stroke Paternal Grandfather   . Anesthesia problems Neg Hx   . Hypotension Neg Hx   .  Malignant hyperthermia Neg Hx   . Pseudochol deficiency Neg Hx     Medications- reviewed and updated Current Outpatient Medications  Medication Sig Dispense Refill  . acetaminophen (TYLENOL) 500 MG tablet Take 500 mg by mouth every 6 (six) hours as needed. Up to 6 tabs (3gm) qd    . alendronate (FOSAMAX) 70 MG tablet TAKE 1 TABLET BY MOUTH EVERY 7 DAYS WITH A FULL GLASS OF WATER ON AN EMPTY STOMACH 12 tablet 4  . aspirin 81 MG tablet Take 81 mg by mouth daily.    Marland Kitchen atorvastatin (LIPITOR) 80 MG tablet TAKE 1 TABLET DAILY. 90 tablet 3  . cycloSPORINE (RESTASIS) 0.05 % ophthalmic  emulsion 1 drop 2 (two) times daily.    . diclofenac sodium (VOLTAREN) 1 % GEL Apply 4 g topically 4 (four) times daily. (Patient taking differently: Apply 4 g topically in the morning and at bedtime. ) 100 g 1  . diltiazem (MATZIM LA) 180 MG 24 hr tablet Take 1 tablet (180 mg total) by mouth daily. 90 tablet 1  . doxycycline (VIBRA-TABS) 100 MG tablet Take 1 tablet (100 mg total) by mouth 2 (two) times daily. 20 tablet 0  . glimepiride (AMARYL) 1 MG tablet Take 1 tablet (1 mg total) by mouth daily with breakfast. 90 tablet 3  . glucose blood test strip Use to test your blood sugar daily. E11.9 One touch Verio test strips 100 each 12  . hydrocortisone 2.5 % cream APPLICATIONS APPLY ON THE SKIN EVERY DAY    . Lancets (ONETOUCH ULTRASOFT) lancets USE TO TEST BLOOD SUGARS DAILY. 100 each 3  . lisinopril (ZESTRIL) 2.5 MG tablet Take a half tab daily. 45 tablet 3  . metFORMIN (GLUCOPHAGE) 1000 MG tablet TAKE 1 TABLET (1,000 MG TOTAL) BY MOUTH 2 (TWO) TIMES DAILY WITH A MEAL. 180 tablet 3  . metoprolol succinate (TOPROL-XL) 50 MG 24 hr tablet TAKE 1 TABLET BY MOUTH EVERY DAY 90 tablet 1  . mupirocin ointment (BACTROBAN) 2 % Apply to affected area 1-2 times daily 22 g 0  . omeprazole (PRILOSEC) 20 MG capsule Take 1 capsule (20 mg total) by mouth daily. (Patient taking differently: Take 20 mg by mouth as needed. ) 90 capsule 3  . ONETOUCH VERIO test strip USE TO TEST BLOOD SUGARS DAILY. 100 each 3  . traMADol (ULTRAM) 50 MG tablet Take 1 tablet (50 mg total) by mouth every 8 (eight) hours as needed. 60 tablet 1   No current facility-administered medications for this visit.    Allergies-reviewed and updated No Known Allergies  Social History   Social History Narrative   Married 1956.  2 children. No grandkids. Neither children married.    1 daughter lives at house and watches over parents.       Retired from [employer]. Worked for hisself afterwards.       Regular exercise: no    Daily Caffeine: 3 cups coffee daily   Objective  Objective:  There were no vitals taken for this visit. Gen: NAD, resting comfortably HEENT: Mucous membranes are moist. Oropharynx normal Neck: no thyromegaly CV: RRR no murmurs rubs or gallops Lungs: CTAB no crackles, wheeze, rhonchi Abdomen: soft/nontender/nondistended/normal bowel sounds. No rebound or guarding.  Ext: no edema Skin: warm, dry Neuro: grossly normal, moves all extremities, PERRLA ***   Assessment and Plan  86 y.o. male presenting for annual physical.  Health Maintenance counseling: 1. Anticipatory guidance: Patient counseled regarding regular dental exams ***q6 months, eye exams ***yearly,  avoiding smoking and second hand smoke*** , limiting alcohol to 2 beverages per day ***.   2. Risk factor reduction:  Advised patient of need for regular exercise and diet rich and fruits and vegetables to reduce risk of heart attack and stroke. Exercise- ***. Diet-***.  Wt Readings from Last 3 Encounters:  11/21/19 155 lb (70.3 kg)  11/18/19 150 lb (68 kg)  09/18/19 153 lb (69.4 kg)   3. Immunizations/screenings/ancillary studies Immunization History  Administered Date(s) Administered  . Fluad Quad(high Dose 65+) 12/26/2018, 01/23/2020  . Influenza Split 01/25/2011, 01/16/2012  . Influenza Whole 04/26/2007, 01/27/2008, 02/09/2009, 01/18/2010  . Influenza, High Dose Seasonal PF 01/06/2016, 02/07/2017, 01/30/2018  . Influenza,inj,Quad PF,6+ Mos 12/30/2012, 01/15/2014, 12/25/2014  . PFIZER SARS-COV-2 Vaccination 06/30/2019, 08/04/2019  . Pneumococcal Conjugate-13 02/03/2013  . Pneumococcal Polysaccharide-23 02/25/1998, 01/25/2011  . Td 02/25/1998, 12/22/2008, 01/23/2020  . Zoster 01/27/2008   Health Maintenance Due  Topic Date Due  . FOOT EXAM  03/29/2018  . OPHTHALMOLOGY EXAM  12/04/2019   4. Prostate cancer screening- ***  Lab Results  Component Value Date   PSA 1.4 11/18/2019   PSA 1.01 02/03/2013   PSA 1.61  05/09/2010   5. Colon cancer screening - *** 6. Skin cancer screening- ***advised regular sunscreen use. Denies worrisome, changing, or new skin lesions.  7. *** smoker 8. STD screening - ***  Status of chronic or acute concerns   # Diabetes S: *** controlled on Amaryl 1mg , metformin 1000 mg twice a day CBGs- *** Exercise and diet-  A/P: ***  #Stenosis of right carotid artery S: October 10, 2018 moderate right and mild left carotid artery stenosis with plan to repeat in 1 year per Dr. Roanna Epley A/P: ***   #Atherosclerosis of coronary artery bypass graft of native heart without angina pectoris S: Patient follows regularly with Dr. Debara Pickett.  He is compliant with aspirin, statin, metoprolol.  CABG in 2004.  Today reports***  A/P: ***   Aortic root dilatation (HCC) S: Last imaged October 19, 2016 through Dr. Maudie Flakes aortic root size at that time. A/P: ***   Abdominal aortic aneurysm (AAA) without rupture [facility]) S: August 2020 showed distal aortic aneurysm 4.4 cm.  Stable May 2021.  Yearly follow-up needed.  Found incidentally during other work-up A/P: ***   Mixed hyperlipidemia S: Patient is compliant with atorvastatin 80 mg.  LDL goal under 70 A/P: ***   Essential hypertension, benign S: Compliant with metoprolol 50 mg extended release, diltiazem 180 mg extended release A/P: ***   #Osteoporosis S: Patient is compliant with Fosamax, calcium, vitamin D A/P: ***   *** No diagnosis found.  Recommended follow up: ***No follow-ups on file. Future Appointments  Date Time Provider [place]  03/22/2020  2:30 PM Wallene Huh, [place] [facility]  03/23/2020  3:00 PM Marin Olp, MD [facility]  04/12/2020  3:00 PM [facility] 4 [id number]  06/24/2020  3:00 PM Marin Olp, MD [facility]    No chief complaint on file.  Lab/Order associations:*** fasting No diagnosis found.  No orders of the defined types were placed in this  encounter.   Return precautions advised.  Clyde Lundborg, CMA

## 2020-03-22 ENCOUNTER — Ambulatory Visit (INDEPENDENT_AMBULATORY_CARE_PROVIDER_SITE_OTHER): Payer: Medicare Other | Admitting: Podiatry

## 2020-03-22 ENCOUNTER — Other Ambulatory Visit: Payer: Self-pay

## 2020-03-22 DIAGNOSIS — R2681 Unsteadiness on feet: Secondary | ICD-10-CM | POA: Diagnosis not present

## 2020-03-22 DIAGNOSIS — B351 Tinea unguium: Secondary | ICD-10-CM | POA: Diagnosis not present

## 2020-03-22 DIAGNOSIS — M21371 Foot drop, right foot: Secondary | ICD-10-CM | POA: Diagnosis not present

## 2020-03-22 DIAGNOSIS — I6529 Occlusion and stenosis of unspecified carotid artery: Secondary | ICD-10-CM

## 2020-03-23 ENCOUNTER — Encounter: Payer: Medicare Other | Admitting: Family Medicine

## 2020-03-23 DIAGNOSIS — Z0289 Encounter for other administrative examinations: Secondary | ICD-10-CM

## 2020-03-23 DIAGNOSIS — I10 Essential (primary) hypertension: Secondary | ICD-10-CM

## 2020-03-23 DIAGNOSIS — E119 Type 2 diabetes mellitus without complications: Secondary | ICD-10-CM

## 2020-03-23 DIAGNOSIS — K219 Gastro-esophageal reflux disease without esophagitis: Secondary | ICD-10-CM

## 2020-03-23 DIAGNOSIS — E782 Mixed hyperlipidemia: Secondary | ICD-10-CM

## 2020-03-23 DIAGNOSIS — Z0000 Encounter for general adult medical examination without abnormal findings: Secondary | ICD-10-CM

## 2020-03-24 NOTE — Progress Notes (Signed)
Subjective:   Patient ID: Steven [name], male   DOB: 86 y.o.   MRN: [id number]   HPI Patient presents with caregiver stating that he has had foot drop right that he needs a brace for and he has balance issues bilateral that he is concerned about and states that he wants to try to stay active as best as possible but is not able to do this secondary to the foot drop and balance issues.  He does present with caregiver today and does not smoke likes to be active if possible   Review of Systems  All other systems reviewed and are negative.       Objective:  Physical Exam Vitals and nursing note reviewed.  Constitutional:      Appearance: He is well-developed.  Pulmonary:     Effort: Pulmonary effort is normal.  Musculoskeletal:        General: Normal range of motion.  Skin:    General: Skin is warm.  Neurological:     Mental Status: He is alert.     Neurovascular status was found to be moderately diminished with diminished muscle strength bilateral and circulatory status adequate.  Patient is found to have loss of muscle function anterior tibial extensor group right and does have balance issues bilateral and is currently using a wheelchair to get into this office.  Patient does have nail disease with thickness and debris noted and does have good digital perfusion     Assessment:  Probability that were dealing here with foot drop right secondary to back issues along with balance issues and mycotic nail infection      Plan:  H&P reviewed all conditions and at this point had ped orthotist evaluate this patient who feels like he needs a AFO brace for right and scheduled him for this.  We reviewed different treatment options for the left organ to try a over-the-counter type brace system and patient will be seen back for right casting with education rendered.  Do not recommend treatment for nail mycotic disease

## 2020-03-29 ENCOUNTER — Ambulatory Visit: Payer: Medicare Other | Admitting: Orthotics

## 2020-03-29 ENCOUNTER — Other Ambulatory Visit: Payer: Self-pay

## 2020-03-29 DIAGNOSIS — M21371 Foot drop, right foot: Secondary | ICD-10-CM

## 2020-03-29 DIAGNOSIS — R2681 Unsteadiness on feet: Secondary | ICD-10-CM

## 2020-03-29 NOTE — Progress Notes (Signed)
Cast for articulated Az Right; standard MBB type left.

## 2020-04-12 ENCOUNTER — Ambulatory Visit (HOSPITAL_COMMUNITY): Payer: Medicare Other | Attending: Cardiology

## 2020-04-12 ENCOUNTER — Other Ambulatory Visit: Payer: Self-pay

## 2020-04-12 DIAGNOSIS — R011 Cardiac murmur, unspecified: Secondary | ICD-10-CM | POA: Diagnosis not present

## 2020-04-12 DIAGNOSIS — I6529 Occlusion and stenosis of unspecified carotid artery: Secondary | ICD-10-CM | POA: Diagnosis not present

## 2020-04-12 DIAGNOSIS — I25810 Atherosclerosis of coronary artery bypass graft(s) without angina pectoris: Secondary | ICD-10-CM | POA: Diagnosis not present

## 2020-04-12 LAB — ECHOCARDIOGRAM COMPLETE
Area-P 1/2: 3.56 cm2
S' Lateral: 2.9 cm

## 2020-05-06 ENCOUNTER — Telehealth: Payer: Self-pay

## 2020-05-06 NOTE — Telephone Encounter (Signed)
Volunteer called patient on behalf of Palliative Care and did not get a answer from patient/family. ° °

## 2020-05-10 IMAGING — DX DG CERVICAL SPINE COMPLETE 4+V
6 series · 6 of 6 positions shown · non-contrast
Comparison: None.

CLINICAL DATA: Neck pain.

EXAM:
CERVICAL SPINE - COMPLETE 4+ VIEW

[cervical spine lat (1 of 2)]
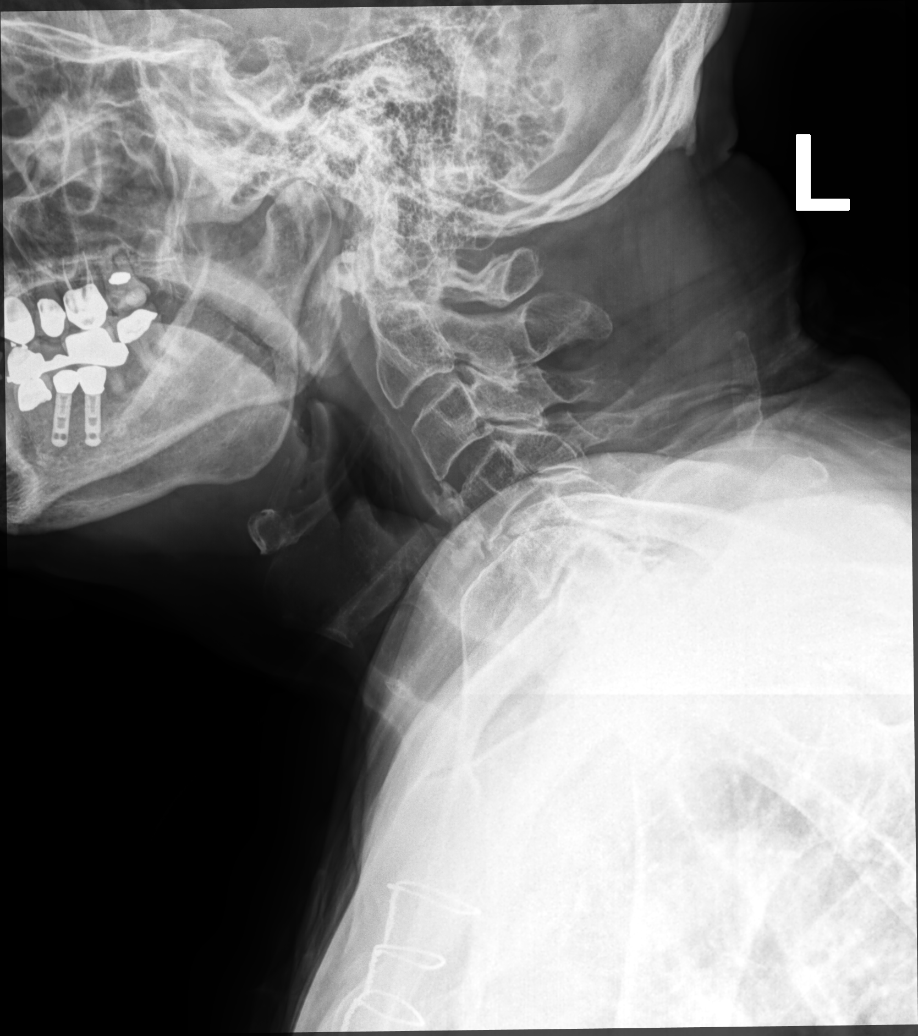

[cervical spine oblique (1 of 2)]
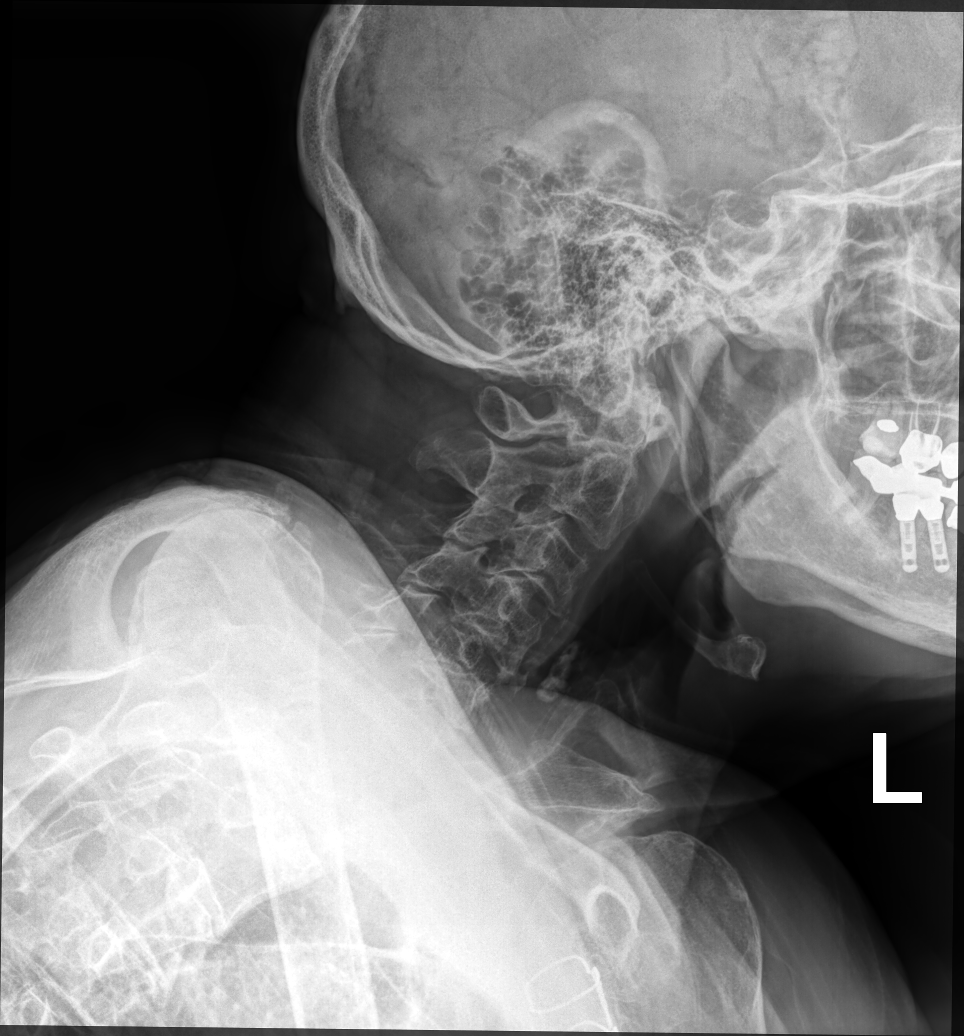

[cervical spine oblique (2 of 2)]
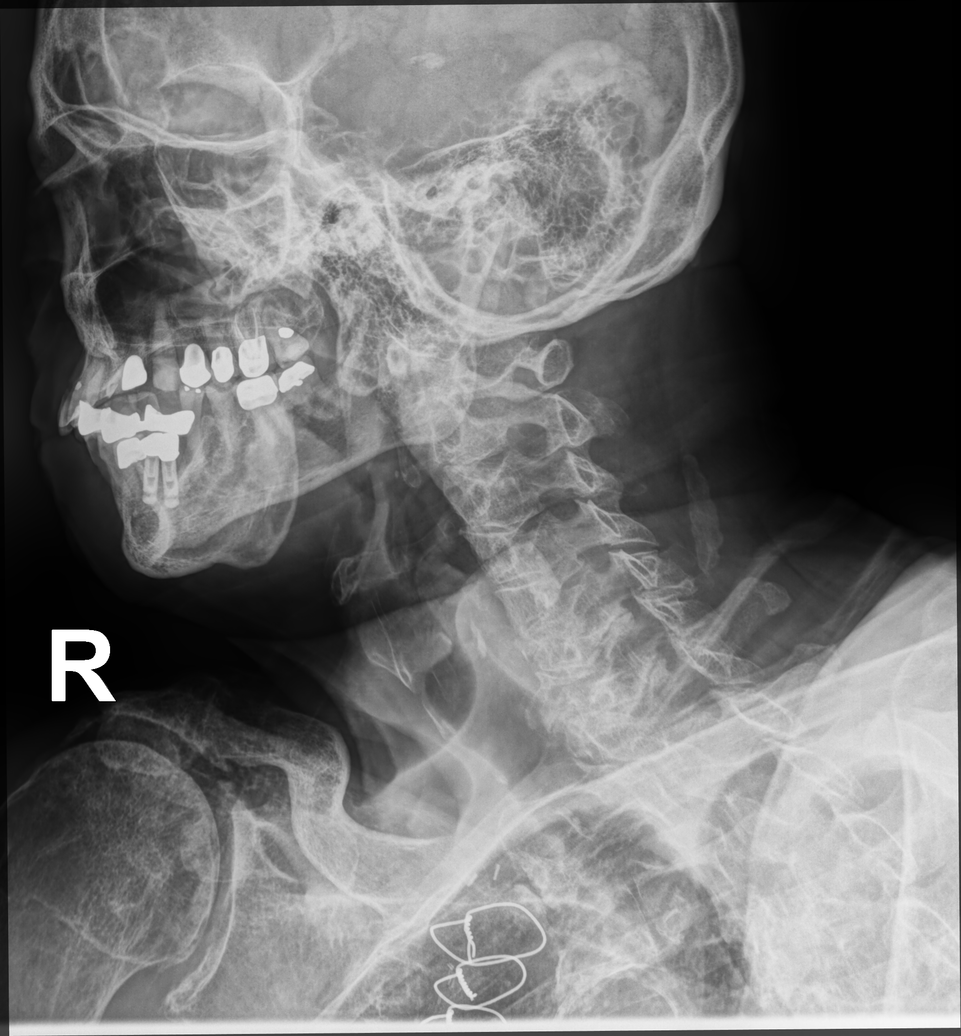

[cervical spine ap (1 of 2)]
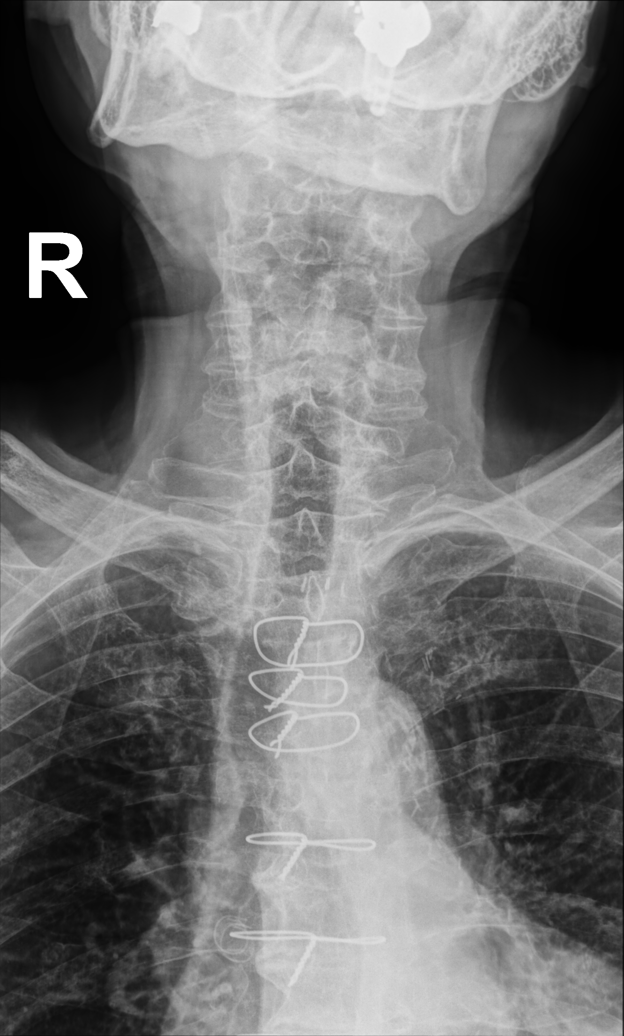

[cervical spine ap (2 of 2)]
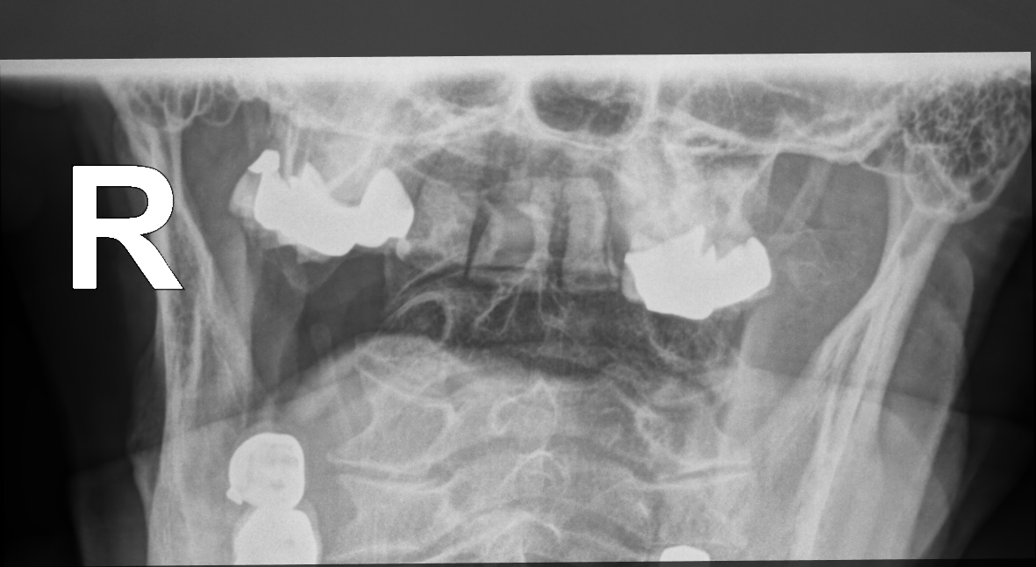

[cervical spine lat (2 of 2)]
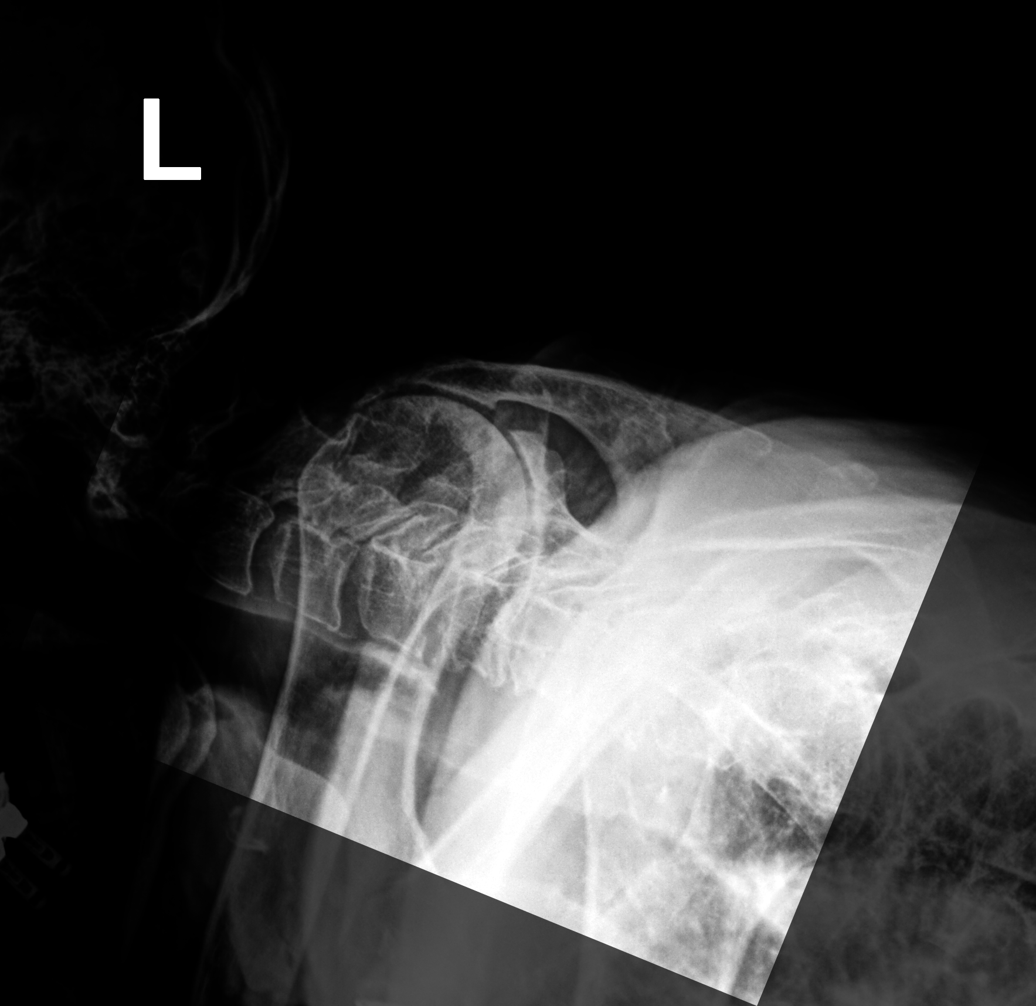

[6 of 6 positions shown; findings below may reference images not displayed]

FINDINGS: Images are limited by body habitus.

No fracture.  No bone lesion.  No spondylolisthesis.

Moderate loss of disc height at C5-C6 and C6-C7 with small endplate
spurs. Remaining cervical disc spaces are well preserved.

Moderate neural foraminal narrowing on the left at C 5-C6 and C6-C7
due to uncovertebral spurring. Right neural foramina suboptimally
visualized.

Skeletal structures are diffusely demineralized.

Soft tissues are unremarkable.
IMPRESSION: 1. No fracture or acute finding.
2. Degenerative changes as detailed.

## 2020-05-25 ENCOUNTER — Ambulatory Visit: Payer: Medicare Other | Admitting: Orthotics

## 2020-05-25 ENCOUNTER — Other Ambulatory Visit: Payer: Self-pay

## 2020-05-25 ENCOUNTER — Telehealth (INDEPENDENT_AMBULATORY_CARE_PROVIDER_SITE_OTHER): Payer: Medicare Other | Admitting: Family Medicine

## 2020-05-25 DIAGNOSIS — R067 Sneezing: Secondary | ICD-10-CM | POA: Diagnosis not present

## 2020-05-25 DIAGNOSIS — R0981 Nasal congestion: Secondary | ICD-10-CM

## 2020-05-25 DIAGNOSIS — R2681 Unsteadiness on feet: Secondary | ICD-10-CM | POA: Diagnosis not present

## 2020-05-25 DIAGNOSIS — R059 Cough, unspecified: Secondary | ICD-10-CM | POA: Diagnosis not present

## 2020-05-25 DIAGNOSIS — M21371 Foot drop, right foot: Secondary | ICD-10-CM

## 2020-05-25 DIAGNOSIS — E1151 Type 2 diabetes mellitus with diabetic peripheral angiopathy without gangrene: Secondary | ICD-10-CM | POA: Diagnosis not present

## 2020-05-25 DIAGNOSIS — M2142 Flat foot [pes planus] (acquired), left foot: Secondary | ICD-10-CM | POA: Diagnosis not present

## 2020-05-25 DIAGNOSIS — M2141 Flat foot [pes planus] (acquired), right foot: Secondary | ICD-10-CM | POA: Diagnosis not present

## 2020-05-25 MED ORDER — BENZONATATE 100 MG PO CAPS: 100.0000 mg | ORAL_CAPSULE | Freq: Three times a day (TID) | ORAL | 0 refills | Status: DC | PRN

## 2020-05-25 NOTE — Progress Notes (Signed)
Virtual Visit via Telephone Note  I connected with Steven [name] on 05/25/20 at  4:00 PM EST by telephone and verified that I am speaking with the correct person using two identifiers.   I discussed the limitations, risks, security and privacy concerns of performing an evaluation and management service by telephone and the availability of in person appointments. I also discussed with the patient that there may be a patient responsible charge related to this service. The patient expressed understanding and agreed to proceed.  Location patient: home, [place] Location provider: work or home office Participants present for the call: patient, provider, caregiver Patient did not have a visit with me in the prior 7 days to address this/these issue(s).   History of Present Illness:  Acute telemedicine visit for resp illness: -Onset: for several weeks or a month -Symptoms include: nasal congestion, sneezing, mild cough -Denies: fevers, SOB, CP, sob, vomiting, diarrhea, eating/drink/gets up ok -Pertinent medication allergies: nkda -COVID-19 vaccine status: fully vaccinated    Observations/Objective: Patient sounds cheerful and well on the phone. I do not appreciate any SOB. Speech and thought processing are grossly intact. Patient reported vitals:  Assessment and Plan:  Nasal congestion  Sneezing  Cough  -we discussed possible serious and likely etiologies, options for evaluation and workup, limitations of telemedicine visit vs in person visit, treatment, treatment risks and precautions. Pt prefers to treat via telemedicine empirically rather than in person at this moment. Query allergic rhinitis, VURI vs other. Opted to try a half dose of allegra daily, nasal saline and tessalon for cough. They also plan to do a home covid test. Scheduled follow up with PCP offered: advised follow up in 1-2 weeks to check in and make sure is improving, caregiver plans to schedule inperson visit with PCP as  reports has chronic worsening hip issues she wants Dr. Yong Channel to evaluate. Advised to seek prompt follow up sooner through PCP office or in person care if worsening, new symptoms arise, or if is not improving with treatment. Advised of options for inperson care in case PCP office not available. Did let the patient know that I only do telemedicine shifts for [facility] on Tuesdays and Thursdays and advised a follow up visit with PCP or at an [facility] if has further questions or concerns.   Follow Up Instructions:  I did not refer this patient for an OV with me in the next 24 hours for this/these issue(s).  I discussed the assessment and treatment plan with the patient. The patient was provided an opportunity to ask questions and all were answered. The patient agreed with the plan and demonstrated an understanding of the instructions.   I spent 12 minutes on the date of this visit in the care of this patient. See summary of tasks completed to properly care for this patient in the detailed notes above which also included counseling of above, review of PMH, medications, allergies, evaluation of the patient and ordering and/or  instructing patient on testing and care options.     Lucretia Kern, DO

## 2020-05-25 NOTE — Progress Notes (Signed)
Patient came in today to pick up [place] braces b/l.  He has h/o of foot drop R and gait instability/history of falls/poor balance.  He also uses a walker.  Patient p/up articulated brace (breeze) for Rt with dorsi-assist hinges; also breeze to help with gait instability LEFT.  Patient presents with daughter and care-taker; advised of break in period and how to report any issues.  Patient was very pleased in improved balancae and toe clearance.

## 2020-05-25 NOTE — Patient Instructions (Signed)
-  I sent the medication(s) we discussed to your pharmacy: Meds ordered this encounter  Medications  . benzonatate (TESSALON PERLES) 100 MG capsule    Sig: Take 1 capsule (100 mg total) by mouth 3 (three) times daily as needed.    Dispense:  20 capsule    Refill:  0   -nasal saline twice daily  -1 half dose of Allegra daily   -schedule follow up with your doctor in 1-2 weeks  I hope you are feeling better soon!  Seek in person care promptly if your symptoms worsen, new concerns arise or you are not improving with treatment.  It was nice to meet you today. I help [facility] out with telemedicine visits on Tuesdays and Thursdays and am available for visits on those days. If you have any concerns or questions following this visit please schedule a follow up visit with your Primary Care doctor or seek care at a local urgent care clinic to avoid delays in care.

## 2020-05-26 ENCOUNTER — Encounter: Payer: Self-pay | Admitting: Physician Assistant

## 2020-05-26 ENCOUNTER — Ambulatory Visit (INDEPENDENT_AMBULATORY_CARE_PROVIDER_SITE_OTHER): Payer: Medicare Other | Admitting: Physician Assistant

## 2020-05-26 VITALS — BP 130/70 | HR 76

## 2020-05-26 DIAGNOSIS — R059 Cough, unspecified: Secondary | ICD-10-CM

## 2020-05-26 DIAGNOSIS — J020 Streptococcal pharyngitis: Secondary | ICD-10-CM | POA: Diagnosis not present

## 2020-05-26 DIAGNOSIS — J069 Acute upper respiratory infection, unspecified: Secondary | ICD-10-CM | POA: Diagnosis not present

## 2020-05-26 LAB — POC INFLUENZA A&B (BINAX/QUICKVUE)
Influenza A, POC: NEGATIVE
Influenza B, POC: NEGATIVE

## 2020-05-26 LAB — POCT RAPID STREP A (OFFICE): Rapid Strep A Screen: POSITIVE — AB

## 2020-05-26 MED ORDER — AMOXICILLIN 500 MG PO CAPS: 1000.0000 mg | ORAL_CAPSULE | Freq: Two times a day (BID) | ORAL | 0 refills | Status: AC

## 2020-05-26 NOTE — Patient Instructions (Signed)
Take the amoxicillin as directed.  We will call with the results of your Covid 19 test.  Please go to the emergency department should symptoms suddenly worsen or change.

## 2020-05-26 NOTE — Progress Notes (Signed)
History of Present Illness: Patient is an 87 year old male who presents with his daughter, his caregiver, and his wife today.  He was originally scheduled for an in office visit about his hip pain, but daughter quickly corrected this visit at check-in and informed the front office staff that he needed to be re-evaluated for his respiratory symptoms that he was seen for yesterday via virtual visit.  Patient and his daughter state that he is no worse than yesterday, but his mucus was yellow in color and this had them concerned today.  He has had cough, congestion, sneezing for about the last week.  He denies any fever, chills, shortness of breath, chest pain, severe weakness, or other symptoms at this time.  He was evaluated by my CMA & myself via car visit in the parking lot today.   Observations/Objective:  ROS-see HPI for pertinent positives and negatives.  Vitals:   05/26/20 1549  BP: 130/70  Pulse: 76  SpO2: 98%   Gen: Awake, alert, no acute distress Resp: Breathing is even and non-labored Psych: calm/pleasant demeanor Neuro: Alert and Oriented x 3, + facial symmetry, speech is clear.   Assessment and Plan: 1. URI, acute Flu test was negative in the office today.  Covid test was sent out and we will call with results.  Advised to continue pushing fluids and rest.  2. Cough He is going to try the [employer] that were prescribed to him yesterday.  3. Acute streptococcal pharyngitis Surprisingly his rapid test was positive in the office.  I will treat with amoxicillin.  He may take Tylenol as needed and again continue pushing fluids.   Follow Up Instructions:    I discussed the assessment and treatment plan with the patient. The patient was provided an opportunity to ask questions and all were answered. The patient agreed with the plan and demonstrated an understanding of the instructions.  This visit occurred during the SARS-CoV-2 public health emergency.  Safety protocols  were in place, including screening questions prior to the visit, additional usage of staff PPE, and extensive cleaning of exam room while observing appropriate contact time as indicated for disinfecting solutions.    Cullen Lahaie M Sharleen Szczesny, PA-C

## 2020-05-27 ENCOUNTER — Telehealth: Payer: Self-pay

## 2020-05-27 ENCOUNTER — Other Ambulatory Visit: Payer: Self-pay

## 2020-05-27 DIAGNOSIS — M25552 Pain in left hip: Secondary | ICD-10-CM

## 2020-05-27 LAB — NOVEL CORONAVIRUS, NAA: SARS-CoV-2, NAA: NOT DETECTED

## 2020-05-27 LAB — SARS-COV-2, NAA 2 DAY TAT

## 2020-05-27 NOTE — Telephone Encounter (Signed)
Home health referral placed, called and lm for pt tcb. When pt calls back please let him know he needs to keep his appointment in March with Dr. Yong Channel in order for this to be signed off on.

## 2020-05-27 NOTE — Telephone Encounter (Signed)
-----   Message from Marin Olp, MD sent at 05/26/2020  9:20 PM EST ----- Regarding: RE: Home PT request Alyssa- thanks for caring for him,   I see him in early march. Im ok with home health referral under hip pain (team determine which hip). Indication should be cannot leave home without assist. I see him in march- he has to keep appointment for me to be able to sign off as face to face visit- please emphasize he has to keep that appointment or I cannot sign off on orders.   Im also fine if we have a more acute /closer spot if he wants me to [employer]   Team please make this thread a phone note  ----- Message ----- From: Fredirick Lathe, PA-C Sent: 05/26/2020   4:09 PM EST To: Marin Olp, MD Subject: Home PT request                                Hi Dr. Yong Channel,  Patient was scheduled with me for hip pain today, but there was scheduling error and he ended up being seen in parking lot for upper respiratory symptoms. Daughter is requesting home PT for mobility for him and is still wanting his hip pain addressed, which I could not assess in the car today. Are you able to send referral for PT or do you want to see him for reevaluation?  Thanks, Alyssa A.

## 2020-06-04 NOTE — Progress Notes (Signed)
I, [name], LAT, ATC, am serving as scribe for Dr. [name].  Steven [name] is a 87 y.o. male who presents to [place] at [facility] today for f/u of L hip pain.  He was last seen by Dr. [name] on 07/30/19 for R lateral hip pain radiating down to his R lower leg along the anterior-medial lower let to the ant-med ankle.  He had a R GT injection and was prescribed Gabapentin.  He was also referred for a lumbar spine MRI that he never had.  More recently, pt was seen by primary care on 01/23/20 for L hip pain after suffering a fall on 01/18/20.  Since his last visit w/ Dr. [name], pt reports con't L hip pain since Oct 2021 when he fell.  He reports pain w/ weight-bearing and laying on his L side.  He has been taking Tylenol intermittently.  Diagnostic imaging: R femur XR- 07/30/19; R hip and L-spine XR- 12/09/18  Pertinent review of systems: No fevers or chills  Relevant historical information: Hypertension, diabetes, frequent falls, general poor health.  Poor transportation.  Lives at home.   Exam:  BP 130/80 (BP Location: Left Arm, Patient Position: Sitting, Cuff Size: Normal)   Pulse 69   Ht 5\' 9"  (1.753 m)   SpO2 96%   BMI 22.89 kg/m  General: Well Developed, well nourished, and in no acute distress.   MSK: Left hip normal-appearing Tender palpation greater trochanter.   Intact hip range of motion to external rotation and abduction. Diminished strength abduction and external rotation. Antalgic gait unstable     Lab and Radiology Results  X-ray images left hip obtained today personally independently interpreted.   No acute fractures.  Formal radiology review  Hip greater trochanteric injection: Left lateral hip trochanter bursa Consent obtained and timeout performed. Area of maximum tenderness palpated and identified. Skin cleaned with alcohol, cold spray applied. A 22g needle was used to access the greater trochanteric bursa. 40 mg of Kenalog and 2  mL of Marcaine were used to inject the trochanteric bursa. Patient tolerated the procedure well.    Assessment and Plan: 87 y.o. male with left lateral hip pain due to trochanteric bursitis and hip abductor tendinopathy.  Plan for injection and referral to home health physical therapy.  Recheck back in 1 month.  Patient needs home health PT given transportation difficulty.   PDMP not reviewed this encounter. Orders Placed This Encounter  Procedures  . DG HIP UNILAT W OR W/O PELVIS 2-3 VIEWS LEFT    Standing Status:   Future    Number of Occurrences:   1    Standing Expiration Date:   07/05/2020    Order Specific Question:   Reason for Exam (SYMPTOM  OR DIAGNOSIS REQUIRED)    Answer:   L hip pain    Order Specific Question:   Preferred imaging location?    Answer:   [facility]  . Ambulatory referral to Home Health    Referral Priority:   Routine    Referral Type:   Home Health Care    Referral Reason:   Specialty Services Required    Requested Specialty:   [facility]    Number of Visits Requested:   1   No orders of the defined types were placed in this encounter.    Discussed warning signs or symptoms. Please see discharge instructions. Patient expresses understanding.   The above documentation has been reviewed and is accurate  and complete [name], M.D.

## 2020-06-07 ENCOUNTER — Ambulatory Visit (INDEPENDENT_AMBULATORY_CARE_PROVIDER_SITE_OTHER): Payer: Medicare Other | Admitting: Family Medicine

## 2020-06-07 ENCOUNTER — Encounter: Payer: Self-pay | Admitting: Family Medicine

## 2020-06-07 ENCOUNTER — Other Ambulatory Visit: Payer: Self-pay

## 2020-06-07 ENCOUNTER — Ambulatory Visit (INDEPENDENT_AMBULATORY_CARE_PROVIDER_SITE_OTHER): Payer: Medicare Other

## 2020-06-07 VITALS — BP 130/80 | HR 69 | Ht 69.0 in

## 2020-06-07 DIAGNOSIS — M25552 Pain in left hip: Secondary | ICD-10-CM | POA: Diagnosis not present

## 2020-06-07 NOTE — Patient Instructions (Signed)
Thank you for coming in today.  Please get an Xray today before you leave  Call or go to the ER if you develop a large red swollen joint with extreme pain or oozing puss.   I've referred you to Physical Therapy.  Let us know if you don't hear from them in one week.  Recheck in 1 month.  Let me know if things not going well.

## 2020-06-08 ENCOUNTER — Encounter: Payer: Self-pay | Admitting: Emergency Medicine

## 2020-06-08 ENCOUNTER — Other Ambulatory Visit: Payer: Self-pay

## 2020-06-08 ENCOUNTER — Ambulatory Visit: Payer: Medicare Other | Admitting: Orthotics

## 2020-06-08 DIAGNOSIS — R2681 Unsteadiness on feet: Secondary | ICD-10-CM

## 2020-06-08 DIAGNOSIS — M21371 Foot drop, right foot: Secondary | ICD-10-CM

## 2020-06-08 NOTE — Progress Notes (Signed)
Going to line RIGHT drop foot brace with p-cell to mitigate irritation .   Also cast him for DBS; he will see Dr. Valentina Lucks on [address]

## 2020-06-08 NOTE — Progress Notes (Signed)
Xray hip shows no fracture.

## 2020-06-10 ENCOUNTER — Ambulatory Visit (INDEPENDENT_AMBULATORY_CARE_PROVIDER_SITE_OTHER): Payer: Medicare Other | Admitting: Podiatrist

## 2020-06-10 ENCOUNTER — Other Ambulatory Visit: Payer: Self-pay

## 2020-06-10 DIAGNOSIS — M2142 Flat foot [pes planus] (acquired), left foot: Secondary | ICD-10-CM | POA: Diagnosis not present

## 2020-06-10 DIAGNOSIS — M21371 Foot drop, right foot: Secondary | ICD-10-CM

## 2020-06-10 DIAGNOSIS — M2141 Flat foot [pes planus] (acquired), right foot: Secondary | ICD-10-CM

## 2020-06-10 DIAGNOSIS — R2681 Unsteadiness on feet: Secondary | ICD-10-CM | POA: Diagnosis not present

## 2020-06-10 DIAGNOSIS — E1151 Type 2 diabetes mellitus with diabetic peripheral angiopathy without gangrene: Secondary | ICD-10-CM

## 2020-06-10 NOTE — Progress Notes (Signed)
Chief Complaint  Patient presents with  . foot exam    Foot exam: FBS: pt does not check A1c; 6      HPI: Patient is 87 y.o. male who presents today for the concerns as listed above.   Patient Active Problem List   Diagnosis Date Noted  . BPH (benign prostatic hyperplasia) 09/18/2019  . AAA (abdominal aortic aneurysm) ([place]) 12/13/2018  . Hx of CABG 12/13/2017  . PVC's (premature ventricular contractions) 12/13/2017  . Seborrheic dermatitis 09/13/2017  . Falls frequently 08/21/2017  . Senile purpura ([place]) 08/21/2017  . Aortic root dilatation ([place]) 10/06/2016  . History of acute inferior wall MI 09/03/2015  . Osteoporosis 01/07/2015  . Anemia 09/03/2013  . Essential hypertension, benign 10/11/2012  . Foot drop, left 08/23/2012  . History of supraventricular tachycardia 01/25/2011  . Hypogonadism in male 02/04/2010  . History of colonic polyps 09/07/2009  . CAD, ARTERY BYPASS GRAFT 05/21/2009  . Carotid artery stenosis 04/20/2008  . Diabetes mellitus type II, controlled ([place]) 04/19/2007  . Hyperlipidemia 04/19/2007  . GERD 04/19/2007    Current Outpatient Medications on File Prior to Visit  Medication Sig Dispense Refill  . acetaminophen (TYLENOL) 500 MG tablet Take 500 mg by mouth every 6 (six) hours as needed. Up to 6 tabs (3gm) qd    . alendronate (FOSAMAX) 70 MG tablet TAKE 1 TABLET BY MOUTH EVERY 7 DAYS WITH A FULL GLASS OF WATER ON AN EMPTY STOMACH 12 tablet 4  . aspirin 81 MG tablet Take 81 mg by mouth daily.    Marland Kitchen atorvastatin (LIPITOR) 80 MG tablet TAKE 1 TABLET DAILY. 90 tablet 3  . benzonatate (TESSALON PERLES) 100 MG capsule Take 1 capsule (100 mg total) by mouth 3 (three) times daily as needed. (Patient not taking: No sig reported) 20 capsule 0  . cycloSPORINE (RESTASIS) 0.05 % ophthalmic emulsion 1 drop 2 (two) times daily.    . diclofenac sodium (VOLTAREN) 1 % GEL Apply 4 g topically 4 (four) times daily. (Patient taking differently: Apply 4 g topically in  the morning and at bedtime.) 100 g 1  . diltiazem (MATZIM LA) 180 MG 24 hr tablet Take 1 tablet (180 mg total) by mouth daily. 90 tablet 1  . glimepiride (AMARYL) 1 MG tablet Take 1 tablet (1 mg total) by mouth daily with breakfast. 90 tablet 3  . glucose blood test strip Use to test your blood sugar daily. E11.9 One touch Verio test strips 100 each 12  . hydrocortisone 2.5 % cream APPLICATIONS APPLY ON THE SKIN EVERY DAY    . Lancets (ONETOUCH ULTRASOFT) lancets USE TO TEST BLOOD SUGARS DAILY. 100 each 3  . lisinopril (ZESTRIL) 2.5 MG tablet Take a half tab daily. 45 tablet 3  . metFORMIN (GLUCOPHAGE) 1000 MG tablet TAKE 1 TABLET (1,000 MG TOTAL) BY MOUTH 2 (TWO) TIMES DAILY WITH A MEAL. 180 tablet 3  . metoprolol succinate (TOPROL-XL) 50 MG 24 hr tablet TAKE 1 TABLET BY MOUTH EVERY DAY 90 tablet 1  . mupirocin ointment (BACTROBAN) 2 % Apply to affected area 1-2 times daily 22 g 0  . omeprazole (PRILOSEC) 20 MG capsule Take 1 capsule (20 mg total) by mouth daily. (Patient taking differently: Take 20 mg by mouth as needed.) 90 capsule 3  . ONETOUCH VERIO test strip USE TO TEST BLOOD SUGARS DAILY. 100 each 3  . traMADol (ULTRAM) 50 MG tablet Take 1 tablet (50 mg total) by mouth every 8 (eight) hours as needed.  60 tablet 1  . [DISCONTINUED] testosterone cypionate (DEPO-TESTOSTERONE) 200 MG/ML injection Inject 1 mL (200 mg total) into the muscle every 30 (thirty) days. 10 mL 0   No current facility-administered medications on file prior to visit.    No Known Allergies  Review of Systems No fevers, chills, nausea, muscle aches, no difficulty breathing, no calf pain, no chest pain or shortness of breath.   Physical Exam  GENERAL APPEARANCE: Alert, conversant. Appropriately groomed. No acute distress.   VASCULAR: Pedal pulses palpable 1/4 DP and 0/4 PT bilateral.  Capillary refill time is less than 3 seconds to all digits,  Proximal to distal cooling is cool to cool with a purple discoloration  of skin of feet and legs.  Digital hair growth absent bilateral.   NEUROLOGIC: sensation is decreased from the metatarsal heads distally to 5.07 monofilament at 1/5 sites intact bilateral.  Light touch is intact bilateral, vibratory sensation absent  bilateral  MUSCULOSKELETAL: decreased muscle strength in the anterior muscle group of the right leg.  Plantarflexors are normal-  and inverters/everters are mildly decreased bilaterally as well. Marland Kitchen Equinus is noted bilateral.  Pes planus foot type noted.   DERMATOLOGIC: skin is warm, supple, and dry.  No open lesions noted.  No rash, no pre ulcerative lesions. Digital nails are thick, discolored, dystrophic, brittle with subungual debris present and clinically mycotic x 10.       Assessment    ICD-10-CM   1. Foot drop, right foot  M21.371   2. Gait instability  R26.81   3. Diabetes mellitus with peripheral vascular disease (HCC)  E11.51   4. Pes planus of both feet  M21.41    M21.42     .  Plan  Due to the patients diabetes with both vascular and neurologic manifestations, diabetic shoes and inserts recommended.  A dropfoot brace is also recommended for the right foot.  Patient was given information on diabetes and foot care.  He was also recommended to be seen at routine intervals to have his nails trimmed to to his risk of complications.

## 2020-06-14 ENCOUNTER — Encounter: Payer: Self-pay | Admitting: Podiatrist

## 2020-06-23 NOTE — Progress Notes (Signed)
Phone [phone] In person visit   Subjective:   Steven [name] is a 87 y.o. year old very pleasant male patient who presents for/with See problem oriented charting Chief Complaint  Patient presents with  . Diabetes    Taking medication  Not taking metformin at night due to gi problems    This visit occurred during the SARS-CoV-2 public health emergency.  Safety protocols were in place, including screening questions prior to the visit, additional usage of staff PPE, and extensive cleaning of exam room while observing appropriate contact time as indicated for disinfecting solutions.   Past Medical History-  Patient Active Problem List   Diagnosis Date Noted  . AAA (abdominal aortic aneurysm) ([place]) 12/13/2018    Priority: High  . Falls frequently 08/21/2017    Priority: High  . Aortic root dilatation ([place]) 10/06/2016    Priority: High  . Osteoporosis 01/07/2015    Priority: High  . CAD, ARTERY BYPASS GRAFT 05/21/2009    Priority: High  . Carotid artery stenosis 04/20/2008    Priority: High  . Diabetes mellitus type II, controlled ([place]) 04/19/2007    Priority: High  . BPH (benign prostatic hyperplasia) 09/18/2019    Priority: Medium  . Anemia 09/03/2013    Priority: Medium  . Essential hypertension, benign 10/11/2012    Priority: Medium  . Hyperlipidemia 04/19/2007    Priority: Medium  . Seborrheic dermatitis 09/13/2017    Priority: Low  . Senile purpura ([place]) 08/21/2017    Priority: Low  . History of acute inferior wall MI 09/03/2015    Priority: Low  . Foot drop, left 08/23/2012    Priority: Low  . History of supraventricular tachycardia 01/25/2011    Priority: Low  . Hypogonadism in male 02/04/2010    Priority: Low  . History of colonic polyps 09/07/2009    Priority: Low  . GERD 04/19/2007    Priority: Low  . Hx of CABG 12/13/2017  . PVC's (premature ventricular contractions) 12/13/2017    Medications- reviewed and updated Current Outpatient Medications   Medication Sig Dispense Refill  . acetaminophen (TYLENOL) 500 MG tablet Take 500 mg by mouth every 6 (six) hours as needed. Up to 6 tabs (3gm) qd    . aspirin 81 MG tablet Take 81 mg by mouth daily.    Marland Kitchen atorvastatin (LIPITOR) 80 MG tablet TAKE 1 TABLET DAILY. 90 tablet 3  . blood glucose meter kit and supplies KIT Dispense based on patient and insurance preference. Use up to twice daily as directed. E11.9 1 each 0  . cycloSPORINE (RESTASIS) 0.05 % ophthalmic emulsion 1 drop 2 (two) times daily.    . diclofenac sodium (VOLTAREN) 1 % GEL Apply 4 g topically 4 (four) times daily. (Patient taking differently: Apply 4 g topically in the morning and at bedtime.) 100 g 1  . diltiazem (MATZIM LA) 180 MG 24 hr tablet Take 1 tablet (180 mg total) by mouth daily. 90 tablet 1  . glimepiride (AMARYL) 1 MG tablet Take 1 tablet (1 mg total) by mouth daily with breakfast. 90 tablet 3  . glucose blood test strip Use to test your blood sugar daily. E11.9 One touch Verio test strips 100 each 12  . hydrocortisone 2.5 % cream APPLICATIONS APPLY ON THE SKIN EVERY DAY    . Lancets (ONETOUCH ULTRASOFT) lancets USE TO TEST BLOOD SUGARS DAILY. 100 each 3  . lisinopril (ZESTRIL) 2.5 MG tablet Take a half tab daily. 45 tablet 3  . metFORMIN (GLUCOPHAGE) 1000  MG tablet TAKE 1 TABLET (1,000 MG TOTAL) BY MOUTH 2 (TWO) TIMES DAILY WITH A MEAL. 180 tablet 3  . metoprolol succinate (TOPROL-XL) 50 MG 24 hr tablet TAKE 1 TABLET BY MOUTH EVERY DAY 90 tablet 1  . mupirocin ointment (BACTROBAN) 2 % Apply to affected area 1-2 times daily 22 g 0  . omeprazole (PRILOSEC) 20 MG capsule Take 1 capsule (20 mg total) by mouth daily. (Patient taking differently: Take 20 mg by mouth as needed.) 90 capsule 3  . ONETOUCH VERIO test strip USE TO TEST BLOOD SUGARS DAILY. 100 each 3  . traMADol (ULTRAM) 50 MG tablet Take 1 tablet (50 mg total) by mouth every 8 (eight) hours as needed. 60 tablet 1   No current facility-administered medications  for this visit.     Objective:  BP 134/63   Pulse 65   Temp 98.2 F (36.8 C) (Temporal)   Ht $R'5\' 9"'ow$  (1.753 m)   SpO2 98%   BMI 22.89 kg/m  Gen: NAD, resting comfortably CV: RRR no murmurs rubs or gallops Lungs: CTAB no crackles, wheeze, rhonchi Abdomen: soft/nontender/nondistended/normal bowel sounds. Ext: no edema Skin: warm, dry Neuro: wheelchair bound    Assessment and Plan   # Diabetes S:  controlled on Amaryl $RemoveB'1mg'MHbyhZFB$ , metformin 1000 mg twice a day previously-has not been taking Metformin at night due to GI upset CBGs- not checking sugar Exercise and diet- exercise limited by pains. Encouraged healthy diet Lab Results  Component Value Date   HGBA1C 6.3 (H) 11/18/2019   HGBA1C 6.5 08/05/2019   HGBA1C 6.5 11/28/2018  A/P: Hopefully well controlled-if not may change to Metformin extended release -reports having a lot of junk food with his caregiver- hoping #s havent trended up  #Stenosis of right carotid artery S: October 10, 2018 moderate right and mild left carotid artery stenosis with plan to repeat in 1 year per Dr. Claiborne Billings- A/P: Has not been checked recently-we ordered repeat carotid duplex today  #Atherosclerosis of coronary artery bypass graft of native heart without angina pectoris S: Patient follows regularly with Dr. Debara Pickett (needs to schedule).  He is compliant with aspirin, statin, metoprolol.  CABG in 2004.  Today reports no cp or sob recently  A/P: Stable. Continue current medications.    Aortic root dilatation (HCC) S: Last imaged October 19, 2016 through Dr. Maudie Flakes aortic root size at that time.  On imaging 04/12/2020 the aortic root was noted as normal A/P: improved on last check- will follow up with future echocadiograms through cardiology   Abdominal aortic aneurysm (AAA) without rupture [facility]) S: August 2020 showed distal aortic aneurysm 4.4 cm.  Stable May 2021.  Yearly follow-up needed.  Found incidentally during other work-up A/P: Stable on most recent  check-consider repeat at next visit if cardiology does not order  Mixed hyperlipidemia S: Patient is compliant with atorvastatin 80 mg.  LDL goal under 70 Lab Results  Component Value Date   CHOL 130 08/05/2019   HDL 41.00 08/05/2019   LDLCALC 69 08/05/2019   LDLDIRECT 74.0 02/26/2018   TRIG 101.0 08/05/2019   CHOLHDL 3 08/05/2019   A/P: Well-controlled on last check-continue current medications.  Recheck for lipid panel next visit  Essential hypertension, benign S: Compliant with metoprolol 50 mg extended release, diltiazem 180 mg extended release, lisinopril 2.5 mg BP Readings from Last 3 Encounters:  06/24/20 134/63  06/07/20 130/80  05/26/20 130/70   A/P:   Stable. Continue current medications.    #Osteoporosis S: Patient is compliant  with Fosamax- but had bad reflux. on calcium, vitamin D A/P: Patient is overdue for bone density-this was ordered today. Consider reclast- he is open to this  #Orthopedic issues including back,  hip, knee, ankle-tramadol helpful to ease pain-primarily for hip from my perspective. Hip injection was not helpful most recently- has follow up with Dr. [name] 07/05/20  #senile purpura- easy bruising/bleeding on aspirin noted   Recommended follow up: No follow-ups on file. Future Appointments  Date Time Provider [place]  07/05/2020 11:30 AM Gregor Hams, MD [facility] None    Lab/Order associations:   ICD-10-CM   1. Controlled type 2 diabetes mellitus without complication, without long-term current use of insulin (HCC)  E11.9 Hemoglobin A1c    Comprehensive metabolic panel    blood glucose meter kit and supplies KIT  2. Stenosis of right carotid artery  I65.21 VAS US CAROTID  3. Age-related osteoporosis without current pathological fracture  M81.0 DG Bone Density  4. Atherosclerosis of coronary artery bypass graft of native heart without angina pectoris  I25.810   5. Aortic root dilatation (HCC)  I77.810   6. Abdominal aortic aneurysm  (AAA) without rupture (HCC)  I71.4   7. Mixed hyperlipidemia  E78.2   8. Essential hypertension, benign  I10   9. Senile purpura (HCC)  D69.2     Meds ordered this encounter  Medications  . blood glucose meter kit and supplies KIT    Sig: Dispense based on patient and insurance preference. Use up to twice daily as directed. E11.9    Dispense:  1 each    Refill:  0    Order Specific Question:   Number of strips    Answer:   100    Order Specific Question:   Number of lancets    Answer:   100      Return precautions advised.  [name], MD

## 2020-06-23 NOTE — Patient Instructions (Addendum)
Health Maintenance Due  Topic Date Due  . OPHTHALMOLOGY EXAM - Sign release of information at the check out desk for last diabetic eye exam  12/04/2019  . COVID-19 Vaccine (3 - Booster for [employer] series)- let us know date of covid booster (we would recommend this 02/03/2020  . DEXA SCAN   Schedule your bone density test at check out desk. You may also call directly to X-ray at [phone] to schedule an appointment that is convenient for you.  - located [address] across the street from [place] - in the basement - you do need an appointment for the bone density tests.    04/05/2020   Please stop by lab before you go If you have mychart- we will send your results within 3 business days of [place] receiving them.  If you do not have mychart- we will call you about results within 5 business days of [place] receiving them.  *please also note that you will see labs on mychart as soon as they post. I will later go in and write notes on them- will say "notes from Dr. Yong Channel"  We will call you within two weeks about your referral to carotid artery check. If you do not hear within 2 weeks, give [place] a call.

## 2020-06-24 ENCOUNTER — Other Ambulatory Visit: Payer: Self-pay

## 2020-06-24 ENCOUNTER — Encounter: Payer: Self-pay | Admitting: Family Medicine

## 2020-06-24 ENCOUNTER — Ambulatory Visit (INDEPENDENT_AMBULATORY_CARE_PROVIDER_SITE_OTHER): Payer: Medicare Other | Admitting: Family Medicine

## 2020-06-24 VITALS — BP 134/63 | HR 65 | Temp 98.2°F | Ht 69.0 in

## 2020-06-24 DIAGNOSIS — I25810 Atherosclerosis of coronary artery bypass graft(s) without angina pectoris: Secondary | ICD-10-CM | POA: Diagnosis not present

## 2020-06-24 DIAGNOSIS — D692 Other nonthrombocytopenic purpura: Secondary | ICD-10-CM | POA: Diagnosis not present

## 2020-06-24 DIAGNOSIS — I77810 Thoracic aortic ectasia: Secondary | ICD-10-CM

## 2020-06-24 DIAGNOSIS — E782 Mixed hyperlipidemia: Secondary | ICD-10-CM | POA: Diagnosis not present

## 2020-06-24 DIAGNOSIS — I6521 Occlusion and stenosis of right carotid artery: Secondary | ICD-10-CM | POA: Diagnosis not present

## 2020-06-24 DIAGNOSIS — M810 Age-related osteoporosis without current pathological fracture: Secondary | ICD-10-CM

## 2020-06-24 DIAGNOSIS — I714 Abdominal aortic aneurysm, without rupture, unspecified: Secondary | ICD-10-CM

## 2020-06-24 DIAGNOSIS — E119 Type 2 diabetes mellitus without complications: Secondary | ICD-10-CM

## 2020-06-24 DIAGNOSIS — I10 Essential (primary) hypertension: Secondary | ICD-10-CM

## 2020-06-24 MED ORDER — BLOOD GLUCOSE MONITOR KIT: PACK | 0 refills | Status: DC

## 2020-06-25 ENCOUNTER — Other Ambulatory Visit: Payer: Self-pay

## 2020-06-25 LAB — COMPREHENSIVE METABOLIC PANEL
ALT: 14 U/L (ref 0–53)
AST: 13 U/L (ref 0–37)
Albumin: 3.8 g/dL (ref 3.5–5.2)
Alkaline Phosphatase: 63 U/L (ref 39–117)
BUN: 29 mg/dL — ABNORMAL HIGH (ref 6–23)
CO2: 22 mEq/L (ref 19–32)
Calcium: 10 mg/dL (ref 8.4–10.5)
Chloride: 107 mEq/L (ref 96–112)
Creatinine, Ser: 1.19 mg/dL (ref 0.40–1.50)
GFR: 55.02 mL/min — ABNORMAL LOW (ref >60.00)
Glucose, Bld: 67 mg/dL — ABNORMAL LOW (ref 70–99)
Potassium: 4.6 mEq/L (ref 3.5–5.1)
Sodium: 139 mEq/L (ref 135–145)
Total Bilirubin: 0.4 mg/dL (ref 0.2–1.2)
Total Protein: 6.6 g/dL (ref 6.0–8.3)

## 2020-06-25 LAB — HEMOGLOBIN A1C: Hgb A1c MFr Bld: 6.2 % (ref 4.6–6.5)

## 2020-06-29 DIAGNOSIS — I6521 Occlusion and stenosis of right carotid artery: Secondary | ICD-10-CM | POA: Diagnosis not present

## 2020-06-29 DIAGNOSIS — M419 Scoliosis, unspecified: Secondary | ICD-10-CM | POA: Diagnosis not present

## 2020-06-29 DIAGNOSIS — I252 Old myocardial infarction: Secondary | ICD-10-CM | POA: Diagnosis not present

## 2020-06-29 DIAGNOSIS — E119 Type 2 diabetes mellitus without complications: Secondary | ICD-10-CM | POA: Diagnosis not present

## 2020-06-29 DIAGNOSIS — D649 Anemia, unspecified: Secondary | ICD-10-CM | POA: Diagnosis not present

## 2020-06-29 DIAGNOSIS — M810 Age-related osteoporosis without current pathological fracture: Secondary | ICD-10-CM | POA: Diagnosis not present

## 2020-06-29 DIAGNOSIS — G8929 Other chronic pain: Secondary | ICD-10-CM | POA: Diagnosis not present

## 2020-06-29 DIAGNOSIS — K219 Gastro-esophageal reflux disease without esophagitis: Secondary | ICD-10-CM | POA: Diagnosis not present

## 2020-06-29 DIAGNOSIS — M21371 Foot drop, right foot: Secondary | ICD-10-CM | POA: Diagnosis not present

## 2020-06-29 DIAGNOSIS — Z9181 History of falling: Secondary | ICD-10-CM | POA: Diagnosis not present

## 2020-06-29 DIAGNOSIS — D692 Other nonthrombocytopenic purpura: Secondary | ICD-10-CM | POA: Diagnosis not present

## 2020-06-29 DIAGNOSIS — I2510 Atherosclerotic heart disease of native coronary artery without angina pectoris: Secondary | ICD-10-CM | POA: Diagnosis not present

## 2020-06-29 DIAGNOSIS — E785 Hyperlipidemia, unspecified: Secondary | ICD-10-CM | POA: Diagnosis not present

## 2020-06-29 DIAGNOSIS — M7062 Trochanteric bursitis, left hip: Secondary | ICD-10-CM | POA: Diagnosis not present

## 2020-06-29 DIAGNOSIS — I10 Essential (primary) hypertension: Secondary | ICD-10-CM | POA: Diagnosis not present

## 2020-06-29 DIAGNOSIS — I714 Abdominal aortic aneurysm, without rupture: Secondary | ICD-10-CM | POA: Diagnosis not present

## 2020-06-29 DIAGNOSIS — Z7984 Long term (current) use of oral hypoglycemic drugs: Secondary | ICD-10-CM | POA: Diagnosis not present

## 2020-06-29 DIAGNOSIS — Z86010 Personal history of colonic polyps: Secondary | ICD-10-CM | POA: Diagnosis not present

## 2020-06-30 ENCOUNTER — Telehealth: Payer: Self-pay

## 2020-06-30 NOTE — Telephone Encounter (Signed)
Is it okay for me to give the verbal ?

## 2020-06-30 NOTE — Telephone Encounter (Signed)
Flor, Physical therapist from [facility] called requesting verbal orders for PT for pt. Requesting orders to see pt for 2 x 5 weeks. Also requesting an order to OT for evaluation. Please advise.

## 2020-07-01 NOTE — Telephone Encounter (Signed)
Called and lm for [employer].

## 2020-07-01 NOTE — Telephone Encounter (Signed)
Did give verbal orders.

## 2020-07-01 NOTE — Telephone Encounter (Signed)
Yes thanks 

## 2020-07-01 NOTE — Telephone Encounter (Signed)
Unable to reach [facility]. [place]

## 2020-07-02 NOTE — Progress Notes (Deleted)
   I, [name], LAT, ATC acting as a scribe for [name], MD.  Steven [name] is a 87 y.o. male who presents to [place] at [facility] today for f/u L hip pain ongoing since Oct 2021 when he suffered a fall on 01/18/20. Pt was last seen by Dr. [name] on 06/07/20 and was given a L GT steroid injection and referred to home health PT. Today, pt reports  Dx imaging: 06/07/20 L hip/pelvis XR  12/09/18 L-spine XR  Pertinent review of systems: ***  Relevant historical information: ***   Exam:  There were no vitals taken for this visit. General: Well Developed, well nourished, and in no acute distress.   MSK: ***    Lab and Radiology Results No results found for this or any previous visit (from the past 72 hour(s)). No results found.     Assessment and Plan: 87 y.o. male with ***   PDMP not reviewed this encounter. No orders of the defined types were placed in this encounter.  No orders of the defined types were placed in this encounter.    Discussed warning signs or symptoms. Please see discharge instructions. Patient expresses understanding.   ***

## 2020-07-05 ENCOUNTER — Ambulatory Visit: Payer: Medicare Other | Admitting: Family Medicine

## 2020-07-06 DIAGNOSIS — Z9181 History of falling: Secondary | ICD-10-CM | POA: Diagnosis not present

## 2020-07-06 DIAGNOSIS — I714 Abdominal aortic aneurysm, without rupture: Secondary | ICD-10-CM | POA: Diagnosis not present

## 2020-07-06 DIAGNOSIS — I6521 Occlusion and stenosis of right carotid artery: Secondary | ICD-10-CM | POA: Diagnosis not present

## 2020-07-06 DIAGNOSIS — M810 Age-related osteoporosis without current pathological fracture: Secondary | ICD-10-CM | POA: Diagnosis not present

## 2020-07-06 DIAGNOSIS — I2510 Atherosclerotic heart disease of native coronary artery without angina pectoris: Secondary | ICD-10-CM | POA: Diagnosis not present

## 2020-07-06 DIAGNOSIS — D649 Anemia, unspecified: Secondary | ICD-10-CM | POA: Diagnosis not present

## 2020-07-06 DIAGNOSIS — M7062 Trochanteric bursitis, left hip: Secondary | ICD-10-CM | POA: Diagnosis not present

## 2020-07-06 DIAGNOSIS — G8929 Other chronic pain: Secondary | ICD-10-CM | POA: Diagnosis not present

## 2020-07-06 DIAGNOSIS — K219 Gastro-esophageal reflux disease without esophagitis: Secondary | ICD-10-CM | POA: Diagnosis not present

## 2020-07-06 DIAGNOSIS — D692 Other nonthrombocytopenic purpura: Secondary | ICD-10-CM | POA: Diagnosis not present

## 2020-07-06 DIAGNOSIS — Z7984 Long term (current) use of oral hypoglycemic drugs: Secondary | ICD-10-CM | POA: Diagnosis not present

## 2020-07-06 DIAGNOSIS — Z86010 Personal history of colonic polyps: Secondary | ICD-10-CM | POA: Diagnosis not present

## 2020-07-06 DIAGNOSIS — I252 Old myocardial infarction: Secondary | ICD-10-CM | POA: Diagnosis not present

## 2020-07-06 DIAGNOSIS — M419 Scoliosis, unspecified: Secondary | ICD-10-CM | POA: Diagnosis not present

## 2020-07-06 DIAGNOSIS — I10 Essential (primary) hypertension: Secondary | ICD-10-CM | POA: Diagnosis not present

## 2020-07-06 DIAGNOSIS — M21371 Foot drop, right foot: Secondary | ICD-10-CM | POA: Diagnosis not present

## 2020-07-06 DIAGNOSIS — E119 Type 2 diabetes mellitus without complications: Secondary | ICD-10-CM | POA: Diagnosis not present

## 2020-07-06 DIAGNOSIS — E785 Hyperlipidemia, unspecified: Secondary | ICD-10-CM | POA: Diagnosis not present

## 2020-07-08 DIAGNOSIS — D649 Anemia, unspecified: Secondary | ICD-10-CM | POA: Diagnosis not present

## 2020-07-08 DIAGNOSIS — M419 Scoliosis, unspecified: Secondary | ICD-10-CM | POA: Diagnosis not present

## 2020-07-08 DIAGNOSIS — Z7984 Long term (current) use of oral hypoglycemic drugs: Secondary | ICD-10-CM | POA: Diagnosis not present

## 2020-07-08 DIAGNOSIS — I6521 Occlusion and stenosis of right carotid artery: Secondary | ICD-10-CM | POA: Diagnosis not present

## 2020-07-08 DIAGNOSIS — I714 Abdominal aortic aneurysm, without rupture: Secondary | ICD-10-CM | POA: Diagnosis not present

## 2020-07-08 DIAGNOSIS — G8929 Other chronic pain: Secondary | ICD-10-CM | POA: Diagnosis not present

## 2020-07-08 DIAGNOSIS — D692 Other nonthrombocytopenic purpura: Secondary | ICD-10-CM | POA: Diagnosis not present

## 2020-07-08 DIAGNOSIS — M21371 Foot drop, right foot: Secondary | ICD-10-CM | POA: Diagnosis not present

## 2020-07-08 DIAGNOSIS — I2510 Atherosclerotic heart disease of native coronary artery without angina pectoris: Secondary | ICD-10-CM | POA: Diagnosis not present

## 2020-07-08 DIAGNOSIS — I10 Essential (primary) hypertension: Secondary | ICD-10-CM | POA: Diagnosis not present

## 2020-07-08 DIAGNOSIS — E119 Type 2 diabetes mellitus without complications: Secondary | ICD-10-CM | POA: Diagnosis not present

## 2020-07-08 DIAGNOSIS — K219 Gastro-esophageal reflux disease without esophagitis: Secondary | ICD-10-CM | POA: Diagnosis not present

## 2020-07-08 DIAGNOSIS — Z86010 Personal history of colonic polyps: Secondary | ICD-10-CM | POA: Diagnosis not present

## 2020-07-08 DIAGNOSIS — M7062 Trochanteric bursitis, left hip: Secondary | ICD-10-CM | POA: Diagnosis not present

## 2020-07-08 DIAGNOSIS — E785 Hyperlipidemia, unspecified: Secondary | ICD-10-CM | POA: Diagnosis not present

## 2020-07-08 DIAGNOSIS — Z9181 History of falling: Secondary | ICD-10-CM | POA: Diagnosis not present

## 2020-07-08 DIAGNOSIS — M810 Age-related osteoporosis without current pathological fracture: Secondary | ICD-10-CM | POA: Diagnosis not present

## 2020-07-08 DIAGNOSIS — I252 Old myocardial infarction: Secondary | ICD-10-CM | POA: Diagnosis not present

## 2020-07-09 DIAGNOSIS — I2510 Atherosclerotic heart disease of native coronary artery without angina pectoris: Secondary | ICD-10-CM | POA: Diagnosis not present

## 2020-07-09 DIAGNOSIS — I252 Old myocardial infarction: Secondary | ICD-10-CM | POA: Diagnosis not present

## 2020-07-09 DIAGNOSIS — D649 Anemia, unspecified: Secondary | ICD-10-CM | POA: Diagnosis not present

## 2020-07-09 DIAGNOSIS — I714 Abdominal aortic aneurysm, without rupture: Secondary | ICD-10-CM | POA: Diagnosis not present

## 2020-07-09 DIAGNOSIS — Z7984 Long term (current) use of oral hypoglycemic drugs: Secondary | ICD-10-CM | POA: Diagnosis not present

## 2020-07-09 DIAGNOSIS — M810 Age-related osteoporosis without current pathological fracture: Secondary | ICD-10-CM | POA: Diagnosis not present

## 2020-07-09 DIAGNOSIS — D692 Other nonthrombocytopenic purpura: Secondary | ICD-10-CM | POA: Diagnosis not present

## 2020-07-09 DIAGNOSIS — E119 Type 2 diabetes mellitus without complications: Secondary | ICD-10-CM | POA: Diagnosis not present

## 2020-07-09 DIAGNOSIS — K219 Gastro-esophageal reflux disease without esophagitis: Secondary | ICD-10-CM | POA: Diagnosis not present

## 2020-07-09 DIAGNOSIS — I6521 Occlusion and stenosis of right carotid artery: Secondary | ICD-10-CM | POA: Diagnosis not present

## 2020-07-09 DIAGNOSIS — M419 Scoliosis, unspecified: Secondary | ICD-10-CM | POA: Diagnosis not present

## 2020-07-09 DIAGNOSIS — I10 Essential (primary) hypertension: Secondary | ICD-10-CM | POA: Diagnosis not present

## 2020-07-09 DIAGNOSIS — M7062 Trochanteric bursitis, left hip: Secondary | ICD-10-CM | POA: Diagnosis not present

## 2020-07-09 DIAGNOSIS — M21371 Foot drop, right foot: Secondary | ICD-10-CM | POA: Diagnosis not present

## 2020-07-09 DIAGNOSIS — Z86010 Personal history of colonic polyps: Secondary | ICD-10-CM | POA: Diagnosis not present

## 2020-07-09 DIAGNOSIS — E785 Hyperlipidemia, unspecified: Secondary | ICD-10-CM | POA: Diagnosis not present

## 2020-07-09 DIAGNOSIS — Z9181 History of falling: Secondary | ICD-10-CM | POA: Diagnosis not present

## 2020-07-09 DIAGNOSIS — G8929 Other chronic pain: Secondary | ICD-10-CM | POA: Diagnosis not present

## 2020-07-13 ENCOUNTER — Inpatient Hospital Stay (HOSPITAL_COMMUNITY): Admission: RE | Admit: 2020-07-13 | Payer: Medicare Other | Source: Ambulatory Visit

## 2020-07-15 ENCOUNTER — Telehealth: Payer: Self-pay | Admitting: Podiatry

## 2020-07-15 NOTE — Telephone Encounter (Addendum)
pts daughter Huie Ghuman left message checking on status of diabetic shoes and brace.   I returned call and left message that we did get the brace in but the documents that are required by medicare have not been completed correctly per medicare guidelines and I did fax back on 3.23 with a note asking for it to be resigned and faxed to me directly and have not heard back. I asked pt to call if any further questions.  pts daughter called back and will contact the pcp office and Asked if she could pick up the brace since pt is in physical therapy and the brace will help with that. I confirmed with Dr Valentina Lucks and she said it was ok for pts daughter to pick up the brace.

## 2020-07-19 DIAGNOSIS — I252 Old myocardial infarction: Secondary | ICD-10-CM | POA: Diagnosis not present

## 2020-07-19 DIAGNOSIS — I10 Essential (primary) hypertension: Secondary | ICD-10-CM | POA: Diagnosis not present

## 2020-07-19 DIAGNOSIS — M7062 Trochanteric bursitis, left hip: Secondary | ICD-10-CM | POA: Diagnosis not present

## 2020-07-19 DIAGNOSIS — I714 Abdominal aortic aneurysm, without rupture: Secondary | ICD-10-CM | POA: Diagnosis not present

## 2020-07-19 DIAGNOSIS — E785 Hyperlipidemia, unspecified: Secondary | ICD-10-CM | POA: Diagnosis not present

## 2020-07-19 DIAGNOSIS — K219 Gastro-esophageal reflux disease without esophagitis: Secondary | ICD-10-CM | POA: Diagnosis not present

## 2020-07-19 DIAGNOSIS — I6521 Occlusion and stenosis of right carotid artery: Secondary | ICD-10-CM | POA: Diagnosis not present

## 2020-07-19 DIAGNOSIS — M419 Scoliosis, unspecified: Secondary | ICD-10-CM | POA: Diagnosis not present

## 2020-07-19 DIAGNOSIS — Z7984 Long term (current) use of oral hypoglycemic drugs: Secondary | ICD-10-CM | POA: Diagnosis not present

## 2020-07-19 DIAGNOSIS — I2510 Atherosclerotic heart disease of native coronary artery without angina pectoris: Secondary | ICD-10-CM | POA: Diagnosis not present

## 2020-07-19 DIAGNOSIS — G8929 Other chronic pain: Secondary | ICD-10-CM | POA: Diagnosis not present

## 2020-07-19 DIAGNOSIS — M21371 Foot drop, right foot: Secondary | ICD-10-CM | POA: Diagnosis not present

## 2020-07-19 DIAGNOSIS — Z9181 History of falling: Secondary | ICD-10-CM | POA: Diagnosis not present

## 2020-07-19 DIAGNOSIS — M810 Age-related osteoporosis without current pathological fracture: Secondary | ICD-10-CM | POA: Diagnosis not present

## 2020-07-19 DIAGNOSIS — D649 Anemia, unspecified: Secondary | ICD-10-CM | POA: Diagnosis not present

## 2020-07-19 DIAGNOSIS — E119 Type 2 diabetes mellitus without complications: Secondary | ICD-10-CM | POA: Diagnosis not present

## 2020-07-19 DIAGNOSIS — Z86010 Personal history of colonic polyps: Secondary | ICD-10-CM | POA: Diagnosis not present

## 2020-07-19 DIAGNOSIS — D692 Other nonthrombocytopenic purpura: Secondary | ICD-10-CM | POA: Diagnosis not present

## 2020-07-21 DIAGNOSIS — I714 Abdominal aortic aneurysm, without rupture: Secondary | ICD-10-CM | POA: Diagnosis not present

## 2020-07-21 DIAGNOSIS — Z9181 History of falling: Secondary | ICD-10-CM | POA: Diagnosis not present

## 2020-07-21 DIAGNOSIS — G8929 Other chronic pain: Secondary | ICD-10-CM | POA: Diagnosis not present

## 2020-07-21 DIAGNOSIS — E785 Hyperlipidemia, unspecified: Secondary | ICD-10-CM | POA: Diagnosis not present

## 2020-07-21 DIAGNOSIS — I252 Old myocardial infarction: Secondary | ICD-10-CM | POA: Diagnosis not present

## 2020-07-21 DIAGNOSIS — M21371 Foot drop, right foot: Secondary | ICD-10-CM | POA: Diagnosis not present

## 2020-07-21 DIAGNOSIS — K219 Gastro-esophageal reflux disease without esophagitis: Secondary | ICD-10-CM | POA: Diagnosis not present

## 2020-07-21 DIAGNOSIS — D649 Anemia, unspecified: Secondary | ICD-10-CM | POA: Diagnosis not present

## 2020-07-21 DIAGNOSIS — I2510 Atherosclerotic heart disease of native coronary artery without angina pectoris: Secondary | ICD-10-CM | POA: Diagnosis not present

## 2020-07-21 DIAGNOSIS — M7062 Trochanteric bursitis, left hip: Secondary | ICD-10-CM | POA: Diagnosis not present

## 2020-07-21 DIAGNOSIS — E119 Type 2 diabetes mellitus without complications: Secondary | ICD-10-CM | POA: Diagnosis not present

## 2020-07-21 DIAGNOSIS — I6521 Occlusion and stenosis of right carotid artery: Secondary | ICD-10-CM | POA: Diagnosis not present

## 2020-07-21 DIAGNOSIS — Z86010 Personal history of colonic polyps: Secondary | ICD-10-CM | POA: Diagnosis not present

## 2020-07-21 DIAGNOSIS — I10 Essential (primary) hypertension: Secondary | ICD-10-CM | POA: Diagnosis not present

## 2020-07-21 DIAGNOSIS — Z7984 Long term (current) use of oral hypoglycemic drugs: Secondary | ICD-10-CM | POA: Diagnosis not present

## 2020-07-21 DIAGNOSIS — M419 Scoliosis, unspecified: Secondary | ICD-10-CM | POA: Diagnosis not present

## 2020-07-21 DIAGNOSIS — D692 Other nonthrombocytopenic purpura: Secondary | ICD-10-CM | POA: Diagnosis not present

## 2020-07-21 DIAGNOSIS — M810 Age-related osteoporosis without current pathological fracture: Secondary | ICD-10-CM | POA: Diagnosis not present

## 2020-07-26 DIAGNOSIS — Z7984 Long term (current) use of oral hypoglycemic drugs: Secondary | ICD-10-CM | POA: Diagnosis not present

## 2020-07-26 DIAGNOSIS — D692 Other nonthrombocytopenic purpura: Secondary | ICD-10-CM | POA: Diagnosis not present

## 2020-07-26 DIAGNOSIS — I10 Essential (primary) hypertension: Secondary | ICD-10-CM | POA: Diagnosis not present

## 2020-07-26 DIAGNOSIS — E785 Hyperlipidemia, unspecified: Secondary | ICD-10-CM | POA: Diagnosis not present

## 2020-07-26 DIAGNOSIS — M419 Scoliosis, unspecified: Secondary | ICD-10-CM | POA: Diagnosis not present

## 2020-07-26 DIAGNOSIS — Z86010 Personal history of colonic polyps: Secondary | ICD-10-CM | POA: Diagnosis not present

## 2020-07-26 DIAGNOSIS — M7062 Trochanteric bursitis, left hip: Secondary | ICD-10-CM | POA: Diagnosis not present

## 2020-07-26 DIAGNOSIS — E119 Type 2 diabetes mellitus without complications: Secondary | ICD-10-CM | POA: Diagnosis not present

## 2020-07-26 DIAGNOSIS — M810 Age-related osteoporosis without current pathological fracture: Secondary | ICD-10-CM | POA: Diagnosis not present

## 2020-07-26 DIAGNOSIS — D649 Anemia, unspecified: Secondary | ICD-10-CM | POA: Diagnosis not present

## 2020-07-26 DIAGNOSIS — M21371 Foot drop, right foot: Secondary | ICD-10-CM | POA: Diagnosis not present

## 2020-07-26 DIAGNOSIS — I2510 Atherosclerotic heart disease of native coronary artery without angina pectoris: Secondary | ICD-10-CM | POA: Diagnosis not present

## 2020-07-26 DIAGNOSIS — I6521 Occlusion and stenosis of right carotid artery: Secondary | ICD-10-CM | POA: Diagnosis not present

## 2020-07-26 DIAGNOSIS — G8929 Other chronic pain: Secondary | ICD-10-CM | POA: Diagnosis not present

## 2020-07-26 DIAGNOSIS — I714 Abdominal aortic aneurysm, without rupture: Secondary | ICD-10-CM | POA: Diagnosis not present

## 2020-07-26 DIAGNOSIS — Z9181 History of falling: Secondary | ICD-10-CM | POA: Diagnosis not present

## 2020-07-26 DIAGNOSIS — I252 Old myocardial infarction: Secondary | ICD-10-CM | POA: Diagnosis not present

## 2020-07-26 DIAGNOSIS — K219 Gastro-esophageal reflux disease without esophagitis: Secondary | ICD-10-CM | POA: Diagnosis not present

## 2020-07-28 DIAGNOSIS — M7062 Trochanteric bursitis, left hip: Secondary | ICD-10-CM | POA: Diagnosis not present

## 2020-07-28 DIAGNOSIS — I2510 Atherosclerotic heart disease of native coronary artery without angina pectoris: Secondary | ICD-10-CM | POA: Diagnosis not present

## 2020-07-28 DIAGNOSIS — I10 Essential (primary) hypertension: Secondary | ICD-10-CM | POA: Diagnosis not present

## 2020-07-28 DIAGNOSIS — M21371 Foot drop, right foot: Secondary | ICD-10-CM | POA: Diagnosis not present

## 2020-07-28 DIAGNOSIS — I6521 Occlusion and stenosis of right carotid artery: Secondary | ICD-10-CM | POA: Diagnosis not present

## 2020-07-28 DIAGNOSIS — E119 Type 2 diabetes mellitus without complications: Secondary | ICD-10-CM | POA: Diagnosis not present

## 2020-07-28 DIAGNOSIS — I714 Abdominal aortic aneurysm, without rupture: Secondary | ICD-10-CM | POA: Diagnosis not present

## 2020-07-28 DIAGNOSIS — E785 Hyperlipidemia, unspecified: Secondary | ICD-10-CM | POA: Diagnosis not present

## 2020-07-28 DIAGNOSIS — Z7984 Long term (current) use of oral hypoglycemic drugs: Secondary | ICD-10-CM | POA: Diagnosis not present

## 2020-07-28 DIAGNOSIS — D692 Other nonthrombocytopenic purpura: Secondary | ICD-10-CM | POA: Diagnosis not present

## 2020-07-28 DIAGNOSIS — G8929 Other chronic pain: Secondary | ICD-10-CM | POA: Diagnosis not present

## 2020-07-28 DIAGNOSIS — M419 Scoliosis, unspecified: Secondary | ICD-10-CM | POA: Diagnosis not present

## 2020-07-28 DIAGNOSIS — Z9181 History of falling: Secondary | ICD-10-CM | POA: Diagnosis not present

## 2020-07-28 DIAGNOSIS — D649 Anemia, unspecified: Secondary | ICD-10-CM | POA: Diagnosis not present

## 2020-07-28 DIAGNOSIS — M810 Age-related osteoporosis without current pathological fracture: Secondary | ICD-10-CM | POA: Diagnosis not present

## 2020-07-28 DIAGNOSIS — K219 Gastro-esophageal reflux disease without esophagitis: Secondary | ICD-10-CM | POA: Diagnosis not present

## 2020-07-28 DIAGNOSIS — I252 Old myocardial infarction: Secondary | ICD-10-CM | POA: Diagnosis not present

## 2020-07-28 DIAGNOSIS — Z86010 Personal history of colonic polyps: Secondary | ICD-10-CM | POA: Diagnosis not present

## 2020-07-29 ENCOUNTER — Telehealth: Payer: Self-pay

## 2020-07-29 NOTE — Telephone Encounter (Signed)
Returned call and spoke with [facility] and provided VO.

## 2020-07-29 NOTE — Telephone Encounter (Signed)
Steven [name], PT with [facility] called requesting extension on home health physical therapy for 4 weeks, twice a week. Please advise.

## 2020-08-04 DIAGNOSIS — G8929 Other chronic pain: Secondary | ICD-10-CM | POA: Diagnosis not present

## 2020-08-04 DIAGNOSIS — D692 Other nonthrombocytopenic purpura: Secondary | ICD-10-CM | POA: Diagnosis not present

## 2020-08-04 DIAGNOSIS — M419 Scoliosis, unspecified: Secondary | ICD-10-CM | POA: Diagnosis not present

## 2020-08-04 DIAGNOSIS — D649 Anemia, unspecified: Secondary | ICD-10-CM | POA: Diagnosis not present

## 2020-08-04 DIAGNOSIS — M810 Age-related osteoporosis without current pathological fracture: Secondary | ICD-10-CM | POA: Diagnosis not present

## 2020-08-04 DIAGNOSIS — I252 Old myocardial infarction: Secondary | ICD-10-CM | POA: Diagnosis not present

## 2020-08-04 DIAGNOSIS — E785 Hyperlipidemia, unspecified: Secondary | ICD-10-CM | POA: Diagnosis not present

## 2020-08-04 DIAGNOSIS — M7062 Trochanteric bursitis, left hip: Secondary | ICD-10-CM | POA: Diagnosis not present

## 2020-08-04 DIAGNOSIS — Z86010 Personal history of colonic polyps: Secondary | ICD-10-CM | POA: Diagnosis not present

## 2020-08-04 DIAGNOSIS — I10 Essential (primary) hypertension: Secondary | ICD-10-CM | POA: Diagnosis not present

## 2020-08-04 DIAGNOSIS — M21371 Foot drop, right foot: Secondary | ICD-10-CM | POA: Diagnosis not present

## 2020-08-04 DIAGNOSIS — I2510 Atherosclerotic heart disease of native coronary artery without angina pectoris: Secondary | ICD-10-CM | POA: Diagnosis not present

## 2020-08-04 DIAGNOSIS — I6521 Occlusion and stenosis of right carotid artery: Secondary | ICD-10-CM | POA: Diagnosis not present

## 2020-08-04 DIAGNOSIS — I714 Abdominal aortic aneurysm, without rupture: Secondary | ICD-10-CM | POA: Diagnosis not present

## 2020-08-04 DIAGNOSIS — K219 Gastro-esophageal reflux disease without esophagitis: Secondary | ICD-10-CM | POA: Diagnosis not present

## 2020-08-04 DIAGNOSIS — Z7984 Long term (current) use of oral hypoglycemic drugs: Secondary | ICD-10-CM | POA: Diagnosis not present

## 2020-08-04 DIAGNOSIS — E119 Type 2 diabetes mellitus without complications: Secondary | ICD-10-CM | POA: Diagnosis not present

## 2020-08-04 DIAGNOSIS — Z9181 History of falling: Secondary | ICD-10-CM | POA: Diagnosis not present

## 2020-08-05 DIAGNOSIS — Z7984 Long term (current) use of oral hypoglycemic drugs: Secondary | ICD-10-CM | POA: Diagnosis not present

## 2020-08-05 DIAGNOSIS — K219 Gastro-esophageal reflux disease without esophagitis: Secondary | ICD-10-CM | POA: Diagnosis not present

## 2020-08-05 DIAGNOSIS — I6521 Occlusion and stenosis of right carotid artery: Secondary | ICD-10-CM | POA: Diagnosis not present

## 2020-08-05 DIAGNOSIS — E785 Hyperlipidemia, unspecified: Secondary | ICD-10-CM | POA: Diagnosis not present

## 2020-08-05 DIAGNOSIS — E119 Type 2 diabetes mellitus without complications: Secondary | ICD-10-CM | POA: Diagnosis not present

## 2020-08-05 DIAGNOSIS — M21371 Foot drop, right foot: Secondary | ICD-10-CM | POA: Diagnosis not present

## 2020-08-05 DIAGNOSIS — M7062 Trochanteric bursitis, left hip: Secondary | ICD-10-CM | POA: Diagnosis not present

## 2020-08-05 DIAGNOSIS — G8929 Other chronic pain: Secondary | ICD-10-CM | POA: Diagnosis not present

## 2020-08-05 DIAGNOSIS — I714 Abdominal aortic aneurysm, without rupture: Secondary | ICD-10-CM | POA: Diagnosis not present

## 2020-08-05 DIAGNOSIS — I252 Old myocardial infarction: Secondary | ICD-10-CM | POA: Diagnosis not present

## 2020-08-05 DIAGNOSIS — M419 Scoliosis, unspecified: Secondary | ICD-10-CM | POA: Diagnosis not present

## 2020-08-05 DIAGNOSIS — D692 Other nonthrombocytopenic purpura: Secondary | ICD-10-CM | POA: Diagnosis not present

## 2020-08-05 DIAGNOSIS — Z86010 Personal history of colonic polyps: Secondary | ICD-10-CM | POA: Diagnosis not present

## 2020-08-05 DIAGNOSIS — I10 Essential (primary) hypertension: Secondary | ICD-10-CM | POA: Diagnosis not present

## 2020-08-05 DIAGNOSIS — I2510 Atherosclerotic heart disease of native coronary artery without angina pectoris: Secondary | ICD-10-CM | POA: Diagnosis not present

## 2020-08-05 DIAGNOSIS — M810 Age-related osteoporosis without current pathological fracture: Secondary | ICD-10-CM | POA: Diagnosis not present

## 2020-08-05 DIAGNOSIS — D649 Anemia, unspecified: Secondary | ICD-10-CM | POA: Diagnosis not present

## 2020-08-05 DIAGNOSIS — Z9181 History of falling: Secondary | ICD-10-CM | POA: Diagnosis not present

## 2020-08-09 DIAGNOSIS — G8929 Other chronic pain: Secondary | ICD-10-CM | POA: Diagnosis not present

## 2020-08-09 DIAGNOSIS — M21371 Foot drop, right foot: Secondary | ICD-10-CM | POA: Diagnosis not present

## 2020-08-09 DIAGNOSIS — I6521 Occlusion and stenosis of right carotid artery: Secondary | ICD-10-CM | POA: Diagnosis not present

## 2020-08-09 DIAGNOSIS — E119 Type 2 diabetes mellitus without complications: Secondary | ICD-10-CM | POA: Diagnosis not present

## 2020-08-09 DIAGNOSIS — D649 Anemia, unspecified: Secondary | ICD-10-CM | POA: Diagnosis not present

## 2020-08-09 DIAGNOSIS — Z86010 Personal history of colonic polyps: Secondary | ICD-10-CM | POA: Diagnosis not present

## 2020-08-09 DIAGNOSIS — I714 Abdominal aortic aneurysm, without rupture: Secondary | ICD-10-CM | POA: Diagnosis not present

## 2020-08-09 DIAGNOSIS — I10 Essential (primary) hypertension: Secondary | ICD-10-CM | POA: Diagnosis not present

## 2020-08-09 DIAGNOSIS — I252 Old myocardial infarction: Secondary | ICD-10-CM | POA: Diagnosis not present

## 2020-08-09 DIAGNOSIS — I2510 Atherosclerotic heart disease of native coronary artery without angina pectoris: Secondary | ICD-10-CM | POA: Diagnosis not present

## 2020-08-09 DIAGNOSIS — D692 Other nonthrombocytopenic purpura: Secondary | ICD-10-CM | POA: Diagnosis not present

## 2020-08-09 DIAGNOSIS — M7062 Trochanteric bursitis, left hip: Secondary | ICD-10-CM | POA: Diagnosis not present

## 2020-08-09 DIAGNOSIS — K219 Gastro-esophageal reflux disease without esophagitis: Secondary | ICD-10-CM | POA: Diagnosis not present

## 2020-08-09 DIAGNOSIS — E785 Hyperlipidemia, unspecified: Secondary | ICD-10-CM | POA: Diagnosis not present

## 2020-08-09 DIAGNOSIS — M419 Scoliosis, unspecified: Secondary | ICD-10-CM | POA: Diagnosis not present

## 2020-08-09 DIAGNOSIS — Z7984 Long term (current) use of oral hypoglycemic drugs: Secondary | ICD-10-CM | POA: Diagnosis not present

## 2020-08-09 DIAGNOSIS — M810 Age-related osteoporosis without current pathological fracture: Secondary | ICD-10-CM | POA: Diagnosis not present

## 2020-08-09 DIAGNOSIS — Z9181 History of falling: Secondary | ICD-10-CM | POA: Diagnosis not present

## 2020-08-11 DIAGNOSIS — M7062 Trochanteric bursitis, left hip: Secondary | ICD-10-CM | POA: Diagnosis not present

## 2020-08-11 DIAGNOSIS — Z7984 Long term (current) use of oral hypoglycemic drugs: Secondary | ICD-10-CM | POA: Diagnosis not present

## 2020-08-11 DIAGNOSIS — E785 Hyperlipidemia, unspecified: Secondary | ICD-10-CM | POA: Diagnosis not present

## 2020-08-11 DIAGNOSIS — M810 Age-related osteoporosis without current pathological fracture: Secondary | ICD-10-CM | POA: Diagnosis not present

## 2020-08-11 DIAGNOSIS — I2510 Atherosclerotic heart disease of native coronary artery without angina pectoris: Secondary | ICD-10-CM | POA: Diagnosis not present

## 2020-08-11 DIAGNOSIS — G8929 Other chronic pain: Secondary | ICD-10-CM | POA: Diagnosis not present

## 2020-08-11 DIAGNOSIS — I714 Abdominal aortic aneurysm, without rupture: Secondary | ICD-10-CM | POA: Diagnosis not present

## 2020-08-11 DIAGNOSIS — Z9181 History of falling: Secondary | ICD-10-CM | POA: Diagnosis not present

## 2020-08-11 DIAGNOSIS — Z86010 Personal history of colonic polyps: Secondary | ICD-10-CM | POA: Diagnosis not present

## 2020-08-11 DIAGNOSIS — M419 Scoliosis, unspecified: Secondary | ICD-10-CM | POA: Diagnosis not present

## 2020-08-11 DIAGNOSIS — K219 Gastro-esophageal reflux disease without esophagitis: Secondary | ICD-10-CM | POA: Diagnosis not present

## 2020-08-11 DIAGNOSIS — D692 Other nonthrombocytopenic purpura: Secondary | ICD-10-CM | POA: Diagnosis not present

## 2020-08-11 DIAGNOSIS — I6521 Occlusion and stenosis of right carotid artery: Secondary | ICD-10-CM | POA: Diagnosis not present

## 2020-08-11 DIAGNOSIS — D649 Anemia, unspecified: Secondary | ICD-10-CM | POA: Diagnosis not present

## 2020-08-11 DIAGNOSIS — E119 Type 2 diabetes mellitus without complications: Secondary | ICD-10-CM | POA: Diagnosis not present

## 2020-08-11 DIAGNOSIS — I252 Old myocardial infarction: Secondary | ICD-10-CM | POA: Diagnosis not present

## 2020-08-11 DIAGNOSIS — M21371 Foot drop, right foot: Secondary | ICD-10-CM | POA: Diagnosis not present

## 2020-08-11 DIAGNOSIS — I10 Essential (primary) hypertension: Secondary | ICD-10-CM | POA: Diagnosis not present

## 2020-08-16 DIAGNOSIS — M419 Scoliosis, unspecified: Secondary | ICD-10-CM | POA: Diagnosis not present

## 2020-08-16 DIAGNOSIS — D649 Anemia, unspecified: Secondary | ICD-10-CM | POA: Diagnosis not present

## 2020-08-16 DIAGNOSIS — D692 Other nonthrombocytopenic purpura: Secondary | ICD-10-CM | POA: Diagnosis not present

## 2020-08-16 DIAGNOSIS — I6521 Occlusion and stenosis of right carotid artery: Secondary | ICD-10-CM | POA: Diagnosis not present

## 2020-08-16 DIAGNOSIS — M7062 Trochanteric bursitis, left hip: Secondary | ICD-10-CM | POA: Diagnosis not present

## 2020-08-16 DIAGNOSIS — E119 Type 2 diabetes mellitus without complications: Secondary | ICD-10-CM | POA: Diagnosis not present

## 2020-08-16 DIAGNOSIS — M810 Age-related osteoporosis without current pathological fracture: Secondary | ICD-10-CM | POA: Diagnosis not present

## 2020-08-16 DIAGNOSIS — I714 Abdominal aortic aneurysm, without rupture: Secondary | ICD-10-CM | POA: Diagnosis not present

## 2020-08-16 DIAGNOSIS — Z9181 History of falling: Secondary | ICD-10-CM | POA: Diagnosis not present

## 2020-08-16 DIAGNOSIS — I2510 Atherosclerotic heart disease of native coronary artery without angina pectoris: Secondary | ICD-10-CM | POA: Diagnosis not present

## 2020-08-16 DIAGNOSIS — Z7984 Long term (current) use of oral hypoglycemic drugs: Secondary | ICD-10-CM | POA: Diagnosis not present

## 2020-08-16 DIAGNOSIS — I252 Old myocardial infarction: Secondary | ICD-10-CM | POA: Diagnosis not present

## 2020-08-16 DIAGNOSIS — Z86010 Personal history of colonic polyps: Secondary | ICD-10-CM | POA: Diagnosis not present

## 2020-08-16 DIAGNOSIS — K219 Gastro-esophageal reflux disease without esophagitis: Secondary | ICD-10-CM | POA: Diagnosis not present

## 2020-08-16 DIAGNOSIS — M21371 Foot drop, right foot: Secondary | ICD-10-CM | POA: Diagnosis not present

## 2020-08-16 DIAGNOSIS — E785 Hyperlipidemia, unspecified: Secondary | ICD-10-CM | POA: Diagnosis not present

## 2020-08-16 DIAGNOSIS — I10 Essential (primary) hypertension: Secondary | ICD-10-CM | POA: Diagnosis not present

## 2020-08-16 DIAGNOSIS — G8929 Other chronic pain: Secondary | ICD-10-CM | POA: Diagnosis not present

## 2020-08-17 ENCOUNTER — Other Ambulatory Visit: Payer: Self-pay | Admitting: Family Medicine

## 2020-08-18 DIAGNOSIS — M419 Scoliosis, unspecified: Secondary | ICD-10-CM | POA: Diagnosis not present

## 2020-08-18 DIAGNOSIS — M7062 Trochanteric bursitis, left hip: Secondary | ICD-10-CM | POA: Diagnosis not present

## 2020-08-18 DIAGNOSIS — M810 Age-related osteoporosis without current pathological fracture: Secondary | ICD-10-CM | POA: Diagnosis not present

## 2020-08-18 DIAGNOSIS — E785 Hyperlipidemia, unspecified: Secondary | ICD-10-CM | POA: Diagnosis not present

## 2020-08-18 DIAGNOSIS — I714 Abdominal aortic aneurysm, without rupture: Secondary | ICD-10-CM | POA: Diagnosis not present

## 2020-08-18 DIAGNOSIS — I252 Old myocardial infarction: Secondary | ICD-10-CM | POA: Diagnosis not present

## 2020-08-18 DIAGNOSIS — Z7984 Long term (current) use of oral hypoglycemic drugs: Secondary | ICD-10-CM | POA: Diagnosis not present

## 2020-08-18 DIAGNOSIS — Z9181 History of falling: Secondary | ICD-10-CM | POA: Diagnosis not present

## 2020-08-18 DIAGNOSIS — G8929 Other chronic pain: Secondary | ICD-10-CM | POA: Diagnosis not present

## 2020-08-18 DIAGNOSIS — D692 Other nonthrombocytopenic purpura: Secondary | ICD-10-CM | POA: Diagnosis not present

## 2020-08-18 DIAGNOSIS — I10 Essential (primary) hypertension: Secondary | ICD-10-CM | POA: Diagnosis not present

## 2020-08-18 DIAGNOSIS — I2510 Atherosclerotic heart disease of native coronary artery without angina pectoris: Secondary | ICD-10-CM | POA: Diagnosis not present

## 2020-08-18 DIAGNOSIS — K219 Gastro-esophageal reflux disease without esophagitis: Secondary | ICD-10-CM | POA: Diagnosis not present

## 2020-08-18 DIAGNOSIS — M21371 Foot drop, right foot: Secondary | ICD-10-CM | POA: Diagnosis not present

## 2020-08-18 DIAGNOSIS — I6521 Occlusion and stenosis of right carotid artery: Secondary | ICD-10-CM | POA: Diagnosis not present

## 2020-08-18 DIAGNOSIS — E119 Type 2 diabetes mellitus without complications: Secondary | ICD-10-CM | POA: Diagnosis not present

## 2020-08-18 DIAGNOSIS — D649 Anemia, unspecified: Secondary | ICD-10-CM | POA: Diagnosis not present

## 2020-08-18 DIAGNOSIS — Z86010 Personal history of colonic polyps: Secondary | ICD-10-CM | POA: Diagnosis not present

## 2020-08-23 DIAGNOSIS — Z9181 History of falling: Secondary | ICD-10-CM | POA: Diagnosis not present

## 2020-08-23 DIAGNOSIS — E785 Hyperlipidemia, unspecified: Secondary | ICD-10-CM | POA: Diagnosis not present

## 2020-08-23 DIAGNOSIS — Z7984 Long term (current) use of oral hypoglycemic drugs: Secondary | ICD-10-CM | POA: Diagnosis not present

## 2020-08-23 DIAGNOSIS — I714 Abdominal aortic aneurysm, without rupture: Secondary | ICD-10-CM | POA: Diagnosis not present

## 2020-08-23 DIAGNOSIS — I2510 Atherosclerotic heart disease of native coronary artery without angina pectoris: Secondary | ICD-10-CM | POA: Diagnosis not present

## 2020-08-23 DIAGNOSIS — M419 Scoliosis, unspecified: Secondary | ICD-10-CM | POA: Diagnosis not present

## 2020-08-23 DIAGNOSIS — I10 Essential (primary) hypertension: Secondary | ICD-10-CM | POA: Diagnosis not present

## 2020-08-23 DIAGNOSIS — I6521 Occlusion and stenosis of right carotid artery: Secondary | ICD-10-CM | POA: Diagnosis not present

## 2020-08-23 DIAGNOSIS — D692 Other nonthrombocytopenic purpura: Secondary | ICD-10-CM | POA: Diagnosis not present

## 2020-08-23 DIAGNOSIS — M810 Age-related osteoporosis without current pathological fracture: Secondary | ICD-10-CM | POA: Diagnosis not present

## 2020-08-23 DIAGNOSIS — Z86010 Personal history of colonic polyps: Secondary | ICD-10-CM | POA: Diagnosis not present

## 2020-08-23 DIAGNOSIS — D649 Anemia, unspecified: Secondary | ICD-10-CM | POA: Diagnosis not present

## 2020-08-23 DIAGNOSIS — E119 Type 2 diabetes mellitus without complications: Secondary | ICD-10-CM | POA: Diagnosis not present

## 2020-08-23 DIAGNOSIS — G8929 Other chronic pain: Secondary | ICD-10-CM | POA: Diagnosis not present

## 2020-08-23 DIAGNOSIS — M21371 Foot drop, right foot: Secondary | ICD-10-CM | POA: Diagnosis not present

## 2020-08-23 DIAGNOSIS — I252 Old myocardial infarction: Secondary | ICD-10-CM | POA: Diagnosis not present

## 2020-08-23 DIAGNOSIS — K219 Gastro-esophageal reflux disease without esophagitis: Secondary | ICD-10-CM | POA: Diagnosis not present

## 2020-08-23 DIAGNOSIS — M7062 Trochanteric bursitis, left hip: Secondary | ICD-10-CM | POA: Diagnosis not present

## 2020-08-24 DIAGNOSIS — I714 Abdominal aortic aneurysm, without rupture: Secondary | ICD-10-CM | POA: Diagnosis not present

## 2020-08-24 DIAGNOSIS — K219 Gastro-esophageal reflux disease without esophagitis: Secondary | ICD-10-CM | POA: Diagnosis not present

## 2020-08-24 DIAGNOSIS — Z86010 Personal history of colonic polyps: Secondary | ICD-10-CM | POA: Diagnosis not present

## 2020-08-24 DIAGNOSIS — I252 Old myocardial infarction: Secondary | ICD-10-CM | POA: Diagnosis not present

## 2020-08-24 DIAGNOSIS — I6521 Occlusion and stenosis of right carotid artery: Secondary | ICD-10-CM | POA: Diagnosis not present

## 2020-08-24 DIAGNOSIS — I10 Essential (primary) hypertension: Secondary | ICD-10-CM | POA: Diagnosis not present

## 2020-08-24 DIAGNOSIS — D692 Other nonthrombocytopenic purpura: Secondary | ICD-10-CM | POA: Diagnosis not present

## 2020-08-24 DIAGNOSIS — M7062 Trochanteric bursitis, left hip: Secondary | ICD-10-CM | POA: Diagnosis not present

## 2020-08-24 DIAGNOSIS — M21371 Foot drop, right foot: Secondary | ICD-10-CM | POA: Diagnosis not present

## 2020-08-24 DIAGNOSIS — E785 Hyperlipidemia, unspecified: Secondary | ICD-10-CM | POA: Diagnosis not present

## 2020-08-24 DIAGNOSIS — I2510 Atherosclerotic heart disease of native coronary artery without angina pectoris: Secondary | ICD-10-CM | POA: Diagnosis not present

## 2020-08-24 DIAGNOSIS — Z7984 Long term (current) use of oral hypoglycemic drugs: Secondary | ICD-10-CM | POA: Diagnosis not present

## 2020-08-24 DIAGNOSIS — G8929 Other chronic pain: Secondary | ICD-10-CM | POA: Diagnosis not present

## 2020-08-24 DIAGNOSIS — M419 Scoliosis, unspecified: Secondary | ICD-10-CM | POA: Diagnosis not present

## 2020-08-24 DIAGNOSIS — Z9181 History of falling: Secondary | ICD-10-CM | POA: Diagnosis not present

## 2020-08-24 DIAGNOSIS — E119 Type 2 diabetes mellitus without complications: Secondary | ICD-10-CM | POA: Diagnosis not present

## 2020-08-24 DIAGNOSIS — M810 Age-related osteoporosis without current pathological fracture: Secondary | ICD-10-CM | POA: Diagnosis not present

## 2020-08-24 DIAGNOSIS — D649 Anemia, unspecified: Secondary | ICD-10-CM | POA: Diagnosis not present

## 2020-09-05 ENCOUNTER — Other Ambulatory Visit: Payer: Self-pay | Admitting: Internal Medicine

## 2020-09-05 ENCOUNTER — Other Ambulatory Visit: Payer: Self-pay | Admitting: Family Medicine

## 2020-09-06 NOTE — Telephone Encounter (Signed)
Rx(s) sent to pharmacy electronically.  

## 2020-09-07 ENCOUNTER — Other Ambulatory Visit: Payer: Self-pay | Admitting: Family Medicine

## 2020-09-29 ENCOUNTER — Other Ambulatory Visit: Payer: Self-pay | Admitting: Family Medicine

## 2020-10-01 ENCOUNTER — Other Ambulatory Visit: Payer: Self-pay

## 2020-10-01 ENCOUNTER — Encounter: Payer: Self-pay | Admitting: Registered Nurse

## 2020-10-01 ENCOUNTER — Ambulatory Visit (INDEPENDENT_AMBULATORY_CARE_PROVIDER_SITE_OTHER): Payer: Medicare Other | Admitting: Registered Nurse

## 2020-10-01 VITALS — BP 118/59 | HR 71 | Temp 98.2°F | Ht 69.0 in

## 2020-10-01 DIAGNOSIS — S91109A Unspecified open wound of unspecified toe(s) without damage to nail, initial encounter: Secondary | ICD-10-CM | POA: Diagnosis not present

## 2020-10-01 MED ORDER — MUPIROCIN 2 % EX OINT: TOPICAL_OINTMENT | Freq: Two times a day (BID) | CUTANEOUS | 0 refills | Status: DC

## 2020-10-01 NOTE — Progress Notes (Signed)
Acute Office Visit  Subjective:    Patient ID: Steven [name], male    DOB: 10-14-1930, 87 y.o.   MRN: [id number]  Chief Complaint  Patient presents with   Puncture Wound    Right big toe    HPI Patient is in today for puncture wound  R great toe Round wound on top of toe Unsure of cause - may have dropped something on it Wound is shallow Was purulent drainage with some blood a while ago but this has resolved after keeping it clean, dry and applying neosporin 1-2 times daily Wound has persisted for about 2-3 weeks which had elicited the concern of Mr. Quam and his nurse, Luellen Pucker.  Past Medical History:  Diagnosis Date   Abnormality of gait 08/23/2012   Anemia    hx of   Arthritis 01-03-12   osteoarthritis-knees   CAD (coronary artery disease) 2004   s/p inferior wall Mi 2004 with PTCA/Stent; s/p CABG 2004   Degenerative arthritis    Diabetes mellitus    type II   Foot drop, bilateral 08/23/2012   GERD (gastroesophageal reflux disease)    Heart disease    History of kidney stones    History of myocardial infarction    Hyperlipidemia    Myocardial infarction [facility]) 01-03-12   '04   MYOCARDIAL INFARCTION, HX OF 04/19/2007   Qualifier: Diagnosis of  By: Scherrie Gerlach     Neuropathy 01-03-12   bil. feet   Pneumonia    hx of   Polyneuropathy in diabetes(357.2) 08/23/2012   Radiculopathy of lumbar region 08/23/2011   Followed by Dr. Vertell Limber. S/p surgery. Resolved after surgery.      Past Surgical History:  Procedure Laterality Date   BACK SURGERY  2013   CARDIAC CATHETERIZATION  01-03-12   '04   CATARACT EXTRACTION Bilateral 2015   COLONOSCOPY     CORONARY ARTERY BYPASS GRAFT  2004   LIMA to LAD; SVG to ramus intermedius; SVG to PDA/PLSA   LAMINECTOMY  09/08/2011   Procedure: LUMBAR LAMINECTOMY FOR TUMOR;  Surgeon: Erline Levine, MD;  Location: [place] [facility];  Service: Neurosurgery;  Laterality: Left;  Left Thoracic twelve-Lumbar one transpedicular resection of epidural mass    TONSILLECTOMY  as child   TOTAL KNEE ARTHROPLASTY  01/15/2012   Procedure: TOTAL KNEE ARTHROPLASTY;  Surgeon: Gearlean Alf, MD;  Location: [facility];  Service: Orthopedics;  Laterality: Right;   TOTAL KNEE ARTHROPLASTY Left 09/16/2012   Procedure: LEFT TOTAL KNEE ARTHROPLASTY;  Surgeon: Gearlean Alf, MD;  Location: [facility];  Service: Orthopedics;  Laterality: Left;    Family History  Problem Relation Age of Onset   Cirrhosis Mother        etiology unclear   Hypertension Father    Stroke Father    Cancer Sister        breast   Stroke Maternal Grandmother    Heart disease Maternal Grandfather        MI   Stroke Paternal Grandmother    Stroke Paternal Grandfather    Anesthesia problems Neg Hx    Hypotension Neg Hx    Malignant hyperthermia Neg Hx    Pseudochol deficiency Neg Hx     Social History   Socioeconomic History   Marital status: Married    Spouse name: Not on file   Number of children: 2   Years of education: College   Highest education level: Not on file  Occupational History   Occupation:  Research scientist (life sciences)  Tobacco Use   Smoking status: Never   Smokeless tobacco: Never  Vaping Use   Vaping Use: Never used  Substance and Sexual Activity   Alcohol use: No   Drug use: No   Sexual activity: Not Currently  Other Topics Concern   Not on file  Social History Narrative   Married 1956.  2 children. No grandkids. Neither children married.    1 daughter lives at house and watches over parents.       Retired from [employer]. Worked for hisself afterwards.       Regular exercise: no   Daily Caffeine: 3 cups coffee daily   Social Determinants of Health   Financial Resource Strain: Not on file  Food Insecurity: Not on file  Transportation Needs: Not on file  Physical Activity: Not on file  Stress: Not on file  Social Connections: Not on file  Intimate Partner Violence: Not on file    Outpatient Medications Prior to Visit  Medication Sig  Dispense Refill   ACCU-CHEK GUIDE test strip USE UP TO TWICE DAILY AS DIRECTED 50 strip 3   acetaminophen (TYLENOL) 500 MG tablet Take 500 mg by mouth every 6 (six) hours as needed. Up to 6 tabs (3gm) qd     aspirin 81 MG tablet Take 81 mg by mouth daily.     atorvastatin (LIPITOR) 80 MG tablet TAKE 1 TABLET DAILY. 90 tablet 3   blood glucose meter kit and supplies KIT Dispense based on patient and insurance preference. Use up to twice daily as directed. E11.9 1 each 0   cycloSPORINE (RESTASIS) 0.05 % ophthalmic emulsion 1 drop 2 (two) times daily.     diclofenac sodium (VOLTAREN) 1 % GEL Apply 4 g topically 4 (four) times daily. (Patient taking differently: Apply 4 g topically in the morning and at bedtime.) 100 g 1   diltiazem (MATZIM LA) 180 MG 24 hr tablet Take 1 tablet (180 mg total) by mouth daily. 90 tablet 1   glucose blood test strip Use to test your blood sugar daily. E11.9 One touch Verio test strips 100 each 12   hydrocortisone 2.5 % cream APPLICATIONS APPLY ON THE SKIN EVERY DAY     Lancets (ONETOUCH ULTRASOFT) lancets USE TO TEST BLOOD SUGARS DAILY. 100 each 3   lisinopril (ZESTRIL) 2.5 MG tablet Take a half tab daily. 45 tablet 3   metFORMIN (GLUCOPHAGE) 1000 MG tablet TAKE 1 TABLET (1,000 MG TOTAL) BY MOUTH 2 (TWO) TIMES DAILY WITH A MEAL. 180 tablet 3   metoprolol succinate (TOPROL-XL) 50 MG 24 hr tablet TAKE 1 TABLET BY MOUTH EVERY DAY 90 tablet 1   mupirocin ointment (BACTROBAN) 2 % Apply to affected area 1-2 times daily 22 g 0   omeprazole (PRILOSEC) 20 MG capsule Take 1 capsule (20 mg total) by mouth daily. (Patient taking differently: Take 20 mg by mouth as needed.) 90 capsule 3   ONETOUCH VERIO test strip USE TO TEST BLOOD SUGARS DAILY. 100 each 3   traMADol (ULTRAM) 50 MG tablet Take 1 tablet (50 mg total) by mouth every 8 (eight) hours as needed. 60 tablet 1   No facility-administered medications prior to visit.    Allergies  Allergen Reactions   Alendronate      reflux    Review of Systems  Constitutional: Negative.   HENT: Negative.    Eyes: Negative.   Respiratory: Negative.    Cardiovascular: Negative.   Gastrointestinal: Negative.   Genitourinary:  Negative.   Musculoskeletal: Negative.   Skin: Negative.   Neurological: Negative.   Psychiatric/Behavioral: Negative.    All other systems reviewed and are negative.     Objective:    Physical Exam Vitals and nursing note reviewed.  Constitutional:      Appearance: Normal appearance.  Cardiovascular:     Rate and Rhythm: Normal rate and regular rhythm.     Pulses: Normal pulses.     Heart sounds: Normal heart sounds.  Pulmonary:     Effort: Pulmonary effort is normal.     Breath sounds: Normal breath sounds.  Skin:    General: Skin is warm and dry.     Capillary Refill: Capillary refill takes 2 to 3 seconds.     Findings: Lesion (wound top of toe. shallow. does not appear infected. can see some healthy pink granular tissue) present.  Neurological:     General: No focal deficit present.     Mental Status: He is alert. Mental status is at baseline.  Psychiatric:        Mood and Affect: Mood normal.        Behavior: Behavior normal.        Thought Content: Thought content normal.        Judgment: Judgment normal.    BP (!) 118/59   Pulse 71   Temp 98.2 F (36.8 C)   Ht $R'5\' 9"'FU$  (1.753 m)   SpO2 96%   BMI 22.89 kg/m  Wt Readings from Last 3 Encounters:  11/21/19 155 lb (70.3 kg)  11/18/19 150 lb (68 kg)  09/18/19 153 lb (69.4 kg)    Health Maintenance Due  Topic Date Due   Zoster Vaccines- Shingrix (1 of 2) Never done   FOOT EXAM  03/29/2018   OPHTHALMOLOGY EXAM  12/04/2019   COVID-19 Vaccine (3 - Booster for [employer] series) 01/04/2020   DEXA SCAN  04/05/2020    There are no preventive care reminders to display for this patient.   Lab Results  Component Value Date   TSH 1.340 08/23/2012   Lab Results  Component Value Date   WBC 9.4 11/18/2019   HGB 13.3  11/18/2019   HCT 39.7 11/18/2019   MCV 95.4 11/18/2019   PLT 292 11/18/2019   Lab Results  Component Value Date   NA 139 06/24/2020   K 4.6 06/24/2020   CO2 22 06/24/2020   GLUCOSE 67 (L) 06/24/2020   BUN 29 (H) 06/24/2020   CREATININE 1.19 06/24/2020   BILITOT 0.4 06/24/2020   ALKPHOS 63 06/24/2020   AST 13 06/24/2020   ALT 14 06/24/2020   PROT 6.6 06/24/2020   ALBUMIN 3.8 06/24/2020   CALCIUM 10.0 06/24/2020   GFR 55.02 (L) 06/24/2020   Lab Results  Component Value Date   CHOL 130 08/05/2019   Lab Results  Component Value Date   HDL 41.00 08/05/2019   Lab Results  Component Value Date   LDLCALC 69 08/05/2019   Lab Results  Component Value Date   TRIG 101.0 08/05/2019   Lab Results  Component Value Date   CHOLHDL 3 08/05/2019   Lab Results  Component Value Date   HGBA1C 6.2 06/24/2020       Assessment & Plan:   Problem List Items Addressed This Visit   None Visit Diagnoses     Open wound of toe, initial encounter    -  Primary   Relevant Medications   mupirocin ointment (BACTROBAN) 2 %  Meds ordered this encounter  Medications   mupirocin ointment (BACTROBAN) 2 %    Sig: Apply topically 2 (two) times daily.    Dispense:  22 g    Refill:  0    Order Specific Question:   Supervising Provider    Answer:   Carlota Raspberry, JEFFREY R [[id number]]    PLAN Appears fairly benign. Will give mupirocin for infection prevention. Discussed delayed wound healing in setting of htn, hld, t2dm and advanced age. Pt and caretaker express understanding.  Can consider referral to wound care if progressing, worsening, or failing to improve Patient encouraged to call clinic with any questions, comments, or concerns.  Maximiano Coss, NP

## 2020-10-01 NOTE — Patient Instructions (Signed)
Steven [name] and Steven [name] to meet you both  Wound looks ok - it's been taken care of well.  Delayed healing is somewhat to be expected with chronic conditions.   Mupirocin topically twice daily for prevention of infection  Continue to elevate, let it air out, and monitor for signs of infection or spreading.   Let me know if things get worse or don't get better.   Thank you  Rich

## 2020-10-07 ENCOUNTER — Other Ambulatory Visit: Payer: Self-pay

## 2020-10-07 DIAGNOSIS — I6521 Occlusion and stenosis of right carotid artery: Secondary | ICD-10-CM

## 2020-10-08 ENCOUNTER — Telehealth: Payer: Self-pay | Admitting: Podiatry

## 2020-10-08 NOTE — Telephone Encounter (Signed)
Pts daughter called asking about the pts diabetic shoes. We did receive the paperwork but the last office visit date is marked thru and says see note which makes it non compliant for medicare. I told her I would refax it with a note to see if we can get it signed again.   She then called back stating what is the cost and they could just pay out of pocket. I explained we have started the process thru medicare compliance.

## 2020-10-09 ENCOUNTER — Other Ambulatory Visit: Payer: Self-pay | Admitting: Family Medicine

## 2020-10-09 DIAGNOSIS — E119 Type 2 diabetes mellitus without complications: Secondary | ICD-10-CM

## 2020-10-11 ENCOUNTER — Other Ambulatory Visit: Payer: Self-pay

## 2020-10-11 ENCOUNTER — Encounter: Payer: Self-pay | Admitting: Physician Assistant

## 2020-10-11 ENCOUNTER — Ambulatory Visit (INDEPENDENT_AMBULATORY_CARE_PROVIDER_SITE_OTHER): Payer: Medicare Other | Admitting: Physician Assistant

## 2020-10-11 VITALS — BP 138/66 | HR 66 | Temp 98.0°F | Ht 69.0 in

## 2020-10-11 DIAGNOSIS — S0012XA Contusion of left eyelid and periocular area, initial encounter: Secondary | ICD-10-CM

## 2020-10-11 DIAGNOSIS — T7611XA Adult physical abuse, suspected, initial encounter: Secondary | ICD-10-CM

## 2020-10-11 DIAGNOSIS — R238 Other skin changes: Secondary | ICD-10-CM

## 2020-10-11 DIAGNOSIS — R233 Spontaneous ecchymoses: Secondary | ICD-10-CM
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
## 2020-10-13 ENCOUNTER — Ambulatory Visit (HOSPITAL_COMMUNITY)
Admission: RE | Admit: 2020-10-13 | Discharge: 2020-10-13 | Disposition: A | Discharge status: Home / Self Care | Payer: Medicare Other | Source: Ambulatory Visit | Attending: Family Medicine | Admitting: Family Medicine

## 2020-10-13 ENCOUNTER — Other Ambulatory Visit: Payer: Self-pay

## 2020-10-13 DIAGNOSIS — I6521 Occlusion and stenosis of right carotid artery: Secondary | ICD-10-CM

## 2020-10-15 NOTE — Progress Notes (Signed)
LMTCB

## 2020-10-29 ENCOUNTER — Other Ambulatory Visit: Payer: Self-pay | Admitting: Family Medicine

## 2020-11-03 ENCOUNTER — Telehealth: Payer: Self-pay

## 2020-11-03 ENCOUNTER — Telehealth: Payer: Self-pay | Admitting: Podiatry

## 2020-11-03 NOTE — Telephone Encounter (Signed)
Returned Lisa's call and lm on vm.

## 2020-11-03 NOTE — Telephone Encounter (Signed)
Is calling Keba back to get results from as vascular test.

## 2020-11-03 NOTE — Telephone Encounter (Signed)
Pts daughter [name] left message checking on pts diabetic shoes. She stated she was expecting a call when we spoke last in about 2 weeks.  I returned call and left message that we have not received the corrected documents but I have asked the company to change to self pay and get the shoes shipped asap. I did also explain there is a delay going on with the company due to the warehouse moving.

## 2020-11-08 ENCOUNTER — Ambulatory Visit: Payer: Medicare Other | Admitting: Podiatry

## 2020-11-10 ENCOUNTER — Other Ambulatory Visit: Payer: Self-pay

## 2020-11-10 ENCOUNTER — Encounter: Payer: Self-pay | Admitting: Podiatry

## 2020-11-10 ENCOUNTER — Ambulatory Visit (INDEPENDENT_AMBULATORY_CARE_PROVIDER_SITE_OTHER): Payer: Medicare Other | Admitting: Podiatry

## 2020-11-10 DIAGNOSIS — M79675 Pain in left toe(s): Secondary | ICD-10-CM | POA: Diagnosis not present

## 2020-11-10 DIAGNOSIS — M79674 Pain in right toe(s): Secondary | ICD-10-CM | POA: Diagnosis not present

## 2020-11-10 DIAGNOSIS — B351 Tinea unguium: Secondary | ICD-10-CM

## 2020-11-13 NOTE — Progress Notes (Signed)
  Subjective:  Patient ID: Steven [name], male    DOB: [birth year],  MRN: [id number]  LYZANDER KOLM presents to clinic today for at risk foot care. Pt has h/o NIDDM with PAD and painful thick toenails that are difficult to trim. Pain interferes with ambulation. Aggravating factors include wearing enclosed shoe gear. Pain is relieved with periodic professional debridement.  He is inquiring about the status of his diabetic shoes today. His daughter is present on today's visit.  PCP is Marin Olp, MD , and last visit was 06/24/2020.  Allergies  Allergen Reactions   Alendronate     reflux    Review of Systems: Negative except as noted in the HPI. Objective:   Constitutional KASAN LIGHTLE is a pleasant 87 y.o. Caucasian male, WD, WN in NAD. AAO x 3.   Vascular Capillary fill time to digits <3 seconds b/l lower extremities. Faintly palpable DP pulse(s) b/l lower extremities. Nonpalpable PT pulse(s) b/l lower extremities. Pedal hair absent. Lower extremity skin temperature gradient warm to cool. No pain with calf compression b/l. Evidence of chronic venous insufficiency b/l lower extremities. No cyanosis or clubbing noted.  Neurologic Normal speech. Oriented to person, place, and time. Protective sensation decreased with 10 gram monofilament b/l.  Dermatologic Pedal skin is thin shiny, atrophic b/l lower extremities. No open wounds b/l lower extremities. No interdigital macerations b/l lower extremities. Toenails 1-5 b/l elongated, discolored, dystrophic, thickened, crumbly with subungual debris and tenderness to dorsal palpation.  Orthopedic: Dropfoot right lower extremity. Wearing hinged AFO LLE and brace on RLE . Pes planus deformity noted b/l lower extremities.   Radiographs: None Assessment:   1. Pain due to onychomycosis of toenails of both feet    Plan:  -Examined patient. -Status of diabetic shoes. Shoes have not arrived. Dawn Rollene Fare will give them a call when the shoes  arrive. -Patient to continue soft, supportive shoe gear daily. -Toenails 1-5 b/l were debrided in length and girth with sterile nail nippers and dremel without iatrogenic bleeding.  -Patient to report any pedal injuries to medical professional immediately. -Patient/POA to call should there be question/concern in the interim.  Return in about 3 months (around 02/10/2021).  Marzetta Board, DPM

## 2020-12-01 ENCOUNTER — Telehealth: Payer: Self-pay | Admitting: Podiatry

## 2020-12-01 NOTE — Telephone Encounter (Signed)
Diabetic shoes/inserts in.. lvm for pts daughter to call to schedule an appt to pick them up.Marland Kitchen

## 2021-01-02 ENCOUNTER — Other Ambulatory Visit: Payer: Self-pay | Admitting: Family Medicine

## 2021-02-16 ENCOUNTER — Ambulatory Visit: Payer: Medicare Other | Admitting: Podiatry

## 2021-03-05 DIAGNOSIS — Z743 Need for continuous supervision: Secondary | ICD-10-CM | POA: Diagnosis not present

## 2021-03-05 DIAGNOSIS — R000 Tachycardia, unspecified: Secondary | ICD-10-CM | POA: Diagnosis not present

## 2021-03-05 DIAGNOSIS — M79605 Pain in left leg: Secondary | ICD-10-CM | POA: Diagnosis not present

## 2021-03-05 DIAGNOSIS — R52 Pain, unspecified: Secondary | ICD-10-CM | POA: Diagnosis not present

## 2021-03-05 DIAGNOSIS — R0689 Other abnormalities of breathing: Secondary | ICD-10-CM | POA: Diagnosis not present

## 2021-03-08 ENCOUNTER — Other Ambulatory Visit: Payer: Self-pay | Admitting: Internal Medicine

## 2021-03-22 ENCOUNTER — Other Ambulatory Visit: Payer: Self-pay | Admitting: Family Medicine

## 2021-03-22 ENCOUNTER — Ambulatory Visit (INDEPENDENT_AMBULATORY_CARE_PROVIDER_SITE_OTHER): Payer: Medicare Other | Admitting: Family Medicine

## 2021-03-22 ENCOUNTER — Encounter: Payer: Self-pay | Admitting: Physician Assistant

## 2021-03-22 ENCOUNTER — Ambulatory Visit (INDEPENDENT_AMBULATORY_CARE_PROVIDER_SITE_OTHER): Payer: Medicare Other | Admitting: Physician Assistant

## 2021-03-22 ENCOUNTER — Encounter: Payer: Self-pay | Admitting: Family Medicine

## 2021-03-22 ENCOUNTER — Other Ambulatory Visit: Payer: Self-pay

## 2021-03-22 VITALS — BP 102/62 | HR 65 | Ht 69.0 in

## 2021-03-22 VITALS — BP 124/64 | HR 65 | Temp 98.0°F | Ht 69.0 in

## 2021-03-22 DIAGNOSIS — R296 Repeated falls: Secondary | ICD-10-CM | POA: Diagnosis not present

## 2021-03-22 DIAGNOSIS — M25552 Pain in left hip: Secondary | ICD-10-CM

## 2021-03-22 DIAGNOSIS — M5416 Radiculopathy, lumbar region: Secondary | ICD-10-CM

## 2021-03-22 DIAGNOSIS — M21372 Foot drop, left foot: Secondary | ICD-10-CM

## 2021-03-22 DIAGNOSIS — E782 Mixed hyperlipidemia: Secondary | ICD-10-CM

## 2021-03-22 DIAGNOSIS — Z79899 Other long term (current) drug therapy: Secondary | ICD-10-CM

## 2021-03-22 DIAGNOSIS — E119 Type 2 diabetes mellitus without complications: Secondary | ICD-10-CM

## 2021-03-22 LAB — COMPREHENSIVE METABOLIC PANEL
ALT: 11 U/L (ref 0–53)
AST: 14 U/L (ref 0–37)
Albumin: 3.7 g/dL (ref 3.5–5.2)
Alkaline Phosphatase: 73 U/L (ref 39–117)
BUN: 27 mg/dL — ABNORMAL HIGH (ref 6–23)
CO2: 24 mEq/L (ref 19–32)
Calcium: 9.4 mg/dL (ref 8.4–10.5)
Chloride: 104 mEq/L (ref 96–112)
Creatinine, Ser: 1.1 mg/dL (ref 0.40–1.50)
GFR: 60.16 mL/min (ref >60.00)
Glucose, Bld: 162 mg/dL — ABNORMAL HIGH (ref 70–99)
Potassium: 4.6 mEq/L (ref 3.5–5.1)
Sodium: 136 mEq/L (ref 135–145)
Total Bilirubin: 0.3 mg/dL (ref 0.2–1.2)
Total Protein: 6.3 g/dL (ref 6.0–8.3)

## 2021-03-22 LAB — VITAMIN D 25 HYDROXY (VIT D DEFICIENCY, FRACTURES): VITD: 21.66 ng/mL — ABNORMAL LOW (ref 30.00–100.00)

## 2021-03-22 LAB — HEMOGLOBIN A1C: Hgb A1c MFr Bld: 7.5 % — ABNORMAL HIGH (ref 4.6–6.5)

## 2021-03-22 LAB — TSH: TSH: 1.65 u[IU]/mL (ref 0.35–5.50)

## 2021-03-22 LAB — VITAMIN B12: Vitamin B-12: 463 pg/mL (ref 211–911)

## 2021-03-22 MED ORDER — FREESTYLE LIBRE 14 DAY SENSOR MISC: 2.0000 | Freq: Every day | 11 refills | Status: DC

## 2021-03-22 MED ORDER — FREESTYLE LIBRE 14 DAY READER DEVI: 2.0000 | Freq: Every day | 11 refills | Status: DC
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
## 2021-03-22 NOTE — Patient Instructions (Addendum)
Good to see you today.  I'm ordering an MRI of your low back and hip.  Please let us know if you have not heard about scheduling within one week.  Do your best to help coordinate info for a potential nursing facility.  Use heat and TENs.  TENS UNIT: This is helpful for muscle pain and spasm.   Search and Purchase a TENS 7000 2nd edition at  www.tenspros.com or www.[email or web address] It should be less than $30.     TENS unit instructions: Do not shower or bathe with the unit on Turn the unit off before removing electrodes or batteries If the electrodes lose stickiness add a drop of water to the electrodes after they are disconnected from the unit and place on plastic sheet. If you continued to have difficulty, call the TENS unit company to purchase more electrodes. Do not apply lotion on the skin area prior to use. Make sure the skin is clean and dry as this will help prolong the life of the electrodes. After use, always check skin for unusual red areas, rash or other skin difficulties. If there are any skin problems, does not apply electrodes to the same area. Never remove the electrodes from the unit by pulling the wires. Do not use the TENS unit or electrodes other than as directed. Do not change electrode placement without consultating your therapist or physician. Keep 2 fingers with between each electrode. Wear time ratio is 2:1, on to off times.    For example on for 30 minutes off for 15 minutes and then on for 30 minutes off for 15 minutes    Follow-up: after your MRIs

## 2021-03-22 NOTE — Patient Instructions (Addendum)
Good to see you today! Please go to the lab for blood work and I will send results through [place].  Dr. Yong Channel has sent a Freestyle Libre Device to your pharmacy.  F/up with Dr. [name] today as scheduled.

## 2021-03-22 NOTE — Progress Notes (Signed)
Subjective:    Patient ID: Steven [name], male    DOB: 10-Jun-1930, 87 y.o.   MRN: [id number]  Chief Complaint  Patient presents with   Hip Pain    Left Hip     HPI Patient is in today for left hip pain. He is here with his sister, Kennyth Lose, and his CNA, Clista Bernhardt.   Denies any falls or injuries. Sister says he is in intense pain. She says he need an MRI. Does not ambulate without a walker. He can no longer lift himself out of the bed. Previously saw Dr. [name] and has recheck appt with him at 4 pm today.   Also concerns of low blood sugar dropping. They are wanting a [employer] sensor with meter. He is still taking Metformin 1000 mg BID   Also sister is requesting labs drawn today: CRP, D3, B12, BNP, homocysteine, thyroid, T3, T4, TSH. Not concerned about lipid panel they say.   Past Medical History:  Diagnosis Date   Abnormality of gait 08/23/2012   Anemia    hx of   Arthritis 01-03-12   osteoarthritis-knees   CAD (coronary artery disease) 2004   s/p inferior wall Mi 2004 with PTCA/Stent; s/p CABG 2004   Degenerative arthritis    Diabetes mellitus    type II   Foot drop, bilateral 08/23/2012   GERD (gastroesophageal reflux disease)    Heart disease    History of kidney stones    History of myocardial infarction    Hyperlipidemia    Myocardial infarction [facility]) 01-03-12   '04   MYOCARDIAL INFARCTION, HX OF 04/19/2007   Qualifier: Diagnosis of  By: Scherrie Gerlach     Neuropathy 01-03-12   bil. feet   Pneumonia    hx of   Polyneuropathy in diabetes(357.2) 08/23/2012   Radiculopathy of lumbar region 08/23/2011   Followed by Dr. Vertell Limber. S/p surgery. Resolved after surgery.      Past Surgical History:  Procedure Laterality Date   BACK SURGERY  2013   CARDIAC CATHETERIZATION  01-03-12   '04   CATARACT EXTRACTION Bilateral 2015   COLONOSCOPY     CORONARY ARTERY BYPASS GRAFT  2004   LIMA to LAD; SVG to ramus intermedius; SVG to PDA/PLSA   LAMINECTOMY  09/08/2011   Procedure:  LUMBAR LAMINECTOMY FOR TUMOR;  Surgeon: Erline Levine, MD;  Location: [facility];  Service: Neurosurgery;  Laterality: Left;  Left Thoracic twelve-Lumbar one transpedicular resection of epidural mass   TONSILLECTOMY  as child   TOTAL KNEE ARTHROPLASTY  01/15/2012   Procedure: TOTAL KNEE ARTHROPLASTY;  Surgeon: Gearlean Alf, MD;  Location: [facility];  Service: Orthopedics;  Laterality: Right;   TOTAL KNEE ARTHROPLASTY Left 09/16/2012   Procedure: LEFT TOTAL KNEE ARTHROPLASTY;  Surgeon: Gearlean Alf, MD;  Location: [facility];  Service: Orthopedics;  Laterality: Left;    Family History  Problem Relation Age of Onset   Cirrhosis Mother        etiology unclear   Hypertension Father    Stroke Father    Cancer Sister        breast   Stroke Maternal Grandmother    Heart disease Maternal Grandfather        MI   Stroke Paternal Grandmother    Stroke Paternal Grandfather    Anesthesia problems Neg Hx    Hypotension Neg Hx    Malignant hyperthermia Neg Hx    Pseudochol deficiency Neg Hx  Social History   Tobacco Use   Smoking status: Never   Smokeless tobacco: Never  Vaping Use   Vaping Use: Never used  Substance Use Topics   Alcohol use: No   Drug use: No     Allergies  Allergen Reactions   Alendronate     reflux    Review of Systems NEGATIVE UNLESS OTHERWISE INDICATED IN HPI      Objective:     BP 124/64   Pulse 65   Temp 98 F (36.7 C)   Ht 5\' 9"  (1.753 m)   SpO2 99%   BMI 22.89 kg/m   Wt Readings from Last 3 Encounters:  11/21/19 155 lb (70.3 kg)  11/18/19 150 lb (68 kg)  09/18/19 153 lb (69.4 kg)    BP Readings from Last 3 Encounters:  03/22/21 102/62  03/22/21 124/64  10/11/20 138/66     Physical Exam Vitals and nursing note reviewed.  Constitutional:      General: He is not in acute distress.    Comments: wheelchair  HENT:     Head: Normocephalic.  Eyes:     Extraocular Movements: Extraocular movements intact.     Pupils: Pupils are  equal, round, and reactive to light.  Cardiovascular:     Rate and Rhythm: Normal rate and regular rhythm.     Pulses: Normal pulses.     Heart sounds: Normal heart sounds.  Pulmonary:     Effort: Pulmonary effort is normal.     Breath sounds: Normal breath sounds.  Musculoskeletal:        General: Tenderness (greater left trochanter and low back) present. No signs of injury.     Left hip: Normal range of motion.  Neurological:     Mental Status: He is alert.       Assessment & Plan:   Problem List Items Addressed This Visit       Endocrine   Diabetes mellitus type II, controlled ([place])   Relevant Orders   Vitamin B12 (Completed)   Comprehensive metabolic panel (Completed)   Hemoglobin A1c (Completed)     Other   Hyperlipidemia   Relevant Orders   TSH (Completed)   Other Visit Diagnoses     High risk medication use    -  Primary   Relevant Orders   Vitamin B12 (Completed)   Left hip pain       Relevant Orders   Vitamin D (25 hydroxy) (Completed)        Meds ordered this encounter  Medications   DISCONTD: Continuous Blood Gluc Receiver (FREESTYLE LIBRE 14 DAY READER) DEVI    Sig: 2 each by Does not apply route daily.    Dispense:  2 each    Refill:  11   DISCONTD: Continuous Blood Gluc Sensor (FREESTYLE LIBRE 14 DAY SENSOR) MISC    Sig: 2 each by Does not apply route daily.    Dispense:  2 each    Refill:  11   Vitamin D, Ergocalciferol, (DRISDOL) 1.25 MG (50000 UNIT) CAPS capsule    Sig: Take 1 capsule (50,000 Units total) by mouth every 7 (seven) days.    Dispense:  12 capsule    Refill:  0    Plan: -Discussed with PCP Dr. Yong Channel today -Will plan to try to draw requested labs, although made patient and sister aware that some may not be covered by his insurance -Will also attempt to obtain [facility] Device, although, again, this may not be covered  because he is not on insulin -Encouraged patient to f/up with Dr. [name] today as scheduled about his  worsening L hip pain as he has seen him previously. I agree, based on his description, he will likely need MRI as next step.    Fenna Semel M Rylea Selway, PA-C

## 2021-03-23 MED ORDER — VITAMIN D (ERGOCALCIFEROL) 1.25 MG (50000 UNIT) PO CAPS: 50000.0000 [IU] | ORAL_CAPSULE | ORAL | 0 refills | Status: DC

## 2021-03-28 ENCOUNTER — Telehealth: Payer: Self-pay | Admitting: Family Medicine

## 2021-03-28 DIAGNOSIS — M21372 Foot drop, left foot: Secondary | ICD-10-CM

## 2021-03-28 DIAGNOSIS — M5416 Radiculopathy, lumbar region: Secondary | ICD-10-CM

## 2021-03-28 DIAGNOSIS — M25552 Pain in left hip: Secondary | ICD-10-CM

## 2021-03-28 DIAGNOSIS — R296 Repeated falls: Secondary | ICD-10-CM

## 2021-03-28 NOTE — Telephone Encounter (Signed)
Patient's sister, Kennyth Lose, called stating that the patient is in a lot of pain. She was following pup on the MRI and Xray order. I informed her that it appears [facility] had left a message for them to schedule and asked if he was able to come into the office to get the xray done. She said that with his current condition, she is worried that he would not be able to make it through the MRI due to his lack of understanding and confusion. She asked if the MRI and Xray order could be sent to the hospital to have both done and possible be put under anesthesia so that they could be done correctly.   Kennyth Lose - sister  [phone]

## 2021-03-29 NOTE — Telephone Encounter (Signed)
I called and left a message with his sister. I advised her to call back. We need to talk.   I ordered both MRIs in the hospital with sedation.

## 2021-03-30 NOTE — Telephone Encounter (Signed)
Kennyth Lose returned call. She is unavailable this afternoon and is requesting a return call from Dr. [name] around lunch time tomorrow, if possible

## 2021-03-31 NOTE — Telephone Encounter (Signed)
I called back. Continue with plan for hospital based MRI with IV sedation due to dementia.

## 2021-04-02 ENCOUNTER — Other Ambulatory Visit: Payer: Self-pay | Admitting: Family Medicine

## 2021-04-04 NOTE — Telephone Encounter (Signed)
You can submit a prior authorization if not covered please tell patient not covered

## 2021-04-04 NOTE — Telephone Encounter (Signed)
Alternative requested or would you like to do a prior authorization. Please advise.

## 2021-04-05 NOTE — Telephone Encounter (Signed)
Tried calling patient, unable to leave message, phone rang repeatedly.

## 2021-04-07 ENCOUNTER — Encounter: Payer: Self-pay | Admitting: *Deleted

## 2021-04-07 NOTE — Telephone Encounter (Signed)
PA currently under review.

## 2021-04-12 ENCOUNTER — Telehealth: Payer: Self-pay

## 2021-04-12 ENCOUNTER — Encounter: Payer: Self-pay | Admitting: Family Medicine

## 2021-04-12 ENCOUNTER — Telehealth (INDEPENDENT_AMBULATORY_CARE_PROVIDER_SITE_OTHER): Payer: Medicare Other | Admitting: Family Medicine

## 2021-04-12 VITALS — BP 168/115 | HR 72 | Temp 99.5°F | Ht 69.0 in

## 2021-04-12 DIAGNOSIS — R051 Acute cough: Secondary | ICD-10-CM | POA: Diagnosis not present

## 2021-04-12 MED ORDER — BENZONATATE 100 MG PO CAPS: 100.0000 mg | ORAL_CAPSULE | Freq: Three times a day (TID) | ORAL | 0 refills | Status: DC | PRN

## 2021-04-12 MED ORDER — AMOXICILLIN-POT CLAVULANATE 875-125 MG PO TABS: 1.0000 | ORAL_TABLET | Freq: Two times a day (BID) | ORAL | 0 refills | Status: DC

## 2021-04-12 NOTE — Patient Instructions (Signed)
Meds have been sent the the pharmacy You can take tylenol for pain/fevers If worsening symptoms, let us know or go to the Emergency room  Come get covid tested about 1:15 tomrrow

## 2021-04-12 NOTE — Progress Notes (Signed)
MyChart Video Visit    Virtual Visit via Video Note   This visit type was conducted due to national recommendations for restrictions regarding the COVID-19 Pandemic (e.g. social distancing) in an effort to limit this patient's exposure and mitigate transmission in our community. This patient is at least at moderate risk for complications without adequate follow up. This format is felt to be most appropriate for this patient at this time. Physical exam was limited by quality of the video and audio technology used for the visit. CMA was able to get the patient set up on a video visit.  Patient location: Home. Patient and provider in visit and his CNA caregiver Provider location: Office  I discussed the limitations of evaluation and management by telemedicine and the availability of in person appointments. The patient expressed understanding and agreed to proceed.  Visit Date: 04/12/2021  Today's healthcare provider: Wellington Hampshire., MD     Subjective:    Patient ID: Steven [name], male    DOB: [birth year], 87 y.o.   MRN: [id number]  Chief Complaint  Patient presents with   Cough    Started 2 days ago with mucus Can hear rattles in his chest, CNA noticed last night Diabetic cough medicine at 10 am    HPI-temp 99.5 Cough, mucus, weakness for 2 days. More confused today.  Doesn't  have pulse ox or home covid kit.  No sob Bp high today. Sugars "ok"  Doesn't go anywhere.  Wife and daughter careful when go out.  Has 2 CNA's that care for him as well.   Pt lives w/wife and daughter.   Past Medical History:  Diagnosis Date   Abnormality of gait 08/23/2012   Anemia    hx of   Arthritis 01-03-12   osteoarthritis-knees   CAD (coronary artery disease) 2004   s/p inferior wall Mi 2004 with PTCA/Stent; s/p CABG 2004   Degenerative arthritis    Diabetes mellitus    type II   Foot drop, bilateral 08/23/2012   GERD (gastroesophageal reflux disease)    Heart disease    History of  kidney stones    History of myocardial infarction    Hyperlipidemia    Myocardial infarction [facility]) 01-03-12   '04   MYOCARDIAL INFARCTION, HX OF 04/19/2007   Qualifier: Diagnosis of  By: Scherrie Gerlach     Neuropathy 01-03-12   bil. feet   Pneumonia    hx of   Polyneuropathy in diabetes(357.2) 08/23/2012   Radiculopathy of lumbar region 08/23/2011   Followed by Dr. Vertell Limber. S/p surgery. Resolved after surgery.      Past Surgical History:  Procedure Laterality Date   BACK SURGERY  2013   CARDIAC CATHETERIZATION  01-03-12   '04   CATARACT EXTRACTION Bilateral 2015   COLONOSCOPY     CORONARY ARTERY BYPASS GRAFT  2004   LIMA to LAD; SVG to ramus intermedius; SVG to PDA/PLSA   LAMINECTOMY  09/08/2011   Procedure: LUMBAR LAMINECTOMY FOR TUMOR;  Surgeon: Erline Levine, MD;  Location: [facility];  Service: Neurosurgery;  Laterality: Left;  Left Thoracic twelve-Lumbar one transpedicular resection of epidural mass   TONSILLECTOMY  as child   TOTAL KNEE ARTHROPLASTY  01/15/2012   Procedure: TOTAL KNEE ARTHROPLASTY;  Surgeon: Gearlean Alf, MD;  Location: [facility];  Service: Orthopedics;  Laterality: Right;   TOTAL KNEE ARTHROPLASTY Left 09/16/2012   Procedure: LEFT TOTAL KNEE ARTHROPLASTY;  Surgeon: Gearlean Alf, MD;  Location: [facility];  Service: Orthopedics;  Laterality: Left;    Outpatient Medications Prior to Visit  Medication Sig Dispense Refill   ACCU-CHEK GUIDE test strip USE UP TO TWICE DAILY AS DIRECTED 50 strip 3   Accu-Chek Softclix Lancets lancets USE AS DIRECTED UP TO TWICE DAILY AS DIRCTED 100 each 3   acetaminophen (TYLENOL) 500 MG tablet Take 500 mg by mouth every 6 (six) hours as needed. Up to 6 tabs (3gm) qd     aspirin 81 MG tablet Take 81 mg by mouth daily.     atorvastatin (LIPITOR) 80 MG tablet TAKE 1 TABLET DAILY. 90 tablet 3   Blood Glucose Monitoring Suppl (ACCU-CHEK GUIDE ME) w/Device KIT DISPENSE BASED ON PATIENT AND INSURANCE PREFERENCE. USE UP TO TWICE DAILY AS  DIRECTED. E11.9 1 kit 3   cycloSPORINE (RESTASIS) 0.05 % ophthalmic emulsion 1 drop 2 (two) times daily.     diclofenac sodium (VOLTAREN) 1 % GEL Apply 4 g topically 4 (four) times daily. (Patient taking differently: Apply 4 g topically in the morning and at bedtime.) 100 g 1   diltiazem (MATZIM LA) 180 MG 24 hr tablet Take 1 tablet (180 mg total) by mouth daily. 90 tablet 1   glucose blood test strip Use to test your blood sugar daily. E11.9 One touch Verio test strips 100 each 12   hydrocortisone 2.5 % cream APPLICATIONS APPLY ON THE SKIN EVERY DAY     lisinopril (ZESTRIL) 2.5 MG tablet Take a half tab daily. 45 tablet 3   metFORMIN (GLUCOPHAGE) 1000 MG tablet TAKE 1 TABLET (1,000 MG TOTAL) BY MOUTH 2 (TWO) TIMES DAILY WITH A MEAL. 180 tablet 3   metoprolol succinate (TOPROL-XL) 50 MG 24 hr tablet TAKE 1 TABLET BY MOUTH EVERY DAY 90 tablet 1   mupirocin ointment (BACTROBAN) 2 % Apply to affected area 1-2 times daily 22 g 0   omeprazole (PRILOSEC) 20 MG capsule Take 1 capsule (20 mg total) by mouth daily. (Patient taking differently: Take 20 mg by mouth as needed.) 90 capsule 3   ONETOUCH VERIO test strip USE TO TEST BLOOD SUGARS DAILY. 100 each 3   traMADol (ULTRAM) 50 MG tablet Take 1 tablet (50 mg total) by mouth every 8 (eight) hours as needed. 60 tablet 1   Vitamin D, Ergocalciferol, (DRISDOL) 1.25 MG (50000 UNIT) CAPS capsule Take 1 capsule (50,000 Units total) by mouth every 7 (seven) days. 12 capsule 0   Continuous Blood Gluc Receiver (FREESTYLE LIBRE 14 DAY READER) DEVI 2 EACH BY DOES NOT APPLY ROUTE DAILY. (Patient not taking: Reported on 04/12/2021) 1 each 11   Continuous Blood Gluc Sensor (FREESTYLE LIBRE 14 DAY SENSOR) MISC 2 EACH BY DOES NOT APPLY ROUTE DAILY. (Patient not taking: Reported on 04/12/2021) 2 each 11   mupirocin ointment (BACTROBAN) 2 % Apply topically 2 (two) times daily. 22 g 0   No facility-administered medications prior to visit.    Allergies  Allergen Reactions    Alendronate     reflux        Objective:     Physical Exam Vitals and nursing note reviewed.  Constitutional:      General: he is not in acute distress.    Appearance: frail  HENT:     Head: Normocephalic.  Pulmonary:     Effort: No respiratory distress.   Skin:    General: Skin is dry.     Coloration: Skin is not pale.  Neurological:     Mental Status: he is  alert Psychiatric:        Mood and Affect: Mood interacts   BP (!) 168/115    Pulse 72    Temp 99.5 F (37.5 C) (Oral)    Ht 5' 9" (1.753 m)    BMI 22.89 kg/m   Wt Readings from Last 3 Encounters:  11/21/19 155 lb (70.3 kg)  11/18/19 150 lb (68 kg)  09/18/19 153 lb (69.4 kg)       Assessment & Plan:   Problem List Items Addressed This Visit   None Visit Diagnoses     Acute cough    -  Primary   Relevant Orders   POC COVID-19      Pt very high risk. Per CNA-daughter wiould like for him to get started on meds.  Will do augmentin and tssalon perles,  but need to test for covid as well to make sure treating appropriately.  If worse, distress, to ER  Meds ordered this encounter  Medications   amoxicillin-clavulanate (AUGMENTIN) 875-125 MG tablet    Sig: Take 1 tablet by mouth 2 (two) times daily.    Dispense:  20 tablet    Refill:  0   benzonatate (TESSALON PERLES) 100 MG capsule    Sig: Take 1 capsule (100 mg total) by mouth 3 (three) times daily as needed for cough.    Dispense:  20 capsule    Refill:  0    I discussed the assessment and treatment plan with the patient. The patient was provided an opportunity to ask questions and all were answered. The patient agreed with the plan and demonstrated an understanding of the instructions.   The patient was advised to call back or seek an in-person evaluation if the symptoms worsen or if the condition fails to improve as anticipated.  I provided 15 minutes of face-to-face time during this encounter.   Wellington Hampshire., MD [place] [phone] (phone) [phone] (fax)  [place]

## 2021-04-12 NOTE — Telephone Encounter (Signed)
Patient is scheduled for a 3pm virtual visit.  Symptomatic / Request for Health Information Initial Comment Caller states her father has a cough, wheezing, and weakness. Translation No Nurse Assessment Nurse: Windle Guard, RN, Lesa Date/Time (Eastern Time): 04/12/2021 9:48:49 AM Confirm and document reason for call. If symptomatic, describe symptoms. ---Caller states her client has a productive cough, wheezing and weak Does the patient have any new or worsening symptoms? ---Yes Will a triage be completed? ---Yes Related visit to physician within the last 2 weeks? ---No Does the PT have any chronic conditions? (i.e. diabetes, asthma, this includes High risk factors for pregnancy, etc.) ---Yes List chronic conditions. ---Diabetes Is this a behavioral health or substance abuse call? ---No Guidelines Guideline Title Affirmed Question Affirmed Notes Nurse Date/Time (Eastern Time) Cough - Acute Productive Wheezing is present Actor, RN, Lesa 04/12/2021 9:50:45 AM Weakness (Generalized) and Fatigue [1] MODERATE weakness (i.e., interferes with work, school, normal activities) AND [2] cause unknown Actor, Therapist, sports, [place] 04/12/2021 9:55:50 AM PLEASE NOTE: All timestamps contained within this report are represented as [place] Standard Time. CONFIDENTIALTY NOTICE: This fax transmission is intended only for the addressee. It contains information that is legally privileged, confidential or otherwise protected from use or disclosure. If you are not the intended recipient, you are strictly prohibited from reviewing, disclosing, copying using or disseminating any of this information or taking any action in reliance on or regarding this information. If you have received this fax in error, please notify us immediately by telephone so that we can arrange for its return to [place]. Phone: [phone], Toll-Free: [phone], Fax: [phone] Page: 2 of 2 Call Id: [id number] Guidelines Guideline Title  Affirmed Question Affirmed Notes Nurse Date/Time Eilene Ghazi Time) (Exceptions: weakness with acute minor illness, or weakness from poor fluid intake) Disp. Time Eilene Ghazi Time) Disposition Final User 04/12/2021 9:47:13 AM Send to Urgent [facility] 04/12/2021 9:55:23 AM See HCP within 4 Hours (or PCP triage) Windle Guard, RN, Lesa 04/12/2021 10:01:58 AM See HCP within 4 Hours (or PCP triage) Yes Conner, RN, Lesa Caller Disagree/Comply Comply Caller Understands Yes PreDisposition Go to Urgent [place] Advice Given Per Guideline SEE HCP (OR PCP TRIAGE) WITHIN 4 HOURS: * IF OFFICE WILL BE OPEN: You need to be seen within the next 3 or 4 hours. Call your doctor (or NP/PA) now or as soon as the office opens. COUGH SYRUP WITH DEXTROMETHORPHAN - EXTRA NOTES AND WARNINGS: * Do not try to completely stop coughs that produce mucus and phlegm. COUGH SYRUP WITH DEXTROMETHORPHAN: * Cough syrups containing the cough suppressant dextromethorphan may help decrease your cough. * Cough syrup works best for coughs that keep you awake at night. It can also sometimes help in the late stages of a lung or airway infection when the cough is dry and hacking. Cough syrup can be used along with cough drops. * Examples: Delsym 12-hour Cough, Robitussin Cough Long-Acting, Triaminic Long-Acting, Vicks DayQuil Cough. * Before taking any medicine, read all the instructions on the package. AVOID TOBACCO SMOKE: * Avoid tobacco smoke and e-cigarettes. CALL BACK IF: * You become worse CARE ADVICE given per Cough - Acute Productive (Adult) guideline. SEE HCP (OR PCP TRIAGE) WITHIN 4 HOURS: * IF OFFICE WILL BE OPEN: You need to be seen within the next 3 or 4 hours. Call your doctor (or NP/PA) now or as soon as the office opens. BRING MEDICINES: * It is also a good idea to bring the pill bottles too. This will help the doctor to make certain  you are taking the right medicines and the right dose. CALL BACK  IF: * You become worse CARE ADVICE given per Weakness and Fatigue (Adult) guideline. Referrals REFERRED TO PCP OFFICE Warm transfer to [place]

## 2021-04-12 NOTE — Telephone Encounter (Signed)
Noted  

## 2021-04-13 ENCOUNTER — Ambulatory Visit: Payer: Medicare Other | Admitting: Family Medicine

## 2021-04-13 ENCOUNTER — Other Ambulatory Visit: Payer: Self-pay

## 2021-04-13 VITALS — HR 85

## 2021-04-13 DIAGNOSIS — U071 COVID-19: Secondary | ICD-10-CM

## 2021-04-13 LAB — POC COVID19 BINAXNOW: SARS Coronavirus 2 Ag: POSITIVE — AB

## 2021-04-13 MED ORDER — MOLNUPIRAVIR EUA 200MG CAPSULE: 4.0000 | ORAL_CAPSULE | Freq: Two times a day (BID) | ORAL | 0 refills | Status: AC

## 2021-04-13 NOTE — Progress Notes (Signed)
Patient came to the office for a POC Covid test per Dr. Cherlynn Kaiser.

## 2021-04-16 ENCOUNTER — Other Ambulatory Visit: Payer: Medicare Other

## 2021-04-16 ENCOUNTER — Inpatient Hospital Stay: Admission: RE | Admit: 2021-04-16 | Payer: Medicare Other | Source: Ambulatory Visit

## 2021-05-12 NOTE — Progress Notes (Deleted)
Cardiology Office Note:    Date:  05/12/2021   ID:  Steven [name], DOB [birth year], MRN [id number]  PCP:  Marin Olp, MD   [facility] Cardiologist:  Pixie Casino, MD { Referring MD: Marin Olp, MD   No chief complaint on file. ***  History of Present Illness:    Steven [name] is a 88 y.o. male with a hx of CAD s/p CABG in 2004 after an inferior MI, HLD, DM, PVCs, neuropathy, vasovagal syncope and L sided foot drop. Echo 11/08/2016 with LVEF 50-55%, grade 1 DD, mildly dilated ascending aorta 39 mm. Nuclear stress test 02/15/18 showed prior infarct, no ischemia.  Carotid artery ultrasound 10/10/2018 showed 40 to 59% right ICA stenosis, 1 to 39% left ICA stenosis.  Repeat study in 09/2020 showed stable disease, possible improvement in the right ICA stenosis.  PCP is following AAA, last study in May 2021 showed 4.4 x 4.44 cm which was stable from prior study.  He was last seen in clinic by Dr. Debara Pickett 03/15/20. He was sedentary and having issues with his left foot brace for is footdrop.       CAD s/p CABG 2004 Reassuring nuclear stress test 02/2018 Echo with preserved EF in 2018 Continue ASA, lisinopril, cardizem, toprol   Frequent PVCs Continue BB and CCB   Hyperlipidemia Do not feel strongly about repeating a lipid profile Stable on 80 mg lipitor   Hx of vasovagal syncope -     Past Medical History:  Diagnosis Date   Abnormality of gait 08/23/2012   Anemia    hx of   Arthritis 01-03-12   osteoarthritis-knees   CAD (coronary artery disease) 2004   s/p inferior wall Mi 2004 with PTCA/Stent; s/p CABG 2004   Degenerative arthritis    Diabetes mellitus    type II   Foot drop, bilateral 08/23/2012   GERD (gastroesophageal reflux disease)    Heart disease    History of kidney stones    History of myocardial infarction    Hyperlipidemia    Myocardial infarction [facility]) 01-03-12   '04   MYOCARDIAL INFARCTION, HX OF 04/19/2007   Qualifier: Diagnosis  of  By: Scherrie Gerlach     Neuropathy 01-03-12   bil. feet   Pneumonia    hx of   Polyneuropathy in diabetes(357.2) 08/23/2012   Radiculopathy of lumbar region 08/23/2011   Followed by Dr. Vertell Limber. S/p surgery. Resolved after surgery.      Past Surgical History:  Procedure Laterality Date   BACK SURGERY  2013   CARDIAC CATHETERIZATION  01-03-12   '04   CATARACT EXTRACTION Bilateral 2015   COLONOSCOPY     CORONARY ARTERY BYPASS GRAFT  2004   LIMA to LAD; SVG to ramus intermedius; SVG to PDA/PLSA   LAMINECTOMY  09/08/2011   Procedure: LUMBAR LAMINECTOMY FOR TUMOR;  Surgeon: Erline Levine, MD;  Location: [place] [facility];  Service: Neurosurgery;  Laterality: Left;  Left Thoracic twelve-Lumbar one transpedicular resection of epidural mass   TONSILLECTOMY  as child   TOTAL KNEE ARTHROPLASTY  01/15/2012   Procedure: TOTAL KNEE ARTHROPLASTY;  Surgeon: Gearlean Alf, MD;  Location: [facility];  Service: Orthopedics;  Laterality: Right;   TOTAL KNEE ARTHROPLASTY Left 09/16/2012   Procedure: LEFT TOTAL KNEE ARTHROPLASTY;  Surgeon: Gearlean Alf, MD;  Location: [facility];  Service: Orthopedics;  Laterality: Left;    Current Medications: No outpatient medications have been marked as taking for the 05/17/21 encounter (  Appointment) with Ledora Bottcher, [place].     Allergies:   Alendronate   Social History   Socioeconomic History   Marital status: Married    Spouse name: Not on file   Number of children: 2   Years of education: College   Highest education level: Not on file  Occupational History   Occupation: Research scientist (life sciences)  Tobacco Use   Smoking status: Never   Smokeless tobacco: Never  Vaping Use   Vaping Use: Never used  Substance and Sexual Activity   Alcohol use: No   Drug use: No   Sexual activity: Not Currently  Other Topics Concern   Not on file  Social History Narrative   Married 1956.  2 children. No grandkids. Neither children married.    1 daughter lives at house and watches over  parents.       Retired from [employer]. Worked for hisself afterwards.       Regular exercise: no   Daily Caffeine: 3 cups coffee daily   Social Determinants of Health   Financial Resource Strain: Not on file  Food Insecurity: Not on file  Transportation Needs: Not on file  Physical Activity: Not on file  Stress: Not on file  Social Connections: Not on file     Family History: The patient's ***family history includes Cancer in his sister; Cirrhosis in his mother; Heart disease in his maternal grandfather; Hypertension in his father; Stroke in his father, maternal grandmother, paternal grandfather, and paternal grandmother. There is no history of Anesthesia problems, Hypotension, Malignant hyperthermia, or Pseudochol deficiency.  ROS:   Please see the history of present illness.    *** All other systems reviewed and are negative.  EKGs/Labs/Other Studies Reviewed:    The following studies were reviewed today: ***  EKG:  EKG is *** ordered today.  The ekg ordered today demonstrates ***  Recent Labs: 03/22/2021: ALT 11; BUN 27; Creatinine, Ser 1.10; Potassium 4.6; Sodium 136; TSH 1.65  Recent Lipid Panel    Component Value Date/Time   CHOL 130 08/05/2019 1523   TRIG 101.0 08/05/2019 1523   TRIG 56 03/23/2006 0923   HDL 41.00 08/05/2019 1523   CHOLHDL 3 08/05/2019 1523   VLDL 20.2 08/05/2019 1523   LDLCALC 69 08/05/2019 1523   LDLDIRECT 74.0 02/26/2018 1424     Risk Assessment/Calculations:   {Does this patient have ATRIAL FIBRILLATION?:[id number]}       Physical Exam:    VS:  There were no vitals taken for this visit.    Wt Readings from Last 3 Encounters:  11/21/19 155 lb (70.3 kg)  11/18/19 150 lb (68 kg)  09/18/19 153 lb (69.4 kg)     GEN: *** Well nourished, well developed in no acute distress HEENT: Normal NECK: No JVD; No carotid bruits LYMPHATICS: No lymphadenopathy CARDIAC: ***RRR, no murmurs, rubs, gallops RESPIRATORY:  Clear  to auscultation without rales, wheezing or rhonchi  ABDOMEN: Soft, non-tender, non-distended MUSCULOSKELETAL:  No edema; No deformity  SKIN: Warm and dry NEUROLOGIC:  Alert and oriented x 3 PSYCHIATRIC:  Normal affect   ASSESSMENT:    No diagnosis found. PLAN:    In order of problems listed above:  ***      {Are you ordering a CV Procedure (e.g. stress test, cath, DCCV, TEE, etc)?   Press F2        :[id number]}    Medication Adjustments/Labs and Tests Ordered: Current medicines are reviewed at length with the patient today.  Concerns  regarding medicines are outlined above.  No orders of the defined types were placed in this encounter.  No orders of the defined types were placed in this encounter.   There are no Patient Instructions on file for this visit.   Signed, Ledora Bottcher, [place]  05/12/2021 8:44 AM    [facility]

## 2021-05-17 ENCOUNTER — Ambulatory Visit: Payer: Medicare Other | Admitting: Physician Assistant

## 2021-05-30 ENCOUNTER — Telehealth: Payer: Self-pay | Admitting: Family Medicine

## 2021-05-30 DIAGNOSIS — M5416 Radiculopathy, lumbar region: Secondary | ICD-10-CM

## 2021-05-30 DIAGNOSIS — M21372 Foot drop, left foot: Secondary | ICD-10-CM

## 2021-05-30 DIAGNOSIS — R296 Repeated falls: Secondary | ICD-10-CM

## 2021-05-30 DIAGNOSIS — M25552 Pain in left hip: Secondary | ICD-10-CM

## 2021-05-30 NOTE — Telephone Encounter (Signed)
Tried calling the numbers on the chart but all just kept ringing. If sister calls back she just needs to call and schedule at the hospital for those MRI's. That number is [phone]

## 2021-05-30 NOTE — Telephone Encounter (Signed)
Looks like the wrong two imaging were cancelled. Was previously sent to [facility] and they cannot do sedation. Referrals were sent to the hospital, but cancelled I believe due to the fact the [place] imaging ones were scheduled.

## 2021-05-30 NOTE — Telephone Encounter (Signed)
Pt sister called. Stated pt was supposed to have xray and MRI at the hospital as he requires sedation. Pt expected this to be done by mid January but has heard nothing. The referrals appear closed and I did not know what info to provide them for scheduling.

## 2021-05-31 NOTE — Telephone Encounter (Signed)
Spoke to patient's sister and informed about the new MRI orders.  She called to schedule at the hospital and they needed a history and physical form completed before the appointment can be scheduled.  Form completed and faxed back.

## 2021-05-31 NOTE — Telephone Encounter (Signed)
Order clarified.  We should be able to schedule it now.  Please contact the patient and let him know.

## 2021-05-31 NOTE — Addendum Note (Signed)
Addended by: Gregor Hams on: 05/31/2021 12:07 PM   Modules accepted: Orders

## 2021-06-01 ENCOUNTER — Encounter: Payer: Self-pay | Admitting: Podiatry

## 2021-06-01 ENCOUNTER — Telehealth: Payer: Self-pay

## 2021-06-01 ENCOUNTER — Ambulatory Visit: Payer: Medicare Other | Admitting: Podiatry

## 2021-06-01 ENCOUNTER — Ambulatory Visit: Payer: Medicare Other

## 2021-06-01 ENCOUNTER — Other Ambulatory Visit: Payer: Self-pay

## 2021-06-01 ENCOUNTER — Ambulatory Visit (INDEPENDENT_AMBULATORY_CARE_PROVIDER_SITE_OTHER): Payer: Medicare Other

## 2021-06-01 DIAGNOSIS — B351 Tinea unguium: Secondary | ICD-10-CM | POA: Diagnosis not present

## 2021-06-01 DIAGNOSIS — M2141 Flat foot [pes planus] (acquired), right foot: Secondary | ICD-10-CM

## 2021-06-01 DIAGNOSIS — E1151 Type 2 diabetes mellitus with diabetic peripheral angiopathy without gangrene: Secondary | ICD-10-CM

## 2021-06-01 DIAGNOSIS — M79674 Pain in right toe(s): Secondary | ICD-10-CM | POA: Diagnosis not present

## 2021-06-01 DIAGNOSIS — M7751 Other enthesopathy of right foot: Secondary | ICD-10-CM

## 2021-06-01 DIAGNOSIS — M21371 Foot drop, right foot: Secondary | ICD-10-CM

## 2021-06-01 MED ORDER — CEPHALEXIN 500 MG PO CAPS: 500.0000 mg | ORAL_CAPSULE | Freq: Four times a day (QID) | ORAL | 0 refills | Status: AC

## 2021-06-01 NOTE — Progress Notes (Signed)
Subjective:  Patient ID: Steven [name], male    DOB: [birth year],   MRN: [id number]  Chief Complaint  Patient presents with   Nail Problem    Trim nails    Foot Pain    There is a spot on the right big toe and the braces per the sister is weakening his ankles    88 y.o. male presents for concern of right foot swelling and redness that has been going on for about a month but possibly longer. Here today with caretaker and sister. Relates they are concerned for infection. Denies history of gout. Also has concern about bracing and DM shoes. Currently in [place] braces but hoping to get DM shoes.  Patient relates he is diabetic and last A1c was 7.5  . Denies any other pedal complaints. Denies n/v/f/c.   PCP: [name] MD   Past Medical History:  Diagnosis Date   Abnormality of gait 08/23/2012   Anemia    hx of   Arthritis 01-03-12   osteoarthritis-knees   CAD (coronary artery disease) 2004   s/p inferior wall Mi 2004 with PTCA/Stent; s/p CABG 2004   Degenerative arthritis    Diabetes mellitus    type II   Foot drop, bilateral 08/23/2012   GERD (gastroesophageal reflux disease)    Heart disease    History of kidney stones    History of myocardial infarction    Hyperlipidemia    Myocardial infarction [facility] 01-03-12   '04   MYOCARDIAL INFARCTION, HX OF 04/19/2007   Qualifier: Diagnosis of  By: Scherrie Gerlach     Neuropathy 01-03-12   bil. feet   Pneumonia    hx of   Polyneuropathy in diabetes(357.2) 08/23/2012   Radiculopathy of lumbar region 08/23/2011   Followed by Dr. Vertell Limber. S/p surgery. Resolved after surgery.      Objective:  Physical Exam: Vascular: DP/PT pulses 2/4 bilateral. CFT <5 seconds. Decreased hair growth and xerosis noted to bilateral feet.   Skin. No lacerations or abrasions bilateral feet. Mild erythema and edema noted to right lower extremity.  Musculoskeletal: MMT 5/5 bilateral lower extremities in PF, Inversion and Eversion. 4/5 with DF.  Deceased ROM in DF  of ankle joint.  No pain around right great toe. Some tenderness noted to dorsal midfoot.  Bilateral HAV deformity with hammered digits.  Neurological: Sensation intact to light touch. Protective sensation diminished.   Assessment:   1. Capsulitis of toe of right foot   2. Diabetes mellitus with peripheral vascular disease ([place])   3. Foot drop, right foot   4. Dermatophytosis of nail      Plan:  Patient was evaluated and treated and all questions answered. -Discussed and educated patient on diabetic foot care, especially with  regards to the vascular, neurological and musculoskeletal systems.  -Stressed the importance of good glycemic control and the detriment of not  controlling glucose levels in relation to the foot. -Discussed supportive shoes at all times and checking feet regularly.  -Will get fitted for DM shoes and discussed with [employer]is Must braces.  -Mechanically debrided all nails 1-5 bilateral using sterile nail nipper and filed with dremel without incident  -Answered all patient questions -Antibiotics sent in for possible cellulitis due to family concern.  -Patient to return  in 3 months for at risk foot care -Xrays reviewed. Midl degenerative changes noted to midfoot.  -Discussed arthirits and vascular changes and  treatement options; conservative and  Surgical management; risks, benefits, alternatives discussed. All patient's  questions answered. -Keflex sent to pharamcy  -Patient advised to call the office if any problems or questions arise in the meantime.   Lorenda Peck, DPM

## 2021-06-01 NOTE — Progress Notes (Signed)
SITUATION Reason for Consult: Evaluation for Prefabricated Diabetic Shoes and Bilateral Custom Diabetic Inserts. Patient / Caregiver Report: Patient would like well fitting shoes  OBJECTIVE DATA: Patient History / Diagnosis:    ICD-10-CM   1. Diabetes mellitus with peripheral vascular disease (HCC)  E11.51     2. Pes planus of both feet  M21.41    M21.42       Current or Previous Devices:   Historical  In-Person Foot Examination: Ulcers & Callousing:   Historical  Toe / Foot Deformities:   - Pes Planus    Shoe Size: 9.5D  ORTHOTIC RECOMMENDATION Recommended Devices: - 1x pair prefabricated PDAC approved diabetic shoes: YTRZNBVAP 014 9.5W - 3x pair custom-to-patient vacuum formed diabetic insoles.   GOALS OF SHOES AND INSOLES - Reduce shear and pressure - Reduce / Prevent callus formation - Reduce / Prevent ulceration - Protect the fragile healing compromised diabetic foot.  Patient would benefit from diabetic shoes and inserts as patient has diabetes mellitus and the patient has one or more of the following conditions: - History of partial or complete amputation of the foot - History of previous foot ulceration. - History of pre-ulcerative callus - Peripheral neuropathy with evidence of callus formation - Foot deformity - Poor circulation  ACTIONS PERFORMED Patient was casted for insoles via crush box and measured for shoes via brannock device. Procedure was explained and patient tolerated procedure well. All questions were answered and concerns addressed.  PLAN Patient is to ensure treating physician receives and completes diabetic paperwork. Casts and shoe order are to be held until paperwork is received. Once received patient is to be scheduled for fitting in four weeks.

## 2021-06-01 NOTE — Telephone Encounter (Signed)
FYI

## 2021-06-01 NOTE — Telephone Encounter (Signed)
States patient was seen today at [facility].  States they had a big mess up with loosing a provider.  States department is requesting something stating patient is diabetic.  I informed her that sometimes this is a form.  Wanted to know if they had sent a form.  Sister was very upset.  She than called over to [facility] and left a message to communicate with our office.   Sister was demanding for this to be completed today.  I have expressed to her that we would do what we could however Dr. Yong Channel is out of the office for this week.

## 2021-06-02 ENCOUNTER — Other Ambulatory Visit: Payer: Self-pay | Admitting: Family Medicine

## 2021-06-02 NOTE — Telephone Encounter (Signed)
MRI's scheduled for 06/16/2021.

## 2021-06-03 ENCOUNTER — Other Ambulatory Visit: Payer: Self-pay | Admitting: *Deleted

## 2021-06-03 ENCOUNTER — Telehealth: Payer: Self-pay | Admitting: Family Medicine

## 2021-06-03 ENCOUNTER — Encounter: Payer: Self-pay | Admitting: Family Medicine

## 2021-06-03 DIAGNOSIS — E119 Type 2 diabetes mellitus without complications: Secondary | ICD-10-CM

## 2021-06-03 MED ORDER — FREESTYLE LIBRE 2 SENSOR MISC: 11 refills | Status: DC

## 2021-06-03 NOTE — Telephone Encounter (Signed)
Letter completed and faxed to [facility]. Patient's sister notified and verbalized understanding.

## 2021-06-03 NOTE — Telephone Encounter (Signed)
Pt's sister called stating that [facility] needs a faxed verification that the patient is diabetic. Pt's diabetic shoes cannot be ordered without this information. Patient's sister wants Alyssa to fax this documentation to [facility].

## 2021-06-03 NOTE — Telephone Encounter (Signed)
Letter completed. Sister notified.

## 2021-06-03 NOTE — Telephone Encounter (Signed)
I am not sure exactly what is needed. We can sign any forms about him having diabetes.   Can send letter  To whom it may concern,   Steven [name] DOB July 15, 1930 is a patient of mine and he has diabetes.   Thanks, [name], MD

## 2021-06-09 ENCOUNTER — Telehealth: Payer: Self-pay | Admitting: Family Medicine

## 2021-06-09 NOTE — Telephone Encounter (Signed)
Jackie/sister called requesting to speak with Dr. [name] about changing the plan for [facility]. Her # [phone]  Sister had to cancel the MRI/Xray's at the hospital--her nephew ( his son? ) is gravely ill with COVID.  When she spoke with the hospital about cancelling, they notified her that Isa would be under general anesthesia. This concerned her as she thought he would be lightly sedated.  His conditioned has worsened in the past few months. Sister wonders about NOT doing the MRI's and instead getting him into rehab for 30 days OR doing Home Health PT?  Also needs something for pain, she is concerned about what he should take as he fell while taking Tramadol.  Is using TENS unit, but hard to be consistent when she is not in town.

## 2021-06-10 NOTE — Telephone Encounter (Signed)
I had a conversation with Steven [name]'s sister.  She is a Dietitian with experience who we have talked to before.  Unfortunately she is not in the area currently and trying to manage this remotely.  He has worsened some since his last visit in December and she does not think that he will be very safe or good for sedated MRI.Marland Kitchen  She would like to try to proceed with short-term rehab in a skilled nursing facility.  I think this is a reasonably good idea.  I asked her to narrow down some locations that she would like me to try applying for.  She will do so and let me know what her options are and I am happy to work with that.  Also advised that a palliative care consult may be reasonable here given that her brother is worsening.  She is not ready to consider that but that may be something to do in the future.  Happy to proceed to neck steps when I have a little more information.

## 2021-06-16 ENCOUNTER — Ambulatory Visit: Admit: 2021-06-16 | Payer: Medicare Other

## 2021-06-16 ENCOUNTER — Ambulatory Visit (HOSPITAL_COMMUNITY): Payer: Medicare Other

## 2021-06-16 ENCOUNTER — Other Ambulatory Visit (HOSPITAL_COMMUNITY): Payer: Medicare Other

## 2021-06-16 SURGERY — MRI WITH ANESTHESIA
Anesthesia: General | Laterality: Left

## 2021-07-14 ENCOUNTER — Telehealth: Payer: Self-pay

## 2021-07-14 NOTE — Telephone Encounter (Signed)
Shoes Ordered - Ortho[employer] 510 9.5W ?

## 2021-07-18 ENCOUNTER — Ambulatory Visit (INDEPENDENT_AMBULATORY_CARE_PROVIDER_SITE_OTHER): Payer: Medicare Other | Admitting: Family Medicine

## 2021-07-18 VITALS — BP 122/86 | HR 75 | Ht 69.0 in

## 2021-07-18 DIAGNOSIS — E11621 Type 2 diabetes mellitus with foot ulcer: Secondary | ICD-10-CM | POA: Diagnosis not present

## 2021-07-18 DIAGNOSIS — L97509 Non-pressure chronic ulcer of other part of unspecified foot with unspecified severity: Secondary | ICD-10-CM | POA: Diagnosis not present

## 2021-07-18 DIAGNOSIS — M79671 Pain in right foot: Secondary | ICD-10-CM | POA: Diagnosis not present

## 2021-07-18 DIAGNOSIS — M25552 Pain in left hip: Secondary | ICD-10-CM | POA: Diagnosis not present

## 2021-07-18 DIAGNOSIS — L97511 Non-pressure chronic ulcer of other part of right foot limited to breakdown of skin: Secondary | ICD-10-CM | POA: Diagnosis not present

## 2021-07-18 LAB — COMPREHENSIVE METABOLIC PANEL
ALT: 10 U/L (ref 0–53)
AST: 14 U/L (ref 0–37)
Albumin: 3.8 g/dL (ref 3.5–5.2)
Alkaline Phosphatase: 68 U/L (ref 39–117)
BUN: 22 mg/dL (ref 6–23)
CO2: 23 mEq/L (ref 19–32)
Calcium: 9.6 mg/dL (ref 8.4–10.5)
Chloride: 103 mEq/L (ref 96–112)
Creatinine, Ser: 1.17 mg/dL (ref 0.40–1.50)
GFR: 55.74 mL/min — ABNORMAL LOW (ref >60.00)
Glucose, Bld: 214 mg/dL — ABNORMAL HIGH (ref 70–99)
Potassium: 4.4 mEq/L (ref 3.5–5.1)
Sodium: 136 mEq/L (ref 135–145)
Total Bilirubin: 0.4 mg/dL (ref 0.2–1.2)
Total Protein: 6.6 g/dL (ref 6.0–8.3)

## 2021-07-18 LAB — URIC ACID: Uric Acid, Serum: 7.2 mg/dL (ref 4.0–7.8)

## 2021-07-18 LAB — HEMOGLOBIN A1C: Hgb A1c MFr Bld: 6.7 % — ABNORMAL HIGH (ref 4.6–6.5)

## 2021-07-18 MED ORDER — TRAMADOL HCL 50 MG PO TABS: 50.0000 mg | ORAL_TABLET | Freq: Three times a day (TID) | ORAL | 0 refills | Status: DC | PRN

## 2021-07-18 NOTE — Progress Notes (Signed)
? ?I, Judy Pimple, am serving as a Education administrator for Dr. [name]. ? ?Steven [name] is a 88 y.o. male who presents to [place] at [facility] today for f/u of L hip pain thought to be due to trochanteric bursitis and hip abductor tendinopathy.  He was last seen by Dr. [name] on 03/22/21 w/ worsening L hip pain and increasing weakness/ functional capacity.  He was in a WC and was not able to ambulate farther than approximately 10 ft per his home health CNA.  A discussion was had w/ the pt and his sister about getting him admitted to a SNF for inpatient rehab as attempts at home health PT were not successful/helpful.  He was also referred for an L-spine and L hip MRI to be done under sedation but was not completed.  Today, pt reports that he is in a lot of pain now in the hip. Patient is needing something to help with pain especially at night because the pain keeps him up all night long. Patient is also having Right great toe pain from the drop foot. He keeps injuring the toe from dragging his toe.  He was seen by podiatrist for this who prescribed him some antibiotics. ? ?Dx Imaging: 06/07/20 L hip XR; L-spine XR- 12/09/18 ?Pertinent review of systems: No fevers or chills ? ?Relevant historical information: Hypertension.  Diabetes. ? ? ?Exam:  ?BP 122/86   Pulse 75   Ht '5\' 9"'$  (1.753 m)   SpO2 98%   BMI 22.89 kg/m?  ?General: Well Developed, well nourished, and in no acute distress.  ? ?MSK: Left hip normal-appearing ?Tender palpation at greater trochanter. ? ?Right great toe swollen and mildly erythematous. ?Unstageable pressure ulcer dorsal toe versus healing abrasion. ? ? ? ?Lab and Radiology Results ?Hip greater trochanteric injection: Left ?Consent obtained and timeout performed. ?Area of maximum tenderness palpated and identified. ?Skin cleaned with alcohol, cold spray applied. ?A 22-gauge needle was used to access the greater trochanteric bursa. ?'40mg'$  of Kenalog and 2 mL of Marcaine were  used to inject the trochanteric bursa. ?Patient tolerated the procedure well. ? ?X-ray images right great toe obtained on June 01, 2021 personally and independently interpreted.  No acute fractures are visible. ? ? ?Assessment and Plan: ?88 y.o. male with  ?Left lateral hip pain thought to be greater trochanteric bursitis and tendinitis.  Plan for steroid injection at the greater trochanter bursa today.  Plan also for limited tramadol for severe pain.  Cautioned against confusion with this medication.  Recommend using it very sparingly along with Tylenol.  Check back in 1 month. ? ?Wound and pain right greater toe. ?This could be gout but I also am concerned that he seems to have a wound on his great toe that is not healing well.  He is at great risk for developing pressure ulcers that do not heal well.  Plan for limited lab work-up to check uric acid and evaluate for possibility of gout.  Also refer to wound care. ? ?Sent for checking labs we will recheck metabolic panel as its been a while and check his A1c since that is also due. ? ? ?PDMP reviewed during this encounter. ?Orders Placed This Encounter  ?Procedures  ? Comprehensive metabolic panel  ?  Standing Status:   Future  ?  Number of Occurrences:   1  ?  Standing Expiration Date:   07/19/2022  ? Uric acid  ?  Standing Status:   Future  ?  Number of Occurrences:   1  ?  Standing Expiration Date:   07/19/2022  ? HgB A1c  ?  Standing Status:   Future  ?  Number of Occurrences:   1  ?  Standing Expiration Date:   07/19/2022  ? Ambulatory referral to Wound Clinic  ?  Referral Priority:   Routine  ?  Referral Type:   Consultation  ?  Referral Reason:   Specialty Services Required  ?  Requested Specialty:   Wound Care  ?  Number of Visits Requested:   1  ? ?Meds ordered this encounter  ?Medications  ? traMADol (ULTRAM) 50 MG tablet  ?  Sig: Take 1 tablet (50 mg total) by mouth every 8 (eight) hours as needed.  ?  Dispense:  15 tablet  ?  Refill:  0  ? ? ? ?Discussed  warning signs or symptoms. Please see discharge instructions. Patient expresses understanding. ? ? ?The above documentation has been reviewed and is accurate and complete [name], M.D. ? ? ?

## 2021-07-18 NOTE — Patient Instructions (Addendum)
Good to see you ?Get labs on your way out ?Injection given in left hip today ?Call or go to the ER if you develop a large red swollen joint with extreme pain or oozing puss.  ?Tramadol sent to pharmacy for pain  ?Referral placed to wound care for the toe ulcer  ?Check back in a month  ? ? ?

## 2021-07-19 NOTE — Progress Notes (Signed)
Uric acid is mildly elevated at 7.2.  This indicates that gout is possible but less likely.  I think the toe pain is probably due to the ulcer and should be treated with wound care.  If not better I will start gout medicine.  Please keep me updated.  We will follow-up this in a month.  I will send the kidney labs and the diabetes labs to Dr. Yong Channel your primary care provider.  These are improved.

## 2021-07-26 ENCOUNTER — Telehealth: Payer: Self-pay

## 2021-07-26 NOTE — Telephone Encounter (Signed)
LVM FOR PT TO CALL BACK TO SCHEDULE DB SHOE PICK UP

## 2021-07-27 ENCOUNTER — Encounter (HOSPITAL_BASED_OUTPATIENT_CLINIC_OR_DEPARTMENT_OTHER): Payer: Medicare Other | Admitting: Physician Assistant

## 2021-08-01 ENCOUNTER — Encounter (HOSPITAL_BASED_OUTPATIENT_CLINIC_OR_DEPARTMENT_OTHER): Payer: Medicare Other | Attending: General Surgery | Admitting: General Surgery

## 2021-08-01 DIAGNOSIS — M21372 Foot drop, left foot: Secondary | ICD-10-CM | POA: Diagnosis not present

## 2021-08-01 DIAGNOSIS — L97521 Non-pressure chronic ulcer of other part of left foot limited to breakdown of skin: Secondary | ICD-10-CM | POA: Insufficient documentation

## 2021-08-01 DIAGNOSIS — I2510 Atherosclerotic heart disease of native coronary artery without angina pectoris: Secondary | ICD-10-CM | POA: Diagnosis not present

## 2021-08-01 DIAGNOSIS — E1140 Type 2 diabetes mellitus with diabetic neuropathy, unspecified: Secondary | ICD-10-CM | POA: Insufficient documentation

## 2021-08-01 DIAGNOSIS — E11621 Type 2 diabetes mellitus with foot ulcer: Secondary | ICD-10-CM | POA: Diagnosis not present

## 2021-08-01 NOTE — Progress Notes (Signed)
SENDER, RUEB ([id number]) ?Visit Report for 08/01/2021 ?Allergy List Details ?Patient Name: Date of Service: ?[place], [name] 08/01/2021 1:30 PM ?Medical Record Number: [id number] ?Patient Account Number: [id number] ?Date of Birth/Sex: Treating RN: ?Dec 12, 1930 (88 y.o. Steven [name] ?Primary Care Zakariyah Freimark: [name] Other Clinician: ?Referring Britta Louth: ?Treating Marielena Harvell/Extender: Fredirick Maudlin ?[name] ?Weeks in Treatment: 0 ?Allergies ?Active Allergies ?alendronate sodium ?Allergy Notes ?Electronic Signature(s) ?Signed: 08/01/2021 5:45:33 PM By: Levan Hurst RN, BSN ?Entered By: Levan Hurst on 08/01/2021 14:16:36 ?-------------------------------------------------------------------------------- ?Arrival Information Details ?Patient Name: Date of Service: ?[place], [name] 08/01/2021 1:30 PM ?Medical Record Number: [id number] ?Patient Account Number: [id number] ?Date of Birth/Sex: Treating RN: ?01/11/1931 (88 y.o. Steven [name] ?Primary Care Dorean Daniello: [name] Other Clinician: ?Referring Rheda Kassab: ?Treating Sareen Randon/Extender: Fredirick Maudlin ?[name] ?Weeks in Treatment: 0 ?Visit Information ?Patient Arrived: Wheel Chair ?Arrival Time: 14:11 ?Accompanied By: daughter ?Transfer Assistance: Manual ?Patient Identification Verified: Yes ?Secondary Verification Process Completed: Yes ?Patient Requires Transmission-Based Precautions: No ?Patient Has Alerts: No ?Electronic Signature(s) ?Signed: 08/01/2021 5:45:33 PM By: Levan Hurst RN, BSN ?Entered By: Levan Hurst on 08/01/2021 14:14:23 ?-------------------------------------------------------------------------------- ?Clinic Level of Care Assessment Details ?Patient Name: Date of Service: ?[place], [name] 08/01/2021 1:30 PM ?Medical Record Number: [id number] ?Patient Account Number: [id number] ?Date of Birth/Sex: Treating RN: ?1930/04/28 (88 y.o. Steven [name] ?Primary Care Joshuajames Moehring: [name] Other  Clinician: ?Referring Momoka Stringfield: ?Treating Eron Goble/Extender: Fredirick Maudlin ?[name] ?Weeks in Treatment: 0 ?Clinic Level of Care Assessment Items ?TOOL 2 Quantity Score ?X- 1 0 ?Use when only an EandM is performed on the INITIAL visit ?ASSESSMENTS - Nursing Assessment / Reassessment ?X- 1 20 ?General Physical Exam (combine w/ comprehensive assessment (listed just below) when performed on new pt. evals) ?X- 1 25 ?Comprehensive Assessment (HX, ROS, Risk Assessments, Wounds Hx, etc.) ?ASSESSMENTS - Wound and Skin A ssessment / Reassessment ?X - Simple Wound Assessment / Reassessment - one wound 1 5 ?'[]'$  - 0 ?Complex Wound Assessment / Reassessment - multiple wounds ?'[]'$  - 0 ?Dermatologic / Skin Assessment (not related to wound area) ?ASSESSMENTS - Ostomy and/or Continence Assessment and Care ?'[]'$  - 0 ?Incontinence Assessment and Management ?'[]'$  - 0 ?Ostomy Care Assessment and Management (repouching, etc.) ?PROCESS - Coordination of Care ?X - Simple Patient / Family Education for ongoing care 1 15 ?'[]'$  - 0 ?Complex (extensive) Patient / Family Education for ongoing care ?X- 1 10 ?Staff obtains Consents, Records, T Results / Process Orders ?est ?'[]'$  - 0 ?Staff telephones HHA, Nursing Homes / Clarify orders / etc ?'[]'$  - 0 ?Routine Transfer to another Facility (non-emergent condition) ?'[]'$  - 0 ?Routine Hospital Admission (non-emergent condition) ?X- 1 15 ?New Admissions / Biomedical engineer / Ordering NPWT Apligraf, etc. ?, ?'[]'$  - 0 ?Emergency Hospital Admission (emergent condition) ?X- 1 10 ?Simple Discharge Coordination ?'[]'$  - 0 ?Complex (extensive) Discharge Coordination ?PROCESS - Special Needs ?'[]'$  - 0 ?Pediatric / Minor Patient Management ?'[]'$  - 0 ?Isolation Patient Management ?'[]'$  - 0 ?Hearing / Language / Visual special needs ?'[]'$  - 0 ?Assessment of Community assistance (transportation, D/C planning, etc.) ?'[]'$  - 0 ?Additional assistance / Altered mentation ?'[]'$  - 0 ?Support Surface(s) Assessment (bed, cushion,  seat, etc.) ?INTERVENTIONS - Wound Cleansing / Measurement ?X- 1 5 ?Wound Imaging (photographs - any number of wounds) ?'[]'$  - 0 ?Wound Tracing (instead of photographs) ?X- 1 5 ?Simple Wound Measurement - one wound ?'[]'$  - 0 ?Complex Wound Measurement - multiple wounds ?X- 1 5 ?Simple Wound Cleansing - one wound ?'[]'$  - 0 ?  Complex Wound Cleansing - multiple wounds ?INTERVENTIONS - Wound Dressings ?X - Small Wound Dressing one or multiple wounds 1 10 ?'[]'$  - 0 ?Medium Wound Dressing one or multiple wounds ?'[]'$  - 0 ?Large Wound Dressing one or multiple wounds ?'[]'$  - 0 ?Application of Medications - injection ?INTERVENTIONS - Miscellaneous ?'[]'$  - 0 ?External ear exam ?'[]'$  - 0 ?Specimen Collection (cultures, biopsies, blood, body fluids, etc.) ?'[]'$  - 0 ?Specimen(s) / Culture(s) sent or taken to Lab for analysis ?'[]'$  - 0 ?Patient Transfer (multiple staff / Civil Service fast streamer / Similar devices) ?'[]'$  - 0 ?Simple Staple / Suture removal (25 or less) ?'[]'$  - 0 ?Complex Staple / Suture removal (26 or more) ?'[]'$  - 0 ?Hypo / Hyperglycemic Management (close monitor of Blood Glucose) ?X- 1 15 ?Ankle / Brachial Index (ABI) - do not check if billed separately ?Has the patient been seen at the hospital within the last three years: Yes ?Total Score: 140 ?Level Of Care: New/Established - Level 4 ?Electronic Signature(s) ?Signed: 08/01/2021 5:45:33 PM By: Levan Hurst RN, BSN ?Entered By: Levan Hurst on 08/01/2021 16:08:13 ?-------------------------------------------------------------------------------- ?Encounter Discharge Information Details ?Patient Name: Date of Service: ?[place], [name] 08/01/2021 1:30 PM ?Medical Record Number: [id number] ?Patient Account Number: [id number] ?Date of Birth/Sex: Treating RN: ?December 30, 1930 (88 y.o. Steven [name] ?Primary Care Manuelita Moxon: [name] Other Clinician: ?Referring Jilliana Burkes: ?Treating Nyshaun Standage/Extender: Fredirick Maudlin ?[name] ?Weeks in Treatment: 0 ?Encounter Discharge Information  Items ?Discharge Condition: Stable ?Ambulatory Status: Wheelchair ?Discharge Destination: Home ?Transportation: Private Auto ?Accompanied By: daughter ?Schedule Follow-up Appointment: Yes ?Clinical Summary of Care: Patient Declined ?Electronic Signature(s) ?Signed: 08/01/2021 5:45:33 PM By: Levan Hurst RN, BSN ?Entered By: Levan Hurst on 08/01/2021 16:08:58 ?-------------------------------------------------------------------------------- ?Lower Extremity Assessment Details ?Patient Name: Date of Service: ?[place], [name] 08/01/2021 1:30 PM ?Medical Record Number: [id number] ?Patient Account Number: [id number] ?Date of Birth/Sex: Treating RN: ?1930-08-27 (88 y.o. Steven [name] ?Primary Care Rosezetta Balderston: [name] Other Clinician: ?Referring Remmington Urieta: ?Treating Pamala Hayman/Extender: Fredirick Maudlin ?[name] ?Weeks in Treatment: 0 ?Edema Assessment ?Assessed: [Left: No] [Right: No] ?E[Left: dema] [Right: :] ?Calf ?Left: Right: ?Point of Measurement: From Medial Instep 30.3 cm ?Ankle ?Left: Right: ?Point of Measurement: From Medial Instep 20 cm ?Vascular Assessment ?Pulses: ?Dorsalis Pedis ?Palpable: [Right:Yes] ?Blood Pressure: ?Brachial: [Right:148] ?Ankle: ?[Right:Dorsalis Pedis: 152 1.03] ?Electronic Signature(s) ?Signed: 08/01/2021 5:45:33 PM By: Levan Hurst RN, BSN ?Entered By: Levan Hurst on 08/01/2021 14:35:32 ?-------------------------------------------------------------------------------- ?Multi Wound Chart Details ?Patient Name: ?Date of Service: ?[place], [name] 08/01/2021 1:30 PM ?Medical Record Number: [id number] ?Patient Account Number: [id number] ?Date of Birth/Sex: ?Treating RN: ?17-Apr-1931 (88 y.o. M) ?Primary Care Hamilton Marinello: [name] ?Other Clinician: ?Referring Nakina Spatz: ?Treating Morad Tal/Extender: Fredirick Maudlin ?[name] ?Weeks in Treatment: 0 ?Vital Signs ?Height(in): 66 ?Pulse(bpm): 63 ?Weight(lbs): 150 ?Blood Pressure(mmHg): 148/70 ?Body Mass Index(BMI):  24.2 ?Temperature(??F): 98.4 ?Respiratory Rate(breaths/min): 18 ?Photos: [N/A:N/A] ?Right, Dorsal T Great [place]oe N/A N/A ?Wound Location: ?Gradually Appeared N/A N/A ?Wounding Event: ?T be determined ?o N/A N/A ?Primary Etiology: ?Anemia,

## 2021-08-01 NOTE — Progress Notes (Signed)
ELMIN, WIEDERHOLT ([id number]) ?Visit Report for 08/01/2021 ?Abuse Risk Screen Details ?Patient Name: Date of Service: ?[place], GERA LD S. 08/01/2021 1:30 PM ?Medical Record Number: [id number] ?Patient Account Number: [id number] ?Date of Birth/Sex: Treating RN: ?[birth year] (88 y.o. Steven [name] ?Primary Care Loida Calamia: [name] Other Clinician: ?Referring Gilma Bessette: ?Treating Seanna Sisler/Extender: Fredirick Maudlin ?[name] ?Weeks in Treatment: 0 ?Abuse Risk Screen Items ?Answer ?ABUSE RISK SCREEN: ?Has anyone close to you tried to hurt or harm you recentlyo No ?Do you feel uncomfortable with anyone in your familyo No ?Has anyone forced you do things that you didnt want to doo No ?Electronic Signature(s) ?Signed: 08/01/2021 5:45:33 PM By: Levan Hurst RN, BSN ?Entered By: Levan Hurst on 08/01/2021 14:45:28 ?-------------------------------------------------------------------------------- ?Activities of Daily Living Details ?Patient Name: Date of Service: ?[place], GERA LD S. 08/01/2021 1:30 PM ?Medical Record Number: [id number] ?Patient Account Number: [id number] ?Date of Birth/Sex: Treating RN: ?January 01, 1931 (88 y.o. Steven [name] ?Primary Care Tarek Cravens: [name] Other Clinician: ?Referring Brenda Cowher: ?Treating Dareion Kneece/Extender: Fredirick Maudlin ?[name] ?Weeks in Treatment: 0 ?Activities of Daily Living Items ?Answer ?Activities of Daily Living (Please select one for each item) ?Drive Automobile Not Able ?T Medications ?ake Need Assistance ?Use T elephone Need Assistance ?Care for Appearance Need Assistance ?Use T oilet Need Assistance ?Bath / Shower Need Assistance ?Dress Self Need Assistance ?Feed Self Completely Able ?Walk Need Assistance ?Get In / Out Bed Need Assistance ?Housework Need Assistance ?Prepare Meals Need Assistance ?Handle Money Need Assistance ?Shop for Self Need Assistance ?Electronic Signature(s) ?Signed: 08/01/2021 5:45:33 PM By: Levan Hurst RN, BSN ?Entered By: Levan Hurst on 08/01/2021 14:29:37 ?-------------------------------------------------------------------------------- ?Education Screening Details ?Patient Name: ?Date of Service: ?[place], GERA LD S. 08/01/2021 1:30 PM ?Medical Record Number: [id number] ?Patient Account Number: [id number] ?Date of Birth/Sex: ?Treating RN: ?02-Jun-1930 (88 y.o. Steven [name] ?Primary Care Key C[place]: [name] ?Other Clinician: ?Referring Pennye Beeghly: ?Treating Rielle Schlauch/Extender: Fredirick Maudlin ?[name] ?Weeks in Treatment: 0 ?Primary Learner Assessed: Patient ?Learning Preferences/Education Level/Primary Language ?Learning Preference: Explanation, Demonstration, Printed Material ?Highest Education Level: High School ?Preferred Language: English ?Cognitive Barrier ?Language Barrier: No ?Translator Needed: No ?Memory Deficit: No ?Emotional Barrier: No ?Cultural/Religious Beliefs Affecting Medical Care: No ?Physical Barrier ?Impaired Vision: No ?Impaired Hearing: No ?Decreased Hand dexterity: No ?Knowledge/Comprehension ?Knowledge Level: High ?Comprehension Level: High ?Ability to understand written instructions: High ?Ability to understand verbal instructions: High ?Motivation ?Anxiety Level: Calm ?Cooperation: Cooperative ?Education Importance: Acknowledges Need ?Interest in Health Problems: Asks Questions ?Perception: Coherent ?Willingness to Engage in Self-Management High ?Activities: ?Readiness to Engage in Self-Management High ?Activities: ?Electronic Signature(s) ?Signed: 08/01/2021 5:45:33 PM By: Levan Hurst RN, BSN ?Entered By: Levan Hurst on 08/01/2021 14:29:55 ?-------------------------------------------------------------------------------- ?Fall Risk Assessment Details ?Patient Name: ?Date of Service: ?[place], GERA LD S. 08/01/2021 1:30 PM ?Medical Record Number: [id number] ?Patient Account Number: [id number] ?Date of Birth/Sex: ?Treating RN: ?11/26/1930 (88 y.o. Steven [name] ?Primary Care Jadi Deyarmin: [name] ?Other Clinician: ?Referring Bingham Millette: ?Treating Lou Loewe/Extender: Fredirick Maudlin ?[name] ?Weeks in Treatment: 0 ?Fall Risk Assessment Items ?Have you had 2 or more falls in the last 12 monthso 0 Yes ?Have you had any fall that resulted in injury in the last 12 monthso 0 No ?FALLS RISK SCREEN ?History of falling - immediate or within 3 months 0 No ?Secondary diagnosis (Do you have 2 or more medical diagnoseso) 15 Yes ?Ambulatory aid ?None/bed rest/wheelchair/nurse 0 No ?Crutches/cane/walker 15 Yes ?Furniture 0 No ?Intravenous therapy Access/Saline/Heparin Lock 0 No ?Gait/Transferring ?Normal/ bed rest/ wheelchair 0 Yes ?Weak (short steps with  or without shuffle, stooped but able to lift head while walking, may seek 0 No ?support from furniture) ?Impaired (short steps with shuffle, may have difficulty arising from chair, head down, impaired 0 No ?balance) ?Mental Status ?Oriented to own ability 0 Yes ?Electronic Signature(s) ?Signed: 08/01/2021 5:45:33 PM By: Levan Hurst RN, BSN ?Entered By: Levan Hurst on 08/01/2021 14:30:12 ?-------------------------------------------------------------------------------- ?Foot Assessment Details ?Patient Name: ?Date of Service: ?[place], GERA LD S. 08/01/2021 1:30 PM ?Medical Record Number: [id number] ?Patient Account Number: [id number] ?Date of Birth/Sex: ?Treating RN: ?Feb 21, 1931 (88 y.o. Steven [name] ?Primary Care Jerl Munyan: [name] ?Other Clinician: ?Referring Rylan Bernard: ?Treating Chidubem Chaires/Extender: Fredirick Maudlin ?[name] ?Weeks in Treatment: 0 ?Foot Assessment Items ?Site Locations ?+ = Sensation present, - = Sensation absent, C = Callus, U = Ulcer ?R = Redness, W = Warmth, M = Maceration, PU = Pre-ulcerative lesion ?F = Fissure, S = Swelling, D = Dryness ?Assessment ?Right: Left: ?Other Deformity: No No ?Prior Foot Ulcer: No No ?Prior Amputation: No No ?Charcot Joint: No No ?Ambulatory Status: Ambulatory With Help ?Assistance Device:  Wheelchair ?Gait: Unsteady ?Electronic Signature(s) ?Signed: 08/01/2021 5:45:33 PM By: Levan Hurst RN, BSN ?Entered By: Levan Hurst on 08/01/2021 14:36:42 ?-------------------------------------------------------------------------------- ?Nutrition Risk Screening Details ?Patient Name: ?Date of Service: ?[place], GERA LD S. 08/01/2021 1:30 PM ?Medical Record Number: [id number] ?Patient Account Number: [id number] ?Date of Birth/Sex: ?Treating RN: ?07/25/1930 (88 y.o. Steven [name] ?Primary Care Mozell Haber: [name] ?Other Clinician: ?Referring Damiya Sandefur: ?Treating Athea Haley/Extender: Fredirick Maudlin ?[name] ?Weeks in Treatment: 0 ?Height (in): 66 ?Weight (lbs): 150 ?Body Mass Index (BMI): 24.2 ?Nutrition Risk Screening Items ?Score Screening ?NUTRITION RISK SCREEN: ?I have an illness or condition that made me change the kind and/or amount of food I eat 2 Yes ?I eat fewer than two meals per day 0 No ?I eat few fruits and vegetables, or milk products 0 No ?I have three or more drinks of beer, liquor or wine almost every day 0 No ?I have tooth or mouth problems that make it hard for me to eat 0 No ?I don't always have enough money to buy the food I need 0 No ?I eat alone most of the time 0 No ?I take three or more different prescribed or over-the-counter drugs a day 1 Yes ?Without wanting to, I have lost or gained 10 pounds in the last six months 0 No ?I am not always physically able to shop, cook and/or feed myself 2 Yes ?Nutrition Protocols ?Good Risk Protocol ?Moderate Risk Protocol 0 Provide education on nutrition ?High Risk Proctocol ?Risk Level: Moderate Risk ?Score: 5 ?Electronic Signature(s) ?Signed: 08/01/2021 5:45:33 PM By: Levan Hurst RN, BSN ?Entered By: Levan Hurst on 08/01/2021 14:30:26 ?

## 2021-08-01 NOTE — Progress Notes (Signed)
Steven [name] ([id number]) ?Visit Report for 08/01/2021 ?Chief Complaint Document Details ?Patient Name: Date of Service: ?[name], [name] 08/01/2021 1:30 PM ?Medical Record Number: [id number] ?Patient Account Number: [id number] ?Date of Birth/Sex: Treating RN: ?[birth year] (88 y.o. M) ?Primary Care Provider: Garret [name] Other Clinician: ?Referring Provider: ?Treating Provider/Extender: Steven [name] ?Steven [name] ?Weeks in Treatment: 0 ?Information Obtained from: Patient ?Chief Complaint ?Patients presents for treatment of an open diabetic ulcer ?Electronic Signature(s) ?Signed: 08/01/2021 3:03:50 PM By: Steven [name] ?Entered By: Steven [name] on 08/01/2021 15:03:49 ?-------------------------------------------------------------------------------- ?HPI Details ?Patient Name: Date of Service: ?[name], [name] 08/01/2021 1:30 PM ?Medical Record Number: [id number] ?Patient Account Number: [id number] ?Date of Birth/Sex: Treating RN: ?Aug 20, 1930 (88 y.o. M) ?Primary Care Provider: Garret [name] Other Clinician: ?Referring Provider: ?Treating Provider/Extender: Steven [name] ?Steven [name] ?Weeks in Treatment: 0 ?History of Present Illness ?HPI Description: CONSULT ONLY ?08/01/2021 ?This is an 88 year old man with a past medical history significant for fairly extensive vascular disease as well as type 2 diabetes. His diabetes is very well ?controlled with his last A1c being 6.7. He apparently has had a red swollen right great toe since February. He was seen by podiatry who did not comment on ?any sort of ulcer wound. They did give him Keflex for capsulitis of the right great toe. An x-ray performed in their clinic was reported as showing no signs of ?osteomyelitis. He developed a small sore on the knuckle. Due to some conflicting information from home health providers and concern from a sister who lives ?out of state, the patient's daughter who has healthcare power of attorney, made an  appointment in the wound care center for an evaluation. ?The intake nurse identified a small scab on the dorsal surface of his right great toe. When she removed this, there was a pinpoint opening in the skin without ?any erythema, induration, or purulent drainage. It is perhaps a millimeter at maximum diameter. ?Electronic Signature(s) ?Signed: 08/01/2021 3:06:57 PM By: Steven [name] ?Entered By: Steven [name] on 08/01/2021 15:06:57 ?-------------------------------------------------------------------------------- ?Physical Exam Details ?Patient Name: Date of Service: ?[name], [name] 08/01/2021 1:30 PM ?Medical Record Number: [id number] ?Patient Account Number: [id number] ?Date of Birth/Sex: Treating RN: ?[birth year] (88 y.o. M) ?Primary Care Provider: Garret [name] Other Clinician: ?Referring Provider: ?Treating Provider/Extender: Steven [name] ?Steven [name] ?Weeks in Treatment: 0 ?Constitutional ?. . . . No acute distress. ?Respiratory ?Normal work of breathing on room air.Marland Kitchen ?Notes ?08/01/2021: On the dorsal aspect of his right great toe, there is a tiny pinpoint opening in the skin. No erythema, induration, or purulent drainage identified. ?Electronic Signature(s) ?Signed: 08/01/2021 3:09:05 PM By: Steven [name] ?Entered By: Steven [name] on 08/01/2021 15:09:05 ?-------------------------------------------------------------------------------- ?Physician Orders Details ?Patient Name: Date of Service: ?[name], [name] 08/01/2021 1:30 PM ?Medical Record Number: [id number] ?Patient Account Number: [id number] ?Date of Birth/Sex: Treating RN: ?07-Oct-1930 (88 y.o. Steven [name] ?Primary Care Provider: Garret [name] Other Clinician: ?Referring Provider: ?Treating Provider/Extender: Steven [name] ?Steven [name] ?Weeks in Treatment: 0 ?Verbal / Phone Orders: No ?Diagnosis Coding ?ICD-10 Coding ?Code Description ?L97.521 Non-pressure chronic ulcer of other part of left foot limited to  breakdown of skin ?E11.621 Type 2 diabetes mellitus with foot ulcer ?M21.372 Foot drop, left foot ?Follow-up Appointments ?Other: - No need for follow up unless pinhole does not close ?Bathing/ Shower/ Hygiene ?May shower and wash wound with soap and water. ?Off-Loading ?Other: - Make sure not to wear any tight fitting shoes ?Wound Treatment ?Wound #1 - T  Great ?oe Wound Laterality: Dorsal, Right ?Cleanser: Soap and Water 1 x Per Day/15 Days ?Discharge Instructions: May shower and wash wound with dial antibacterial soap and water prior to dressing change. ?Prim Dressing: Triple Antibiotic Ointment, 0.9 (g) packet ?ary 1 x Per Day/15 Days ?Secondary Dressing: Bandaid 1 x Per LAG/53 Days ?Electronic Signature(s) ?Signed: 08/01/2021 3:26:03 PM By: Steven [name] ?Entered By: Steven [name] on 08/01/2021 15:09:35 ?-------------------------------------------------------------------------------- ?Problem List Details ?Patient Name: ?Date of Service: ?[name], [name] 08/01/2021 1:30 PM ?Medical Record Number: [id number] ?Patient Account Number: [id number] ?Date of Birth/Sex: ?Treating RN: ?1930/09/17 (88 y.o. M) ?Primary Care Provider: Garret [name] ?Other Clinician: ?Referring Provider: ?Treating Provider/Extender: Steven [name] ?Steven [name] ?Weeks in Treatment: 0 ?Active Problems ?ICD-10 ?Encounter ?Code Description Active Date MDM ?Diagnosis ?L97.521 Non-pressure chronic ulcer of other part of left foot limited to breakdown of 08/01/2021 No Yes ?skin ?E11.621 Type 2 diabetes mellitus with foot ulcer 08/01/2021 No Yes ?M21.372 Foot drop, left foot 08/01/2021 No Yes ?Inactive Problems ?Resolved Problems ?Electronic Signature(s) ?Signed: 08/01/2021 3:02:18 PM By: Steven [name] ?Previous Signature: 08/01/2021 2:54:53 PM Version By: Steven [name] ?Entered By: Steven [name] on 08/01/2021 15:02:18 ?-------------------------------------------------------------------------------- ?Progress  Note Details ?Patient Name: ?Date of Service: ?[name], [name] 08/01/2021 1:30 PM ?Medical Record Number: [id number] ?Patient Account Number: [id number] ?Date of Birth/Sex: ?Treating RN: ?11-07-1930 (88 y.o. M) ?Primary Care Provider: Garret [name] ?Other Clinician: ?Referring Provider: ?Treating Provider/Extender: Steven [name] ?Steven [name] ?Weeks in Treatment: 0 ?Subjective ?Chief Complaint ?Information obtained from Patient ?Patients presents for treatment of an open diabetic ulcer ?History of Present Illness (HPI) ?CONSULT ONLY ?08/01/2021 ?This is an 88 year old man with a past medical history significant for fairly extensive vascular disease as well as type 2 diabetes. His diabetes is very well ?controlled with his last A1c being 6.7. He apparently has had a red swollen right great toe since February. He was seen by podiatry who did not comment on ?any sort of ulcer wound. They did give him Keflex for capsulitis of the right great toe. An x-ray performed in their clinic was reported as showing no signs of ?osteomyelitis. He developed a small sore on the knuckle. Due to some conflicting information from home health providers and concern from a sister who lives ?out of state, the patient's daughter who has healthcare power of attorney, made an appointment in the wound care center for an evaluation. ?The intake nurse identified a small scab on the dorsal surface of his right great toe. When she removed this, there was a pinpoint opening in the skin without ?any erythema, induration, or purulent drainage. It is perhaps a millimeter at maximum diameter. ?Patient History ?Information obtained from Patient, Caregiver. ?Allergies ?alendronate sodium ?Family History ?Unknown History. ?Social History ?Never smoker, Marital Status - Married, Alcohol Use - Never, Drug Use - No History, Caffeine Use - Rarely. ?Medical History ?Hematologic/Lymphatic ?Patient has history of Anemia ?Cardiovascular ?Patient has history  of Coronary Artery Disease, Myocardial Infarction - 2013 ?Endocrine ?Patient has history of Type II Diabetes ?Musculoskeletal ?Patient has history of Osteoarthritis ?Neurologic ?Patient has history of Neurop

## 2021-08-04 ENCOUNTER — Ambulatory Visit (INDEPENDENT_AMBULATORY_CARE_PROVIDER_SITE_OTHER): Payer: Medicare Other

## 2021-08-04 DIAGNOSIS — M2141 Flat foot [pes planus] (acquired), right foot: Secondary | ICD-10-CM | POA: Diagnosis not present

## 2021-08-04 DIAGNOSIS — M2142 Flat foot [pes planus] (acquired), left foot: Secondary | ICD-10-CM

## 2021-08-04 DIAGNOSIS — E1151 Type 2 diabetes mellitus with diabetic peripheral angiopathy without gangrene: Secondary | ICD-10-CM

## 2021-08-04 NOTE — Progress Notes (Signed)
SITUATION ?Reason for Visit: Fitting of Diabetic [place] ?Patient / Caregiver Report:  Patient is satisfied with fit and function of shoes and insoles. ? ?OBJECTIVE DATA: ?Patient History / Diagnosis:   ?  ICD-10-CM   ?1. Diabetes mellitus with peripheral vascular disease ([place])  E11.51   ?  ?2. Pes planus of both feet  M21.41   ? M21.42   ?  ? ? ?Change in Status:   None ? ?ACTIONS PERFORMED: ?In-Person Delivery, patient was fit with: ?- 1x pair [id number] PDAC approved prefabricated Diabetic Shoes: [employer] 9.5W ?- 3x pair [phone] PDAC approved vacuum formed custom diabetic insoles; [employer]: [id number] ? ?Shoes and insoles were verified for structural integrity and safety. Patient wore shoes and insoles in office. Skin was inspected and free of areas of concern after wearing shoes and inserts. Shoes and inserts fit properly. Patient / Caregiver provided with ferbal instruction and demonstration regarding donning, doffing, wear, care, proper fit, function, purpose, cleaning, and use of shoes and insoles ' and in all related precautions and risks and benefits regarding shoes and insoles. Patient / Caregiver was instructed to wear properly fitting socks with shoes at all times. Patient was also provided with verbal instruction regarding how to report any failures or malfunctions of shoes or inserts, and necessary follow up care. Patient / Caregiver was also instructed to contact physician regarding change in status that may affect function of shoes and inserts.  ? ?Patient / Caregiver verbalized undersatnding of instruction provided. Patient / Caregiver demonstrated independence with proper donning and doffing of shoes and inserts. ? ?PLAN ?Patient to follow with treating physician as recommended. Plan of care was discussed with and agreed upon by patient and/or caregiver. All questions were answered and concerns addressed. ? ?

## 2021-08-12 ENCOUNTER — Encounter (HOSPITAL_BASED_OUTPATIENT_CLINIC_OR_DEPARTMENT_OTHER): Payer: Medicare Other | Admitting: General Surgery

## 2021-08-16 NOTE — Progress Notes (Deleted)
   I, [name], LAT, ATC, am serving as scribe for Dr. [name].  Steven [name] is a 88 y.o. male who presents to [place] at [facility] today for f/u of  L hip pain thought to be due to trochanteric bursitis and hip abductor tendinopathy and R great toe pain due to dragging his foot/toe from drop foot.  He was last seen by Dr. [name] on 07/18/21 and had a L hip/GT steroid injection.  He was also prescribed limited Tramadol for pain to use in combination w/ Tylenol.  He was referred to wound care for his R great toe pain and uric acid labs were obtained to r/o gout (7.2 mg/dL on 07/18/21).  He was previously seen by Dr. [name] on 03/22/21 w/ worsening L hip pain and increasing weakness/ functional capacity.  He was in a WC and was not able to ambulate farther than approximately 10 ft per his home health CNA.  A discussion was had w/ the pt and his sister about getting him admitted to a SNF for inpatient rehab as attempts at home health PT were not successful/helpful.  He was also referred for an L-spine and L hip MRI to be done under sedation but was not completed. Today, pt reports   Dx Imaging: R foot XR- 06/01/21; L hip XR- 06/07/20; L-spine XR- 12/09/18  Pertinent review of systems: ***  Relevant historical information: ***   Exam:  There were no vitals taken for this visit. General: Well Developed, well nourished, and in no acute distress.   MSK: ***    Lab and Radiology Results No results found for this or any previous visit (from the past 72 hour(s)). No results found.     Assessment and Plan: 88 y.o. male with ***   PDMP not reviewed this encounter. No orders of the defined types were placed in this encounter.  No orders of the defined types were placed in this encounter.    Discussed warning signs or symptoms. Please see discharge instructions. Patient expresses understanding.   ***

## 2021-08-17 ENCOUNTER — Ambulatory Visit: Payer: Medicare Other | Admitting: Family Medicine

## 2021-08-22 ENCOUNTER — Encounter: Payer: Self-pay | Admitting: Family Medicine

## 2021-08-22 ENCOUNTER — Ambulatory Visit: Payer: Medicare Other | Admitting: Family Medicine

## 2021-08-22 VITALS — BP 140/80 | HR 71 | Ht 69.0 in | Wt 155.0 lb

## 2021-08-22 DIAGNOSIS — M25552 Pain in left hip: Secondary | ICD-10-CM | POA: Diagnosis not present

## 2021-08-22 NOTE — Progress Notes (Signed)
? ?  I, [name], LAT, ATC, am serving as scribe for Dr. [name]. ? ?Steven [name] is a 88 y.o. male who presents to [place] at [facility] today for f/u L hip and R Great toe pain. Pt was last seen by Dr. [name] on 07/18/21 and was given a L GT steroid injection, prescribed tramadol, referred to wound care, and labs were obtained. Today, pt reports that his L hip pain con't.  His L hip injection helped some but did not last long. ? ?His daughter attends with him today and notes that the goals of his care are shifting more to a keep him at home and keep him comfortable than anything else. ? ?Dx testing: 07/18/21 Labs ? 06/01/21 R foot XR ? 06/07/20 L hip XR ? 12/09/18 L-spine XR ?  ?Pertinent review of systems: No fevers or chills ? ?Relevant historical information: Diabetes.  Frequent falls. ? ? ?Exam:  ?BP 140/80 (BP Location: Left Arm, Patient Position: Sitting, Cuff Size: Normal)   Pulse 71   Ht '5\' 9"'$  (1.753 m)   Wt 155 lb (70.3 kg)   SpO2 98%   BMI 22.89 kg/m?  ?General: Well Developed, well nourished, and in no acute distress.  ?Seated in wheelchair. ?MSK: Left hip tender palpation lateral hip. ? ? ? ? ? ?Assessment and Plan: ?88 y.o. male with lateral hip pain due to trochanteric bursitis.  Patient had an injection a month ago which helped a few weeks.  I think he is a good candidate for outpatient PT especially for iontophoresis.  This seems to be a reasonable approach.  I am hopeful that we can find a way to do this at home with him. ? ?On a more global perspective his goals of care seem to be shifting to more comfort measures (stay at home and not try to get all better).  I do think this makes a difference with his chronic medication management but things like advanced care directives etc. may need to be reconsidered.  I recommended that his daughter schedule an appointment with Dr. Yong Channel (PCP) to have a more thorough goals of care discussion. ? ? ?PDMP not reviewed this  encounter. ?Orders Placed This Encounter  ?Procedures  ? Ambulatory referral to Physical Therapy  ?  Referral Priority:   Routine  ?  Referral Type:   Physical Medicine  ?  Referral Reason:   Specialty Services Required  ?  Requested Specialty:   Physical Therapy  ?  Number of Visits Requested:   1  ? ?No orders of the defined types were placed in this encounter. ? ? ? ?Discussed warning signs or symptoms. Please see discharge instructions. Patient expresses understanding. ? ? ?The above documentation has been reviewed and is accurate and complete [name], M.D. ? ? ?

## 2021-08-22 NOTE — Patient Instructions (Addendum)
Good to see you today. ? ?I've referred you to PT at [facility].  Their office will call you to schedule but please let us know if you don't hear from them in one week regarding scheduling. ? ?Follow-up: as needed ?

## 2021-08-30 ENCOUNTER — Ambulatory Visit (INDEPENDENT_AMBULATORY_CARE_PROVIDER_SITE_OTHER): Payer: Medicare Other | Admitting: Family Medicine

## 2021-08-30 ENCOUNTER — Encounter: Payer: Self-pay | Admitting: Family Medicine

## 2021-08-30 VITALS — BP 130/70 | HR 54 | Temp 98.3°F | Ht 69.0 in | Wt 150.0 lb

## 2021-08-30 DIAGNOSIS — M545 Low back pain, unspecified: Secondary | ICD-10-CM | POA: Diagnosis not present

## 2021-08-30 DIAGNOSIS — M25552 Pain in left hip: Secondary | ICD-10-CM

## 2021-08-30 DIAGNOSIS — I714 Abdominal aortic aneurysm, without rupture, unspecified: Secondary | ICD-10-CM | POA: Diagnosis not present

## 2021-08-30 DIAGNOSIS — F0390 Unspecified dementia without behavioral disturbance: Secondary | ICD-10-CM

## 2021-08-30 MED ORDER — METFORMIN HCL 1000 MG PO TABS: 1000.0000 mg | ORAL_TABLET | Freq: Two times a day (BID) | ORAL | 3 refills | Status: DC

## 2021-08-30 MED ORDER — ATORVASTATIN CALCIUM 80 MG PO TABS: ORAL_TABLET | ORAL | 3 refills | Status: AC

## 2021-08-30 MED ORDER — METOPROLOL SUCCINATE ER 50 MG PO TB24: 50.0000 mg | ORAL_TABLET | Freq: Every day | ORAL | 3 refills | Status: DC

## 2021-08-30 MED ORDER — TRAMADOL HCL 50 MG PO TABS: 50.0000 mg | ORAL_TABLET | Freq: Three times a day (TID) | ORAL | 0 refills | Status: DC | PRN

## 2021-08-30 NOTE — Progress Notes (Signed)
?Phone [phone] ?In person visit ?  ?Subjective:  ? ?Steven [name] is a 88 y.o. year old very pleasant male patient who presents for/with See problem oriented charting ?Chief Complaint  ?Patient presents with  ? knot on back  ?  Pt c/o knot on lower middle back.  ? ? ?Past Medical History-  ?Patient Active Problem List  ? Diagnosis Date Noted  ? Dementia ([place]) 08/30/2021  ?  Priority: High  ? AAA (abdominal aortic aneurysm) ([place]) 12/13/2018  ?  Priority: High  ? Falls frequently 08/21/2017  ?  Priority: High  ? Aortic root dilatation ([place]) 10/06/2016  ?  Priority: High  ? Osteoporosis 01/07/2015  ?  Priority: High  ? CAD, ARTERY BYPASS GRAFT 05/21/2009  ?  Priority: High  ? Carotid artery stenosis 04/20/2008  ?  Priority: High  ? Diabetes mellitus type II, controlled ([place]) 04/19/2007  ?  Priority: High  ? BPH (benign prostatic hyperplasia) 09/18/2019  ?  Priority: Medium   ? Anemia 09/03/2013  ?  Priority: Medium   ? Essential hypertension, benign 10/11/2012  ?  Priority: Medium   ? Hyperlipidemia 04/19/2007  ?  Priority: Medium   ? Seborrheic dermatitis 09/13/2017  ?  Priority: Low  ? Senile purpura ([place]) 08/21/2017  ?  Priority: Low  ? History of acute inferior wall MI 09/03/2015  ?  Priority: Low  ? Foot drop, left 08/23/2012  ?  Priority: Low  ? History of supraventricular tachycardia 01/25/2011  ?  Priority: Low  ? Hypogonadism in male 02/04/2010  ?  Priority: Low  ? History of colonic polyps 09/07/2009  ?  Priority: Low  ? GERD 04/19/2007  ?  Priority: Low  ? Hx of CABG 12/13/2017  ? PVC's (premature ventricular contractions) 12/13/2017  ? ? ?Medications- reviewed and updated ?Current Outpatient Medications  ?Medication Sig Dispense Refill  ? acetaminophen (TYLENOL) 500 MG tablet Take 500 mg by mouth every 6 (six) hours as needed. Up to 6 tabs (3gm) qd    ? aspirin 81 MG tablet Take 81 mg by mouth daily.    ? atorvastatin (LIPITOR) 80 MG tablet TAKE 1 TABLET DAILY. 90 tablet 3  ? metFORMIN (GLUCOPHAGE)  1000 MG tablet Take 1 tablet (1,000 mg total) by mouth 2 (two) times daily with a meal. 180 tablet 3  ? metoprolol succinate (TOPROL-XL) 50 MG 24 hr tablet Take 1 tablet (50 mg total) by mouth daily. Take with or immediately following a meal. 90 tablet 3  ? traMADol (ULTRAM) 50 MG tablet Take 1 tablet (50 mg total) by mouth every 8 (eight) hours as needed. 15 tablet 0  ? ?No current facility-administered medications for this visit.  ? ?  ?Objective:  ?BP 130/70   Pulse (!) 54   Temp 98.3 ?F (36.8 ?C)   Ht '5\' 9"'$  (1.753 m)   Wt 150 lb (68 kg)   SpO2 98%   BMI 22.15 kg/m?  ?Gen: NAD, resting comfortably ?CV: Slightly bradycardic but regular no rubs or gallops ?Lungs: CTAB no crackles, wheeze, rhonchi ?Ext: no edema ?Skin: warm, dry ?MSK: Wheelchair-bound-pointed over left lateral hip over greater trochanteric bursa as source of pain ?  ? ?Assessment and Plan  ? ?#Dementia ?S: MMSE 13 out of 30 on 08/30/2021 ?- Daughter states she has noted decline in patient's memory ?A/P: Dr. [name] had informed me he was worried about patient's memory-MMSE certainly confirms this at 49 out of 6.  We discussed dementia diagnosis ? -  B12 and TSH normal recently ?- Family and patient declined neurology visit or medication pursuit ?-Vascular dementia possible but likely main treatment would still be risk factor modification such as for his CAD unless A-fib were uncovered and with fall risk anticoagulant would not be ideal ?-Unfortunately wife also has dementia but patient has 24/7 care ?-Dr. [name] had mentioned discussing goals of care-as I attempted to discuss these today daughter did not seem quite ready for this conversation-we will continue to touch base of anastomosis 131 issue at a time and is okay taking him out of the home for evaluations ? ?# Left lateral hip pain/left low back pain ?S:has been having issues with this- had steroid injection and was somewhat helpful but short lived for a few days.  ? ?He did have a fall last  night - he is not sure if he fell on the hip or not- caregiver Anderson Malta who is here today was not present- a different caregiver  (who will not be coming out)and daughter was with him.  Pain was more anterior before injection this is more posterior but not all the way to buttocks.  ? ? ?A/P: Left lateral hip pain and left low back pain after fall in a patient with osteoporosis-we will get x-rays to rule out fracture.  I wonder about trochanteric bursitis ?-has tens machine for back and encouraged ice for the hip ?-Sparing tramadol if he is supervised and he is receiving round-the-clock care ? ?# Diabetes ?S:  controlled on metformin 1000 mg twice a day ?-appears to be off glipizide ?CBGs-  rx for freestyle libre in but he isnt using ?Lab Results  ?Component Value Date  ? HGBA1C 6.7 (H) 07/18/2021  ? A/P: Has been well controlled-too soon for repeat-we will schedule follow-up in 3 months ? ?#Atherosclerosis of coronary artery bypass graft of native heart without angina pectoris ?S: Patient follows regularly with Dr. Debara Pickett typically though last visit was late 2021.  He is compliant with aspirin, atorvastatin 80 mg (at least in the past and is not clear he is taking this now), metoprolol.  CABG in 2004.  Today reports no chest pain or shortness of breath ?Lab Results  ?Component Value Date  ? CHOL 130 08/05/2019  ? HDL 41.00 08/05/2019  ? [place] 69 08/05/2019  ? LDLDIRECT 74.0 02/26/2018  ? TRIG 101.0 08/05/2019  ? CHOLHDL 3 08/05/2019  ?  ?A/P: CAD appears stable-certainly could follow-up with cardiology.  We will need to update lipid panel at follow-up-restarted atorvastatin 80 mg in case he was not taking this-suspect lipids may not ideally be controlled but hopefully will improve on follow-up ? ?Aortic root dilatation (H[facility]) ?S: Last imaged October 19, 2016 through Dr. Maudie Flakes aortic root size at that time.  And stable again assumed stable 04/12/2020-not specifically noted as widened on echocardiogram ?A/P:  Patient is due for follow-up with cardiology-we will plan to encourage him to schedule this at follow-up to discuss possible repeat echocardiogram ? ?Abdominal aortic aneurysm (AAA) without rupture ([place]) ?S: August 2020 showed distal aortic aneurysm 4.4 cm.  Stable May 2021.  Yearly follow-up needed.  Found incidentally during other work-up ?A/P: Suspect stable-we will update AAA duplex ? ? ?Essential hypertension, benign ?S: Compliant with metoprolol 50 mg extended release ?-  diltiazem 180 mg extended release appears to be off by fill dates, lisinopril 2.5 mg also appears to be off ?BP Readings from Last 3 Encounters:  ?08/30/21 130/70  ?08/22/21 140/80  ?07/18/21 122/86  ?A/P: Well-controlled  on metoprolol alone-continue current medication ? ?#Osteoporosis ?S: did not tolerate fosamax- GI effects. , calcium, vitamin D ?A/P: Still in light of osteoporosis think we should update x-rays with fall ? ?Recommended follow up: Return in about 3 months (around 11/30/2021). ?Future Appointments  ?Date Time Provider [place]  ?08/31/2021  1:30 PM Marzetta Board, DPM [facility]  ?11/29/2021  3:20 PM Netha Dafoe, Brayton Mars, MD [facility]  ? ? ?Lab/Order associations: ?  ICD-10-CM   ?1. Acute left-sided low back pain without sciatica  M54.50 DG Lumbar Spine Complete  ?  ?2. Lateral pain of left hip  M25.552 DG Hip Unilat W OR W/O Pelvis 2-3 Views Left  ?  ?3. Abdominal aortic aneurysm (AAA) without rupture, unspecified part ([place])  I71.40 VAS [place] AAA DUPLEX  ?  ?4. Dementia without behavioral disturbance, psychotic disturbance, mood disturbance, or anxiety, unspecified dementia severity, unspecified dementia type ([place])  F03.90   ?  ? ? ?Meds ordered this encounter  ?Medications  ? atorvastatin (LIPITOR) 80 MG tablet  ?  Sig: TAKE 1 TABLET DAILY.  ?  Dispense:  90 tablet  ?  Refill:  3  ? metFORMIN (GLUCOPHAGE) 1000 MG tablet  ?  Sig: Take 1 tablet (1,000 mg total) by mouth 2 (two) times daily with a meal.  ?   Dispense:  180 tablet  ?  Refill:  3  ? metoprolol succinate (TOPROL-XL) 50 MG 24 hr tablet  ?  Sig: Take 1 tablet (50 mg total) by mouth daily. Take with or immediately following a meal.  ?  Dispense:  90 tablet

## 2021-08-30 NOTE — Patient Instructions (Addendum)
Schedule follow up for diabetic eye exam and have them send [place] a copy ? ?Please go to [facility]  central X-ray (updated 06/12/2019) ?- located [address] across the street from [place] ?- in the basement ?- Hours: 8:30-5:00 PM M-F (with lunch from 12:30- 1 PM). You do NOT need an appointment.  ?- Please ensure you are covid symptom free before going in(No fever, chills, cough, congestion, runny nose, shortness of breath, fatigue, body aches, sore throat, headache, nausea, vomiting, diarrhea, or new loss of taste or smell. No known contacts with covid 19 or someone being tested for covid 19) ? ?We will call you within two weeks about your referral to abdominal aortic aneurysm follow up. If you do not hear within 2 weeks, give [place] a call.   ? ?Recommended follow up: Return in about 3 months (around 11/30/2021). ?-cancel June visit on way out ?

## 2021-08-31 ENCOUNTER — Ambulatory Visit (INDEPENDENT_AMBULATORY_CARE_PROVIDER_SITE_OTHER): Payer: Self-pay | Admitting: Podiatry

## 2021-08-31 DIAGNOSIS — Z91199 Patient's noncompliance with other medical treatment and regimen due to unspecified reason: Secondary | ICD-10-CM

## 2021-08-31 NOTE — Progress Notes (Signed)
   Complete physical exam  Patient: Steven [name]   DOB: 02/04/1997   88 y.o. Male  MRN: [id number]  Subjective:    No chief complaint on file.   Steven [name] is a 88 y.o. male who presents today for a complete physical exam. She reports consuming a {diet types:17450} diet. {types:19826} She generally feels {DESC; WELL/FAIRLY WELL/POORLY:18703}. She reports sleeping {DESC; WELL/FAIRLY WELL/POORLY:18703}. She {does/does not:200015} have additional problems to discuss today.    Most recent fall risk assessment:    10/12/2021   10:42 AM  Fall Risk   Falls in the past year? 0  Number falls in past yr: 0  Injury with Fall? 0  Risk for fall due to : No Fall Risks  Follow up Falls evaluation completed     Most recent depression screenings:    10/12/2021   10:42 AM 09/02/2020   10:46 AM  PHQ 2/9 Scores  PHQ - 2 Score 0 0  PHQ- 9 Score 5     {VISON DENTAL STD PSA (Optional):27386}  {History (Optional):23778}  Patient Care Team: Jessup, Joy, NP as PCP - General (Nurse Practitioner)   Outpatient Medications Prior to Visit  Medication Sig   fluticasone (FLONASE) 50 MCG/ACT nasal spray Place 2 sprays into both nostrils in the morning and at bedtime. After 7 days, reduce to once daily.   norgestimate-ethinyl estradiol (SPRINTEC 28) 0.25-35 MG-MCG tablet Take 1 tablet by mouth daily.   Nystatin POWD Apply liberally to affected area 2 times per day   spironolactone (ALDACTONE) 100 MG tablet Take 1 tablet (100 mg total) by mouth daily.   No facility-administered medications prior to visit.    ROS        Objective:     There were no vitals taken for this visit. {Vitals History (Optional):23777}  Physical Exam   No results found for any visits on 11/17/21. {Show previous labs (optional):23779}    Assessment & Plan:    Routine Health Maintenance and Physical Exam  Immunization History  Administered Date(s) Administered   DTaP 04/20/1999, 06/16/1999,  08/25/1999, 05/10/2000, 11/24/2003   Hepatitis A 09/20/2007, 09/25/2008   Hepatitis B 02/05/1999, 03/15/1999, 08/25/1999   HiB (PRP-OMP) 04/20/1999, 06/16/1999, 08/25/1999, 05/10/2000   IPV 04/20/1999, 06/16/1999, 02/13/2000, 11/24/2003   Influenza,inj,Quad PF,6+ Mos 12/26/2013   Influenza-Unspecified 03/27/2012   MMR 02/12/2001, 11/24/2003   Meningococcal Polysaccharide 09/25/2011   Pneumococcal Conjugate-13 05/10/2000   Pneumococcal-Unspecified 08/25/1999, 11/08/1999   Tdap 09/25/2011   Varicella 02/13/2000, 09/20/2007    Health Maintenance  Topic Date Due   HIV Screening  Never done   Hepatitis C Screening  Never done   INFLUENZA VACCINE  11/15/2021   PAP-Cervical Cytology Screening  11/17/2021 (Originally 02/04/2020)   PAP SMEAR-Modifier  11/17/2021 (Originally 02/04/2020)   TETANUS/TDAP  11/17/2021 (Originally 09/24/2021)   HPV VACCINES  Discontinued   COVID-19 Vaccine  Discontinued    Discussed health benefits of physical activity, and encouraged her to engage in regular exercise appropriate for her age and condition.  Problem List Items Addressed This Visit   None Visit Diagnoses     Annual physical exam    -  Primary   Cervical cancer screening       Need for Tdap vaccination          No follow-ups on file.     Joy Jessup, NP   

## 2021-09-07 NOTE — Therapy (Addendum)
OUTPATIENT PHYSICAL THERAPY LOWER EXTREMITY EVALUATION  PHYSICAL THERAPY DISCHARGE SUMMARY  Visits from Start of Care: 1  Current functional level related to goals / functional outcomes: Unable to assess due to unplanned discharge    Remaining deficits: Unable to assess due to unplanned discharge    Education / Equipment: Unable to assess due to unplanned discharge    Patient agrees to discharge. Patient goals were not met. Patient is being discharged due to not returning since the last visit.  3:45 PM, 05/11/22 Jerene Pitch, DPT Physical Therapy with [facility]   Patient Name: Steven [name] MRN: [id number] DOB:1930/09/27, 88 y.o., male Today's Date: 09/08/2021   PT End of Session - 09/08/21 1437     Visit Number 1    Number of Visits 2    Date for PT Re-Evaluation 09/22/21    Authorization Type [employer]    PT Start Time 8413   late to apt   PT Stop Time 2440    PT Time Calculation (min) 38 min    Activity Tolerance Patient tolerated treatment well             Past Medical History:  Diagnosis Date   Abnormality of gait 08/23/2012   Anemia    hx of   Arthritis 01-03-12   osteoarthritis-knees   CAD (coronary artery disease) 2004   s/p inferior wall Mi 2004 with PTCA/Stent; s/p CABG 2004   Degenerative arthritis    Diabetes mellitus    type II   Foot drop, bilateral 08/23/2012   GERD (gastroesophageal reflux disease)    Heart disease    History of kidney stones    History of myocardial infarction    Hyperlipidemia    Myocardial infarction [facility]) 01-03-12   '04   MYOCARDIAL INFARCTION, HX OF 04/19/2007   Qualifier: Diagnosis of  By: Scherrie Gerlach     Neuropathy 01-03-12   bil. feet   Pneumonia    hx of   Polyneuropathy in diabetes(357.2) 08/23/2012   Radiculopathy of lumbar region 08/23/2011   Followed by Dr. Vertell Limber. S/p surgery. Resolved after surgery.     Past Surgical History:  Procedure Laterality Date   BACK SURGERY  2013   CARDIAC CATHETERIZATION   01-03-12   '04   CATARACT EXTRACTION Bilateral 2015   COLONOSCOPY     CORONARY ARTERY BYPASS GRAFT  2004   LIMA to LAD; SVG to ramus intermedius; SVG to PDA/PLSA   LAMINECTOMY  09/08/2011   Procedure: LUMBAR LAMINECTOMY FOR TUMOR;  Surgeon: Erline Levine, MD;  Location: [place] [facility];  Service: Neurosurgery;  Laterality: Left;  Left Thoracic twelve-Lumbar one transpedicular resection of epidural mass   TONSILLECTOMY  as child   TOTAL KNEE ARTHROPLASTY  01/15/2012   Procedure: TOTAL KNEE ARTHROPLASTY;  Surgeon: Gearlean Alf, MD;  Location: [facility];  Service: Orthopedics;  Laterality: Right;   TOTAL KNEE ARTHROPLASTY Left 09/16/2012   Procedure: LEFT TOTAL KNEE ARTHROPLASTY;  Surgeon: Gearlean Alf, MD;  Location: [facility];  Service: Orthopedics;  Laterality: Left;   Patient Active Problem List   Diagnosis Date Noted   Dementia ([place]) 08/30/2021   BPH (benign prostatic hyperplasia) 09/18/2019   AAA (abdominal aortic aneurysm) ([place]) 12/13/2018   Hx of CABG 12/13/2017   PVC's (premature ventricular contractions) 12/13/2017   Seborrheic dermatitis 09/13/2017   Falls frequently 08/21/2017   Senile purpura ([place]) 08/21/2017   Aortic root dilatation ([place]) 10/06/2016   History of acute inferior wall MI 09/03/2015   Osteoporosis  01/07/2015   Anemia 09/03/2013   Essential hypertension, benign 10/11/2012   Foot drop, left 08/23/2012   History of supraventricular tachycardia 01/25/2011   Hypogonadism in male 02/04/2010   History of colonic polyps 09/07/2009   CAD, ARTERY BYPASS GRAFT 05/21/2009   Carotid artery stenosis 04/20/2008   Diabetes mellitus type II, controlled ([place]) 04/19/2007   Hyperlipidemia 04/19/2007   GERD 04/19/2007    PCP: [name], MD  REFERRING PROVIDER: [name]  REFERRING DIAG: [phone] (ICD-10-CM) - Left hip pain  THERAPY DIAG:  Lateral pain of left hip - Plan: PT plan of care cert/re-cert  Difficulty in walking, not elsewhere classified - Plan: PT plan of  care cert/re-cert  Muscle weakness (generalized) - Plan: PT plan of care cert/re-cert  Rationale for Evaluation and Treatment Rehabilitation  ONSET DATE: unsure  SUBJECTIVE:   SUBJECTIVE STATEMENT: Anderson Malta is present and is patient's day caregiver. Patient states that he has been in a lot of hip pain and it hurts to stand, move and walk.  States they have been using TENs, biofreeze, on pain pills (tramadol) and that has not helping. States that he fell last week and still has to get the images done. Patient with around the clock care and really looking for pain management strategies.  PERTINENT HISTORY: diabetic, drop foot, wound on bottom of foot, osteoporosis, gout, neuropathy, CAD, memory issues  PAIN:  Are you having pain? Yes: NPRS scale: medium bad at rest and unbearable bad /10 Pain location: left hip pain Pain description: dull, constant Aggravating factors: standing, weightbearing Relieving factors: tramadol,  PRECAUTIONS: Fall  WEIGHT BEARING RESTRICTIONS unsure - awaiting imaging from recent fall  FALLS:  Has patient fallen in last 6 months? Yes. Number of falls 1  LIVING ENVIRONMENT: Lives with: lives with their family, lives with their daughter, and and full time around the clock care Lives in: House/apartment Stairs: Yes: External: 2 steps; on right going up Has following equipment at home: Walker - 2 wheeled, WC, BSC, shower chair Has 24/7 care with night and day care givers  OCCUPATION: retired  PLOF: Needs assistance with ADLs, Needs assistance with gait, and Needs assistance with transfers  PATIENT GOALS to have less pain    OBJECTIVE:   DIAGNOSTIC FINDINGS: awaiting imaging to rule out fracture from recent fall   COGNITION:  Overall cognitive status: History of cognitive impairments - at baseline       POSTURE: slumped sitting in chair, increased thoracic kyphosis, sacral sitting, forward head, hips abd/ER  PALPATION: Tenderness to  palpation    LE Measurements - tested  in sitting Lower Extremity Right 09/08/2021 Left 09/08/2021   A/PROM MMT A/PROM MMT  Hip Flexion 105 3+* 105 3+  Hip Extension      Hip Abduction      Hip Adduction  3+*  3+  Hip Internal rotation      Hip External rotation      Knee Flexion  3+*  3+  Knee Extension  4-  4-  Ankle Dorsiflexion      Ankle Plantarflexion      Ankle Inversion      Ankle Eversion       (Blank rows = not tested)  * pain    GAIT: - not tested - has not been ambulating and has not had imaging to rule out fracture from recent fall Transfers: Mod/max assist to perform STS from w/c and max assist to maintain standing position.  TODAY'S TREATMENT: 09/08/2021 Therapeutic Exercise:  Aerobic: Supine: Prone:  Seated: glute squeezes x12, 5" holds, hip add iso x12 5" holds, hip abd iso with band x12 5" holds  Standing: Neuromuscular Re-education: Manual Therapy: Therapeutic Activity: Self Care: Trigger Point Dry Needling:  Modalities:  Ionto, dexamethasone , 4 hr patch to L gr troch., instructions on use, precautions and wear time.      PATIENT EDUCATION:  Education details: on current presentation, on HEP, on how to use ionto, next steps and plan moving forward Person educated: Patient Education method: Explanation, Demonstration, and Handouts Education comprehension: verbalized understanding   HOME EXERCISE PROGRAM: [id number]  ASSESSMENT:  CLINICAL IMPRESSION: Patient is a 88 y.o. male who was seen today for physical therapy evaluation and treatment for left hip pain.. Patient with caregiver who helped with history. Patient with weakness and functional deficits but with goals of managing pain and making quality of life better. Focused on education today and answering all questions. Demonstrated how to apply ionto patch to left hip and when/how to remove patch. Supplied patient with basic HEP to help promote blood floor to hip muscles. Discussed contacting  MD for prescription of dexamethazone if relief noted from patch to help with home administration for pain relief. Patient would benefit from PT for additional session for education if needed on pain management strategies and HEP development.   OBJECTIVE IMPAIRMENTS decreased activity tolerance, decreased endurance, decreased knowledge of use of DME, difficulty walking, decreased ROM, decreased strength, and pain.   ACTIVITY LIMITATIONS sitting, standing, sleeping, transfers, bed mobility, bathing, toileting, and dressing  PARTICIPATION LIMITATIONS: cleaning  PERSONAL FACTORS Age, Fitness, and 3+ comorbidities: diabetic, foot drop, osteoporosis, hx of falls, gout, ulcer of toe, dementia  are also affecting patient's functional outcome.   REHAB POTENTIAL: Poor currently family seeking comfort care and not trying to fix current orthopedic issues  CLINICAL DECISION MAKING: Unstable/unpredictable  EVALUATION COMPLEXITY: High   GOALS: Goals reviewed with patient? Yes  SHORT TERM GOALS: Target date: 09/22/2021   Patient's caregivers will report confidence in using and obtaining ionto for pain management of left hip Baseline: Goal status: INITIAL   PLAN: PT FREQUENCY: 1x/week  PT DURATION: 2 week  PLANNED INTERVENTIONS: Therapeutic exercises, Therapeutic activity, Neuromuscular re-education, Balance training, Gait training, Patient/Family education, Joint mobilization, Electrical stimulation, Cryotherapy, Moist heat, and Ionotophoresis '4mg'$ /ml Dexamethasone  PLAN FOR NEXT SESSION: 1x/week for 2 weeks  4:21 PM, 09/08/21 Jerene Pitch, DPT Physical Therapy with [facility]

## 2021-09-08 ENCOUNTER — Encounter: Payer: Self-pay | Admitting: Physical Therapy

## 2021-09-08 ENCOUNTER — Ambulatory Visit (INDEPENDENT_AMBULATORY_CARE_PROVIDER_SITE_OTHER): Payer: Medicare Other | Admitting: Physical Therapy

## 2021-09-08 ENCOUNTER — Inpatient Hospital Stay (HOSPITAL_COMMUNITY): Admission: RE | Admit: 2021-09-08 | Payer: Medicare Other | Source: Ambulatory Visit

## 2021-09-08 DIAGNOSIS — M25552 Pain in left hip: Secondary | ICD-10-CM | POA: Diagnosis not present

## 2021-09-08 DIAGNOSIS — M6281 Muscle weakness (generalized): Secondary | ICD-10-CM

## 2021-09-08 DIAGNOSIS — R262 Difficulty in walking, not elsewhere classified: Secondary | ICD-10-CM

## 2021-09-16 ENCOUNTER — Telehealth: Payer: Self-pay | Admitting: Family Medicine

## 2021-09-16 ENCOUNTER — Ambulatory Visit (HOSPITAL_COMMUNITY)
Admission: RE | Admit: 2021-09-16 | Discharge: 2021-09-16 | Disposition: A | Discharge status: Home / Self Care | Payer: Medicare Other | Source: Ambulatory Visit | Attending: Family Medicine | Admitting: Family Medicine

## 2021-09-16 DIAGNOSIS — I714 Abdominal aortic aneurysm, without rupture, unspecified: Secondary | ICD-10-CM | POA: Insufficient documentation

## 2021-09-16 NOTE — Telephone Encounter (Signed)
Patient's daughter called back.  I have given her Dr. Ronney Lion response to [place].   Daughter understood.

## 2021-09-21 ENCOUNTER — Other Ambulatory Visit: Payer: Self-pay | Admitting: Family Medicine

## 2021-09-21 NOTE — Telephone Encounter (Signed)
Patient is requesting a refill for tramadol - [facility] [address] -

## 2021-09-22 ENCOUNTER — Ambulatory Visit: Payer: Medicare Other | Admitting: Family Medicine

## 2021-09-22 MED ORDER — TRAMADOL HCL 50 MG PO TABS: 50.0000 mg | ORAL_TABLET | Freq: Three times a day (TID) | ORAL | 0 refills | Status: DC | PRN

## 2021-09-22 NOTE — Telephone Encounter (Signed)
Request sent to Dr. Hunter for approval. 

## 2021-10-05 ENCOUNTER — Ambulatory Visit (INDEPENDENT_AMBULATORY_CARE_PROVIDER_SITE_OTHER): Payer: Medicare Other | Admitting: Family Medicine

## 2021-10-05 ENCOUNTER — Encounter: Payer: Self-pay | Admitting: Family Medicine

## 2021-10-05 VITALS — BP 140/80 | HR 98 | Temp 97.7°F | Ht 69.0 in

## 2021-10-05 DIAGNOSIS — H04123 Dry eye syndrome of bilateral lacrimal glands: Secondary | ICD-10-CM | POA: Diagnosis not present

## 2021-10-05 NOTE — Progress Notes (Signed)
Subjective:     Patient ID: BROADUS [name], male    DOB: 1930/05/05, 88 y.o.   MRN: [id number]  Chief Complaint  Patient presents with   Eye Problem    Right eye swollen and red since Sunday, vision blurry at times, dry eyes Used eye drops, looks better today    HPI-here w/caregiver.  Daughter in lobby w/pt wife. R eyelid/eye was red and puffy since 6/18.  Using OTC eye drops.  Getting better/gone this am.  Irrit.  Vision is blurry long time.  Eyes are dry.    Health Maintenance Due  Topic Date Due   OPHTHALMOLOGY EXAM  12/04/2019   DEXA SCAN  04/05/2020   URINE MICROALBUMIN  11/17/2020    Past Medical History:  Diagnosis Date   Abnormality of gait 08/23/2012   Anemia    hx of   Arthritis 01-03-12   osteoarthritis-knees   CAD (coronary artery disease) 2004   s/p inferior wall Mi 2004 with PTCA/Stent; s/p CABG 2004   Degenerative arthritis    Diabetes mellitus    type II   Foot drop, bilateral 08/23/2012   GERD (gastroesophageal reflux disease)    Heart disease    History of kidney stones    History of myocardial infarction    Hyperlipidemia    Myocardial infarction [facility]) 01-03-12   '04   MYOCARDIAL INFARCTION, HX OF 04/19/2007   Qualifier: Diagnosis of  By: Scherrie Gerlach     Neuropathy 01-03-12   bil. feet   Pneumonia    hx of   Polyneuropathy in diabetes(357.2) 08/23/2012   Radiculopathy of lumbar region 08/23/2011   Followed by Dr. Vertell Limber. S/p surgery. Resolved after surgery.      Past Surgical History:  Procedure Laterality Date   BACK SURGERY  2013   CARDIAC CATHETERIZATION  01-03-12   '04   CATARACT EXTRACTION Bilateral 2015   COLONOSCOPY     CORONARY ARTERY BYPASS GRAFT  2004   LIMA to LAD; SVG to ramus intermedius; SVG to PDA/PLSA   LAMINECTOMY  09/08/2011   Procedure: LUMBAR LAMINECTOMY FOR TUMOR;  Surgeon: Erline Levine, MD;  Location: [place] [facility];  Service: Neurosurgery;  Laterality: Left;  Left Thoracic twelve-Lumbar one transpedicular resection of  epidural mass   TONSILLECTOMY  as child   TOTAL KNEE ARTHROPLASTY  01/15/2012   Procedure: TOTAL KNEE ARTHROPLASTY;  Surgeon: Gearlean Alf, MD;  Location: [facility];  Service: Orthopedics;  Laterality: Right;   TOTAL KNEE ARTHROPLASTY Left 09/16/2012   Procedure: LEFT TOTAL KNEE ARTHROPLASTY;  Surgeon: Gearlean Alf, MD;  Location: [facility];  Service: Orthopedics;  Laterality: Left;    Outpatient Medications Prior to Visit  Medication Sig Dispense Refill   acetaminophen (TYLENOL) 500 MG tablet Take 500 mg by mouth every 6 (six) hours as needed. Up to 6 tabs (3gm) qd     aspirin 81 MG tablet Take 81 mg by mouth daily.     atorvastatin (LIPITOR) 80 MG tablet TAKE 1 TABLET DAILY. 90 tablet 3   metFORMIN (GLUCOPHAGE) 1000 MG tablet Take 1 tablet (1,000 mg total) by mouth 2 (two) times daily with a meal. 180 tablet 3   metoprolol succinate (TOPROL-XL) 50 MG 24 hr tablet Take 1 tablet (50 mg total) by mouth daily. Take with or immediately following a meal. 90 tablet 3   traMADol (ULTRAM) 50 MG tablet Take 1 tablet (50 mg total) by mouth every 8 (eight) hours as needed. 15 tablet 0  No facility-administered medications prior to visit.    Allergies  Allergen Reactions   Alendronate     reflux   ROS neg/noncontributory except as noted HPI/below      Objective:     BP 140/80   Pulse 98   Temp 97.7 F (36.5 C) (Temporal)   Ht '5\' 9"'$  (1.753 m)   SpO2 97%   BMI 22.15 kg/m  Wt Readings from Last 3 Encounters:  08/30/21 150 lb (68 kg)  08/22/21 155 lb (70.3 kg)  11/21/19 155 lb (70.3 kg)    Physical Exam   Gen: WDWN NAD elderly wm in w/c HEENT: NCAT, conjunctiva not injected, sclera nonicteric. Eyelids-no crust, redness, etc.   NECK:  supple, no thyromegaly, no nodes, no carotid bruits CARDIAC: RRR w occ ectopic, S1S2+, 2/6 murmur. LUNGS: CTAB. No wheezes EXT:  no edema MSK: w/c NEURO: Alert  CN II-XII intact.  PSYCH: normal mood. Good eye contact     Assessment & Plan:    Problem List Items Addressed This Visit   None Visit Diagnoses     Dry eye syndrome of both eyes    -  Primary      Dry eyes-no redness/swelling now.  Poss dry eyes and better w/rewetting drops.  Continue indefinately.  Monitor.  Warm soaks if needed.   No orders of the defined types were placed in this encounter.   Wellington Hampshire, MD

## 2021-10-05 NOTE — Patient Instructions (Addendum)
Dry eyes-rewetting drops,  systane eye drops  Keep eyelids clean-warm water wash it.      Let me know if something show up

## 2021-11-22 ENCOUNTER — Ambulatory Visit: Payer: Medicare Other | Admitting: Family Medicine

## 2021-11-22 ENCOUNTER — Ambulatory Visit: Payer: Self-pay

## 2021-11-22 VITALS — BP 142/78 | HR 69 | Ht 69.0 in

## 2021-11-22 DIAGNOSIS — M5416 Radiculopathy, lumbar region: Secondary | ICD-10-CM

## 2021-11-22 DIAGNOSIS — M21372 Foot drop, left foot: Secondary | ICD-10-CM

## 2021-11-22 DIAGNOSIS — M25552 Pain in left hip: Secondary | ICD-10-CM | POA: Diagnosis not present

## 2021-11-22 MED ORDER — GABAPENTIN 100 MG PO CAPS: 100.0000 mg | ORAL_CAPSULE | Freq: Every day | ORAL | 3 refills | Status: AC

## 2021-11-22 NOTE — Patient Instructions (Addendum)
Thank you for coming in today.   Gabapentin at night.   Injection today.   If not better we are going to want to do an MRI to look at whats wrong.   Will update you about the patch   3 month follow up

## 2021-11-22 NOTE — Progress Notes (Signed)
   I, [name], LAT, ATC acting as a scribe for [name], MD.  Steven [name] is a 88 y.o. male who presents to [place] [facility] today for f/u L hip pain due to trochanteric bursitis. Pt was last seen by Dr. [name] on 08/22/21 and was advised to schedule a visit w/ PCP to discuss pt's goal of care and advanced care directives. Pt was referred to [place] for PT, but have not scheduled any visits. Today, pt reports he was up all night last night w/ L hip pain. Pt locates pain to the lateral aspect of the L hip. Pt describes pain as constant. Pt's daughter expresses confusion about the PT referral and obtaining the ionto patches.  Ideally the family would like iontophoresis patches prescribed to use outpatient.  Pain is located in the lateral hip but does not radiate down the leg to the lateral calf.  Dx testing: 07/18/21 Labs 06/07/20 L hip XR             12/09/18 L-spine XR  Pertinent review of systems: No fevers or chills  Relevant historical information: Diabetes.  Dementia.   Exam:  BP (!) 142/78   Pulse 69   Ht '5\' 9"'$  (1.753 m)   SpO2 97%   BMI 22.15 kg/m  General: Thin elderly man seated in wheelchair in no acute distress.  MSK: Left hip: Tender palpation greater trochanter.  Decreased leg strength.  Requires assistance to stand.  Neuropsych: Alert.  Not oriented.  Conversation is perseverating   Lab and Radiology Results  Hip greater trochanteric injection: left Consent obtained and timeout performed. Area of maximum tenderness palpated and identified. Skin cleaned with alcohol, cold spray applied. A 22g needle was used to access the greater trochanteric bursa. '40mg'$  of Kenalog and 2 mL of Marcaine were used to inject the trochanteric bursa. Patient tolerated the procedure well.     Assessment and Plan: 88 y.o. male with left lateral hip pain thought to be due to trochanteric bursitis.  Some of his pain or may be a lot of it is due  to lumbar radiculopathy.  Plan for greater trochanter injection.  We will try to arrange for iontophoresis patches to use at home to minimize how frequently he needs to go to physical therapy but this is going to be challenging.  Additionally some of his pain is thought to be due to lumbar radiculopathy.  This will need to be evaluated with an MRI which is going to be challenging.  If not better we can try to get an MRI lumbar spine.  We can prescribe gabapentin at low-dose as well for use at bedtime.   PDMP not reviewed this encounter. No orders of the defined types were placed in this encounter.  Meds ordered this encounter  Medications   gabapentin (NEURONTIN) 100 MG capsule    Sig: Take 1 capsule (100 mg total) by mouth at bedtime.    Dispense:  30 capsule    Refill:  3     Discussed warning signs or symptoms. Please see discharge instructions. Patient expresses understanding.   The above documentation has been reviewed and is accurate and complete [name], M.D.

## 2021-11-25 ENCOUNTER — Ambulatory Visit: Payer: Self-pay

## 2021-11-25 NOTE — Patient Outreach (Signed)
  Care Coordination   11/25/2021 Name: Steven [name] MRN: [id number] DOB: Feb 05, 1931   Care Coordination Outreach Attempts:  An unsuccessful telephone outreach was attempted today to offer the patient information about available care coordination services as a benefit of their health plan.   Follow Up Plan:  Additional outreach attempts will be made to offer the patient care coordination information and services.   Encounter Outcome:  No Answer  Care Coordination Interventions Activated:  No   Care Coordination Interventions:  No, not indicated    Daneen Schick, BSW, CDP Social Worker, Certified Dementia Practitioner Care Coordination [phone]

## 2021-11-29 ENCOUNTER — Ambulatory Visit (INDEPENDENT_AMBULATORY_CARE_PROVIDER_SITE_OTHER): Payer: Medicare Other | Admitting: Family Medicine

## 2021-11-29 ENCOUNTER — Encounter: Payer: Self-pay | Admitting: Family Medicine

## 2021-11-29 VITALS — BP 118/74 | HR 58 | Temp 97.7°F | Ht 69.0 in | Wt 150.0 lb

## 2021-11-29 DIAGNOSIS — E782 Mixed hyperlipidemia: Secondary | ICD-10-CM

## 2021-11-29 DIAGNOSIS — I10 Essential (primary) hypertension: Secondary | ICD-10-CM

## 2021-11-29 DIAGNOSIS — Z741 Need for assistance with personal care: Secondary | ICD-10-CM | POA: Diagnosis not present

## 2021-11-29 DIAGNOSIS — R531 Weakness: Secondary | ICD-10-CM

## 2021-11-29 DIAGNOSIS — E119 Type 2 diabetes mellitus without complications: Secondary | ICD-10-CM

## 2021-11-29 NOTE — Progress Notes (Signed)
Phone [phone] In person visit   Subjective:   Steven [name] is a 88 y.o. year old very pleasant male patient who presents for/with See problem oriented charting Chief Complaint  Patient presents with   Follow-up   Diabetes   mobility    Pt c/o mobility issues seeming more weaker today than normal.   Hip Pain    Pt c/o still having hip pain.    Past Medical History-  Patient Active Problem List   Diagnosis Date Noted   Dementia ([place]) 08/30/2021    Priority: High   AAA (abdominal aortic aneurysm) ([place]) 12/13/2018    Priority: High   Falls frequently 08/21/2017    Priority: High   Aortic root dilatation ([place]) 10/06/2016    Priority: High   Osteoporosis 01/07/2015    Priority: High   CAD, ARTERY BYPASS GRAFT 05/21/2009    Priority: High   Carotid artery stenosis 04/20/2008    Priority: High   Diabetes mellitus type II, controlled ([place]) 04/19/2007    Priority: High   BPH (benign prostatic hyperplasia) 09/18/2019    Priority: Medium    Anemia 09/03/2013    Priority: Medium    Essential hypertension, benign 10/11/2012    Priority: Medium    Hyperlipidemia 04/19/2007    Priority: Medium    Seborrheic dermatitis 09/13/2017    Priority: Low   Senile purpura ([place]) 08/21/2017    Priority: Low   History of acute inferior wall MI 09/03/2015    Priority: Low   Foot drop, left 08/23/2012    Priority: Low   History of supraventricular tachycardia 01/25/2011    Priority: Low   Hypogonadism in male 02/04/2010    Priority: Low   History of colonic polyps 09/07/2009    Priority: Low   GERD 04/19/2007    Priority: Low   Hx of CABG 12/13/2017   PVC's (premature ventricular contractions) 12/13/2017    Medications- reviewed and updated Current Outpatient Medications  Medication Sig Dispense Refill   acetaminophen (TYLENOL) 500 MG tablet Take 500 mg by mouth every 6 (six) hours as needed. Up to 6 tabs (3gm) qd     aspirin 81 MG tablet Take 81 mg by mouth daily.      atorvastatin (LIPITOR) 80 MG tablet TAKE 1 TABLET DAILY. 90 tablet 3   gabapentin (NEURONTIN) 100 MG capsule Take 1 capsule (100 mg total) by mouth at bedtime. 30 capsule 3   metFORMIN (GLUCOPHAGE) 1000 MG tablet Take 1 tablet (1,000 mg total) by mouth 2 (two) times daily with a meal. 180 tablet 3   metoprolol succinate (TOPROL-XL) 50 MG 24 hr tablet Take 1 tablet (50 mg total) by mouth daily. Take with or immediately following a meal. 90 tablet 3   No current facility-administered medications for this visit.     Objective:  BP 118/74   Pulse (!) 58   Temp 97.7 F (36.5 C)   Ht '5\' 9"'$  (1.753 m)   Wt 150 lb (68 kg)   SpO2 99%   BMI 22.15 kg/m  Gen: NAD, resting comfortably CV: RRR no murmurs rubs or gallops Lungs: CTAB no crackles, wheeze, rhonchi Abdomen: soft/nontender/nondistended/normal bowel sounds.  Ext: trace edema Skin: warm, dry     Assessment and Plan   #Mobility concerns S: Patient reports issues with weakness.  Continues to have hip pain but somewhat better after injection and gabapentin.  Last visit  dealing with left-sided low back pain without sciatica-we ordered low back films which he has  not had a chance to complete yet (back somewhat better)-there has been a concern he may have fallen on the hip and wanted to rule out fracture.  We also ordered hip films which he ended up instead having ordered through Dr. [name].  He was going to try TENS machine at home and has sparing tramadol available if needed  Injection helped some at first then seemed to fade off- gabapentin also somewhat helpful per caregivers- at least  5 of 6 of them  A/P: ongoing issues- transport is getting harder- going to look at medical transport to help with getting out of house/visits. We will also try to help with home health PT.  Recently needing help getting to bathroom by caregivers- daughter is having a harder time getting him there- shed like to have him be more confident to increase her  confidence in getting to the bathrom  #Dementia S: MMSE 13 out of 30 on 08/30/2021- B12 and TSH normal- Family and patient declined neurology visit or medication pursuit - memory overall stable from last visit A/P: with dementia- still wants to hold off on neurology or medication. Focus on quality of life.    # Diabetes S:  controlled on, metformin 1000 mg twice a day -Off Amaryl due to risk of lows-prior on 1 mg CBGs- not checking sugars. Has freestyle libre Lab Results  Component Value Date   HGBA1C 6.7 (H) 07/18/2021   A/P: hopefully stable- update a1c today. Continue current meds for now   #Stenosis of right carotid artery #Atherosclerosis of coronary artery bypass graft of native heart without angina pectoris #hyperlipidemia S: Patient follows regularly with Dr. Debara Pickett- has visit with PA.  He is compliant with aspirin, statin, metoprolol- He is on atorvastatin 80 mg, aspirin 81 mg daily.  CABG in 2004.  Today reports, no chest pain or shortness of breath.  Lab Results  Component Value Date   CHOL 130 08/05/2019   HDL 41.00 08/05/2019   LDLCALC 69 08/05/2019   LDLDIRECT 74.0 02/26/2018   TRIG 101.0 08/05/2019   CHOLHDL 3 08/05/2019  A/P: carotid stenosis and CAD stable (will see cardiology soon to discuss aortic root dilation follow up as well as repeat of stenosis of right carotid artery) continue current meds . Lipids well controlled in past- update with labs  #Abdominal aortic aneurysm (AAA) without rupture [facility]) S: August 2020 showed distal aortic aneurysm 4.4 cm.  Stable June 2023 or improved.  Yearly follow-up needed.  Found incidentally during other work-up A/P: doing well- continue BP control and monitor   Essential hypertension, benign S: Compliant with metoprolol 50 mg extended release -  diltiazem 180 mg extended release appears to be off by fill dates, lisinopril 2.5 mg also appears to be off A/P: doing well on metoprolol alone- continue current meds     #osteoporosis- prefers to hold off on dexa. Not interested in prolia at present. Had ordered dexa last year but opted out- last 2018   Recommended follow up: Return in about 4 months (around 03/31/2022) for followup or sooner if needed.Schedule b4 you leave. Future Appointments  Date Time Provider [place]  12/21/2021 11:20 AM Warren Lacy, PA-C [facility]  02/23/2022  2:00 PM Lyndal Pulley, DO [facility] None    Lab/Order associations:   ICD-10-CM   1. Essential hypertension, benign  I10     2. Controlled type 2 diabetes mellitus without complication, without long-term current use of insulin (HCC)  E11.9 Microalbumin / creatinine urine ratio  Hemoglobin A1c    Comprehensive metabolic panel    CBC w/Diff    Lipid panel    3. Mixed hyperlipidemia  E78.2     4. Generalized weakness  R53.1 Ambulatory referral to Home Health    5. Assistance needed for mobility  Z74.1 Ambulatory referral to Home Health      No orders of the defined types were placed in this encounter.   Return precautions advised.  [name], MD

## 2021-11-29 NOTE — Patient Instructions (Addendum)
Flu shot- we should have these available within a month or two but please let us know if you get at outside pharmacy - new covid shot likely available in October at pharmacy  Please stop by lab before you go If you have mychart- we will send your results within 3 business days of [place] receiving them.  If you do not have mychart- we will call you about results within 5 business days of [place] receiving them.  *please also note that you will see labs on mychart as soon as they post. I will later go in and write notes on them- will say "notes from Dr. Yong Channel"   Recommended follow up: Return in about 4 months (around 03/31/2022) for followup or sooner if needed.Schedule b4 you leave.

## 2021-11-30 ENCOUNTER — Other Ambulatory Visit: Payer: Self-pay

## 2021-11-30 DIAGNOSIS — E782 Mixed hyperlipidemia: Secondary | ICD-10-CM | POA: Diagnosis not present

## 2021-11-30 DIAGNOSIS — E119 Type 2 diabetes mellitus without complications: Secondary | ICD-10-CM | POA: Diagnosis not present

## 2021-11-30 DIAGNOSIS — Z741 Need for assistance with personal care: Secondary | ICD-10-CM | POA: Diagnosis not present

## 2021-11-30 DIAGNOSIS — R531 Weakness: Secondary | ICD-10-CM | POA: Diagnosis not present

## 2021-11-30 DIAGNOSIS — D72829 Elevated white blood cell count, unspecified: Secondary | ICD-10-CM

## 2021-11-30 DIAGNOSIS — I10 Essential (primary) hypertension: Secondary | ICD-10-CM | POA: Diagnosis not present

## 2021-11-30 LAB — LIPID PANEL
Cholesterol: 121 mg/dL (ref 0–200)
HDL: 34.2 mg/dL — ABNORMAL LOW (ref >39.00)
LDL Cholesterol: 60 mg/dL (ref 0–99)
NonHDL: 86.55
Total CHOL/HDL Ratio: 4
Triglycerides: 131 mg/dL (ref 0.0–149.0)
VLDL: 26.2 mg/dL (ref 0.0–40.0)

## 2021-11-30 LAB — COMPREHENSIVE METABOLIC PANEL
ALT: 12 U/L (ref 0–53)
AST: 15 U/L (ref 0–37)
Albumin: 4 g/dL (ref 3.5–5.2)
Alkaline Phosphatase: 58 U/L (ref 39–117)
BUN: 30 mg/dL — ABNORMAL HIGH (ref 6–23)
CO2: 21 mEq/L (ref 19–32)
Calcium: 9.7 mg/dL (ref 8.4–10.5)
Chloride: 103 mEq/L (ref 96–112)
Creatinine, Ser: 1.09 mg/dL (ref 0.40–1.50)
GFR: 60.52 mL/min (ref >60.00)
Glucose, Bld: 79 mg/dL (ref 70–99)
Potassium: 4.6 mEq/L (ref 3.5–5.1)
Sodium: 137 mEq/L (ref 135–145)
Total Bilirubin: 0.6 mg/dL (ref 0.2–1.2)
Total Protein: 7 g/dL (ref 6.0–8.3)

## 2021-11-30 LAB — CBC WITH DIFFERENTIAL/PLATELET
Basophils Absolute: 0.1 10*3/uL (ref 0.0–0.1)
Basophils Relative: 0.5 % (ref 0.0–3.0)
Eosinophils Absolute: 0.2 10*3/uL (ref 0.0–0.7)
Eosinophils Relative: 1.4 % (ref 0.0–5.0)
HCT: 38.1 % — ABNORMAL LOW (ref 39.0–52.0)
Hemoglobin: 12.3 g/dL — ABNORMAL LOW (ref 13.0–17.0)
Lymphocytes Relative: 15.2 % (ref 12.0–46.0)
Lymphs Abs: 2.3 10*3/uL (ref 0.7–4.0)
MCHC: 32.2 g/dL (ref 30.0–36.0)
MCV: 95.6 fl (ref 78.0–100.0)
Monocytes Absolute: 1.1 10*3/uL — ABNORMAL HIGH (ref 0.1–1.0)
Monocytes Relative: 7.1 % (ref 3.0–12.0)
Neutro Abs: 11.7 10*3/uL — ABNORMAL HIGH (ref 1.4–7.7)
Neutrophils Relative %: 75.8 % (ref 43.0–77.0)
Platelets: 250 10*3/uL (ref 150.0–400.0)
RBC: 3.99 Mil/uL — ABNORMAL LOW (ref 4.22–5.81)
RDW: 14.1 % (ref 11.5–15.5)
WBC: 15.4 10*3/uL — ABNORMAL HIGH (ref 4.0–10.5)

## 2021-11-30 LAB — HEMOGLOBIN A1C: Hgb A1c MFr Bld: 6.9 % — ABNORMAL HIGH (ref 4.6–6.5)

## 2021-12-01 ENCOUNTER — Ambulatory Visit: Payer: Self-pay

## 2021-12-01 ENCOUNTER — Telehealth: Payer: Self-pay | Admitting: Family Medicine

## 2021-12-01 NOTE — Telephone Encounter (Signed)
..  Home Health Verbal Orders  Agency:  [place]: Mila Merry and [phone]  Physical Therapist   Requesting PT/:    Reason for Request:  Patient has weakness and difficulty walking  Frequency:  1 x for 1 week, 2 x for 3 weeks and then 1 x for 3 weeks  HH needs F2F w/in last 30 days

## 2021-12-01 NOTE — Patient Instructions (Signed)
Visit Information  Thank you for taking time to visit with me today. Please don't hesitate to contact me if I can be of assistance to you.   Following are the goals we discussed today:   Goals Addressed             This Visit's Progress    Care Coordination Activities       Care Coordination Interventions: Spoke with patients daughter and caregiver [name] who identifies interest in transportation resources as well as advance care planning Scheduled initial call for Monday 8/21 at 2:00 pm        Our next appointment is by telephone on 8/21 at 2:00  Please call the care guide team at [phone] if you need to cancel or reschedule your appointment.   If you are experiencing a Mental Health or [place] or need someone to talk to, please call [phone]-TALK (toll free, 24 hour hotline)  Patient verbalizes understanding of instructions and care plan provided today and agrees to view in [place]. Active MyChart status and patient understanding of how to access instructions and care plan via MyChart confirmed with patient.     Telephone follow up appointment with care management team member scheduled [place], BSW, CDP Social Worker, Certified Dementia Practitioner Care Coordination [phone]

## 2021-12-01 NOTE — Patient Outreach (Signed)
  Care Coordination   Initial Visit Note   12/01/2021 Name: KEELYN FJELSTAD MRN: [id number] DOB: Aug 29, 1930  LAVAL CAFARO is a 88 y.o. year old male who sees Yong Channel, Brayton Mars, MD for primary care. I  spoke with patients daughter and caregiver [name] by phone.  What matters to the patients health and wellness today?  To discuss advance care planning    Goals Addressed             This Visit's Progress    Care Coordination Activities       Care Coordination Interventions: Spoke with patients daughter and caregiver [name] who identifies interest in transportation resources as well as advance care planning Scheduled initial call for Monday 8/21 at 2:00 pm        SDOH assessments and interventions completed:  Yes  SDOH Interventions Today    Flowsheet Row Most Recent Value  SDOH Interventions   Food Insecurity Interventions Intervention Not Indicated  Housing Interventions Intervention Not Indicated        Care Coordination Interventions Activated:  Yes  Care Coordination Interventions:  Yes, provided   Follow up plan: Follow up call scheduled for 8/21    Encounter Outcome:  Pt. Scheduled   Daneen Schick, [place], CDP Social Worker, Certified Dementia Practitioner Care Coordination [phone]

## 2021-12-01 NOTE — Patient Outreach (Signed)
  Care Coordination   12/01/2021 Name: Steven [name] MRN: [id number] DOB: 1931-04-02   Care Coordination Outreach Attempts:  A second unsuccessful outreach was attempted today to offer the patient with information about available care coordination services as a benefit of their health plan.     Follow Up Plan:  Additional outreach attempts will be made to offer the patient care coordination information and services.   Encounter Outcome:  No Answer  Care Coordination Interventions Activated:  No   Care Coordination Interventions:  No, not indicated    Daneen Schick, BSW, CDP Social Worker, Certified Dementia Practitioner Care Coordination [phone]

## 2021-12-05 ENCOUNTER — Ambulatory Visit: Payer: Self-pay

## 2021-12-05 NOTE — Telephone Encounter (Signed)
Steven [name] called again to follow up on the PT request verbal orders

## 2021-12-05 NOTE — Patient Outreach (Signed)
  Care Coordination   12/05/2021 Name: Steven [name] MRN: [id number] DOB: 1931/01/01   Care Coordination Outreach Attempts:  A third unsuccessful outreach was attempted today to offer the patient with information about available care coordination services as a benefit of their health plan.   Follow Up Plan:  No further outreach attempts will be made at this time. We have been unable to contact the patient to offer or enroll patient in care coordination services  Encounter Outcome:  No Answer  Care Coordination Interventions Activated:  No   Care Coordination Interventions:  No, not indicated    Daneen Schick, BSW, CDP Social Worker, Certified Dementia Practitioner Care Coordination [phone]

## 2021-12-06 NOTE — Telephone Encounter (Signed)
Called and lm on [place] vm with VO. 

## 2021-12-07 DIAGNOSIS — E782 Mixed hyperlipidemia: Secondary | ICD-10-CM | POA: Diagnosis not present

## 2021-12-07 DIAGNOSIS — R531 Weakness: Secondary | ICD-10-CM | POA: Diagnosis not present

## 2021-12-07 DIAGNOSIS — Z741 Need for assistance with personal care: Secondary | ICD-10-CM | POA: Diagnosis not present

## 2021-12-07 DIAGNOSIS — I10 Essential (primary) hypertension: Secondary | ICD-10-CM | POA: Diagnosis not present

## 2021-12-07 DIAGNOSIS — E119 Type 2 diabetes mellitus without complications: Secondary | ICD-10-CM | POA: Diagnosis not present

## 2021-12-09 DIAGNOSIS — Z741 Need for assistance with personal care: Secondary | ICD-10-CM | POA: Diagnosis not present

## 2021-12-09 DIAGNOSIS — E782 Mixed hyperlipidemia: Secondary | ICD-10-CM | POA: Diagnosis not present

## 2021-12-09 DIAGNOSIS — R531 Weakness: Secondary | ICD-10-CM | POA: Diagnosis not present

## 2021-12-09 DIAGNOSIS — I10 Essential (primary) hypertension: Secondary | ICD-10-CM | POA: Diagnosis not present

## 2021-12-09 DIAGNOSIS — E119 Type 2 diabetes mellitus without complications: Secondary | ICD-10-CM | POA: Diagnosis not present

## 2021-12-16 DIAGNOSIS — R531 Weakness: Secondary | ICD-10-CM | POA: Diagnosis not present

## 2021-12-16 DIAGNOSIS — M810 Age-related osteoporosis without current pathological fracture: Secondary | ICD-10-CM | POA: Diagnosis not present

## 2021-12-16 DIAGNOSIS — I10 Essential (primary) hypertension: Secondary | ICD-10-CM | POA: Diagnosis not present

## 2021-12-16 DIAGNOSIS — E119 Type 2 diabetes mellitus without complications: Secondary | ICD-10-CM | POA: Diagnosis not present

## 2021-12-16 DIAGNOSIS — E785 Hyperlipidemia, unspecified: Secondary | ICD-10-CM | POA: Diagnosis not present

## 2021-12-21 ENCOUNTER — Ambulatory Visit: Payer: Medicare Other | Admitting: Physician Assistant

## 2021-12-22 DIAGNOSIS — E785 Hyperlipidemia, unspecified: Secondary | ICD-10-CM | POA: Diagnosis not present

## 2021-12-22 DIAGNOSIS — R531 Weakness: Secondary | ICD-10-CM | POA: Diagnosis not present

## 2021-12-22 DIAGNOSIS — M810 Age-related osteoporosis without current pathological fracture: Secondary | ICD-10-CM | POA: Diagnosis not present

## 2021-12-22 DIAGNOSIS — I10 Essential (primary) hypertension: Secondary | ICD-10-CM | POA: Diagnosis not present

## 2021-12-22 DIAGNOSIS — E119 Type 2 diabetes mellitus without complications: Secondary | ICD-10-CM | POA: Diagnosis not present

## 2021-12-23 ENCOUNTER — Ambulatory Visit: Payer: Medicare Other | Attending: Physician Assistant | Admitting: Nurse Practitioner

## 2021-12-23 NOTE — Progress Notes (Deleted)
Office Visit    Patient Name: Steven [name] Date of Encounter: 12/23/2021  Primary Care Provider:  Marin Olp, MD Primary Cardiologist:  Pixie Casino, MD  Chief Complaint    88 year old male with a history of CAD prior MI s/p PTCA/stent, s/p CABG in 2004, vasovagal syncope, frequent PVCs, hypertension, hyperlipidemia, cardiac murmur, aortic root dilation, bilateral carotid artery disease, type 2 diabetes, peripheral neuropathy, and left foot drop who presents for follow-up related to CAD.  Past Medical History    Past Medical History:  Diagnosis Date   Abnormality of gait 08/23/2012   Anemia    hx of   Arthritis 01-03-12   osteoarthritis-knees   CAD (coronary artery disease) 2004   s/p inferior wall Mi 2004 with PTCA/Stent; s/p CABG 2004   Degenerative arthritis    Diabetes mellitus    type II   Foot drop, bilateral 08/23/2012   GERD (gastroesophageal reflux disease)    Heart disease    History of kidney stones    History of myocardial infarction    Hyperlipidemia    Myocardial infarction [facility]) 01-03-12   '04   MYOCARDIAL INFARCTION, HX OF 04/19/2007   Qualifier: Diagnosis of  By: Scherrie Gerlach     Neuropathy 01-03-12   bil. feet   Pneumonia    hx of   Polyneuropathy in diabetes(357.2) 08/23/2012   Radiculopathy of lumbar region 08/23/2011   Followed by Dr. Vertell Limber. S/p surgery. Resolved after surgery.     Past Surgical History:  Procedure Laterality Date   BACK SURGERY  2013   CARDIAC CATHETERIZATION  01-03-12   '04   CATARACT EXTRACTION Bilateral 2015   COLONOSCOPY     CORONARY ARTERY BYPASS GRAFT  2004   LIMA to LAD; SVG to ramus intermedius; SVG to PDA/PLSA   LAMINECTOMY  09/08/2011   Procedure: LUMBAR LAMINECTOMY FOR TUMOR;  Surgeon: Erline Levine, MD;  Location: [place] [facility];  Service: Neurosurgery;  Laterality: Left;  Left Thoracic twelve-Lumbar one transpedicular resection of epidural mass   TONSILLECTOMY  as child   TOTAL KNEE ARTHROPLASTY  01/15/2012    Procedure: TOTAL KNEE ARTHROPLASTY;  Surgeon: Gearlean Alf, MD;  Location: [facility];  Service: Orthopedics;  Laterality: Right;   TOTAL KNEE ARTHROPLASTY Left 09/16/2012   Procedure: LEFT TOTAL KNEE ARTHROPLASTY;  Surgeon: Gearlean Alf, MD;  Location: [facility];  Service: Orthopedics;  Laterality: Left;    Allergies  Allergies  Allergen Reactions   Alendronate     reflux    History of Present Illness    88 year old male with the above past medical history including CAD prior MI s/p PTCA/stent, s/p CABG in 2004, vasovagal syncope, frequent PVCs, hypertension, hyperlipidemia, cardiac murmur, aortic root dilation, bilateral carotid artery disease, type 2 diabetes, peripheral neuropathy, and left foot drop.  He has a history of CAD with inferior MI and stent thousand 4, followed by CABG in 2004.  Stress tests, 2014, and 2019 were negative for ischemia.  Echocardiogram in 03/2020 showed EF 55 to 60%, normal LV function, no RWMA, moderate asymmetric LVH of the basal-septal segment, G1 DD, mildly reduced RV systolic function moderate MAC, mild to moderate aortic valve sclerosis without any evidence of aortic stenosis.  Additionally he has a history of carotid artery stenosis.  Most recent carotid Dopplers in 09/2020 39% B ICA stenosis.  He was last seen in the office on 03/15/2020 and was stable from a cardiac standpoint.  His physical activity was limited particularly  due to left foot drop requiring brace.  He denied symptoms concerning for angina.  Does have a history of aortic root dilation, not appreciated on most recent echocardiogram.  He presents today for follow-up.  Since his last visit  CAD: H/o syncope: PVCs: Hypertension: Hyperlipidemia: Aortic root dilation: Bilateral carotid artery disease: Type 2 diabetes: Disposition:    Home Medications    Current Outpatient Medications  Medication Sig Dispense Refill   acetaminophen (TYLENOL) 500 MG tablet Take 500 mg by mouth every 6  (six) hours as needed. Up to 6 tabs (3gm) qd     aspirin 81 MG tablet Take 81 mg by mouth daily.     atorvastatin (LIPITOR) 80 MG tablet TAKE 1 TABLET DAILY. 90 tablet 3   gabapentin (NEURONTIN) 100 MG capsule Take 1 capsule (100 mg total) by mouth at bedtime. 30 capsule 3   metFORMIN (GLUCOPHAGE) 1000 MG tablet Take 1 tablet (1,000 mg total) by mouth 2 (two) times daily with a meal. 180 tablet 3   metoprolol succinate (TOPROL-XL) 50 MG 24 hr tablet Take 1 tablet (50 mg total) by mouth daily. Take with or immediately following a meal. 90 tablet 3   No current facility-administered medications for this visit.     Review of Systems    ***.  All other systems reviewed and are otherwise negative except as noted above.    Physical Exam    VS:  There were no vitals taken for this visit. , BMI There is no height or weight on file to calculate BMI.     GEN: Well nourished, well developed, in no acute distress. HEENT: normal. Neck: Supple, no JVD, carotid bruits, or masses. Cardiac: RRR, no murmurs, rubs, or gallops. No clubbing, cyanosis, edema.  Radials/DP/PT 2+ and equal bilaterally.  Respiratory:  Respirations regular and unlabored, clear to auscultation bilaterally. GI: Soft, nontender, nondistended, BS + x 4. MS: no deformity or atrophy. Skin: warm and dry, no rash. Neuro:  Strength and sensation are intact. Psych: Normal affect.  Accessory Clinical Findings    ECG personally reviewed by me today - *** - no acute changes.   Lab Results  Component Value Date   WBC 15.4 (H) 11/29/2021   HGB 12.3 (L) 11/29/2021   HCT 38.1 (L) 11/29/2021   MCV 95.6 11/29/2021   PLT 250.0 11/29/2021   Lab Results  Component Value Date   CREATININE 1.09 11/29/2021   BUN 30 (H) 11/29/2021   NA 137 11/29/2021   K 4.6 11/29/2021   CL 103 11/29/2021   CO2 21 11/29/2021   Lab Results  Component Value Date   ALT 12 11/29/2021   AST 15 11/29/2021   ALKPHOS 58 11/29/2021   BILITOT 0.6  11/29/2021   Lab Results  Component Value Date   CHOL 121 11/29/2021   HDL 34.20 (L) 11/29/2021   LDLCALC 60 11/29/2021   LDLDIRECT 74.0 02/26/2018   TRIG 131.0 11/29/2021   CHOLHDL 4 11/29/2021    Lab Results  Component Value Date   HGBA1C 6.9 (H) 11/29/2021    Assessment & Plan    1.  ***  No BP recorded.  {Refresh Note OR Click here to enter BP  :1}***   Lenna Sciara, NP 12/23/2021, 6:17 AM

## 2021-12-27 DIAGNOSIS — R531 Weakness: Secondary | ICD-10-CM | POA: Diagnosis not present

## 2021-12-27 DIAGNOSIS — E785 Hyperlipidemia, unspecified: Secondary | ICD-10-CM | POA: Diagnosis not present

## 2021-12-27 DIAGNOSIS — I10 Essential (primary) hypertension: Secondary | ICD-10-CM | POA: Diagnosis not present

## 2021-12-27 DIAGNOSIS — M810 Age-related osteoporosis without current pathological fracture: Secondary | ICD-10-CM | POA: Diagnosis not present

## 2021-12-27 DIAGNOSIS — E119 Type 2 diabetes mellitus without complications: Secondary | ICD-10-CM | POA: Diagnosis not present

## 2022-01-03 ENCOUNTER — Telehealth: Payer: Self-pay | Admitting: Family Medicine

## 2022-01-03 NOTE — Telephone Encounter (Signed)
Noted  

## 2022-01-03 NOTE — Telephone Encounter (Signed)
  Is this a Certification or Plan of Care? YES  HH Agency: [place] Number:   [id number]  Has charge sheet been attached? YES  Where has form been placed:   PROVIDER'S BOX  Faxed to:

## 2022-01-05 DIAGNOSIS — M810 Age-related osteoporosis without current pathological fracture: Secondary | ICD-10-CM | POA: Diagnosis not present

## 2022-01-05 DIAGNOSIS — R531 Weakness: Secondary | ICD-10-CM | POA: Diagnosis not present

## 2022-01-05 DIAGNOSIS — E119 Type 2 diabetes mellitus without complications: Secondary | ICD-10-CM | POA: Diagnosis not present

## 2022-01-05 DIAGNOSIS — E785 Hyperlipidemia, unspecified: Secondary | ICD-10-CM | POA: Diagnosis not present

## 2022-01-05 DIAGNOSIS — I10 Essential (primary) hypertension: Secondary | ICD-10-CM | POA: Diagnosis not present

## 2022-01-09 ENCOUNTER — Encounter: Payer: Self-pay | Admitting: *Deleted

## 2022-01-10 DIAGNOSIS — M810 Age-related osteoporosis without current pathological fracture: Secondary | ICD-10-CM | POA: Diagnosis not present

## 2022-01-10 DIAGNOSIS — R531 Weakness: Secondary | ICD-10-CM | POA: Diagnosis not present

## 2022-01-10 DIAGNOSIS — E119 Type 2 diabetes mellitus without complications: Secondary | ICD-10-CM | POA: Diagnosis not present

## 2022-01-10 DIAGNOSIS — E785 Hyperlipidemia, unspecified: Secondary | ICD-10-CM | POA: Diagnosis not present

## 2022-01-10 DIAGNOSIS — I10 Essential (primary) hypertension: Secondary | ICD-10-CM | POA: Diagnosis not present

## 2022-02-21 NOTE — Progress Notes (Deleted)
[facility] [place] Phone: [phone] Subjective:    I'm seeing this patient by the request  of:  Marin Olp, MD  CC:   [id number]  Steven [name] is a 88 y.o. male coming in with complaint of L hip pain. Has been seeing Dr. [name] for this issue. Patient states   L hip xray 05/2020 IMPRESSION: 1. No evidence of acute or healing left pelvic or hip fracture. 2. Stable appearance of remote injury to the right pubic body.     Past Medical History:  Diagnosis Date   Abnormality of gait 08/23/2012   Anemia    hx of   Arthritis 01-03-12   osteoarthritis-knees   CAD (coronary artery disease) 2004   s/p inferior wall Mi 2004 with PTCA/Stent; s/p CABG 2004   Degenerative arthritis    Diabetes mellitus    type II   Foot drop, bilateral 08/23/2012   GERD (gastroesophageal reflux disease)    Heart disease    History of kidney stones    History of myocardial infarction    Hyperlipidemia    Myocardial infarction [facility]) 01-03-12   '04   MYOCARDIAL INFARCTION, HX OF 04/19/2007   Qualifier: Diagnosis of  By: Scherrie Gerlach     Neuropathy 01-03-12   bil. feet   Pneumonia    hx of   Polyneuropathy in diabetes(357.2) 08/23/2012   Radiculopathy of lumbar region 08/23/2011   Followed by Dr. Vertell Limber. S/p surgery. Resolved after surgery.     Past Surgical History:  Procedure Laterality Date   BACK SURGERY  2013   CARDIAC CATHETERIZATION  01-03-12   '04   CATARACT EXTRACTION Bilateral 2015   COLONOSCOPY     CORONARY ARTERY BYPASS GRAFT  2004   LIMA to LAD; SVG to ramus intermedius; SVG to PDA/PLSA   LAMINECTOMY  09/08/2011   Procedure: LUMBAR LAMINECTOMY FOR TUMOR;  Surgeon: Erline Levine, MD;  Location: [facility];  Service: Neurosurgery;  Laterality: Left;  Left Thoracic twelve-Lumbar one transpedicular resection of epidural mass   TONSILLECTOMY  as child   TOTAL KNEE ARTHROPLASTY  01/15/2012   Procedure: TOTAL KNEE  ARTHROPLASTY;  Surgeon: Gearlean Alf, MD;  Location: [facility];  Service: Orthopedics;  Laterality: Right;   TOTAL KNEE ARTHROPLASTY Left 09/16/2012   Procedure: LEFT TOTAL KNEE ARTHROPLASTY;  Surgeon: Gearlean Alf, MD;  Location: [facility];  Service: Orthopedics;  Laterality: Left;   Social History   Socioeconomic History   Marital status: Married    Spouse name: Not on file   Number of children: 2   Years of education: College   Highest education level: Not on file  Occupational History   Occupation: Research scientist (life sciences)  Tobacco Use   Smoking status: Never   Smokeless tobacco: Never  Vaping Use   Vaping Use: Never used  Substance and Sexual Activity   Alcohol use: No   Drug use: No   Sexual activity: Not Currently  Other Topics Concern   Not on file  Social History Narrative   Married 1956.  2 children. No grandkids. Neither children married.    1 daughter lives at house and watches over parents.       Retired from [employer]. Worked for hisself afterwards.       Regular exercise: no   Daily Caffeine: 3 cups coffee daily   Social Determinants of Health   Financial Resource Strain: Not on file  Food Insecurity: No Food Insecurity (12/01/2021)   Hunger Vital Sign    Worried About Running Out of Food in the Last Year: Never true    Ran Out of Food in the Last Year: Never true  Transportation Needs: Not on file  Physical Activity: Not on file  Stress: Not on file  Social Connections: Not on file   Allergies  Allergen Reactions   Alendronate     reflux   Family History  Problem Relation Age of Onset   Cirrhosis Mother        etiology unclear   Hypertension Father    Stroke Father    Cancer Sister        breast   Stroke Maternal Grandmother    Heart disease Maternal Grandfather        MI   Stroke Paternal Grandmother    Stroke Paternal Grandfather    Anesthesia problems Neg Hx    Hypotension Neg Hx    Malignant hyperthermia Neg Hx     Pseudochol deficiency Neg Hx     Current Outpatient Medications (Endocrine & Metabolic):    metFORMIN (GLUCOPHAGE) 1000 MG tablet, Take 1 tablet (1,000 mg total) by mouth 2 (two) times daily with a meal.  Current Outpatient Medications (Cardiovascular):    atorvastatin (LIPITOR) 80 MG tablet, TAKE 1 TABLET DAILY.   metoprolol succinate (TOPROL-XL) 50 MG 24 hr tablet, Take 1 tablet (50 mg total) by mouth daily. Take with or immediately following a meal.   Current Outpatient Medications (Analgesics):    acetaminophen (TYLENOL) 500 MG tablet, Take 500 mg by mouth every 6 (six) hours as needed. Up to 6 tabs (3gm) qd   aspirin 81 MG tablet, Take 81 mg by mouth daily.   Current Outpatient Medications (Other):    gabapentin (NEURONTIN) 100 MG capsule, Take 1 capsule (100 mg total) by mouth at bedtime.   Reviewed prior external information including notes and imaging from  primary care provider As well as notes that were available from care everywhere and other healthcare systems.  Past medical history, social, surgical and family history all reviewed in electronic medical record.  No pertanent information unless stated regarding to the chief complaint.   Review of Systems:  No headache, visual changes, nausea, vomiting, diarrhea, constipation, dizziness, abdominal pain, skin rash, fevers, chills, night sweats, weight loss, swollen lymph nodes, body aches, joint swelling, chest pain, shortness of breath, mood changes. POSITIVE muscle aches  Objective  There were no vitals taken for this visit.   General: No apparent distress alert and oriented x3 mood and affect normal, dressed appropriately.  HEENT: Pupils equal, extraocular movements intact  Respiratory: Patient's speak in full sentences and does not appear short of breath  Cardiovascular: No lower extremity edema, non tender, no erythema      Impression and Recommendations:

## 2022-02-23 ENCOUNTER — Ambulatory Visit: Payer: Medicare Other | Admitting: Family Medicine

## 2022-03-30 ENCOUNTER — Encounter: Payer: Self-pay | Admitting: *Deleted

## 2022-03-31 ENCOUNTER — Ambulatory Visit (INDEPENDENT_AMBULATORY_CARE_PROVIDER_SITE_OTHER): Payer: Medicare Other | Admitting: Family Medicine

## 2022-03-31 ENCOUNTER — Encounter: Payer: Self-pay | Admitting: Family Medicine

## 2022-03-31 VITALS — BP 124/70 | HR 72 | Temp 97.2°F | Ht 69.0 in

## 2022-03-31 DIAGNOSIS — E782 Mixed hyperlipidemia: Secondary | ICD-10-CM | POA: Diagnosis not present

## 2022-03-31 DIAGNOSIS — Z23 Encounter for immunization: Secondary | ICD-10-CM | POA: Diagnosis not present

## 2022-03-31 DIAGNOSIS — D692 Other nonthrombocytopenic purpura: Secondary | ICD-10-CM | POA: Diagnosis not present

## 2022-03-31 DIAGNOSIS — I10 Essential (primary) hypertension: Secondary | ICD-10-CM | POA: Diagnosis not present

## 2022-03-31 DIAGNOSIS — E119 Type 2 diabetes mellitus without complications: Secondary | ICD-10-CM | POA: Diagnosis not present

## 2022-03-31 MED ORDER — BLOOD GLUCOSE MONITOR KIT: PACK | 0 refills | Status: AC

## 2022-03-31 NOTE — Progress Notes (Signed)
Phone [phone] In person visit   Subjective:   Steven [name] is a 88 y.o. year old very pleasant male patient who presents for/with See problem oriented charting Chief Complaint  Patient presents with   Follow-up   Hypertension   Diabetes    Past Medical History-  Patient Active Problem List   Diagnosis Date Noted   Dementia ([place]) 08/30/2021    Priority: High   AAA (abdominal aortic aneurysm) ([place]) 12/13/2018    Priority: High   Falls frequently 08/21/2017    Priority: High   Aortic root dilatation ([place]) 10/06/2016    Priority: High   Osteoporosis 01/07/2015    Priority: High   CAD, ARTERY BYPASS GRAFT 05/21/2009    Priority: High   Carotid artery stenosis 04/20/2008    Priority: High   Diabetes mellitus type II, controlled ([place]) 04/19/2007    Priority: High   BPH (benign prostatic hyperplasia) 09/18/2019    Priority: Medium    Anemia 09/03/2013    Priority: Medium    Essential hypertension, benign 10/11/2012    Priority: Medium    Hyperlipidemia 04/19/2007    Priority: Medium    Seborrheic dermatitis 09/13/2017    Priority: Low   Senile purpura ([place]) 08/21/2017    Priority: Low   History of acute inferior wall MI 09/03/2015    Priority: Low   Foot drop, left 08/23/2012    Priority: Low   History of supraventricular tachycardia 01/25/2011    Priority: Low   Hypogonadism in male 02/04/2010    Priority: Low   History of colonic polyps 09/07/2009    Priority: Low   GERD 04/19/2007    Priority: Low   Hx of CABG 12/13/2017   PVC's (premature ventricular contractions) 12/13/2017    Medications- reviewed and updated Current Outpatient Medications  Medication Sig Dispense Refill   acetaminophen (TYLENOL) 500 MG tablet Take 500 mg by mouth every 6 (six) hours as needed. Up to 6 tabs (3gm) qd     aspirin 81 MG tablet Take 81 mg by mouth daily.     atorvastatin (LIPITOR) 80 MG tablet TAKE 1 TABLET DAILY. 90 tablet 3   blood glucose meter kit and supplies  KIT Dispense based on patient and insurance preference. Use up to four times daily as directed. 1 each 0   gabapentin (NEURONTIN) 100 MG capsule Take 1 capsule (100 mg total) by mouth at bedtime. 30 capsule 3   metFORMIN (GLUCOPHAGE) 1000 MG tablet Take 1 tablet (1,000 mg total) by mouth 2 (two) times daily with a meal. 180 tablet 3   metoprolol succinate (TOPROL-XL) 50 MG 24 hr tablet Take 1 tablet (50 mg total) by mouth daily. Take with or immediately following a meal. 90 tablet 3   No current facility-administered medications for this visit.     Objective:  BP 124/70   Pulse 72   Temp (!) 97.2 F (36.2 C)   Ht _0  (1.753 m)   SpO2 96%   BMI 22.15 kg/m  Gen: NAD, resting comfortably CV: RRR no murmurs rubs or gallops Lungs: CTAB no crackles, wheeze, rhonchi  Ext: trace edema Skin: warm, dry    Assessment and Plan   #Dementia- noted. Family and patient declined neurology visit or medication pursuit in past. Daughter has really good caregiver support for him.   # Diabetes S:  controlled on, metformin 1000 mg twice a day -Off Amaryl due to risk of lows-prior on 1 mg CBGs- has new caregiver and going  to try to check sugar once a week.   Lab Results  Component Value Date   HGBA1C 6.9 (H) 11/29/2021   HGBA1C 6.7 (H) 07/18/2021   HGBA1C 7.5 (H) 03/22/2021   A/P:  hopefully stable- update a1c today. Continue current meds for now  #Atherosclerosis of coronary artery bypass graft of native heart without angina pectoris S: Patient follows regularly with Dr. Debara Pickett.  He is compliant with aspirin, atorvastatin 75m, metoprolol.  CABG in 2004.  Today reports no chest pain or shortness of breath   A/P: asymptomatic/appears controlled- continue current medications    Abdominal aortic aneurysm (AAA) without rupture ([facility] S: August 2020 showed distal aortic aneurysm 4.4 cm.  Stable to improved 09/16/21 at 3.9 cm  Found incidentally during other work-up A/P: stable on most recent check-  continue to monitor   Mixed hyperlipidemia S: Patient is compliant with atorvastatin 80 mg.  LDL goal under 70 Lab Results  Component Value Date   CHOL 121 11/29/2021   HDL 34.20 (L) 11/29/2021   LDLCALC 60 11/29/2021   LDLDIRECT 74.0 02/26/2018   TRIG 131.0 11/29/2021   CHOLHDL 4 11/29/2021  A/P: stable- continue current medicines    Essential hypertension, benign S: Compliant with metoprolol 50 mg extended release BP Readings from Last 3 Encounters:  03/31/22 124/70  11/29/21 118/74  11/22/21 (!) 142/78  A/P:  stable- continue current medicines   #Osteoporosis. did not tolerate fosamax- GI effects. Wants to hold off on repeat  #senile purpura- noted. Stable. Check cbc at least annually  Recommended follow up: Return in about 4 months (around 07/31/2022) for physical or sooner if needed.Schedule b4 you leave.  Lab/Order associations:   ICD-10-CM   1. Controlled type 2 diabetes mellitus without complication, without long-term current use of insulin (HCC)  E11.9 CBC with Differential/Platelet    Comprehensive metabolic panel    Hemoglobin A1c    blood glucose meter kit and supplies KIT    2. Essential hypertension, benign  I10     3. Mixed hyperlipidemia  E78.2     4. Senile purpura (HCC) Chronic D69.2     5. Need for immunization against influenza  Z23 Flu Vaccine QUAD High Dose(Fluad)      Meds ordered this encounter  Medications   blood glucose meter kit and supplies KIT    Sig: Dispense based on patient and insurance preference. Use up to four times daily as directed.    Dispense:  1 each    Refill:  0    Order Specific Question:   Number of strips    Answer:   100    Order Specific Question:   Number of lancets    Answer:   100    Return precautions advised.  SGarret Reddish MD

## 2022-03-31 NOTE — Patient Instructions (Addendum)
Get diabetic eye exam scheduled.  Please stop by lab before you go If you have mychart- we will send your results within 3 business days of [place] receiving them.  If you do not have mychart- we will call you about results within 5 business days of [place] receiving them.  *please also note that you will see labs on mychart as soon as they post. I will later go in and write notes on them- will say "notes from Dr. Yong Channel"   You are eligible to schedule your annual wellness visit with our nurse specialist Otila Kluver.  Please consider scheduling this before you leave today   Recommended follow up: Return in about 4 months (around 07/31/2022) for physical or sooner if needed.Schedule b4 you leave.

## 2022-04-01 LAB — COMPREHENSIVE METABOLIC PANEL
AG Ratio: 1.5 (calc) (ref 1.0–2.5)
ALT: 11 U/L (ref 9–46)
AST: 15 U/L (ref 10–35)
Albumin: 3.9 g/dL (ref 3.6–5.1)
Alkaline phosphatase (APISO): 63 U/L (ref 35–144)
BUN: 23 mg/dL (ref 7–25)
CO2: 22 mmol/L (ref 20–32)
Calcium: 9.3 mg/dL (ref 8.6–10.3)
Chloride: 106 mmol/L (ref 98–110)
Creat: 1.09 mg/dL (ref 0.70–1.22)
Globulin: 2.6 g/dL (calc) (ref 1.9–3.7)
Glucose, Bld: 101 mg/dL — ABNORMAL HIGH (ref 65–99)
Potassium: 4.4 mmol/L (ref 3.5–5.3)
Sodium: 140 mmol/L (ref 135–146)
Total Bilirubin: 0.5 mg/dL (ref 0.2–1.2)
Total Protein: 6.5 g/dL (ref 6.1–8.1)

## 2022-04-01 LAB — CBC WITH DIFFERENTIAL/PLATELET
Absolute Monocytes: 651 cells/uL (ref 200–950)
Basophils Absolute: 62 cells/uL (ref 0–200)
Basophils Relative: 0.7 %
Eosinophils Absolute: 299 cells/uL (ref 15–500)
Eosinophils Relative: 3.4 %
HCT: 37 % — ABNORMAL LOW (ref 38.5–50.0)
Hemoglobin: 12.5 g/dL — ABNORMAL LOW (ref 13.2–17.1)
Lymphs Abs: 2834 cells/uL (ref 850–3900)
MCH: 32.1 pg (ref 27.0–33.0)
MCHC: 33.8 g/dL (ref 32.0–36.0)
MCV: 94.9 fL (ref 80.0–100.0)
MPV: 10 fL (ref 7.5–12.5)
Monocytes Relative: 7.4 %
Neutro Abs: 4954 cells/uL (ref 1500–7800)
Neutrophils Relative %: 56.3 %
Platelets: 223 10*3/uL (ref 140–400)
RBC: 3.9 10*6/uL — ABNORMAL LOW (ref 4.20–5.80)
RDW: 12.6 % (ref 11.0–15.0)
Total Lymphocyte: 32.2 %
WBC: 8.8 10*3/uL (ref 3.8–10.8)

## 2022-04-01 LAB — HEMOGLOBIN A1C
Hgb A1c MFr Bld: 6.6 % of total Hgb — ABNORMAL HIGH (ref <5.7)
Mean Plasma Glucose: 143 mg/dL
eAG (mmol/L): 7.9 mmol/L

## 2022-08-04 ENCOUNTER — Encounter: Payer: Medicare Other | Admitting: Family Medicine

## 2022-08-24 ENCOUNTER — Other Ambulatory Visit: Payer: Self-pay | Admitting: Family Medicine

## 2022-08-30 ENCOUNTER — Ambulatory Visit (INDEPENDENT_AMBULATORY_CARE_PROVIDER_SITE_OTHER): Payer: Medicare Other | Admitting: Family Medicine

## 2022-08-30 ENCOUNTER — Encounter: Payer: Self-pay | Admitting: Family Medicine

## 2022-08-30 VITALS — BP 130/78 | HR 50 | Temp 97.0°F | Ht 69.0 in

## 2022-08-30 DIAGNOSIS — I700 Atherosclerosis of aorta: Secondary | ICD-10-CM | POA: Diagnosis not present

## 2022-08-30 DIAGNOSIS — M21371 Foot drop, right foot: Secondary | ICD-10-CM

## 2022-08-30 DIAGNOSIS — Z7984 Long term (current) use of oral hypoglycemic drugs: Secondary | ICD-10-CM | POA: Diagnosis not present

## 2022-08-30 DIAGNOSIS — R531 Weakness: Secondary | ICD-10-CM

## 2022-08-30 DIAGNOSIS — F0390 Unspecified dementia without behavioral disturbance: Secondary | ICD-10-CM | POA: Diagnosis not present

## 2022-08-30 DIAGNOSIS — Z0000 Encounter for general adult medical examination without abnormal findings: Secondary | ICD-10-CM | POA: Diagnosis not present

## 2022-08-30 DIAGNOSIS — E119 Type 2 diabetes mellitus without complications: Secondary | ICD-10-CM | POA: Diagnosis not present

## 2022-08-30 DIAGNOSIS — I714 Abdominal aortic aneurysm, without rupture, unspecified: Secondary | ICD-10-CM | POA: Diagnosis not present

## 2022-08-30 LAB — CBC WITH DIFFERENTIAL/PLATELET
Basophils Absolute: 0 10*3/uL (ref 0.0–0.1)
Basophils Relative: 0.4 % (ref 0.0–3.0)
Eosinophils Absolute: 0.4 10*3/uL (ref 0.0–0.7)
Eosinophils Relative: 5.4 % — ABNORMAL HIGH (ref 0.0–5.0)
HCT: 35.4 % — ABNORMAL LOW (ref 39.0–52.0)
Hemoglobin: 11.8 g/dL — ABNORMAL LOW (ref 13.0–17.0)
Lymphocytes Relative: 32.8 % (ref 12.0–46.0)
Lymphs Abs: 2.3 10*3/uL (ref 0.7–4.0)
MCHC: 33.3 g/dL (ref 30.0–36.0)
MCV: 96.5 fl (ref 78.0–100.0)
Monocytes Absolute: 0.7 10*3/uL (ref 0.1–1.0)
Monocytes Relative: 9.8 % (ref 3.0–12.0)
Neutro Abs: 3.6 10*3/uL (ref 1.4–7.7)
Neutrophils Relative %: 51.6 % (ref 43.0–77.0)
Platelets: 241 10*3/uL (ref 150.0–400.0)
RBC: 3.66 Mil/uL — ABNORMAL LOW (ref 4.22–5.81)
RDW: 13.6 % (ref 11.5–15.5)
WBC: 7 10*3/uL (ref 4.0–10.5)

## 2022-08-30 LAB — COMPREHENSIVE METABOLIC PANEL
ALT: 7 U/L (ref 0–53)
AST: 10 U/L (ref 0–37)
Albumin: 3.7 g/dL (ref 3.5–5.2)
Alkaline Phosphatase: 66 U/L (ref 39–117)
BUN: 26 mg/dL — ABNORMAL HIGH (ref 6–23)
CO2: 26 mEq/L (ref 19–32)
Calcium: 9.7 mg/dL (ref 8.4–10.5)
Chloride: 107 mEq/L (ref 96–112)
Creatinine, Ser: 1.16 mg/dL (ref 0.40–1.50)
GFR: 55.87 mL/min — ABNORMAL LOW (ref >60.00)
Glucose, Bld: 99 mg/dL (ref 70–99)
Potassium: 4.9 mEq/L (ref 3.5–5.1)
Sodium: 140 mEq/L (ref 135–145)
Total Bilirubin: 0.4 mg/dL (ref 0.2–1.2)
Total Protein: 6.3 g/dL (ref 6.0–8.3)

## 2022-08-30 LAB — HEMOGLOBIN A1C: Hgb A1c MFr Bld: 6.3 % (ref 4.6–6.5)

## 2022-08-30 LAB — LIPID PANEL
Cholesterol: 199 mg/dL (ref 0–200)
HDL: 29.9 mg/dL — ABNORMAL LOW (ref >39.00)
LDL Cholesterol: 131 mg/dL — ABNORMAL HIGH (ref 0–99)
NonHDL: 169.42
Total CHOL/HDL Ratio: 7
Triglycerides: 194 mg/dL — ABNORMAL HIGH (ref 0.0–149.0)
VLDL: 38.8 mg/dL (ref 0.0–40.0)

## 2022-08-30 MED ORDER — MUPIROCIN 2 % EX OINT: 1.0000 | TOPICAL_OINTMENT | Freq: Two times a day (BID) | CUTANEOUS | 0 refills | Status: DC

## 2022-08-30 NOTE — Progress Notes (Signed)
Phone: [phone]   Subjective:  Patient presents today for their annual physical. Chief complaint-noted.   See problem oriented charting- ROS- full  review of systems was completed and negative  except for: finger irritation, irritability  The following were reviewed and entered/updated in epic: Past Medical History:  Diagnosis Date   Abnormality of gait 08/23/2012   Anemia    hx of   Arthritis 01-03-12   osteoarthritis-knees   CAD (coronary artery disease) 2004   s/p inferior wall Mi 2004 with PTCA/Stent; s/p CABG 2004   Degenerative arthritis    Diabetes mellitus    type II   Foot drop, bilateral 08/23/2012   GERD (gastroesophageal reflux disease)    Heart disease    History of kidney stones    History of myocardial infarction    Hyperlipidemia    Myocardial infarction [facility]) 01-03-12   '04   MYOCARDIAL INFARCTION, HX OF 04/19/2007   Qualifier: Diagnosis of  By: Everett Graff     Neuropathy 01-03-12   bil. feet   Pneumonia    hx of   Polyneuropathy in diabetes(357.2) 08/23/2012   Radiculopathy of lumbar region 08/23/2011   Followed by Dr. Venetia Maxon. S/p surgery. Resolved after surgery.     Patient Active Problem List   Diagnosis Date Noted   Dementia (HCC) 08/30/2021    Priority: High   AAA (abdominal aortic aneurysm) (HCC) 12/13/2018    Priority: High   Falls frequently 08/21/2017    Priority: High   Aortic root dilatation (HCC) 10/06/2016    Priority: High   Osteoporosis 01/07/2015    Priority: High   CAD, ARTERY BYPASS GRAFT 05/21/2009    Priority: High   Carotid artery stenosis 04/20/2008    Priority: High   Diabetes mellitus type II, controlled (HCC) 04/19/2007    Priority: High   Aortic atherosclerosis (HCC) 08/30/2022    Priority: Medium    BPH (benign prostatic hyperplasia) 09/18/2019    Priority: Medium    Anemia 09/03/2013    Priority: Medium    Essential hypertension, benign 10/11/2012    Priority: Medium    Hyperlipidemia 04/19/2007    Priority:  Medium    Seborrheic dermatitis 09/13/2017    Priority: Low   History of acute inferior wall MI 09/03/2015    Priority: Low   Foot drop, left 08/23/2012    Priority: Low   History of supraventricular tachycardia 01/25/2011    Priority: Low   Hypogonadism in male 02/04/2010    Priority: Low   History of colonic polyps 09/07/2009    Priority: Low   GERD 04/19/2007    Priority: Low   Hx of CABG 12/13/2017   PVC's (premature ventricular contractions) 12/13/2017   Past Surgical History:  Procedure Laterality Date   BACK SURGERY  2013   CARDIAC CATHETERIZATION  01-03-12   '04   CATARACT EXTRACTION Bilateral 2015   COLONOSCOPY     CORONARY ARTERY BYPASS GRAFT  2004   LIMA to LAD; SVG to ramus intermedius; SVG to PDA/PLSA   LAMINECTOMY  09/08/2011   Procedure: LUMBAR LAMINECTOMY FOR TUMOR;  Surgeon: Maeola Harman, MD;  Location: [facility];  Service: Neurosurgery;  Laterality: Left;  Left Thoracic twelve-Lumbar one transpedicular resection of epidural mass   TONSILLECTOMY  as child   TOTAL KNEE ARTHROPLASTY  01/15/2012   Procedure: TOTAL KNEE ARTHROPLASTY;  Surgeon: Loanne Drilling, MD;  Location: [facility];  Service: Orthopedics;  Laterality: Right;   TOTAL KNEE ARTHROPLASTY Left 09/16/2012  Procedure: LEFT TOTAL KNEE ARTHROPLASTY;  Surgeon: Loanne Drilling, MD;  Location: [facility];  Service: Orthopedics;  Laterality: Left;    Family History  Problem Relation Age of Onset   Cirrhosis Mother        etiology unclear   Hypertension Father    Stroke Father    Cancer Sister        breast   Stroke Maternal Grandmother    Heart disease Maternal Grandfather        MI   Stroke Paternal Grandmother    Stroke Paternal Grandfather    Anesthesia problems Neg Hx    Hypotension Neg Hx    Malignant hyperthermia Neg Hx    Pseudochol deficiency Neg Hx     Medications- reviewed and updated Current Outpatient Medications  Medication Sig Dispense Refill   acetaminophen (TYLENOL) 500 MG tablet  Take 500 mg by mouth every 6 (six) hours as needed. Up to 6 tabs (3gm) qd     aspirin 81 MG tablet Take 81 mg by mouth daily.     atorvastatin (LIPITOR) 80 MG tablet TAKE 1 TABLET DAILY. 90 tablet 3   blood glucose meter kit and supplies KIT Dispense based on patient and insurance preference. Use up to four times daily as directed. 1 each 0   gabapentin (NEURONTIN) 100 MG capsule Take 1 capsule (100 mg total) by mouth at bedtime. 30 capsule 3   metFORMIN (GLUCOPHAGE) 1000 MG tablet TAKE 1 TABLET (1,000 MG TOTAL) BY MOUTH TWICE A DAY WITH FOOD 180 tablet 3   metoprolol succinate (TOPROL-XL) 50 MG 24 hr tablet Take 1 tablet (50 mg total) by mouth daily. Take with or immediately following a meal. 90 tablet 3   No current facility-administered medications for this visit.    Allergies-reviewed and updated Allergies  Allergen Reactions   Alendronate     reflux    Social History   Social History Narrative   Married 1956.  2 children. No grandkids. Neither children married.    1 daughter lives at house and watches over parents.       Retired from [employer]. Worked for hisself afterwards.       Regular exercise: no   Daily Caffeine: 3 cups coffee daily   Objective  Objective:  BP 130/78   Pulse (!) 50   Temp (!) 97 F (36.1 C)   Ht 5\' 9"  (1.753 m)   SpO2 97%   BMI 22.15 kg/m  Gen: NAD, resting comfortably HEENT: Mucous membranes are moist. Oropharynx normal, tympanic membrane normal on right, cerumen dry noted on left Neck: no thyromegaly CV: RRR (slightly bradycardic) no murmurs rubs or gallops Lungs: CTAB no crackles, wheeze, rhonchi Abdomen: soft/nontender/nondistended/normal bowel sounds. No rebound or guarding.  Ext: no edema Skin: warm, dry Neuro: grossly normal, moves all extremities, PERRLA    Diabetic Foot Exam - Simple   Simple Foot Form Diabetic Foot exam was performed with the following findings: Yes 08/30/2022 10:06 AM  Visual Inspection No  deformities, no ulcerations, no other skin breakdown bilaterally: Yes Sensation Testing Intact to touch and monofilament testing bilaterally: Yes Pulse Check Posterior Tibialis and Dorsalis pulse intact bilaterally: Yes Comments Onychomycosis noted multiple toes        Assessment and Plan  89 y.o. male presenting for annual physical.  Health Maintenance counseling: 1. Anticipatory guidance: Patient counseled regarding regular dental exams -q6 months advised, eye exams - advised yearly- needs to schedule,  avoiding smoking and second hand  smoke , limiting alcohol to 2 beverages per day , no illicit drugs .   2. Risk factor reduction:  Advised patient of need for regular exercise and diet rich and fruits and vegetables to reduce risk of heart attack and stroke.  Exercise- limited mobility but trying to at least get out into fresh air.  Diet/weight management-stable weight from last year- eating reasonably healthy but some sugar cravings.  Wt Readings from Last 3 Encounters:  11/29/21 150 lb (68 kg)  08/30/21 150 lb (68 kg)  08/22/21 155 lb (70.3 kg)  3. Immunizations/screenings/ancillary studies- shingrix at pharmacy, COVID shot advised at pharmacy Immunization History  Administered Date(s) Administered   Fluad Quad(high Dose 65+) 12/26/2018, 01/23/2020, 03/31/2022   Influenza Split 01/25/2011, 01/16/2012   Influenza Whole 04/26/2007, 01/27/2008, 02/09/2009, 01/18/2010   Influenza, High Dose Seasonal PF 01/06/2016, 02/07/2017, 01/30/2018   Influenza,inj,Quad PF,6+ Mos 12/30/2012, 01/15/2014, 12/25/2014   PFIZER(Purple Top)SARS-COV-2 Vaccination 06/30/2019, 08/04/2019   Pneumococcal Conjugate-13 02/03/2013   Pneumococcal Polysaccharide-23 02/25/1998, 01/25/2011   Td 02/25/1998, 12/22/2008, 01/23/2020   Zoster, Live 01/27/2008  4. Prostate cancer screening- past age based screening recommendations   Lab Results  Component Value Date   PSA 1.4 11/18/2019   PSA 1.01 02/03/2013   PSA  1.61 05/09/2010   5. Colon cancer screening - past age based screening recommendations  6. Skin cancer screening- no dermatologist. advised regular sunscreen use. Denies worrisome, changing, or new skin lesions- other than bruises easily 7. Smoking associated screening (lung cancer screening, AAA screen 65-75, UA)- neve smoker 8. STD screening - not dating  Status of chronic or acute concerns   #Dementia S: MMSE 13 out of 30 on 08/30/2021. Mild continued decline- varies day to day - B12 and TSH normal - Family and patient declined neurology visit or medication pursuit -some irritation with position changes/doing something he doesn't want to do- wants to monitor- acpace and retry helps A/P: reasonably stable- continue to monitor- wants to hold off on medicine - would still want resuscitation- but discussed with overall health considering possible DNR/DNI in future   # Diabetes S:  controlled on, metformin 1000 mg twice a day -Off Amaryl due to risk of lows-prior on 1 mg Lab Results  Component Value Date   HGBA1C 6.6 (H) 03/31/2022   HGBA1C 6.9 (H) 11/29/2021   HGBA1C 6.7 (H) 07/18/2021   A/P: hopefully stable- update a1c today. Continue current meds for now   #Stenosis of right carotid artery S: October 10, 2018 moderate right and mild left carotid artery stenosis with plan to repeat in 1 year per Dr. Carollee Herter- we checked this in 2022 and under 40% A/P: we opted to hold off this year on recheck to minimize procedures   #Atherosclerosis of coronary artery bypass graft of native heart without angina pectoris # Aortic atherosclerosis S: Patient follows regularly with Dr. Rennis Golden.  He is compliant with aspirin, statin, metoprolol.  CABG in 2004.  Today reports no chest pain or shortness of breath  but not very active A/P: coronary artery disease asymptomatic but not very active- continue current medications Aortic atherosclerosis (presumed stable)- LDL goal ideally <70 - update lipids as below-  prefer LDL under 70   Aortic root dilatation (HCC) S: Last imaged October 19, 2016 through Dr. Thomasene Mohair aortic root size at that time. A/P: on 2021 echocardiogram no aortic root dilation noted- hold off on repeat imaging unless cardiology recommends   #Abdominal aortic aneurysm (AAA) without rupture [facility]) S: August 2020 showed distal  aortic aneurysm 4.4 cm.  Stable to improved6/2/23 at 3.9 cm  Found incidentally during other work-up A/P: we will update that this year- ordered today  Mixed hyperlipidemia S: Patient is compliant with atorvastatin 80 mg.  LDL goal under 70 Lab Results  Component Value Date   CHOL 121 11/29/2021   HDL 34.20 (L) 11/29/2021   LDLCALC 60 11/29/2021   LDLDIRECT 74.0 02/26/2018   TRIG 131.0 11/29/2021   CHOLHDL 4 11/29/2021  A/P: hopefully stable- update lipid panel today. Continue current meds for now    Essential hypertension, benign S: Compliant with metoprolol 50 mg extended release -  diltiazem 180 mg extended release appears to be off by fill dates, lisinopril 2.5 mg also appears to be off BP Readings from Last 3 Encounters:  08/30/22 130/78  03/31/22 124/70  11/29/21 118/74  A/P: stable- continue current medicines    #Osteoporosis S: did not tolerate fosamax- GI effects. , calcium, vitamin D A/P: main thing here is to avoid fall risk as wants to hold off on further workup  #foot drop- works with podiatry but is affecting his transfers- has noted worsening weakness- refer back to home health- had improvement in the past  #Left 2nd finger- some irritation along the nail bed- noted since January- he does tend to pick at it some. Trial mupiroin- see directions  #prior hip pain better- wants to hold off on films from last year  Recommended follow up: Return in about 6 months (around 03/02/2023) for followup or sooner if needed.Schedule b4 you leave.  Lab/Order associations:NOT fasting   ICD-10-CM   1. Preventative health care  Z00.00     2.  Controlled type 2 diabetes mellitus without complication, without long-term current use of insulin (HCC)  E11.9     3. Dementia without behavioral disturbance, psychotic disturbance, mood disturbance, or anxiety, unspecified dementia severity, unspecified dementia type (HCC) Chronic F03.90     4. Abdominal aortic aneurysm (AAA) without rupture, unspecified part (HCC) Chronic I71.40     5. Aortic atherosclerosis (HCC)  I70.0       No orders of the defined types were placed in this encounter.   Return precautions advised.  Tana Conch, MD

## 2022-08-30 NOTE — Patient Instructions (Addendum)
Get diabetic eye exam scheduled  Schedule dental follow up   You are eligible to schedule your annual wellness visit with our nurse specialist Inetta Fermo.  Please consider scheduling this before you leave today  Consider shingrix at pharmacy - also COVID shot  Could try debrox for the left ear- right ear looks great  Please stop by lab before you go If you have mychart- we will send your results within 3 business days of [place] receiving them.  If you do not have mychart- we will call you about results within 5 business days of [place] receiving them.  *please also note that you will see labs on mychart as soon as they post. I will later go in and write notes on them- will say "notes from Dr. Durene Cal"   Recommended follow up: Return in about 6 months (around 03/02/2023) for followup or sooner if needed.Schedule b4 you leave.

## 2022-09-04 ENCOUNTER — Ambulatory Visit (HOSPITAL_COMMUNITY): Payer: Medicare Other

## 2022-09-14 ENCOUNTER — Ambulatory Visit (HOSPITAL_COMMUNITY)
Admission: RE | Admit: 2022-09-14 | Discharge: 2022-09-14 | Disposition: A | Discharge status: Home / Self Care | Payer: Medicare Other | Source: Ambulatory Visit | Attending: Family Medicine | Admitting: Family Medicine

## 2022-09-14 DIAGNOSIS — I714 Abdominal aortic aneurysm, without rupture, unspecified: Secondary | ICD-10-CM

## 2022-10-16 ENCOUNTER — Other Ambulatory Visit: Payer: Self-pay | Admitting: Family Medicine

## 2022-11-14 ENCOUNTER — Ambulatory Visit: Payer: Medicare Other | Admitting: Podiatry

## 2022-11-14 ENCOUNTER — Encounter: Payer: Self-pay | Admitting: Podiatry

## 2022-11-14 DIAGNOSIS — M79675 Pain in left toe(s): Secondary | ICD-10-CM | POA: Diagnosis not present

## 2022-11-14 DIAGNOSIS — B351 Tinea unguium: Secondary | ICD-10-CM | POA: Diagnosis not present

## 2022-11-14 DIAGNOSIS — M2141 Flat foot [pes planus] (acquired), right foot: Secondary | ICD-10-CM

## 2022-11-14 DIAGNOSIS — M21371 Foot drop, right foot: Secondary | ICD-10-CM | POA: Diagnosis not present

## 2022-11-14 DIAGNOSIS — E1151 Type 2 diabetes mellitus with diabetic peripheral angiopathy without gangrene: Secondary | ICD-10-CM

## 2022-11-14 DIAGNOSIS — M79674 Pain in right toe(s): Secondary | ICD-10-CM | POA: Diagnosis not present

## 2022-11-14 DIAGNOSIS — M2142 Flat foot [pes planus] (acquired), left foot: Secondary | ICD-10-CM | POA: Diagnosis not present

## 2022-11-19 NOTE — Progress Notes (Signed)
  Subjective:  Patient ID: Steven [name], male    DOB: 1930-10-13,  MRN: [id number]  Steven [name] presents to clinic today for at risk foot care. Pt has h/o NIDDM with PAD and painful elongated mycotic toenails 1-5 bilaterally which are tender when wearing enclosed shoe gear. Pain is relieved with periodic professional debridement. Patient is requesting diabetic shoes on today's visit. Chief Complaint  Patient presents with   Nail Problem    DFC,Referring Provider Shelva Majestic, MD,LOV:05/24,A1C:6.3      New problem(s): None.   PCP is Shelva Majestic, MD.  Allergies  Allergen Reactions   Alendronate     reflux    Review of Systems: Negative except as noted in the HPI.  Objective: No changes noted in today's physical examination. There were no vitals filed for this visit. Steven [name] is a pleasant 89 y.o. male WD, WN in NAD. AAO x 3.  Vascular Examination: CFT <3 seconds b/l. DP pulses faintly palpable b/l. PT pulses nonpalpable b/l. Digital hair absent. Skin temperature gradient warm to warm b/l. No pain with calf compression. No ischemia or gangrene. No cyanosis or clubbing noted b/l.    Neurological Examination: Pt has subjective symptoms of neuropathy. Protective sensation decreased with 10 gram monofilament b/l.  Dermatological Examination: Pedal skin warm and supple b/l. No open wounds b/l. No interdigital macerations. Toenails 1-5 b/l thick, discolored, elongated with subungual debris and pain on dorsal palpation.  No corns, calluses nor porokeratotic lesions noted.  Musculoskeletal Examination: Dropfoot right lower extremity. Wearing AFO on right lower extremity. Pes planus deformity noted bilateral LE.  Radiographs: None  Last HgA1c:      Latest Ref Rng & Units 08/30/2022   10:20 AM 03/31/2022    4:12 PM 11/29/2021    4:02 PM  Hemoglobin A1C  Hemoglobin-A1c 4.6 - 6.5 % 6.3  6.6  6.9    Assessment/Plan: 1. Pain due to onychomycosis of toenails of  both feet   2. Pes planus of both feet   3. Foot drop, right foot   4. Diabetes mellitus with peripheral vascular disease (HCC)    Orders Placed This Encounter  Procedures   For home use only DME Other see comment    For calendar year 2024, dispense one pair extra depth brace shoes to accommodate an AFO for RLE. Dispense 3 total contact insoles for LLE.    Order Specific Question:   Length of Need    Answer:   12 Months  -Patient was evaluated and treated. All patient's and/or POA's questions/concerns answered on today's visit. -Continue foot and shoe inspections daily. Monitor blood glucose per PCP/Endocrinologist's recommendations. -Patient to continue soft, supportive shoe gear daily. Start procedure for diabetic shoes. Patient qualifies based on diagnoses. -Toenails 1-5 b/l were debrided in length and girth with sterile nail nippers and dremel without iatrogenic bleeding.  -Patient/POA to call should there be question/concern in the interim.   Return in about 3 months (around 02/14/2023).  Freddie Breech, DPM

## 2022-12-05 ENCOUNTER — Ambulatory Visit (INDEPENDENT_AMBULATORY_CARE_PROVIDER_SITE_OTHER): Payer: Medicare Other | Admitting: Podiatry

## 2022-12-05 DIAGNOSIS — M2041 Other hammer toe(s) (acquired), right foot: Secondary | ICD-10-CM

## 2022-12-05 DIAGNOSIS — L03115 Cellulitis of right lower limb: Secondary | ICD-10-CM | POA: Diagnosis not present

## 2022-12-05 DIAGNOSIS — E11621 Type 2 diabetes mellitus with foot ulcer: Secondary | ICD-10-CM | POA: Diagnosis not present

## 2022-12-05 DIAGNOSIS — E1151 Type 2 diabetes mellitus with diabetic peripheral angiopathy without gangrene: Secondary | ICD-10-CM | POA: Diagnosis not present

## 2022-12-05 DIAGNOSIS — L97511 Non-pressure chronic ulcer of other part of right foot limited to breakdown of skin: Secondary | ICD-10-CM

## 2022-12-05 MED ORDER — AMOXICILLIN-POT CLAVULANATE 875-125 MG PO TABS: 1.0000 | ORAL_TABLET | Freq: Two times a day (BID) | ORAL | 0 refills | Status: DC

## 2022-12-05 NOTE — Progress Notes (Signed)
HPI: 89 y.o. male presenting today with a nurse for concern of a wound to the plantar aspect of the right forefoot near the fifth toe.  The patient has started developing pain on the dorsal aspect of the left forefoot, and the nurse points to the third and fourth metatarsal shaft area.  Patient is in a wheelchair today but was able to transfer to the exam chair.  He does have dementia and does not answer many questions himself.  Past Medical History:  Diagnosis Date   Abnormality of gait 08/23/2012   Anemia    hx of   Arthritis 01-03-12   osteoarthritis-knees   CAD (coronary artery disease) 2004   s/p inferior wall Mi 2004 with PTCA/Stent; s/p CABG 2004   Degenerative arthritis    Diabetes mellitus    type II   Foot drop, bilateral 08/23/2012   GERD (gastroesophageal reflux disease)    Heart disease    History of kidney stones    History of myocardial infarction    Hyperlipidemia    Myocardial infarction [facility]) 01-03-12   '04   MYOCARDIAL INFARCTION, HX OF 04/19/2007   Qualifier: Diagnosis of  By: Everett Graff     Neuropathy 01-03-12   bil. feet   Pneumonia    hx of   Polyneuropathy in diabetes(357.2) 08/23/2012   Radiculopathy of lumbar region 08/23/2011   Followed by Dr. Venetia Maxon. S/p surgery. Resolved after surgery.      Past Surgical History:  Procedure Laterality Date   BACK SURGERY  2013   CARDIAC CATHETERIZATION  01-03-12   '04   CATARACT EXTRACTION Bilateral 2015   COLONOSCOPY     CORONARY ARTERY BYPASS GRAFT  2004   LIMA to LAD; SVG to ramus intermedius; SVG to PDA/PLSA   LAMINECTOMY  09/08/2011   Procedure: LUMBAR LAMINECTOMY FOR TUMOR;  Surgeon: Maeola Harman, MD;  Location: [facility];  Service: Neurosurgery;  Laterality: Left;  Left Thoracic twelve-Lumbar one transpedicular resection of epidural mass   TONSILLECTOMY  as child   TOTAL KNEE ARTHROPLASTY  01/15/2012   Procedure: TOTAL KNEE ARTHROPLASTY;  Surgeon: Loanne Drilling, MD;  Location: [facility];  Service:  Orthopedics;  Laterality: Right;   TOTAL KNEE ARTHROPLASTY Left 09/16/2012   Procedure: LEFT TOTAL KNEE ARTHROPLASTY;  Surgeon: Loanne Drilling, MD;  Location: [facility];  Service: Orthopedics;  Laterality: Left;    Allergies  Allergen Reactions   Alendronate     reflux    PHYSICAL EXAM: There were no vitals filed for this visit.  General: The patient is alert but not completely oriented to place/time and not fully cooperative.  Dermatology: Calor present to the plantar aspect of the right foot near the fifth metatarsal head with mild localized rubor with a small red streak extending 50% towards the instep.  There is pain on palpation of the wound.  No purulence or necrosis is seen.  There is minimal forefoot edema present    Wound 1 description:  Location: Right submet 5    Depth: To dermis    Wound Border: Erythematous with calor and pain on palpation    Wound Base: Granular Odor?:  None    Surrounding Tissue: Calor and rubor    Infected?:  Yes    Necrosis?:  No    Pain?:  Yes    Tunneling: No    Dimensions (cm): 0.8 cm x 0.9 cm x 0.1 cm  Vascular: Pedal pulses are diminished, but palpable right foot  Neurological: Light touch sensation diminished  Musculoskeletal Exam: Prominent fifth metatarsal head and plantar aspect of the fifth metatarsal right foot.     Latest Ref Rng & Units 08/30/2022   10:20 AM 03/31/2022    4:12 PM  Hemoglobin A1C  Hemoglobin-A1c 4.6 - 6.5 % 6.3  6.6    ASSESSMENT / PLAN OF CARE: 1. Type II diabetes mellitus with peripheral circulatory disorder (HCC)   2. Hammertoe of right foot   3. Ulcer of right foot, limited to breakdown of skin (HCC)   4. Type 2 diabetes mellitus with foot ulcer (CODE) (HCC)     Meds ordered this encounter  Medications   amoxicillin-clavulanate (AUGMENTIN) 875-125 MG tablet    Sig: Take 1 tablet by mouth 2 (two) times daily.    Dispense:  20 tablet    Refill:  0   FOR HOME USE ONLY DME DIABETIC SHOE  Patient will  benefit from new diabetic shoes with custom diabetic insoles to offload the fifth metatarsal head on the right foot.  Will get him set up with a diabetic shoe consultation within the next 1 to 2 weeks.  Informed the patient's nurse that his diffuse dorsal forefoot pain is most likely from the infection that is developing from the submet 5 ulceration.  She was shown a small red streak, that she was not aware of this morning, heading towards the instep on the plantar aspect of the right foot and it was noted that infection can spread through the compartments of the foot to the dorsal aspect of the foot and cause discomfort.  The possible arthritic joints were gently moved through range of motion while the patient and nurse were talking and there was no pain on palpation of the suspected arthritic joint and no pain/crepitus noted.  I feel that the patient's overall discomfort is secondary to the infected ulcer antibiotic was sent into patient's pharmacy.  His nurse thought that someone will pick up the medication and it did not need to be marked as "deliver to patient's residence".  Antibiotic ointment and DSD applied to right submet 5 ulceration.    Reviewed daily dressing changes with patient and his nurse.  Discussed risks / concerns regarding ulcer with patient and possible sequelae if left untreated.  Stressed importance of infection prevention at home. Short-term goals are: Resolved infection, off-load ulcer, heal ulcer Long-term goals are:  prevent recurrence, prevent amputation.   Return in about 2 weeks (around 12/19/2022) for diabetic shoe consult with Nicki Guadalajara.   Clerance Lav, DPM, FACFAS [facility]     [address] [place], [place] [address]                Office [phone]  Fax [phone]

## 2022-12-21 ENCOUNTER — Ambulatory Visit: Payer: Medicare Other

## 2022-12-21 DIAGNOSIS — E1151 Type 2 diabetes mellitus with diabetic peripheral angiopathy without gangrene: Secondary | ICD-10-CM

## 2022-12-21 NOTE — Progress Notes (Signed)
Patient presents to the office today for diabetic shoe and insole measuring.  Patient was measured with brannock device to determine size and width for 1 pair of extra depth shoes and foam casted for 3 pair of insoles.   Documentation of medical necessity will be sent to patient's treating diabetic doctor to verify and sign.   Patient's diabetic provider: Tana Conch MD  Shoes and insoles will be ordered at that time and patient will be notified for an appointment for fitting when they arrive.   Shoe size (per patient): 9.5 Brannock measurement: 9 Patient shoe selection- Shoe choice:   [id number] Shoe size ordered: 9.5 WD  Financials signed by Daughter Misty Stanley POA

## 2022-12-25 ENCOUNTER — Encounter: Payer: Self-pay | Admitting: Family

## 2022-12-25 ENCOUNTER — Ambulatory Visit (INDEPENDENT_AMBULATORY_CARE_PROVIDER_SITE_OTHER): Payer: Medicare Other | Admitting: Family

## 2022-12-25 VITALS — BP 104/58 | HR 53 | Temp 98.4°F | Ht 69.0 in

## 2022-12-25 DIAGNOSIS — B372 Candidiasis of skin and nail: Secondary | ICD-10-CM

## 2022-12-25 DIAGNOSIS — R300 Dysuria: Secondary | ICD-10-CM

## 2022-12-25 MED ORDER — CLOTRIMAZOLE 1 % EX CREA
1.0000 | TOPICAL_CREAM | Freq: Two times a day (BID) | CUTANEOUS | 1 refills | Status: DC
Start: 2022-12-25 — End: 2023-04-24

## 2022-12-25 MED ORDER — FLUCONAZOLE 150 MG PO TABS
ORAL_TABLET | ORAL | 0 refills | Status: DC
Start: 2022-12-25 — End: 2023-04-24

## 2022-12-25 NOTE — Progress Notes (Signed)
Patient ID: Steven [name], male    DOB: 08/29/1930, 89 y.o.   MRN: [id number]  Chief Complaint  Patient presents with   Testicle Pain    Pt states that there has been redness, swelling and discharge. X3days, x1day dysuria, x3day Hematuria, no abd/flank pain, loose stools    HPI:     UTI sx:  pt here with cg and reports burning with urination, penile and scrotal skin is red and irritated (scaling) for last 3 days, had previously been on Augmentin for a foot infection and he finished this about a week ago. cg present reports seeing a little whitish penile discharge as well.     Assessment & Plan:  1. Dysuria pt unable to void today, will try at home & drop off later or tomorrow. Advised on drinking 2L water daily.  - POCT Urinalysis Dipstick; Future  2. Yeast dermatitis based on described sx, believe pt has yeast infection on penis and perineum. Advised pt and cg present on use & SE of cream and difulcan. RTO precautions given.  - fluconazole (DIFLUCAN) 150 MG tablet; Take 1 pill today and the 2nd pill in 3 days.  Dispense: 2 tablet; Refill: 0 - clotrimazole (CLOTRIMAZOLE AF) 1 % cream; Apply 1 Application topically 2 (two) times daily. Apply to irritated areas on penis and scrotum, groin.  Dispense: 30 g; Refill: 1   Subjective:    Outpatient Medications Prior to Visit  Medication Sig Dispense Refill   acetaminophen (TYLENOL) 500 MG tablet Take 500 mg by mouth every 6 (six) hours as needed. Up to 6 tabs (3gm) qd     amoxicillin-clavulanate (AUGMENTIN) 875-125 MG tablet Take 1 tablet by mouth 2 (two) times daily. 20 tablet 0   aspirin 81 MG tablet Take 81 mg by mouth daily.     atorvastatin (LIPITOR) 80 MG tablet TAKE 1 TABLET DAILY. 90 tablet 3   blood glucose meter kit and supplies KIT Dispense based on patient and insurance preference. Use up to four times daily as directed. 1 each 0   gabapentin (NEURONTIN) 100 MG capsule Take 1 capsule (100 mg total) by mouth at bedtime. 30  capsule 3   metFORMIN (GLUCOPHAGE) 1000 MG tablet TAKE 1 TABLET (1,000 MG TOTAL) BY MOUTH TWICE A DAY WITH FOOD 180 tablet 3   metoprolol succinate (TOPROL-XL) 50 MG 24 hr tablet TAKE 1 TABLET BY MOUTH DAILY. TAKE WITH OR IMMEDIATELY FOLLOWING A MEAL. 90 tablet 3   mupirocin ointment (BACTROBAN) 2 % Apply 1 Application topically 2 (two) times daily. For 7 days. Do warm soak then dry thoroughly before putting this on 22 g 0   No facility-administered medications prior to visit.   Past Medical History:  Diagnosis Date   Abnormality of gait 08/23/2012   Anemia    hx of   Arthritis 01-03-12   osteoarthritis-knees   CAD (coronary artery disease) 2004   s/p inferior wall Mi 2004 with PTCA/Stent; s/p CABG 2004   Degenerative arthritis    Diabetes mellitus    type II   Foot drop, bilateral 08/23/2012   GERD (gastroesophageal reflux disease)    Heart disease    History of kidney stones    History of myocardial infarction    Hyperlipidemia    Myocardial infarction [facility] 01-03-12   '04   MYOCARDIAL INFARCTION, HX OF 04/19/2007   Qualifier: Diagnosis of  By: Everett Graff     Neuropathy 01-03-12   bil. feet   Pneumonia  hx of   Polyneuropathy in diabetes(357.2) 08/23/2012   Radiculopathy of lumbar region 08/23/2011   Followed by Dr. Venetia Maxon. S/p surgery. Resolved after surgery.     Past Surgical History:  Procedure Laterality Date   BACK SURGERY  2013   CARDIAC CATHETERIZATION  01-03-12   '04   CATARACT EXTRACTION Bilateral 2015   COLONOSCOPY     CORONARY ARTERY BYPASS GRAFT  2004   LIMA to LAD; SVG to ramus intermedius; SVG to PDA/PLSA   LAMINECTOMY  09/08/2011   Procedure: LUMBAR LAMINECTOMY FOR TUMOR;  Surgeon: Maeola Harman, MD;  Location: [facility];  Service: Neurosurgery;  Laterality: Left;  Left Thoracic twelve-Lumbar one transpedicular resection of epidural mass   TONSILLECTOMY  as child   TOTAL KNEE ARTHROPLASTY  01/15/2012   Procedure: TOTAL KNEE ARTHROPLASTY;  Surgeon: Loanne Drilling, MD;  Location: [facility];  Service: Orthopedics;  Laterality: Right;   TOTAL KNEE ARTHROPLASTY Left 09/16/2012   Procedure: LEFT TOTAL KNEE ARTHROPLASTY;  Surgeon: Loanne Drilling, MD;  Location: [facility];  Service: Orthopedics;  Laterality: Left;   Allergies  Allergen Reactions   Alendronate     reflux      Objective:    Physical Exam Vitals and nursing note reviewed.  Constitutional:      General: He is not in acute distress.    Appearance: Normal appearance.  HENT:     Head: Normocephalic.  Cardiovascular:     Rate and Rhythm: Normal rate and regular rhythm.  Pulmonary:     Effort: Pulmonary effort is normal.     Breath sounds: Normal breath sounds.  Musculoskeletal:        General: Normal range of motion.     Cervical back: Normal range of motion.  Skin:    General: Skin is warm and dry.  Neurological:     Mental Status: He is alert and oriented to person, place, and time.  Psychiatric:        Mood and Affect: Mood normal.    BP (!) 104/58   Pulse (!) 53   Temp 98.4 F (36.9 C) (Temporal)   Ht 5\' 9"  (1.753 m)   SpO2 100%   BMI 22.15 kg/m  Wt Readings from Last 3 Encounters:  11/29/21 150 lb (68 kg)  08/30/21 150 lb (68 kg)  08/22/21 155 lb (70.3 kg)       Dulce Sellar, NP

## 2022-12-26 ENCOUNTER — Other Ambulatory Visit: Payer: Medicare Other

## 2022-12-26 LAB — POCT URINALYSIS DIPSTICK
Bilirubin, UA: NEGATIVE
Blood, UA: NEGATIVE
Glucose, UA: NEGATIVE
Ketones, UA: POSITIVE — AB
Nitrite, UA: NEGATIVE
Protein, UA: NEGATIVE
Spec Grav, UA: 1.02 (ref 1.010–1.025)
Urobilinogen, UA: 0.2 U/dL
pH, UA: 5 (ref 5.0–8.0)

## 2022-12-26 NOTE — Addendum Note (Signed)
Addended by: Candie Chroman on: 12/26/2022 04:25 PM   Modules accepted: Orders

## 2022-12-26 NOTE — Addendum Note (Signed)
Addended by: Candie Chroman on: 12/26/2022 04:26 PM   Modules accepted: Orders

## 2023-01-01 ENCOUNTER — Other Ambulatory Visit: Payer: Self-pay | Admitting: Family

## 2023-01-01 DIAGNOSIS — N3090 Cystitis, unspecified without hematuria: Secondary | ICD-10-CM

## 2023-01-01 MED ORDER — CEPHALEXIN 500 MG PO CAPS
500.0000 mg | ORAL_CAPSULE | Freq: Three times a day (TID) | ORAL | 0 refills | Status: AC
Start: 2023-01-01 — End: 2023-01-06

## 2023-02-14 ENCOUNTER — Ambulatory Visit (INDEPENDENT_AMBULATORY_CARE_PROVIDER_SITE_OTHER): Payer: Medicare Other | Admitting: Podiatry

## 2023-02-14 DIAGNOSIS — Z91199 Patient's noncompliance with other medical treatment and regimen due to unspecified reason: Secondary | ICD-10-CM

## 2023-02-15 ENCOUNTER — Encounter: Payer: Self-pay | Admitting: Physician Assistant

## 2023-02-15 ENCOUNTER — Ambulatory Visit (INDEPENDENT_AMBULATORY_CARE_PROVIDER_SITE_OTHER): Payer: Medicare Other | Admitting: Physician Assistant

## 2023-02-15 VITALS — BP 124/70 | HR 61 | Temp 100.0°F | Ht 69.0 in

## 2023-02-15 DIAGNOSIS — R059 Cough, unspecified: Secondary | ICD-10-CM

## 2023-02-15 LAB — POC INFLUENZA A&B (BINAX/QUICKVUE)
Influenza A, POC: NEGATIVE
Influenza B, POC: NEGATIVE

## 2023-02-15 LAB — POC COVID19 BINAXNOW: SARS Coronavirus 2 Ag: NEGATIVE

## 2023-02-15 MED ORDER — AMOXICILLIN-POT CLAVULANATE 875-125 MG PO TABS
1.0000 | ORAL_TABLET | Freq: Two times a day (BID) | ORAL | 0 refills | Status: DC
Start: 2023-02-15 — End: 2023-04-24

## 2023-02-15 NOTE — Progress Notes (Signed)
Steven [name] is a 89 y.o. male here for a new problem.  History of Present Illness:   Chief Complaint  Patient presents with   Cough    Pt c/o cough since Sunday, non-productive, has been taking Mucinex.   HPI - here with caregiver, who provided some details; ambulating with wheelchair.   Cough: Complains of non-productive cough since Sunday. Cough has been slightly worsening per caregiver. Reports he's been using Mucinex and Tylenol.  Endorses decreased appetite. Notes his energy level is the same.  Covid and Flu tests done in office resulted NEG. Denies sore throat, facial pain, SOB, chest pain, diarrhea, or changes in urination.   Past Medical History:  Diagnosis Date   Abnormality of gait 08/23/2012   Anemia    hx of   Arthritis 01-03-12   osteoarthritis-knees   CAD (coronary artery disease) 2004   s/p inferior wall Mi 2004 with PTCA/Stent; s/p CABG 2004   Degenerative arthritis    Diabetes mellitus    type II   Foot drop, bilateral 08/23/2012   GERD (gastroesophageal reflux disease)    Heart disease    History of kidney stones    History of myocardial infarction    Hyperlipidemia    Myocardial infarction [facility]) 01-03-12   '04   MYOCARDIAL INFARCTION, HX OF 04/19/2007   Qualifier: Diagnosis of  By: Everett Graff     Neuropathy 01-03-12   bil. feet   Pneumonia    hx of   Polyneuropathy in diabetes(357.2) 08/23/2012   Radiculopathy of lumbar region 08/23/2011   Followed by Dr. Venetia Maxon. S/p surgery. Resolved after surgery.      Social History   Tobacco Use   Smoking status: Never   Smokeless tobacco: Never  Vaping Use   Vaping status: Never Used  Substance Use Topics   Alcohol use: No   Drug use: No   Past Surgical History:  Procedure Laterality Date   BACK SURGERY  2013   CARDIAC CATHETERIZATION  01-03-12   '04   CATARACT EXTRACTION Bilateral 2015   COLONOSCOPY     CORONARY ARTERY BYPASS GRAFT  2004   LIMA to LAD; SVG to ramus intermedius; SVG to PDA/PLSA    LAMINECTOMY  09/08/2011   Procedure: LUMBAR LAMINECTOMY FOR TUMOR;  Surgeon: Maeola Harman, MD;  Location: [facility];  Service: Neurosurgery;  Laterality: Left;  Left Thoracic twelve-Lumbar one transpedicular resection of epidural mass   TONSILLECTOMY  as child   TOTAL KNEE ARTHROPLASTY  01/15/2012   Procedure: TOTAL KNEE ARTHROPLASTY;  Surgeon: Loanne Drilling, MD;  Location: [facility];  Service: Orthopedics;  Laterality: Right;   TOTAL KNEE ARTHROPLASTY Left 09/16/2012   Procedure: LEFT TOTAL KNEE ARTHROPLASTY;  Surgeon: Loanne Drilling, MD;  Location: [facility];  Service: Orthopedics;  Laterality: Left;   Family History  Problem Relation Age of Onset   Cirrhosis Mother        etiology unclear   Hypertension Father    Stroke Father    Cancer Sister        breast   Stroke Maternal Grandmother    Heart disease Maternal Grandfather        MI   Stroke Paternal Grandmother    Stroke Paternal Grandfather    Anesthesia problems Neg Hx    Hypotension Neg Hx    Malignant hyperthermia Neg Hx    Pseudochol deficiency Neg Hx    Allergies  Allergen Reactions   Alendronate     reflux  Current Medications:   Current Outpatient Medications:    acetaminophen (TYLENOL) 500 MG tablet, Take 500 mg by mouth every 6 (six) hours as needed. Up to 6 tabs (3gm) qd, Disp: , Rfl:    aspirin 81 MG tablet, Take 81 mg by mouth daily., Disp: , Rfl:    atorvastatin (LIPITOR) 80 MG tablet, TAKE 1 TABLET DAILY., Disp: 90 tablet, Rfl: 3   blood glucose meter kit and supplies KIT, Dispense based on patient and insurance preference. Use up to four times daily as directed., Disp: 1 each, Rfl: 0   clotrimazole (CLOTRIMAZOLE AF) 1 % cream, Apply 1 Application topically 2 (two) times daily. Apply to irritated areas on penis and scrotum, groin., Disp: 30 g, Rfl: 1   gabapentin (NEURONTIN) 100 MG capsule, Take 1 capsule (100 mg total) by mouth at bedtime., Disp: 30 capsule, Rfl: 3   metFORMIN (GLUCOPHAGE) 1000 MG tablet,  TAKE 1 TABLET (1,000 MG TOTAL) BY MOUTH TWICE A DAY WITH FOOD, Disp: 180 tablet, Rfl: 3   metoprolol succinate (TOPROL-XL) 50 MG 24 hr tablet, TAKE 1 TABLET BY MOUTH DAILY. TAKE WITH OR IMMEDIATELY FOLLOWING A MEAL., Disp: 90 tablet, Rfl: 3   mupirocin ointment (BACTROBAN) 2 %, Apply 1 Application topically 2 (two) times daily. For 7 days. Do warm soak then dry thoroughly before putting this on, Disp: 22 g, Rfl: 0   fluconazole (DIFLUCAN) 150 MG tablet, Take 1 pill today and the 2nd pill in 3 days. (Patient not taking: Reported on 02/15/2023), Disp: 2 tablet, Rfl: 0  Review of Systems:   ROS See pertinent positives and negatives as per the HPI.  Vitals:   Vitals:   02/15/23 1116  BP: 124/70  Pulse: 61  Temp: 100 F (37.8 C)  TempSrc: Temporal  SpO2: 96%  Height: 5\' 9"  (1.753 m)     Body mass index is 22.15 kg/m.  Physical Exam:   Physical Exam Vitals and nursing note reviewed.  Constitutional:      General: He is not in acute distress.    Appearance: He is well-developed. He is not ill-appearing or toxic-appearing.  HENT:     Head: Normocephalic and atraumatic.     Right Ear: Tympanic membrane, ear canal and external ear normal. Tympanic membrane is not erythematous, retracted or bulging.     Left Ear: Tympanic membrane, ear canal and external ear normal. Tympanic membrane is not erythematous, retracted or bulging.     Nose: Nose normal.     Right Sinus: No maxillary sinus tenderness or frontal sinus tenderness.     Left Sinus: No maxillary sinus tenderness or frontal sinus tenderness.     Mouth/Throat:     Pharynx: Uvula midline. No posterior oropharyngeal erythema.  Eyes:     General: Lids are normal.     Conjunctiva/sclera: Conjunctivae normal.  Neck:     Trachea: Trachea normal.  Cardiovascular:     Rate and Rhythm: Normal rate and regular rhythm.     Pulses: Normal pulses.     Heart sounds: Normal heart sounds, S1 normal and S2 normal.  Pulmonary:     Effort:  Pulmonary effort is normal.     Breath sounds: Normal breath sounds. No decreased breath sounds, wheezing, rhonchi or rales.  Lymphadenopathy:     Cervical: No cervical adenopathy.  Skin:    General: Skin is warm and dry.  Neurological:     Mental Status: He is alert.     GCS: GCS eye subscore is 4.  GCS verbal subscore is 5. GCS motor subscore is 6.  Psychiatric:        Speech: Speech normal.        Behavior: Behavior normal. Behavior is cooperative.     Assessment and Plan:   Cough, unspecified type No red flags on exam.  Will initiate augmentin per orders. Discussed taking medications as prescribed. Reviewed return precautions including worsening fever, SOB, worsening cough or other concerns. Push fluids and rest. I recommend that patient follow-up if symptoms worsen or persist despite treatment x 7-10 days, sooner if needed.   I,Emily Lagle,acting as a Neurosurgeon for [employer], PA.,have documented all relevant documentation on the behalf of Jarold Motto, PA,as directed by  Jarold Motto, PA while in the presence of Jarold Motto, [place].  I, Jarold Motto, [place], have reviewed all documentation for this visit. The documentation on 02/15/23 for the exam, diagnosis, procedures, and orders are all accurate and complete.  Jarold Motto, PA-C

## 2023-02-15 NOTE — Patient Instructions (Signed)
It was great to see you!  Start Augmentin for early infection  Continue mucinex  Call if any questions/concerns  Take care,  Jarold Motto PA-C

## 2023-02-16 NOTE — Progress Notes (Signed)
1. No-show for appointment     

## 2023-03-02 ENCOUNTER — Ambulatory Visit: Payer: Medicare Other | Admitting: Family Medicine

## 2023-04-06 ENCOUNTER — Ambulatory Visit: Payer: Self-pay | Admitting: Family Medicine

## 2023-04-06 NOTE — Telephone Encounter (Signed)
Copied from CRM [id number]. Topic: Appointments - Appointment Scheduling >> Apr 06, 2023  8:14 AM Lennart Pall wrote: Patient/patient representative is calling to schedule an appointment. Refer to attachments for appointment information.   Chief Complaint: skin tear Symptoms: skin tear to left hand   Frequency: yesterday Pertinent Negatives: Patient denies active bleeding Disposition: [] ED /[] Urgent Care (no appt availability in office) / [] Appointment(In office/virtual)/ []  [place] [facility]/ [x] Home Care/ [] Refused Recommended Disposition /[] [place] [facility]/ []  Follow-up with PCP Additional Notes: pt's daughter called to obtain medical advice after pt tore skin after hitting hand on walker which produced a skin tear to left hand and now bruising has occurred. Nurse provided home care advice  Reason for Disposition  Minor cut or scratch  Answer Assessment - Initial Assessment Questions 1. APPEARANCE of INJURY: "What does the injury look like?"      Hit hand on walker and produced tear on top of hand and now bruised 2. SIZE: "How large is the cut?"      Large tear on hand and applied A&D ointment 3. BLEEDING: "Is it bleeding now?" If Yes, ask: "Is it difficult to stop?"      Wrapped in guaze - not bleeding now 4. LOCATION: "Where is the injury located?"      Left hand 5. ONSET: "How long ago did the injury occur?"      yesterday 6. MECHANISM: "Tell me how it happened."      Hit hand on walker 7. TETANUS: "When was the last tetanus booster?"     unknwon 8. PREGNANCY: "Is there any chance you are pregnant?" "When was your last menstrual period?"     no  Protocols used: Cuts and Lacerations-A-AH

## 2023-04-09 NOTE — Telephone Encounter (Signed)
Please schedule ov with available provider.

## 2023-04-14 ENCOUNTER — Other Ambulatory Visit: Payer: Self-pay

## 2023-04-14 ENCOUNTER — Emergency Department (HOSPITAL_COMMUNITY)
Admission: EM | Admit: 2023-04-14 | Discharge: 2023-04-14 | Disposition: A | Discharge status: Home / Self Care | Payer: Medicare Other | Attending: Student in an Organized Health Care Education/Training Program | Admitting: Student in an Organized Health Care Education/Training Program

## 2023-04-14 ENCOUNTER — Emergency Department (HOSPITAL_COMMUNITY): Payer: Medicare Other

## 2023-04-14 DIAGNOSIS — Z7984 Long term (current) use of oral hypoglycemic drugs: Secondary | ICD-10-CM | POA: Diagnosis not present

## 2023-04-14 DIAGNOSIS — R059 Cough, unspecified: Secondary | ICD-10-CM | POA: Diagnosis not present

## 2023-04-14 DIAGNOSIS — J101 Influenza due to other identified influenza virus with other respiratory manifestations: Secondary | ICD-10-CM | POA: Insufficient documentation

## 2023-04-14 DIAGNOSIS — R197 Diarrhea, unspecified: Secondary | ICD-10-CM | POA: Diagnosis not present

## 2023-04-14 DIAGNOSIS — Z7401 Bed confinement status: Secondary | ICD-10-CM | POA: Diagnosis not present

## 2023-04-14 DIAGNOSIS — Z20822 Contact with and (suspected) exposure to covid-19: Secondary | ICD-10-CM | POA: Insufficient documentation

## 2023-04-14 DIAGNOSIS — Z743 Need for continuous supervision: Secondary | ICD-10-CM | POA: Diagnosis not present

## 2023-04-14 DIAGNOSIS — Z7982 Long term (current) use of aspirin: Secondary | ICD-10-CM | POA: Insufficient documentation

## 2023-04-14 DIAGNOSIS — F0390 Unspecified dementia without behavioral disturbance: Secondary | ICD-10-CM | POA: Insufficient documentation

## 2023-04-14 DIAGNOSIS — E119 Type 2 diabetes mellitus without complications: Secondary | ICD-10-CM | POA: Diagnosis not present

## 2023-04-14 DIAGNOSIS — F03C18 Unspecified dementia, severe, with other behavioral disturbance: Secondary | ICD-10-CM | POA: Diagnosis not present

## 2023-04-14 DIAGNOSIS — R9431 Abnormal electrocardiogram [ECG] [EKG]: Secondary | ICD-10-CM | POA: Diagnosis not present

## 2023-04-14 DIAGNOSIS — R531 Weakness: Secondary | ICD-10-CM | POA: Diagnosis not present

## 2023-04-14 LAB — URINALYSIS, ROUTINE W REFLEX MICROSCOPIC
Bilirubin Urine: NEGATIVE
Glucose, UA: NEGATIVE mg/dL
Ketones, ur: NEGATIVE mg/dL
Leukocytes,Ua: NEGATIVE
Nitrite: NEGATIVE
Protein, ur: NEGATIVE mg/dL
Specific Gravity, Urine: 1.014 (ref 1.005–1.030)
pH: 5 (ref 5.0–8.0)

## 2023-04-14 LAB — CBC WITH DIFFERENTIAL/PLATELET
Abs Immature Granulocytes: 0.02 10*3/uL (ref 0.00–0.07)
Basophils Absolute: 0 10*3/uL (ref 0.0–0.1)
Basophils Relative: 1 %
Eosinophils Absolute: 0.1 10*3/uL (ref 0.0–0.5)
Eosinophils Relative: 1 %
HCT: 37.9 % — ABNORMAL LOW (ref 39.0–52.0)
Hemoglobin: 12.3 g/dL — ABNORMAL LOW (ref 13.0–17.0)
Immature Granulocytes: 0 %
Lymphocytes Relative: 16 %
Lymphs Abs: 1.1 10*3/uL (ref 0.7–4.0)
MCH: 30.8 pg (ref 26.0–34.0)
MCHC: 32.5 g/dL (ref 30.0–36.0)
MCV: 95 fL (ref 80.0–100.0)
Monocytes Absolute: 0.7 10*3/uL (ref 0.1–1.0)
Monocytes Relative: 10 %
Neutro Abs: 5.2 10*3/uL (ref 1.7–7.7)
Neutrophils Relative %: 72 %
Platelets: 213 10*3/uL (ref 150–400)
RBC: 3.99 MIL/uL — ABNORMAL LOW (ref 4.22–5.81)
RDW: 13.5 % (ref 11.5–15.5)
WBC: 7.2 10*3/uL (ref 4.0–10.5)
nRBC: 0 % (ref 0.0–0.2)

## 2023-04-14 LAB — RESP PANEL BY RT-PCR (RSV, FLU A&B, COVID)  RVPGX2
Influenza A by PCR: POSITIVE — AB
Influenza B by PCR: NEGATIVE
Resp Syncytial Virus by PCR: NEGATIVE
SARS Coronavirus 2 by RT PCR: NEGATIVE

## 2023-04-14 LAB — COMPREHENSIVE METABOLIC PANEL
ALT: 10 U/L (ref 0–44)
AST: 14 U/L — ABNORMAL LOW (ref 15–41)
Albumin: 3.4 g/dL — ABNORMAL LOW (ref 3.5–5.0)
Alkaline Phosphatase: 70 U/L (ref 38–126)
Anion gap: 9 (ref 5–15)
BUN: 19 mg/dL (ref 8–23)
CO2: 22 mmol/L (ref 22–32)
Calcium: 9 mg/dL (ref 8.9–10.3)
Chloride: 103 mmol/L (ref 98–111)
Creatinine, Ser: 0.92 mg/dL (ref 0.61–1.24)
GFR, Estimated: >60 mL/min (ref >60)
Glucose, Bld: 135 mg/dL — ABNORMAL HIGH (ref 70–99)
Potassium: 3.8 mmol/L (ref 3.5–5.1)
Sodium: 134 mmol/L — ABNORMAL LOW (ref 135–145)
Total Bilirubin: 0.6 mg/dL (ref <1.2)
Total Protein: 7 g/dL (ref 6.5–8.1)

## 2023-04-14 MED ORDER — OSELTAMIVIR PHOSPHATE 75 MG PO CAPS: 75.0000 mg | ORAL_CAPSULE | Freq: Two times a day (BID) | ORAL | 0 refills | Status: DC

## 2023-04-14 NOTE — ED Notes (Signed)
Called [facility] for transport.  

## 2023-04-14 NOTE — ED Provider Notes (Signed)
[place] EMERGENCY DEPARTMENT AT [facility] Provider Note   CSN: [id number] Arrival date & time: 04/14/23  0941     History  Chief Complaint  Patient presents with   Cough   Diarrhea    Steven [name] is a 89 y.o. male.  89 year old male with a past medical history of dementia and type 2 diabetes brought in by his home caretaker due to a cough x 1 day.  The caretaker states that the patient's wife was diagnosed with pneumonia approximately 1 month ago and another family member who came into town to help care for them was also recently admitted with the flu.  He has multiple caretakers at home and home nurses.  They noticed a cough that began last night.  The cough is described as a dry cough, however they deny any obvious shortness of breath and the patient denies any shortness of breath.  History is limited secondary to his dementia, however he denies any current pain right now in the ED.  He also denies any chest pain, abdominal pain, or headache.  Caretaker denies any known fevers.  They report that he has been more fatigued this morning and did not eat as much breakfast.  The caretaker at bedside is unable to tell me which medications he is taking since she does not provide his daily medications.  She denies any known recent hospitalizations or recent illnesses.  The history is provided by a caregiver.  Cough Diarrhea      Home Medications Prior to Admission medications   Medication Sig Start Date End Date Taking? Authorizing Provider  oseltamivir (TAMIFLU) 75 MG capsule Take 1 capsule (75 mg total) by mouth every 12 (twelve) hours. 04/14/23  Yes Pranish Akhavan, DO  acetaminophen (TYLENOL) 500 MG tablet Take 500 mg by mouth every 6 (six) hours as needed. Up to 6 tabs (3gm) qd    Anselm Lis, NP  amoxicillin-clavulanate (AUGMENTIN) 875-125 MG tablet Take 1 tablet by mouth 2 (two) times daily. 02/15/23   Jarold Motto, PA  aspirin 81 MG tablet Take 81 mg by mouth  daily.    [provider]  atorvastatin (LIPITOR) 80 MG tablet TAKE 1 TABLET DAILY. 08/30/21   Shelva Majestic, MD  blood glucose meter kit and supplies KIT Dispense based on patient and insurance preference. Use up to four times daily as directed. 03/31/22   Shelva Majestic, MD  clotrimazole (CLOTRIMAZOLE AF) 1 % cream Apply 1 Application topically 2 (two) times daily. Apply to irritated areas on penis and scrotum, groin. 12/25/22   Dulce Sellar, NP  fluconazole (DIFLUCAN) 150 MG tablet Take 1 pill today and the 2nd pill in 3 days. Patient not taking: Reported on 02/15/2023 12/25/22   Dulce Sellar, NP  gabapentin (NEURONTIN) 100 MG capsule Take 1 capsule (100 mg total) by mouth at bedtime. 11/22/21   Rodolph Bong, MD  metFORMIN (GLUCOPHAGE) 1000 MG tablet TAKE 1 TABLET (1,000 MG TOTAL) BY MOUTH TWICE A DAY WITH FOOD 08/24/22   Shelva Majestic, MD  metoprolol succinate (TOPROL-XL) 50 MG 24 hr tablet TAKE 1 TABLET BY MOUTH DAILY. TAKE WITH OR IMMEDIATELY FOLLOWING A MEAL. 10/16/22   Shelva Majestic, MD  mupirocin ointment (BACTROBAN) 2 % Apply 1 Application topically 2 (two) times daily. For 7 days. Do warm soak then dry thoroughly before putting this on 08/30/22   Shelva Majestic, MD  testosterone cypionate (DEPO-TESTOSTERONE) 200 MG/ML injection Inject 1 mL (200 mg  total) into the muscle every 30 (thirty) days. 12/09/10 01/27/11  Swords, Valetta Mole, MD      Allergies    Alendronate    Review of Systems   Review of Systems  Respiratory:  Positive for cough.   Gastrointestinal:  Positive for diarrhea.  All other systems reviewed and are negative.   Physical Exam Updated Vital Signs BP (!) 142/101   Pulse 77   Temp 98.4 F (36.9 C)   Resp 16   Ht 5\' 9"  (1.753 m)   Wt 68 kg   SpO2 100%   BMI 22.14 kg/m  Physical Exam Constitutional:      Comments: Elderly   HENT:     Head: Normocephalic and atraumatic.     Nose: Nose normal.     Mouth/Throat:     Mouth: Mucous  membranes are moist.  Eyes:     Conjunctiva/sclera: Conjunctivae normal.  Cardiovascular:     Rate and Rhythm: Normal rate and regular rhythm.     Pulses: Normal pulses.  Pulmonary:     Effort: Pulmonary effort is normal. No respiratory distress.     Breath sounds: No wheezing.  Abdominal:     General: Abdomen is flat.     Palpations: Abdomen is soft.  Musculoskeletal:     Comments: Limited due to age  Skin:    General: Skin is warm.     Capillary Refill: Capillary refill takes less than 2 seconds.     Comments: Chronic skin tears  Neurological:     Mental Status: He is alert. Mental status is at baseline.     ED Results / Procedures / Treatments   Labs (all labs ordered are listed, but only abnormal results are displayed) Labs Reviewed  RESP PANEL BY RT-PCR (RSV, FLU A&B, COVID)  RVPGX2 - Abnormal; Notable for the following components:      Result Value   Influenza A by PCR POSITIVE (*)    All other components within normal limits  CBC WITH DIFFERENTIAL/PLATELET - Abnormal; Notable for the following components:   RBC 3.99 (*)    Hemoglobin 12.3 (*)    HCT 37.9 (*)    All other components within normal limits  COMPREHENSIVE METABOLIC PANEL - Abnormal; Notable for the following components:   Sodium 134 (*)    Glucose, Bld 135 (*)    Albumin 3.4 (*)    AST 14 (*)    All other components within normal limits  URINALYSIS, ROUTINE W REFLEX MICROSCOPIC - Abnormal; Notable for the following components:   Hgb urine dipstick SMALL (*)    Bacteria, UA RARE (*)    All other components within normal limits    EKG None  Radiology DG Chest Portable 1 View Result Date: 04/14/2023 CLINICAL DATA:  Cough Dementia Diarrhea for 2 days EXAM: PORTABLE CHEST - 1 VIEW COMPARISON:  09/03/2012 FINDINGS: Cardiomediastinal silhouette and pulmonary vasculature are within normal limits. Lungs are clear. Evaluation is limited due to expiratory phase of imaging. Postsurgical changes of CABG are  noted. IMPRESSION: No acute cardiopulmonary process. Electronically Signed   By: Acquanetta Belling M.D.   On: 04/14/2023 10:38    Procedures Procedures    Medications Ordered in ED Medications - No data to display  ED Course/ Medical Decision Making/ A&P Clinical Course as of 04/14/23 1802  Sat Apr 14, 2023  1052 CXR shows no pneumonia  [AL]  1117 WBC count WNL [AL]  1123 CMP normal [AL]  1123 Influenza positive [AL]  23 Spoke with pt's daughter - concerned for UTI [AL]    Clinical Course User Index [AL] Shephanie Romas, DO                                 Medical Decision Making Differential includes viral URI, pneumonia, congestive heart failure, COPD, and others 89 year old male presenting with a cough x 1 day.  History limited secondary to the patient's dementia.  Caretaker states he is appeared more fatigued along with his cough.  We plan to get basic labs, EKG, and chest x-ray.  The caretaker will speak with the in-home nurse to discuss his ability to manage at home with his cough. EKG-my interpretation: cluttered baseline strip but P waves seen. Sinus rhythm, normal intervals, no STEMI CXR unremarkable  Spoke extensively with the patient's daughter-she reports that the patient was unable to walk yesterday which is not his baseline.  They are concerned about his fatigue and being able to take care of him at home.  She is also concerned about underlying UTI.  Daughter requesting admission as there is no one at home to care for him.  Will plan on speaking with the hospitalist.  It was agreed that the patient would likely do course in the hospital with his baseline dementia.  He is already asking to be discharged home and caretakers made it clear that he will not handle an IV as he typically pulls them out.  There is concern that he will decline with his dementia and medically we can treat him with Tamiflu at home.  The caretaker at bedside agrees with this plan.  Attempted to call the  daughter to update her, however there is no answer.  Plan to check a urine and discharge home.   Amount and/or Complexity of Data Reviewed Labs: ordered. Radiology: ordered.  Risk Prescription drug management.    Final Clinical Impression(s) / ED Diagnoses Final diagnoses:  Influenza A    Rx / DC Orders ED Discharge Orders          Ordered    oseltamivir (TAMIFLU) 75 MG capsule  Every 12 hours        04/14/23 1759              Celestine Bougie, DO 04/14/23 1802

## 2023-04-14 NOTE — ED Triage Notes (Signed)
Pt from home via ems, coughing and diarrhea for 2 days, hx dementia daughter and caregiver care at home.

## 2023-04-14 NOTE — Discharge Instructions (Addendum)
Steven [name] was seen in the emergency department today due to your cough and fatigue.  He was found to have influenza A.  We have sent Tamiflu to the pharmacy to take over the next 5 days. Please follow-up with your primary care physician within the next few days for reevaluation.  Return if you develop any chest pain, shortness of breath, changes in behavior, or you have any further concerns.

## 2023-04-14 NOTE — Consult Note (Signed)
Initial Consultation Note   Patient: Steven [name] WNU:[id number] DOB: 1930/08/20 PCP: Shelva Majestic, MD DOA: 04/14/2023 DOS: the patient was seen and examined on 04/14/2023 Primary service: Samantha Crimes, DO  Referring physician: Samantha Crimes Reason for consult: Influenza and generalized weakness  Assessment/Plan: Influenza A infection: Patient presents with cough.  No other respiratory distress.  Saturating at 100% on RA.  CXR without difficult finding.  Basic labs including CMP and CBC with differential reassuring.  Patient appears well but confused and gets agitated when asked questions. -Given severe dementia, at risk for severe delirium, overmedication and restraints which would pose more harm if admitted.  Discussed this with EDP and caregiver.  Okay to eat or drink from our standpoint. -Can consider Tamiflu for 5 days.  -Supportive care with oral hydration, mucolytics and antitussive  Severe dementia with behavioral disturbance: Likely due to influenza infection. -Patient would thrive more at the family environment with familiar faces.  Hospital admission was supposed more harm due to the above. -Okay to eat or drink from our standpoint.  Other chronic medical conditions including CAD, HTN, diabetes, AAA and HLD stable.    TRH will sign off at present, please call us again when needed.  HPI: Steven [name] is a 89 y.o. male with PMH of severe dementia and NIDDM-2 brought to ED by EMS due to dry cough and diarrhea.   Patient with severe dementia.  Not able to answer orientation questions.  History provided by caregiver at bedside.  Well-known to caregiver.  She reports that he had cough all night.  As a result, he was sleepier, weaker and his dementia gotten worse today.  He has no fever, shortness of breath, nausea or vomiting.  No complaints of UTI symptoms.  No bladder habit change either.  Sick contact with multiple family members with influenza infection.  His wife recently  diagnosed with influenza and had complication including CHF exacerbation, and now in hospice.  Per caregiver, patient normally ambulates using rolling walker with supervision.  Patient feels thirsty and asking for water.  In ED, stable vitals.  Saturating at 100% on room air.  BMP and CBC without significant finding.  Influenza A PCR positive.  CXR without acute finding.  Patient was noncooperative when staff tried to place IV.  Consultation requested due to daughter's concerns about patient's cough, weakness and inability to attend to him while looking after her mother.  Review of Systems: Unable to review all systems due to lack of cooperation from patient. Past Medical History:  Diagnosis Date   Abnormality of gait 08/23/2012   Anemia    hx of   Arthritis 01-03-12   osteoarthritis-knees   CAD (coronary artery disease) 2004   s/p inferior wall Mi 2004 with PTCA/Stent; s/p CABG 2004   Degenerative arthritis    Diabetes mellitus    type II   Foot drop, bilateral 08/23/2012   GERD (gastroesophageal reflux disease)    Heart disease    History of kidney stones    History of myocardial infarction    Hyperlipidemia    Myocardial infarction [facility]) 01-03-12   '04   MYOCARDIAL INFARCTION, HX OF 04/19/2007   Qualifier: Diagnosis of  By: Everett Graff     Neuropathy 01-03-12   bil. feet   Pneumonia    hx of   Polyneuropathy in diabetes(357.2) 08/23/2012   Radiculopathy of lumbar region 08/23/2011   Followed by Dr. Venetia Maxon. S/p surgery. Resolved after surgery.  Past Surgical History:  Procedure Laterality Date   BACK SURGERY  2013   CARDIAC CATHETERIZATION  01-03-12   '04   CATARACT EXTRACTION Bilateral 2015   COLONOSCOPY     CORONARY ARTERY BYPASS GRAFT  2004   LIMA to LAD; SVG to ramus intermedius; SVG to PDA/PLSA   LAMINECTOMY  09/08/2011   Procedure: LUMBAR LAMINECTOMY FOR TUMOR;  Surgeon: Maeola Harman, MD;  Location: [facility];  Service: Neurosurgery;  Laterality: Left;  Left Thoracic  twelve-Lumbar one transpedicular resection of epidural mass   TONSILLECTOMY  as child   TOTAL KNEE ARTHROPLASTY  01/15/2012   Procedure: TOTAL KNEE ARTHROPLASTY;  Surgeon: Loanne Drilling, MD;  Location: [facility];  Service: Orthopedics;  Laterality: Right;   TOTAL KNEE ARTHROPLASTY Left 09/16/2012   Procedure: LEFT TOTAL KNEE ARTHROPLASTY;  Surgeon: Loanne Drilling, MD;  Location: [facility];  Service: Orthopedics;  Laterality: Left;   Social History:  reports that he has never smoked. He has never used smokeless tobacco. He reports that he does not drink alcohol and does not use drugs.  Allergies  Allergen Reactions   Alendronate     reflux    Family History  Problem Relation Age of Onset   Cirrhosis Mother        etiology unclear   Hypertension Father    Stroke Father    Cancer Sister        breast   Stroke Maternal Grandmother    Heart disease Maternal Grandfather        MI   Stroke Paternal Grandmother    Stroke Paternal Grandfather    Anesthesia problems Neg Hx    Hypotension Neg Hx    Malignant hyperthermia Neg Hx    Pseudochol deficiency Neg Hx     Prior to Admission medications   Medication Sig Start Date End Date Taking? Authorizing Provider  acetaminophen (TYLENOL) 500 MG tablet Take 500 mg by mouth every 6 (six) hours as needed. Up to 6 tabs (3gm) qd    Anselm Lis, NP  amoxicillin-clavulanate (AUGMENTIN) 875-125 MG tablet Take 1 tablet by mouth 2 (two) times daily. 02/15/23   Jarold Motto, PA  aspirin 81 MG tablet Take 81 mg by mouth daily.    [provider]  atorvastatin (LIPITOR) 80 MG tablet TAKE 1 TABLET DAILY. 08/30/21   Shelva Majestic, MD  blood glucose meter kit and supplies KIT Dispense based on patient and insurance preference. Use up to four times daily as directed. 03/31/22   Shelva Majestic, MD  clotrimazole (CLOTRIMAZOLE AF) 1 % cream Apply 1 Application topically 2 (two) times daily. Apply to irritated areas on penis and scrotum, groin.  12/25/22   Dulce Sellar, NP  fluconazole (DIFLUCAN) 150 MG tablet Take 1 pill today and the 2nd pill in 3 days. Patient not taking: Reported on 02/15/2023 12/25/22   Dulce Sellar, NP  gabapentin (NEURONTIN) 100 MG capsule Take 1 capsule (100 mg total) by mouth at bedtime. 11/22/21   Rodolph Bong, MD  metFORMIN (GLUCOPHAGE) 1000 MG tablet TAKE 1 TABLET (1,000 MG TOTAL) BY MOUTH TWICE A DAY WITH FOOD 08/24/22   Shelva Majestic, MD  metoprolol succinate (TOPROL-XL) 50 MG 24 hr tablet TAKE 1 TABLET BY MOUTH DAILY. TAKE WITH OR IMMEDIATELY FOLLOWING A MEAL. 10/16/22   Shelva Majestic, MD  mupirocin ointment (BACTROBAN) 2 % Apply 1 Application topically 2 (two) times daily. For 7 days. Do warm soak then dry thoroughly before  putting this on 08/30/22   Shelva Majestic, MD  testosterone cypionate (DEPO-TESTOSTERONE) 200 MG/ML injection Inject 1 mL (200 mg total) into the muscle every 30 (thirty) days. 12/09/10 01/27/11  Lindley Magnus, MD    Physical Exam: Vitals:   04/14/23 1015 04/14/23 1030 04/14/23 1100 04/14/23 1105  BP: (!) 141/91 (!) 160/71 (!) 144/71 (!) 142/99  Pulse: 65 65 74 65  Resp: 18 (!) 22 (!) 27 (!) 22  Temp:    (!) 97.4 F (36.3 C)  TempSrc:    Oral  SpO2: 98% 100% 97% 100%  Weight:      Height:       GENERAL: No apparent distress.  Nontoxic. HEENT: MMM.  Vision and hearing grossly intact.  NECK: Supple.  No apparent JVD.  RESP:  No IWOB.  Fair aeration bilaterally. CVS:  RRR. Heart sounds normal.  ABD/GI/GU: BS+. Abd soft, NTND.  MSK/EXT:   No apparent deformity. Moves extremities. No edema.  SKIN: no apparent skin lesion or wound NEURO: Awake and alert.  Not able to answer orientation questions.  Does not follow commands. PSYCH: Confused.  Agitated at times.  Data Reviewed:  Patient reports no  vision/ hearing changes,anorexia, weight change, fever ,adenopathy, persistant / recurrent hoarseness, swallowing issues, chest pain, edema,persistant / recurrent cough,  hemoptysis, dyspnea(rest, exertional, paroxysmal nocturnal), gastrointestinal  bleeding (melena, rectal bleeding), abdominal pain, excessive heart burn, GU symptoms(dysuria, hematuria, pyuria, voiding/incontinence  Issues) syncope, focal weakness, severe memory loss, concerning skin lesions, depression, anxiety, abnormal bruising/bleeding, major joint swelling.    Family Communication: Updated patient's caregiver at bedside Primary team communication: Updated EDP Thank you very much for involving [place] in the care of your patient.  Author: Almon Hercules, MD 04/14/2023 12:51 PM  For on call review [email or web address].

## 2023-04-15 ENCOUNTER — Inpatient Hospital Stay (HOSPITAL_COMMUNITY)
Admission: EM | Admit: 2023-04-15 | Discharge: 2023-04-24 | DRG: 195 | Disposition: A | Discharge status: Skilled Nursing Facility | Payer: Medicare Other | Attending: Internal Medicine | Admitting: Internal Medicine

## 2023-04-15 ENCOUNTER — Emergency Department (HOSPITAL_COMMUNITY): Payer: Medicare Other

## 2023-04-15 DIAGNOSIS — J101 Influenza due to other identified influenza virus with other respiratory manifestations: Principal | ICD-10-CM | POA: Diagnosis present

## 2023-04-15 DIAGNOSIS — I252 Old myocardial infarction: Secondary | ICD-10-CM

## 2023-04-15 DIAGNOSIS — Z79899 Other long term (current) drug therapy: Secondary | ICD-10-CM | POA: Diagnosis not present

## 2023-04-15 DIAGNOSIS — Z6821 Body mass index (BMI) 21.0-21.9, adult: Secondary | ICD-10-CM

## 2023-04-15 DIAGNOSIS — D696 Thrombocytopenia, unspecified: Secondary | ICD-10-CM

## 2023-04-15 DIAGNOSIS — Z7984 Long term (current) use of oral hypoglycemic drugs: Secondary | ICD-10-CM | POA: Diagnosis not present

## 2023-04-15 DIAGNOSIS — M21371 Foot drop, right foot: Secondary | ICD-10-CM | POA: Diagnosis present

## 2023-04-15 DIAGNOSIS — Z955 Presence of coronary angioplasty implant and graft: Secondary | ICD-10-CM | POA: Diagnosis not present

## 2023-04-15 DIAGNOSIS — Z888 Allergy status to other drugs, medicaments and biological substances status: Secondary | ICD-10-CM

## 2023-04-15 DIAGNOSIS — Z7982 Long term (current) use of aspirin: Secondary | ICD-10-CM

## 2023-04-15 DIAGNOSIS — Z823 Family history of stroke: Secondary | ICD-10-CM

## 2023-04-15 DIAGNOSIS — Z9841 Cataract extraction status, right eye: Secondary | ICD-10-CM | POA: Diagnosis not present

## 2023-04-15 DIAGNOSIS — Z9842 Cataract extraction status, left eye: Secondary | ICD-10-CM | POA: Diagnosis not present

## 2023-04-15 DIAGNOSIS — J111 Influenza due to unidentified influenza virus with other respiratory manifestations: Secondary | ICD-10-CM | POA: Diagnosis not present

## 2023-04-15 DIAGNOSIS — Z951 Presence of aortocoronary bypass graft: Secondary | ICD-10-CM | POA: Diagnosis not present

## 2023-04-15 DIAGNOSIS — E1142 Type 2 diabetes mellitus with diabetic polyneuropathy: Secondary | ICD-10-CM | POA: Diagnosis present

## 2023-04-15 DIAGNOSIS — Z8249 Family history of ischemic heart disease and other diseases of the circulatory system: Secondary | ICD-10-CM | POA: Diagnosis not present

## 2023-04-15 DIAGNOSIS — R400 Somnolence: Secondary | ICD-10-CM

## 2023-04-15 DIAGNOSIS — D6959 Other secondary thrombocytopenia: Secondary | ICD-10-CM | POA: Diagnosis not present

## 2023-04-15 DIAGNOSIS — R0989 Other specified symptoms and signs involving the circulatory and respiratory systems: Secondary | ICD-10-CM | POA: Diagnosis not present

## 2023-04-15 DIAGNOSIS — Z96653 Presence of artificial knee joint, bilateral: Secondary | ICD-10-CM | POA: Diagnosis present

## 2023-04-15 DIAGNOSIS — R0602 Shortness of breath: Secondary | ICD-10-CM | POA: Diagnosis not present

## 2023-04-15 DIAGNOSIS — I10 Essential (primary) hypertension: Secondary | ICD-10-CM | POA: Diagnosis present

## 2023-04-15 DIAGNOSIS — R404 Transient alteration of awareness: Secondary | ICD-10-CM | POA: Diagnosis not present

## 2023-04-15 DIAGNOSIS — R627 Adult failure to thrive: Secondary | ICD-10-CM | POA: Diagnosis present

## 2023-04-15 DIAGNOSIS — I2510 Atherosclerotic heart disease of native coronary artery without angina pectoris: Secondary | ICD-10-CM | POA: Diagnosis not present

## 2023-04-15 DIAGNOSIS — Z743 Need for continuous supervision: Secondary | ICD-10-CM | POA: Diagnosis not present

## 2023-04-15 DIAGNOSIS — D649 Anemia, unspecified: Secondary | ICD-10-CM | POA: Diagnosis present

## 2023-04-15 DIAGNOSIS — F0390 Unspecified dementia without behavioral disturbance: Secondary | ICD-10-CM | POA: Diagnosis present

## 2023-04-15 DIAGNOSIS — R531 Weakness: Secondary | ICD-10-CM

## 2023-04-15 DIAGNOSIS — Z87442 Personal history of urinary calculi: Secondary | ICD-10-CM

## 2023-04-15 DIAGNOSIS — I499 Cardiac arrhythmia, unspecified: Secondary | ICD-10-CM | POA: Diagnosis not present

## 2023-04-15 DIAGNOSIS — L89152 Pressure ulcer of sacral region, stage 2: Secondary | ICD-10-CM | POA: Diagnosis not present

## 2023-04-15 DIAGNOSIS — E785 Hyperlipidemia, unspecified: Secondary | ICD-10-CM | POA: Diagnosis not present

## 2023-04-15 DIAGNOSIS — M21372 Foot drop, left foot: Secondary | ICD-10-CM | POA: Diagnosis present

## 2023-04-15 DIAGNOSIS — R6889 Other general symptoms and signs: Secondary | ICD-10-CM | POA: Diagnosis not present

## 2023-04-15 DIAGNOSIS — K219 Gastro-esophageal reflux disease without esophagitis: Secondary | ICD-10-CM | POA: Diagnosis not present

## 2023-04-15 DIAGNOSIS — R4182 Altered mental status, unspecified: Secondary | ICD-10-CM | POA: Diagnosis not present

## 2023-04-15 DIAGNOSIS — Z803 Family history of malignant neoplasm of breast: Secondary | ICD-10-CM

## 2023-04-15 LAB — COMPREHENSIVE METABOLIC PANEL
ALT: 12 U/L (ref 0–44)
AST: 25 U/L (ref 15–41)
Albumin: 3 g/dL — ABNORMAL LOW (ref 3.5–5.0)
Alkaline Phosphatase: 55 U/L (ref 38–126)
Anion gap: 13 (ref 5–15)
BUN: 18 mg/dL (ref 8–23)
CO2: 17 mmol/L — ABNORMAL LOW (ref 22–32)
Calcium: 8 mg/dL — ABNORMAL LOW (ref 8.9–10.3)
Chloride: 105 mmol/L (ref 98–111)
Creatinine, Ser: 0.9 mg/dL (ref 0.61–1.24)
GFR, Estimated: >60 mL/min (ref >60)
Glucose, Bld: 153 mg/dL — ABNORMAL HIGH (ref 70–99)
Potassium: 3.8 mmol/L (ref 3.5–5.1)
Sodium: 135 mmol/L (ref 135–145)
Total Bilirubin: 0.7 mg/dL (ref <1.2)
Total Protein: 6.6 g/dL (ref 6.5–8.1)

## 2023-04-15 LAB — CBC WITH DIFFERENTIAL/PLATELET
Abs Immature Granulocytes: 0.03 10*3/uL (ref 0.00–0.07)
Basophils Absolute: 0 10*3/uL (ref 0.0–0.1)
Basophils Relative: 0 %
Eosinophils Absolute: 0 10*3/uL (ref 0.0–0.5)
Eosinophils Relative: 0 %
HCT: 29.8 % — ABNORMAL LOW (ref 39.0–52.0)
Hemoglobin: 9.7 g/dL — ABNORMAL LOW (ref 13.0–17.0)
Immature Granulocytes: 0 %
Lymphocytes Relative: 7 %
Lymphs Abs: 0.7 10*3/uL (ref 0.7–4.0)
MCH: 31.3 pg (ref 26.0–34.0)
MCHC: 32.6 g/dL (ref 30.0–36.0)
MCV: 96.1 fL (ref 80.0–100.0)
Monocytes Absolute: 0.9 10*3/uL (ref 0.1–1.0)
Monocytes Relative: 9 %
Neutro Abs: 8.2 10*3/uL — ABNORMAL HIGH (ref 1.7–7.7)
Neutrophils Relative %: 84 %
Platelets: 140 10*3/uL — ABNORMAL LOW (ref 150–400)
RBC: 3.1 MIL/uL — ABNORMAL LOW (ref 4.22–5.81)
RDW: 13.9 % (ref 11.5–15.5)
WBC: 9.8 10*3/uL (ref 4.0–10.5)
nRBC: 0 % (ref 0.0–0.2)

## 2023-04-15 LAB — POC OCCULT BLOOD, ED: Fecal Occult Bld: NEGATIVE

## 2023-04-15 MED ORDER — ACETAMINOPHEN 325 MG PO TABS
650.0000 mg | ORAL_TABLET | Freq: Four times a day (QID) | ORAL | Status: DC | PRN
Start: 2023-04-15 — End: 2023-04-24
  Administered 2023-04-15 – 2023-04-24 (×8): 650 mg via ORAL
  Filled 2023-04-15 (×11): qty 2

## 2023-04-15 MED ORDER — ENOXAPARIN SODIUM 40 MG/0.4ML IJ SOSY: 40.0000 mg | PREFILLED_SYRINGE | INTRAMUSCULAR | Status: DC

## 2023-04-15 MED ORDER — LORAZEPAM 2 MG/ML IJ SOLN
0.2500 mg | Freq: Four times a day (QID) | INTRAMUSCULAR | Status: AC
  Administered 2023-04-15: 0.25 mg via INTRAVENOUS
  Filled 2023-04-15: qty 1

## 2023-04-15 MED ORDER — ONDANSETRON HCL 4 MG/2ML IJ SOLN: 4.0000 mg | Freq: Four times a day (QID) | INTRAMUSCULAR | Status: DC | PRN

## 2023-04-15 MED ORDER — ASPIRIN 81 MG PO TBEC
81.0000 mg | DELAYED_RELEASE_TABLET | Freq: Every day | ORAL | Status: DC
  Administered 2023-04-15 – 2023-04-24 (×10): 81 mg via ORAL
  Filled 2023-04-15 (×8): qty 1

## 2023-04-15 MED ORDER — ACETAMINOPHEN 650 MG RE SUPP: 650.0000 mg | Freq: Four times a day (QID) | RECTAL | Status: DC | PRN

## 2023-04-15 MED ORDER — METOPROLOL SUCCINATE ER 50 MG PO TB24
50.0000 mg | ORAL_TABLET | Freq: Every day | ORAL | Status: DC
  Administered 2023-04-16 – 2023-04-24 (×9): 50 mg via ORAL
  Filled 2023-04-15 (×9): qty 1

## 2023-04-15 MED ORDER — ENOXAPARIN SODIUM 40 MG/0.4ML IJ SOSY
40.0000 mg | PREFILLED_SYRINGE | Freq: Every day | INTRAMUSCULAR | Status: DC
  Administered 2023-04-15 – 2023-04-23 (×9): 40 mg via SUBCUTANEOUS
  Filled 2023-04-15 (×10): qty 0.4

## 2023-04-15 MED ORDER — ATORVASTATIN CALCIUM 40 MG PO TABS: 80.0000 mg | ORAL_TABLET | Freq: Every day | ORAL | Status: DC

## 2023-04-15 MED ORDER — ONDANSETRON HCL 4 MG PO TABS
4.0000 mg | ORAL_TABLET | Freq: Four times a day (QID) | ORAL | Status: DC | PRN
Start: 2023-04-15 — End: 2023-04-24

## 2023-04-15 MED ORDER — OSELTAMIVIR PHOSPHATE 30 MG PO CAPS
30.0000 mg | ORAL_CAPSULE | Freq: Two times a day (BID) | ORAL | Status: AC
  Administered 2023-04-15 – 2023-04-20 (×10): 30 mg via ORAL
  Filled 2023-04-15 (×10): qty 1

## 2023-04-15 NOTE — ED Provider Notes (Signed)
[place] EMERGENCY DEPARTMENT AT [facility] Provider Note   CSN: [id number] Arrival date & time: 04/15/23  1524     History  Chief Complaint  Patient presents with   Altered Mental Status    Steven [name] is a 89 y.o. male.  Patient is a 89 year old male with a PMH of dementia, hypertension, diabetes, CAD, GERD presenting to the emergency department with an episode of unresponsiveness.  The patient was seen in the ED yesterday and was diagnosed with the flu.  He was discharged home on Tamiflu.  Per EMS his caretaker called 911 because he was found to be unresponsive today.  Patient's history is limited due to his dementia.  I attempted to call his daughter who I am not been able to get hold of.  According to EMS patient's wife was also brought to the ED for feeling ill.  Per EMS patient has not hypoxic but placed on 2L [place] for comfort in route. No family or caretakers are at bedside  The history is provided by the EMS personnel. History limited by: Level 5 caveat for dementia.  Altered Mental Status      Home Medications Prior to Admission medications   Medication Sig Start Date End Date Taking? Authorizing Provider  acetaminophen (TYLENOL) 500 MG tablet Take 500-1,000 mg by mouth every 8 (eight) hours as needed for mild pain (pain score 1-3) (not to exceed a sum total of 3,000 mg/day).   Yes Serpe, Gilmar Leitz, NP  aspirin 81 MG tablet Take 81 mg by mouth daily.   Yes [provider]  metFORMIN (GLUCOPHAGE) 1000 MG tablet TAKE 1 TABLET (1,000 MG TOTAL) BY MOUTH TWICE A DAY WITH FOOD Patient taking differently: Take 1,000 mg by mouth daily with breakfast. 08/24/22  Yes Shelva Majestic, MD  metoprolol succinate (TOPROL-XL) 50 MG 24 hr tablet TAKE 1 TABLET BY MOUTH DAILY. TAKE WITH OR IMMEDIATELY FOLLOWING A MEAL. 10/16/22  Yes Shelva Majestic, MD  amoxicillin-clavulanate (AUGMENTIN) 875-125 MG tablet Take 1 tablet by mouth 2 (two) times daily. Patient not taking:  Reported on 04/15/2023 02/15/23   Jarold Motto, PA  atorvastatin (LIPITOR) 80 MG tablet TAKE 1 TABLET DAILY. Patient not taking: Reported on 04/15/2023 08/30/21   Shelva Majestic, MD  blood glucose meter kit and supplies KIT Dispense based on patient and insurance preference. Use up to four times daily as directed. 03/31/22   Shelva Majestic, MD  clotrimazole (CLOTRIMAZOLE AF) 1 % cream Apply 1 Application topically 2 (two) times daily. Apply to irritated areas on penis and scrotum, groin. Patient not taking: Reported on 04/15/2023 12/25/22   Dulce Sellar, NP  fluconazole (DIFLUCAN) 150 MG tablet Take 1 pill today and the 2nd pill in 3 days. Patient not taking: Reported on 04/15/2023 12/25/22   Dulce Sellar, NP  gabapentin (NEURONTIN) 100 MG capsule Take 1 capsule (100 mg total) by mouth at bedtime. Patient not taking: Reported on 04/15/2023 11/22/21   Rodolph Bong, MD  mupirocin ointment (BACTROBAN) 2 % Apply 1 Application topically 2 (two) times daily. For 7 days. Do warm soak then dry thoroughly before putting this on Patient not taking: Reported on 04/15/2023 08/30/22   Shelva Majestic, MD  oseltamivir (TAMIFLU) 75 MG capsule Take 1 capsule (75 mg total) by mouth every 12 (twelve) hours. Patient not taking: Reported on 04/15/2023 04/14/23   Lowther, Amy, DO  testosterone cypionate (DEPO-TESTOSTERONE) 200 MG/ML injection Inject 1 mL (200 mg total)  into the muscle every 30 (thirty) days. 12/09/10 01/27/11  Swords, Valetta Mole, MD      Allergies    Alendronate and Metformin and related    Review of Systems   Review of Systems  Physical Exam Updated Vital Signs BP (!) 146/68 (BP Location: Left Arm)   Pulse 83   Temp (!) 100.6 F (38.1 C) (Axillary)   Resp 18   SpO2 100%  Physical Exam Vitals and nursing note reviewed.  Constitutional:      General: He is not in acute distress.    Appearance: Normal appearance. He is ill-appearing.  HENT:     Head: Normocephalic and  atraumatic.     Nose: Nose normal.     Mouth/Throat:     Mouth: Mucous membranes are moist.  Eyes:     Extraocular Movements: Extraocular movements intact.     Conjunctiva/sclera: Conjunctivae normal.  Cardiovascular:     Rate and Rhythm: Normal rate and regular rhythm.     Heart sounds: Normal heart sounds.  Pulmonary:     Effort: Pulmonary effort is normal.     Breath sounds: Normal breath sounds.  Abdominal:     General: Abdomen is flat.     Palpations: Abdomen is soft.     Tenderness: There is no abdominal tenderness.  Musculoskeletal:        General: Normal range of motion.     Cervical back: Normal range of motion.     Right lower leg: No edema.     Left lower leg: No edema.  Skin:    General: Skin is warm and dry.  Neurological:     General: No focal deficit present.     Mental Status: He is alert.  Psychiatric:     Comments: Mildly agitated, but easily redirectable      ED Results / Procedures / Treatments   Labs (all labs ordered are listed, but only abnormal results are displayed) Labs Reviewed  COMPREHENSIVE METABOLIC PANEL - Abnormal; Notable for the following components:      Result Value   CO2 17 (*)    Glucose, Bld 153 (*)    Calcium 8.0 (*)    Albumin 3.0 (*)    All other components within normal limits  CBC WITH DIFFERENTIAL/PLATELET - Abnormal; Notable for the following components:   RBC 3.10 (*)    Hemoglobin 9.7 (*)    HCT 29.8 (*)    Platelets 140 (*)    Neutro Abs 8.2 (*)    All other components within normal limits  CBC  BASIC METABOLIC PANEL  POC OCCULT BLOOD, ED    EKG EKG Interpretation Date/Time:  Sunday April 15 2023 16:37:09 EST Ventricular Rate:  94 PR Interval:    QRS Duration:  112 QT Interval:  407 QTC Calculation: 523 R Axis:   45  Text Interpretation: Atrial flutter Borderline intraventricular conduction delay Borderline low voltage, extremity leads Borderline repolarization abnormality Prolonged QT interval  Duplicate EKG Confirmed by Elayne Snare ([id number]) on 04/15/2023 5:05:38 PM  Radiology DG Chest Port 1 View Result Date: 04/15/2023 CLINICAL DATA:  Altered mental status and shortness of breath EXAM: PORTABLE CHEST 1 VIEW COMPARISON:  Chest radiograph dated 04/14/2023 FINDINGS: Low lung volumes with bronchovascular crowding. No focal consolidations. No pleural effusion or pneumothorax. The heart size and mediastinal contours are within normal limits. Median sternotomy wires are nondisplaced. Old left posterior rib fractures. IMPRESSION: Low lung volumes with bronchovascular crowding. No focal consolidations. Electronically Signed  By: Agustin Cree M.D.   On: 04/15/2023 16:41   DG Chest Portable 1 View Result Date: 04/14/2023 CLINICAL DATA:  Cough Dementia Diarrhea for 2 days EXAM: PORTABLE CHEST - 1 VIEW COMPARISON:  09/03/2012 FINDINGS: Cardiomediastinal silhouette and pulmonary vasculature are within normal limits. Lungs are clear. Evaluation is limited due to expiratory phase of imaging. Postsurgical changes of CABG are noted. IMPRESSION: No acute cardiopulmonary process. Electronically Signed   By: Acquanetta Belling M.D.   On: 04/14/2023 10:38    Procedures Procedures    Medications Ordered in ED Medications  aspirin EC tablet 81 mg (81 mg Oral Given 04/15/23 2157)  oseltamivir (TAMIFLU) capsule 30 mg (30 mg Oral Given 04/15/23 2157)  metoprolol succinate (TOPROL-XL) 24 hr tablet 50 mg (has no administration in time range)  enoxaparin (LOVENOX) injection 40 mg (40 mg Subcutaneous Given 04/15/23 2157)  acetaminophen (TYLENOL) tablet 650 mg (650 mg Oral Given 04/15/23 1958)    Or  acetaminophen (TYLENOL) suppository 650 mg ( Rectal See Alternative 04/15/23 1958)  ondansetron (ZOFRAN) tablet 4 mg (has no administration in time range)    Or  ondansetron (ZOFRAN) injection 4 mg (has no administration in time range)  LORazepam (ATIVAN) injection 0.25 mg (0.25 mg Intravenous Given 04/15/23 2157)     ED Course/ Medical Decision Making/ A&P Clinical Course as of 04/15/23 2328  Sun Apr 15, 2023  1728 Caretaker at bedside states she was with him on Friday. Normal alert and conversational. Walks with a walker using the belt assist.  She reports in signout from the caretaker today that he has been unable to walk since yesterday and they have had to use a lift to get him into bed.  They state that he has had episodes of unresponsiveness.  She states that he is more drowsy and less interactive today from his baseline. [VK]  1729 Mild anemia and thrombocytopenia from labs yesterday. [VK]  1741 Mildly low bicarb, possible due to mild dehydration. [VK]    Clinical Course User Index [VK] Rexford Maus, DO                                 Medical Decision Making This patient presents to the ED with chief complaint(s) of episode of unresponsiveness with pertinent past medical history of dementia, CAD, HTN, DM, GERD which further complicates the presenting complaint. The complaint involves an extensive differential diagnosis and also carries with it a high risk of complications and morbidity.    The differential diagnosis includes syncope, arrhythmia, anemia, dehydration, electrolyte abnormality, vasovagal syncope, orthostatic hypotension, sepsis, pneumonia, pneumothorax, pulmonary edema, pleural effusion, infection  Additional history obtained: Additional history obtained from EMS Records reviewed recent ED records  ED Course and Reassessment: On patient's arrival he is hemodynamically stable in no acute distress.  He is awake and alert moving all 4 extremities spontaneously but unable to give any history.  Patient will have EKG and labs performed to evaluate for possible syncope or signs of worsening infection.  Was diagnosed with the flu yesterday.  Will continue to attempt to contact family.  Independent labs interpretation:  The following labs were independently interpreted: mild  anemia and thrombocytopenia compared to baselind  Independent visualization of imaging: - I independently visualized the following imaging with scope of interpretation limited to determining acute life threatening conditions related to emergency care: CXR, which revealed no acute disease  Consultation: - Consulted or discussed management/test  interpretation w/ external professional: hospitalist  Consideration for admission or further workup: patient requires admission for his decompensation in the setting of Flu Social Determinants of health: N/A     Amount and/or Complexity of Data Reviewed Labs: ordered. Radiology: ordered.  Risk Decision regarding hospitalization.          Final Clinical Impression(s) / ED Diagnoses Final diagnoses:  Influenza  Somnolence  Generalized weakness  Anemia, unspecified type  Thrombocytopenia [facility])    Rx / DC Orders ED Discharge Orders     None         Rexford Maus, DO 04/15/23 2328

## 2023-04-15 NOTE — ED Triage Notes (Signed)
Pt BIBA from home due to unresponsiveness and confusion.  Pt was seen in [facility] 12.28 and diagnosed with flu. According to home health aide pt became unresponsive.   Pt has dementia at baseline.

## 2023-04-15 NOTE — H&P (Signed)
History and Physical    Steven [name] OZD:[id number] DOB: 1930/07/02 DOA: 04/15/2023  PCP: Shelva Majestic, MD   Chief Complaint: Steven [name]  HPI: Steven [name] is a 89 y.o. male with medical history significant of CAD, type 2 diabetes, dementia, hyperlipidemia who presents emergency department due to altered mental status.  Patient was seen on 12/28.  At that time she was having cough and diarrhea.  Patient caretaker noticed him feeling short of breath and less responsive.  He was brought to the ER due to further assessment.  Home his daughter states they are unable to care for him at home as he is unable to walk.  Hospitalist were consulted and patient was discharged with outpatient follow-up.  Patient presented again today with worsening symptoms unresponsiveness and confusion.  He was brought to the ER where he was found to be afebrile and hemodynamically stable.  Labs were obtained which showed bicarb 17, creatinine 0.90, WBC 9.8, hemoglobin 9.7, platelets 140 chest x-ray showed low lung volumes.  Due to failure to thrive at home patient was further workup.  On evaluation he was demented and unable to provide history regarding his presentation.   Review of Systems: Review of Systems  Constitutional:  Positive for malaise/fatigue.  HENT: Negative.    Eyes: Negative.   Respiratory: Negative.    Cardiovascular: Negative.   Gastrointestinal: Negative.   Genitourinary: Negative.   Musculoskeletal: Negative.   Skin: Negative.   Neurological: Negative.   Psychiatric/Behavioral: Negative.    All other systems reviewed and are negative.    As per HPI otherwise 10 point review of systems negative.   Allergies  Allergen Reactions   Alendronate     reflux    Past Medical History:  Diagnosis Date   Abnormality of gait 08/23/2012   Anemia    hx of   Arthritis 01-03-12   osteoarthritis-knees   CAD (coronary artery disease) 2004   s/p inferior wall Mi 2004 with PTCA/Stent; s/p CABG 2004    Degenerative arthritis    Diabetes mellitus    type II   Foot drop, bilateral 08/23/2012   GERD (gastroesophageal reflux disease)    Heart disease    History of kidney stones    History of myocardial infarction    Hyperlipidemia    Myocardial infarction [facility]) 01-03-12   '04   MYOCARDIAL INFARCTION, HX OF 04/19/2007   Qualifier: Diagnosis of  By: Everett Graff     Neuropathy 01-03-12   bil. feet   Pneumonia    hx of   Polyneuropathy in diabetes(357.2) 08/23/2012   Radiculopathy of lumbar region 08/23/2011   Followed by Dr. Venetia Maxon. S/p surgery. Resolved after surgery.      Past Surgical History:  Procedure Laterality Date   BACK SURGERY  2013   CARDIAC CATHETERIZATION  01-03-12   '04   CATARACT EXTRACTION Bilateral 2015   COLONOSCOPY     CORONARY ARTERY BYPASS GRAFT  2004   LIMA to LAD; SVG to ramus intermedius; SVG to PDA/PLSA   LAMINECTOMY  09/08/2011   Procedure: LUMBAR LAMINECTOMY FOR TUMOR;  Surgeon: Maeola Harman, MD;  Location: [facility];  Service: Neurosurgery;  Laterality: Left;  Left Thoracic twelve-Lumbar one transpedicular resection of epidural mass   TONSILLECTOMY  as child   TOTAL KNEE ARTHROPLASTY  01/15/2012   Procedure: TOTAL KNEE ARTHROPLASTY;  Surgeon: Loanne Drilling, MD;  Location: [facility];  Service: Orthopedics;  Laterality: Right;   TOTAL KNEE ARTHROPLASTY Left 09/16/2012  Procedure: LEFT TOTAL KNEE ARTHROPLASTY;  Surgeon: Loanne Drilling, MD;  Location: [facility];  Service: Orthopedics;  Laterality: Left;     reports that he has never smoked. He has never used smokeless tobacco. He reports that he does not drink alcohol and does not use drugs.  Family History  Problem Relation Age of Onset   Cirrhosis Mother        etiology unclear   Hypertension Father    Stroke Father    Cancer Sister        breast   Stroke Maternal Grandmother    Heart disease Maternal Grandfather        MI   Stroke Paternal Grandmother    Stroke Paternal Grandfather    Anesthesia  problems Neg Hx    Hypotension Neg Hx    Malignant hyperthermia Neg Hx    Pseudochol deficiency Neg Hx     Prior to Admission medications   Medication Sig Start Date End Date Taking? Authorizing Provider  acetaminophen (TYLENOL) 500 MG tablet Take 500 mg by mouth every 6 (six) hours as needed. Up to 6 tabs (3gm) qd    Anselm Lis, NP  amoxicillin-clavulanate (AUGMENTIN) 875-125 MG tablet Take 1 tablet by mouth 2 (two) times daily. 02/15/23   Jarold Motto, PA  aspirin 81 MG tablet Take 81 mg by mouth daily.    [provider]  atorvastatin (LIPITOR) 80 MG tablet TAKE 1 TABLET DAILY. 08/30/21   Shelva Majestic, MD  blood glucose meter kit and supplies KIT Dispense based on patient and insurance preference. Use up to four times daily as directed. 03/31/22   Shelva Majestic, MD  clotrimazole (CLOTRIMAZOLE AF) 1 % cream Apply 1 Application topically 2 (two) times daily. Apply to irritated areas on penis and scrotum, groin. 12/25/22   Dulce Sellar, NP  fluconazole (DIFLUCAN) 150 MG tablet Take 1 pill today and the 2nd pill in 3 days. Patient not taking: Reported on 02/15/2023 12/25/22   Dulce Sellar, NP  gabapentin (NEURONTIN) 100 MG capsule Take 1 capsule (100 mg total) by mouth at bedtime. 11/22/21   Rodolph Bong, MD  metFORMIN (GLUCOPHAGE) 1000 MG tablet TAKE 1 TABLET (1,000 MG TOTAL) BY MOUTH TWICE A DAY WITH FOOD 08/24/22   Shelva Majestic, MD  metoprolol succinate (TOPROL-XL) 50 MG 24 hr tablet TAKE 1 TABLET BY MOUTH DAILY. TAKE WITH OR IMMEDIATELY FOLLOWING A MEAL. 10/16/22   Shelva Majestic, MD  mupirocin ointment (BACTROBAN) 2 % Apply 1 Application topically 2 (two) times daily. For 7 days. Do warm soak then dry thoroughly before putting this on 08/30/22   Shelva Majestic, MD  oseltamivir (TAMIFLU) 75 MG capsule Take 1 capsule (75 mg total) by mouth every 12 (twelve) hours. 04/14/23   Lowther, Amy, DO  testosterone cypionate (DEPO-TESTOSTERONE) 200 MG/ML injection  Inject 1 mL (200 mg total) into the muscle every 30 (thirty) days. 12/09/10 01/27/11  Lindley Magnus, MD    Physical Exam: Vitals:   04/15/23 1628 04/15/23 1954  BP: (!) 145/129 97/76  Pulse: 97 91  Resp:  (!) 24  Temp: 98.1 F (36.7 C) 100.1 F (37.8 C)  TempSrc: Oral   SpO2: 100% 96%   Physical Exam Vitals reviewed.  Constitutional:      Appearance: He is normal weight.  HENT:     Head: Normocephalic.     Nose: Nose normal.     Mouth/Throat:     Mouth: Mucous membranes are  moist.  Eyes:     Conjunctiva/sclera: Conjunctivae normal.     Pupils: Pupils are equal, round, and reactive to light.  Cardiovascular:     Rate and Rhythm: Normal rate and regular rhythm.  Abdominal:     General: Abdomen is flat. Bowel sounds are normal.  Musculoskeletal:        General: Normal range of motion.     Cervical back: Normal range of motion.  Skin:    General: Skin is warm.     Capillary Refill: Capillary refill takes less than 2 seconds.  Neurological:     Mental Status: He is alert. He is disoriented.  Psychiatric:        Mood and Affect: Mood normal.        Labs on Admission: I have personally reviewed the patients's labs and imaging studies.  Assessment/Plan Principal Problem:   Influenza A    # Failure to thrive secondary to influenza - Patient unable to be cared for at home patient is nonambulatory with worsening mental status - Baseline cognitive impairment with dementia - Flu negative - Patient presented to ER yesterday and was discharged  Plan: PT/OT evaluation continue Tamiflu Continue supportive care  # Cognitive impairment/dementia-continue to monitor  # CAD-continue aspirin  #acute on Chronic anemia- trend hgb likely related to illness  # Hyperlipidemia-continue Lipitor, metoprolol  #Hyponaturemia- ctm  Admission status: Observation Med-Surg  Certification: The appropriate patient status for this patient is OBSERVATION. Observation status is  judged to be reasonable and necessary in order to provide the required intensity of service to ensure the patient's safety. The patient's presenting symptoms, physical exam findings, and initial radiographic and laboratory data in the context of their medical condition is felt to place them at decreased risk for further clinical deterioration. Furthermore, it is anticipated that the patient will be medically stable for discharge from the hospital within 2 midnights of admission.     Alan Mulder MD [facility] If 7PM-7AM, please contact night-coverage [email or web address]  04/15/2023, 8:00 PM

## 2023-04-16 ENCOUNTER — Encounter (HOSPITAL_COMMUNITY): Payer: Self-pay | Admitting: Internal Medicine

## 2023-04-16 ENCOUNTER — Other Ambulatory Visit: Payer: Self-pay

## 2023-04-16 DIAGNOSIS — D696 Thrombocytopenia, unspecified: Secondary | ICD-10-CM | POA: Diagnosis not present

## 2023-04-16 DIAGNOSIS — D649 Anemia, unspecified: Secondary | ICD-10-CM | POA: Diagnosis not present

## 2023-04-16 DIAGNOSIS — J101 Influenza due to other identified influenza virus with other respiratory manifestations: Secondary | ICD-10-CM | POA: Diagnosis not present

## 2023-04-16 DIAGNOSIS — R531 Weakness: Secondary | ICD-10-CM | POA: Diagnosis not present

## 2023-04-16 LAB — BASIC METABOLIC PANEL
Anion gap: 9 (ref 5–15)
BUN: 26 mg/dL — ABNORMAL HIGH (ref 8–23)
CO2: 20 mmol/L — ABNORMAL LOW (ref 22–32)
Calcium: 8.4 mg/dL — ABNORMAL LOW (ref 8.9–10.3)
Chloride: 103 mmol/L (ref 98–111)
Creatinine, Ser: 1.17 mg/dL (ref 0.61–1.24)
GFR, Estimated: 59 mL/min — ABNORMAL LOW (ref >60)
Glucose, Bld: 160 mg/dL — ABNORMAL HIGH (ref 70–99)
Potassium: 3.6 mmol/L (ref 3.5–5.1)
Sodium: 132 mmol/L — ABNORMAL LOW (ref 135–145)

## 2023-04-16 LAB — CBC
HCT: 34.2 % — ABNORMAL LOW (ref 39.0–52.0)
Hemoglobin: 11.4 g/dL — ABNORMAL LOW (ref 13.0–17.0)
MCH: 31.7 pg (ref 26.0–34.0)
MCHC: 33.3 g/dL (ref 30.0–36.0)
MCV: 95 fL (ref 80.0–100.0)
Platelets: 164 10*3/uL (ref 150–400)
RBC: 3.6 MIL/uL — ABNORMAL LOW (ref 4.22–5.81)
RDW: 13.9 % (ref 11.5–15.5)
WBC: 9.6 10*3/uL (ref 4.0–10.5)
nRBC: 0 % (ref 0.0–0.2)

## 2023-04-16 MED ORDER — SODIUM CHLORIDE 0.9 % IV BOLUS
500.0000 mL | Freq: Once | INTRAVENOUS | Status: AC
  Administered 2023-04-16: 500 mL via INTRAVENOUS

## 2023-04-16 NOTE — Plan of Care (Signed)

## 2023-04-16 NOTE — Plan of Care (Signed)

## 2023-04-16 NOTE — Progress Notes (Signed)
Pt aggressive with staff, biting, swinging on them and clawing. On-call notified with new orders received. Placed in mittens for staff/patient protection. Caretaker at bedside

## 2023-04-16 NOTE — Progress Notes (Signed)
[facility]  PROGRESS NOTE  BRENTIN ALANA WUJ:[id number] DOB: [birth year] DOA: 04/15/2023 PCP: Shelva Majestic, MD   Brief HPI:    89 y.o. male with medical history significant of CAD, type 2 diabetes, dementia, hyperlipidemia who presents emergency department due to altered mental status.  Patient was seen on 12/28.  At that time she was having cough and diarrhea.  Patient caretaker noticed him feeling short of breath and less responsive.  He was brought to the ER due to further assessment.  Home his daughter states they are unable to care for him at home as he is unable to walk.     Assessment/Plan:   Failure to thrive/influenza A -Has baseline cognitive impairment with dementia -Influenza A PCR is positive -Started on Tamiflu 30 mg p.o. twice daily  Coronary artery disease -Continue aspirin  Acute on chronic anemia -Hemoglobin is 11.4 , improved from 9.7 yesterday  Hyperlipidemia -Continue Lipitor, metoprolol  Will obtain PT OT evaluation  Medications     aspirin EC  81 mg Oral Daily   enoxaparin (LOVENOX) injection  40 mg Subcutaneous QHS   metoprolol succinate  50 mg Oral Daily   oseltamivir  30 mg Oral Q12H     Data Reviewed:   CBG:  No results for input(s): "GLUCAP" in the last 168 hours.  SpO2: 97 %    Vitals:   04/16/23 0550 04/16/23 0629 04/16/23 0743 04/16/23 1028  BP: (!) 88/41  117/68   Pulse: 75   75  Resp: 15     Temp: 98 F (36.7 C)     TempSrc: Oral     SpO2: 97%  97%   Weight:  66.5 kg    Height:  5\' 9"  (1.753 m)        Data Reviewed:  Basic Metabolic Panel: Recent Labs  Lab 04/14/23 1043 04/15/23 1706 04/16/23 0500  NA 134* 135 132*  K 3.8 3.8 3.6  CL 103 105 103  CO2 22 17* 20*  GLUCOSE 135* 153* 160*  BUN 19 18 26*  CREATININE 0.92 0.90 1.17  CALCIUM 9.0 8.0* 8.4*    CBC: Recent Labs  Lab 04/14/23 1043 04/15/23 1706 04/16/23 0500  WBC 7.2 9.8 9.6  NEUTROABS 5.2 8.2*  --   HGB 12.3* 9.7* 11.4*  HCT  37.9* 29.8* 34.2*  MCV 95.0 96.1 95.0  PLT 213 140* 164    LFT Recent Labs  Lab 04/14/23 1043 04/15/23 1706  AST 14* 25  ALT 10 12  ALKPHOS 70 55  BILITOT 0.6 0.7  PROT 7.0 6.6  ALBUMIN 3.4* 3.0*     Antibiotics: Anti-infectives (From admission, onward)    Start     Dose/Rate Route Frequency Ordered Stop   04/15/23 2200  oseltamivir (TAMIFLU) capsule 30 mg        30 mg Oral Every 12 hours 04/15/23 1901 04/20/23 2159        DVT prophylaxis: Lovenox  Code Status: Full code  Family Communication: No family at bedside   CONSULTS    Subjective   Denies shortness of breath   Objective    Physical Examination:   General-appears in no acute distress Heart-S1-S2, regular, no murmur auscultated Lungs-clear to auscultation bilaterally, no wheezing or crackles auscultated Abdomen-soft, nontender, no organomegaly Extremities-no edema in the lower extremities Neuro-alert, oriented x3, no focal deficit noted   Status is: Inpatient:             Meredeth Ide   [facility]  If 7PM-7AM, please contact night-coverage at [email or web address], Office  [phone]   04/16/2023, 12:01 PM  LOS: 0 days

## 2023-04-16 NOTE — Care Management Obs Status (Addendum)
MEDICARE OBSERVATION STATUS NOTIFICATION   Patient Details  Name: Steven [name] MRN: [id number] Date of Birth: [birth year]   Medicare Observation Status Notification Given:  Yes on patient's chart.    Patient confused, this CSW Windell Moulding, MSW, [place], [phone], attempted to contact patient's daughter Nathanyal Mcmahill, [phone], but had to leave a message for a call back.    Halford Chessman 04/16/2023, 4:49 PM

## 2023-04-17 DIAGNOSIS — J111 Influenza due to unidentified influenza virus with other respiratory manifestations: Secondary | ICD-10-CM | POA: Diagnosis not present

## 2023-04-17 DIAGNOSIS — R400 Somnolence: Secondary | ICD-10-CM | POA: Diagnosis not present

## 2023-04-17 DIAGNOSIS — R531 Weakness: Secondary | ICD-10-CM | POA: Diagnosis not present

## 2023-04-17 DIAGNOSIS — D649 Anemia, unspecified: Secondary | ICD-10-CM | POA: Diagnosis not present

## 2023-04-17 NOTE — Plan of Care (Signed)
  Problem: Clinical Measurements: Goal: Ability to maintain clinical measurements within normal limits will improve Outcome: Progressing   Problem: Clinical Measurements: Goal: Will remain free from infection Outcome: Progressing   Problem: Clinical Measurements: Goal: Diagnostic test results will improve Outcome: Progressing   Problem: Clinical Measurements: Goal: Respiratory complications will improve Outcome: Progressing   

## 2023-04-17 NOTE — Evaluation (Signed)
 Physical Therapy Evaluation Patient Details Name: Steven [name] MRN: [id number] DOB: 09-13-1930 Today's Date: 04/17/2023  History of Present Illness  89 y.o. male brought to ED 04/14/23 with cough, diarrhea, AMS, inability to ambulate, positive for flu A. PMH: CAD, type 2 diabetes, dementia, hyperlipidemia  Clinical Impression  Pt admitted with above diagnosis.  Pt currently with functional limitations due to the deficits listed below (see PT Problem List). Pt will benefit from acute skilled PT to increase their independence and safety with mobility to allow discharge.       The patient is awake, does not really follow directions. Patient demonstrates resistance when mobilizing in bed, rolling, moving to sitting and supine.    Patient's home caregiver present to provide information, daughter not present.  Reports that patient was ambulatory until several days ago. Patient has 24/7 caregivers per [facility], who is present.  Per Ed note, daughter indicating inability to provide care at home. Hopefully patient will be able to progress  and participate in therapy.     If plan is discharge home, recommend the following: Two people to help with walking and/or transfers;Two people to help with bathing/dressing/bathroom;Assist for transportation   Can travel by private vehicle   No    Equipment Recommendations None recommended by PT  Recommendations for Other Services       Functional Status Assessment Patient has had a recent decline in their functional status and demonstrates the ability to make significant improvements in function in a reasonable and predictable amount of time.     Precautions / Restrictions Precautions Precautions: Fall Precaution Comments: can be  agitated, Restrictions Weight Bearing Restrictions Per Provider Order: No      Mobility  Bed Mobility Overal bed mobility: Needs Assistance Bed Mobility: Supine to Sit, Sit to Supine, Rolling Rolling: +2 for physical  assistance, +2 for safety/equipment, Total assist   Supine to sit: Total assist, +2 for physical assistance, +2 for safety/equipment Sit to supine: Total assist, +2 for physical assistance, +2 for safety/equipment   General bed mobility comments: patient requires  much assistance  to roll, tends to push back when  moving, gets rigid in  extremities and trunk    Transfers                   General transfer comment: unable, patient pushing back  when sitting    Ambulation/Gait                  Stairs            Wheelchair Mobility     Tilt Bed    Modified Rankin (Stroke Patients Only)       Balance Overall balance assessment: Needs assistance Sitting-balance support: Feet supported, No upper extremity supported Sitting balance-Leahy Scale: Zero Sitting balance - Comments: strong posterior pushing  backward, no  balance control, does not follow cues to lean forward notr reach Postural control: Posterior lean                                   Pertinent Vitals/Pain Pain Assessment Pain Assessment: No/denies pain    Home Living Family/patient expects to be discharged to:: Private residence Living Arrangements: Children Available Help at Discharge: Personal care attendant;Available 24 hours/day Type of Home: House Home Access: Stairs to enter   [employer] of Steps: 3       Additional Comments: caregiver present, provided information  Prior Function               Mobility Comments: unable to ambulate  over  past several days, was able to ambu,kate with RW prior ADLs Comments: assisted with ADL's     Extremity/Trunk Assessment   Upper Extremity Assessment Upper Extremity Assessment: Generalized weakness (in mits)    Lower Extremity Assessment Lower Extremity Assessment: Generalized weakness    Cervical / Trunk Assessment Cervical / Trunk Assessment: Kyphotic  Communication    Communication Communication: Difficulty communicating thoughts/reduced clarity of speech (does sweatr at times,)  Cognition Arousal: Alert Behavior During Therapy: Restless Overall Cognitive Status: History of cognitive impairments - at baseline                                 General Comments: gets anxious when moved,        General Comments      Exercises     Assessment/Plan    PT Assessment Patient needs continued PT services  PT Problem List Decreased strength;Decreased mobility;Decreased safety awareness;Decreased coordination;Decreased activity tolerance;Decreased cognition;Decreased balance       PT Treatment Interventions DME instruction;Therapeutic activities;Cognitive remediation;Gait training;Therapeutic exercise;Patient/family education;Functional mobility training;Balance training    PT Goals (Current goals can be found in the Care Plan section)  Acute Rehab PT Goals Patient Stated Goal: per caregiver,  feel he can go home, has 24/7  caregivers, daughter not present PT Goal Formulation: Patient unable to participate in goal setting Time For Goal Achievement: 05/01/23 Potential to Achieve Goals: Fair    Frequency Min 1X/week     Co-evaluation               AM-PAC PT 6 Clicks Mobility  Outcome Measure Help needed turning from your back to your side while in a flat bed without using bedrails?: Total Help needed moving from lying on your back to sitting on the side of a flat bed without using bedrails?: Total Help needed moving to and from a bed to a chair (including a wheelchair)?: Total Help needed standing up from a chair using your arms (e.g., wheelchair or bedside chair)?: Total Help needed to walk in hospital room?: Total Help needed climbing 3-5 steps with a railing? : Total 6 Click Score: 6    End of Session   Activity Tolerance: Patient tolerated treatment well Patient left: in bed;with call bell/phone within reach;with  nursing/sitter in room;with family/visitor present Nurse Communication: Mobility status;Need for lift equipment PT Visit Diagnosis: Unsteadiness on feet (R26.81);Muscle weakness (generalized) (M62.81);Difficulty in walking, not elsewhere classified (R26.2)    Time: 1050-1120 PT Time Calculation (min) (ACUTE ONLY): 30 min   Charges:   PT Evaluation $PT Eval Low Complexity: 1 Low PT Treatments $Therapeutic Activity: 8-22 mins PT General Charges $$ ACUTE PT VISIT: 1 Visit         Darice Potters PT Acute Rehabilitation Services Office [phone] Weekend pager-[phone]   Potters Darice Norris 04/17/2023, 1:35 PM

## 2023-04-17 NOTE — Plan of Care (Signed)
  Problem: Education: Goal: Knowledge of General Education information will improve Description: Including pain rating scale, medication(s)/side effects and non-pharmacologic comfort measures Outcome: Progressing   Problem: Clinical Measurements: Goal: Ability to maintain clinical measurements within normal limits will improve Outcome: Progressing Goal: Will remain free from infection Outcome: Progressing Goal: Diagnostic test results will improve Outcome: Progressing Goal: Respiratory complications will improve Outcome: Progressing   Problem: Nutrition: Goal: Adequate nutrition will be maintained Outcome: Progressing   Problem: Coping: Goal: Level of anxiety will decrease Outcome: Progressing   Problem: Pain Management: Goal: General experience of comfort will improve Outcome: Progressing

## 2023-04-17 NOTE — Progress Notes (Signed)
 [facility]  PROGRESS NOTE  Steven [name] FMW:[id number] DOB: 05-Nov-1930 DOA: 04/15/2023 PCP: Katrinka Garnette KIDD, MD   Brief HPI:    89 y.o. male with medical history significant of CAD, type 2 diabetes, dementia, hyperlipidemia who presents emergency department due to altered mental status.  Patient was seen on 12/28.  At that time she was having cough and diarrhea.  Patient caretaker noticed him feeling short of breath and less responsive.  He was brought to the ER due to further assessment.  Home his daughter states they are unable to care for him at home as he is unable to walk.     Assessment/Plan:   Failure to thrive/influenza A -Has baseline cognitive impairment with dementia -Influenza A PCR is positive -Started on Tamiflu  30 mg p.o. twice daily  Coronary artery disease -Continue aspirin   Acute on chronic anemia -Hemoglobin is 11.4 , improved from 9.7 yesterday  Hyperlipidemia -Continue Lipitor,   Hypertension -Continue metoprolol   PT evaluation obtained, recommend to go to skilled facility for rehab  Medications     aspirin  EC  81 mg Oral Daily   enoxaparin  (LOVENOX ) injection  40 mg Subcutaneous QHS   metoprolol  succinate  50 mg Oral Daily   oseltamivir   30 mg Oral Q12H     Data Reviewed:   CBG:  No results for input(s): GLUCAP in the last 168 hours.  SpO2: 98 %    Vitals:   04/16/23 1326 04/16/23 2038 04/17/23 0400 04/17/23 1041  BP: 131/69 (!) 137/56 127/74 114/65  Pulse: 65 73 85 79  Resp: 18 15    Temp: 97.6 F (36.4 C) 99.5 F (37.5 C) 98.4 F (36.9 C)   TempSrc:  Oral Oral   SpO2: 100% 98% 98%   Weight:      Height:          Data Reviewed:  Basic Metabolic Panel: Recent Labs  Lab 04/14/23 1043 04/15/23 1706 04/16/23 0500  NA 134* 135 132*  K 3.8 3.8 3.6  CL 103 105 103  CO2 22 17* 20*  GLUCOSE 135* 153* 160*  BUN 19 18 26*  CREATININE 0.92 0.90 1.17  CALCIUM  9.0 8.0* 8.4*    CBC: Recent Labs  Lab  04/14/23 1043 04/15/23 1706 04/16/23 0500  WBC 7.2 9.8 9.6  NEUTROABS 5.2 8.2*  --   HGB 12.3* 9.7* 11.4*  HCT 37.9* 29.8* 34.2*  MCV 95.0 96.1 95.0  PLT 213 140* 164    LFT Recent Labs  Lab 04/14/23 1043 04/15/23 1706  AST 14* 25  ALT 10 12  ALKPHOS 70 55  BILITOT 0.6 0.7  PROT 7.0 6.6  ALBUMIN 3.4* 3.0*     Antibiotics: Anti-infectives (From admission, onward)    Start     Dose/Rate Route Frequency Ordered Stop   04/15/23 2200  oseltamivir  (TAMIFLU ) capsule 30 mg        30 mg Oral Every 12 hours 04/15/23 1901 04/20/23 2159        DVT prophylaxis: Lovenox   Code Status: Full code  Family Communication: No family at bedside   CONSULTS    Subjective   No new complaints   Objective    Physical Examination:   Alert, confused at baseline S1-S2, regular Lungs clear to auscultation bilaterally Abdomen is soft, nontender, no organomegaly   Status is: Inpatient:      Pressure Injury 04/15/23 Sacrum Stage 2 -  Partial thickness loss of dermis presenting as a shallow open injury with a red,  pink wound bed without slough. (Active)  04/15/23 2039  Location: Sacrum  Location Orientation:   Staging: Stage 2 -  Partial thickness loss of dermis presenting as a shallow open injury with a red, pink wound bed without slough.  Wound Description (Comments):   Present on Admission: Yes        Connelly Spruell S Mckennah Kretchmer   [facility] If 7PM-7AM, please contact night-coverage at [email or web address], Office  [phone]   04/17/2023, 10:52 AM  LOS: 0 days

## 2023-04-18 DIAGNOSIS — R2681 Unsteadiness on feet: Secondary | ICD-10-CM | POA: Diagnosis not present

## 2023-04-18 DIAGNOSIS — M19071 Primary osteoarthritis, right ankle and foot: Secondary | ICD-10-CM | POA: Diagnosis not present

## 2023-04-18 DIAGNOSIS — Z888 Allergy status to other drugs, medicaments and biological substances status: Secondary | ICD-10-CM | POA: Diagnosis not present

## 2023-04-18 DIAGNOSIS — Z79899 Other long term (current) drug therapy: Secondary | ICD-10-CM | POA: Diagnosis not present

## 2023-04-18 DIAGNOSIS — R400 Somnolence: Secondary | ICD-10-CM | POA: Diagnosis not present

## 2023-04-18 DIAGNOSIS — Z9841 Cataract extraction status, right eye: Secondary | ICD-10-CM | POA: Diagnosis not present

## 2023-04-18 DIAGNOSIS — Z87442 Personal history of urinary calculi: Secondary | ICD-10-CM | POA: Diagnosis not present

## 2023-04-18 DIAGNOSIS — R627 Adult failure to thrive: Secondary | ICD-10-CM | POA: Diagnosis not present

## 2023-04-18 DIAGNOSIS — M85871 Other specified disorders of bone density and structure, right ankle and foot: Secondary | ICD-10-CM | POA: Diagnosis not present

## 2023-04-18 DIAGNOSIS — D638 Anemia in other chronic diseases classified elsewhere: Secondary | ICD-10-CM | POA: Diagnosis not present

## 2023-04-18 DIAGNOSIS — I2510 Atherosclerotic heart disease of native coronary artery without angina pectoris: Secondary | ICD-10-CM | POA: Diagnosis not present

## 2023-04-18 DIAGNOSIS — D649 Anemia, unspecified: Secondary | ICD-10-CM | POA: Diagnosis not present

## 2023-04-18 DIAGNOSIS — M6281 Muscle weakness (generalized): Secondary | ICD-10-CM | POA: Diagnosis not present

## 2023-04-18 DIAGNOSIS — Z743 Need for continuous supervision: Secondary | ICD-10-CM | POA: Diagnosis not present

## 2023-04-18 DIAGNOSIS — F0390 Unspecified dementia without behavioral disturbance: Secondary | ICD-10-CM | POA: Diagnosis present

## 2023-04-18 DIAGNOSIS — Z8249 Family history of ischemic heart disease and other diseases of the circulatory system: Secondary | ICD-10-CM | POA: Diagnosis not present

## 2023-04-18 DIAGNOSIS — K219 Gastro-esophageal reflux disease without esophagitis: Secondary | ICD-10-CM | POA: Diagnosis present

## 2023-04-18 DIAGNOSIS — R4182 Altered mental status, unspecified: Secondary | ICD-10-CM | POA: Diagnosis present

## 2023-04-18 DIAGNOSIS — M7989 Other specified soft tissue disorders: Secondary | ICD-10-CM | POA: Diagnosis not present

## 2023-04-18 DIAGNOSIS — Z7982 Long term (current) use of aspirin: Secondary | ICD-10-CM | POA: Diagnosis not present

## 2023-04-18 DIAGNOSIS — Z9842 Cataract extraction status, left eye: Secondary | ICD-10-CM | POA: Diagnosis not present

## 2023-04-18 DIAGNOSIS — D6959 Other secondary thrombocytopenia: Secondary | ICD-10-CM | POA: Diagnosis present

## 2023-04-18 DIAGNOSIS — J101 Influenza due to other identified influenza virus with other respiratory manifestations: Secondary | ICD-10-CM | POA: Diagnosis present

## 2023-04-18 DIAGNOSIS — Z951 Presence of aortocoronary bypass graft: Secondary | ICD-10-CM | POA: Diagnosis not present

## 2023-04-18 DIAGNOSIS — I10 Essential (primary) hypertension: Secondary | ICD-10-CM | POA: Diagnosis not present

## 2023-04-18 DIAGNOSIS — M21371 Foot drop, right foot: Secondary | ICD-10-CM | POA: Diagnosis present

## 2023-04-18 DIAGNOSIS — E1142 Type 2 diabetes mellitus with diabetic polyneuropathy: Secondary | ICD-10-CM | POA: Diagnosis present

## 2023-04-18 DIAGNOSIS — R1310 Dysphagia, unspecified: Secondary | ICD-10-CM | POA: Diagnosis not present

## 2023-04-18 DIAGNOSIS — E119 Type 2 diabetes mellitus without complications: Secondary | ICD-10-CM | POA: Diagnosis not present

## 2023-04-18 DIAGNOSIS — I252 Old myocardial infarction: Secondary | ICD-10-CM | POA: Diagnosis not present

## 2023-04-18 DIAGNOSIS — Z955 Presence of coronary angioplasty implant and graft: Secondary | ICD-10-CM | POA: Diagnosis not present

## 2023-04-18 DIAGNOSIS — E785 Hyperlipidemia, unspecified: Secondary | ICD-10-CM | POA: Diagnosis not present

## 2023-04-18 DIAGNOSIS — R531 Weakness: Secondary | ICD-10-CM | POA: Diagnosis not present

## 2023-04-18 DIAGNOSIS — J111 Influenza due to unidentified influenza virus with other respiratory manifestations: Secondary | ICD-10-CM | POA: Diagnosis not present

## 2023-04-18 DIAGNOSIS — Z7984 Long term (current) use of oral hypoglycemic drugs: Secondary | ICD-10-CM | POA: Diagnosis not present

## 2023-04-18 DIAGNOSIS — L89152 Pressure ulcer of sacral region, stage 2: Secondary | ICD-10-CM | POA: Diagnosis present

## 2023-04-18 DIAGNOSIS — Z7401 Bed confinement status: Secondary | ICD-10-CM | POA: Diagnosis not present

## 2023-04-18 DIAGNOSIS — Z823 Family history of stroke: Secondary | ICD-10-CM | POA: Diagnosis not present

## 2023-04-18 DIAGNOSIS — M79671 Pain in right foot: Secondary | ICD-10-CM | POA: Diagnosis not present

## 2023-04-18 NOTE — Plan of Care (Signed)

## 2023-04-18 NOTE — Plan of Care (Signed)
   Problem: Activity: Goal: Risk for activity intolerance will decrease Outcome: Progressing   Problem: Nutrition: Goal: Adequate nutrition will be maintained Outcome: Progressing   Problem: Coping: Goal: Level of anxiety will decrease Outcome: Progressing   Problem: Pain Management: Goal: General experience of comfort will improve Outcome: Progressing   Problem: Safety: Goal: Ability to remain free from injury will improve Outcome: Progressing   Problem: Skin Integrity: Goal: Risk for impaired skin integrity will decrease Outcome: Progressing

## 2023-04-18 NOTE — Progress Notes (Signed)
 [facility]  PROGRESS NOTE  Steven [name] FMW:[id number] DOB: February 01, 1931 DOA: 04/15/2023 PCP: Katrinka Garnette KIDD, MD   Brief HPI:    89 y.o. male with medical history significant of CAD, type 2 diabetes, dementia, hyperlipidemia who presents emergency department due to altered mental status.  Patient was seen on 12/28.  At that time she was having cough and diarrhea.  Patient caretaker noticed him feeling short of breath and less responsive.  He was brought to the ER due to further assessment.  Home his daughter states they are unable to care for him at home as he is unable to walk.     Assessment/Plan:   Failure to thrive/influenza A -Has baseline cognitive impairment with dementia -Influenza A PCR is positive -Started on Tamiflu  30 mg p.o. twice daily for 5 days  Coronary artery disease -Continue aspirin   Acute on chronic anemia -Hemoglobin is 11.4 , improved from 9.7 yesterday  Hyperlipidemia -Continue Lipitor,   Hypertension -Continue metoprolol   PT evaluation obtained, recommend to go to skilled facility for rehab  Medications     aspirin  EC  81 mg Oral Daily   enoxaparin  (LOVENOX ) injection  40 mg Subcutaneous QHS   metoprolol  succinate  50 mg Oral Daily   oseltamivir   30 mg Oral Q12H     Data Reviewed:   CBG:  No results for input(s): GLUCAP in the last 168 hours.  SpO2: 97 %    Vitals:   04/17/23 2129 04/18/23 0523 04/18/23 0951 04/18/23 1328  BP: (!) 148/103 135/78 135/78 (!) 149/72  Pulse: 67 75 75 61  Resp: 18 18  18   Temp: 98.4 F (36.9 C) 98.7 F (37.1 C)  97.8 F (36.6 C)  TempSrc: Oral Oral  Oral  SpO2: 98% 95%  97%  Weight:      Height:          Data Reviewed:  Basic Metabolic Panel: Recent Labs  Lab 04/14/23 1043 04/15/23 1706 04/16/23 0500  NA 134* 135 132*  K 3.8 3.8 3.6  CL 103 105 103  CO2 22 17* 20*  GLUCOSE 135* 153* 160*  BUN 19 18 26*  CREATININE 0.92 0.90 1.17  CALCIUM  9.0 8.0* 8.4*    CBC: Recent  Labs  Lab 04/14/23 1043 04/15/23 1706 04/16/23 0500  WBC 7.2 9.8 9.6  NEUTROABS 5.2 8.2*  --   HGB 12.3* 9.7* 11.4*  HCT 37.9* 29.8* 34.2*  MCV 95.0 96.1 95.0  PLT 213 140* 164    LFT Recent Labs  Lab 04/14/23 1043 04/15/23 1706  AST 14* 25  ALT 10 12  ALKPHOS 70 55  BILITOT 0.6 0.7  PROT 7.0 6.6  ALBUMIN 3.4* 3.0*     Antibiotics: Anti-infectives (From admission, onward)    Start     Dose/Rate Route Frequency Ordered Stop   04/15/23 2200  oseltamivir  (TAMIFLU ) capsule 30 mg        30 mg Oral Every 12 hours 04/15/23 1901 04/20/23 2159        DVT prophylaxis: Lovenox   Code Status: Full code  Family Communication: No family at bedside   CONSULTS    Subjective   Denies any complaints.   Objective    Physical Examination:    General-appears in no acute distress Heart-S1-S2, regular, no murmur auscultated Lungs-clear to auscultation bilaterally, no wheezing or crackles auscultated Abdomen-soft, nontender, no organomegaly Extremities-no edema in the lower extremities Neuro-alert, answering questions appropriately  Status is: Inpatient:      Pressure Injury  04/15/23 Sacrum Stage 2 -  Partial thickness loss of dermis presenting as a shallow open injury with a red, pink wound bed without slough. (Active)  04/15/23 2039  Location: Sacrum  Location Orientation:   Staging: Stage 2 -  Partial thickness loss of dermis presenting as a shallow open injury with a red, pink wound bed without slough.  Wound Description (Comments):   Present on Admission: Yes        Steven [name]   [facility] If 7PM-7AM, please contact night-coverage at [email or web address], Office  [phone]   04/18/2023, 3:26 PM  LOS: 0 days

## 2023-04-18 NOTE — Plan of Care (Signed)

## 2023-04-19 DIAGNOSIS — J111 Influenza due to unidentified influenza virus with other respiratory manifestations: Secondary | ICD-10-CM | POA: Diagnosis not present

## 2023-04-19 DIAGNOSIS — R400 Somnolence: Secondary | ICD-10-CM | POA: Diagnosis not present

## 2023-04-19 DIAGNOSIS — D649 Anemia, unspecified: Secondary | ICD-10-CM | POA: Diagnosis not present

## 2023-04-19 MED ORDER — GUAIFENESIN-DM 100-10 MG/5ML PO SYRP
5.0000 mL | ORAL_SOLUTION | ORAL | Status: DC | PRN
  Administered 2023-04-19 – 2023-04-23 (×4): 5 mL via ORAL
  Filled 2023-04-19 (×6): qty 5

## 2023-04-19 NOTE — Progress Notes (Signed)
 Physical Therapy Treatment Patient Details Name: Steven [name] MRN: [id number] DOB: April 21, 1930 Today's Date: 04/19/2023   History of Present Illness 89 y.o. male brought to ED 04/14/23 with cough, diarrhea, AMS, inability to ambulate, positive for flu A. PMH: CAD, type 2 diabetes, dementia, hyperlipidemia    PT Comments  Co Tx with OT (Eval) Pt in bed with B mittens and personal Aide General Comments: AxO x 1 good eye contact and responds to name but difficulty following commands and resistant. Assisted to EOB was difficult.  General bed mobility comments: pt required Total Asisst + 2 pt 5% present with resistance due to cognition.  Utilized bed pad to complete.  Severe posterior lean/pushing/resistance/fear of falling when attempted sitting EOB.  Attempted sit to stand using a rolled flat sheet around pt to assist with forward weight shift however unsuccessful due to increased pt posterior pushing/fear of falling.  Assisted back to supine and positioned to comfort.   Prior home with Daughter and personal care Aides.  Pt was able to amb with assist.  LPT has rec SNF.    If plan is discharge home, recommend the following: Two people to help with walking and/or transfers;Two people to help with bathing/dressing/bathroom;Assist for transportation   Can travel by private vehicle     No  Equipment Recommendations  None recommended by PT    Recommendations for Other Services       Precautions / Restrictions Precautions Precautions: Fall Precaution Comments: Hx Dementia Restrictions Weight Bearing Restrictions Per Provider Order: No     Mobility  Bed Mobility Overal bed mobility: Needs Assistance Bed Mobility: Supine to Sit, Sit to Supine, Rolling     Supine to sit: Total assist, +2 for physical assistance, +2 for safety/equipment Sit to supine: Total assist, +2 for physical assistance, +2 for safety/equipment   General bed mobility comments: pt required Total Asisst + 2 pt 5%  present with resistance due to cognition.  Utilized bed pad to complete.  Severe posterior lean/pushing/resistance/fear of falling when attempted sitting EOB.    Transfers                   General transfer comment: unable to attempt due to max posterior lean/pushing    Ambulation/Gait               General Gait Details: unable   Stairs             Wheelchair Mobility     Tilt Bed    Modified Rankin (Stroke Patients Only)       Balance                                            Cognition Arousal: Alert   Overall Cognitive Status: History of cognitive impairments - at baseline                                 General Comments: AxO x 1 good eye contact and responds to name but difficulty following commands and resistant.        Exercises      General Comments        Pertinent Vitals/Pain Pain Assessment Pain Assessment: Faces Faces Pain Scale: Hurts a little bit Pain Location: B feet (edema) Pain Descriptors / Indicators: Grimacing Pain Intervention(s): Monitored during  session    Home Living                          Prior Function            PT Goals (current goals can now be found in the care plan section) Progress towards PT goals: Progressing toward goals    Frequency    Min 1X/week      PT Plan      Co-evaluation PT/OT/SLP Co-Evaluation/Treatment: Yes Reason for Co-Treatment: Necessary to address cognition/behavior during functional activity;For patient/therapist safety PT goals addressed during session: Mobility/safety with mobility        AM-PAC PT 6 Clicks Mobility   Outcome Measure  Help needed turning from your back to your side while in a flat bed without using bedrails?: Total Help needed moving from lying on your back to sitting on the side of a flat bed without using bedrails?: Total Help needed moving to and from a bed to a chair (including a wheelchair)?:  Total Help needed standing up from a chair using your arms (e.g., wheelchair or bedside chair)?: Total Help needed to walk in hospital room?: Total Help needed climbing 3-5 steps with a railing? : Total 6 Click Score: 6    End of Session Equipment Utilized During Treatment: Gait belt Activity Tolerance: Other (comment) (limited due to cognition) Patient left: in bed;with call bell/phone within reach;with nursing/sitter in room;with family/visitor present Nurse Communication: Mobility status PT Visit Diagnosis: Unsteadiness on feet (R26.81);Muscle weakness (generalized) (M62.81);Difficulty in walking, not elsewhere classified (R26.2)     Time: 9059-9044 PT Time Calculation (min) (ACUTE ONLY): 15 min  Charges:    $Therapeutic Activity: 8-22 mins PT General Charges $$ ACUTE PT VISIT: 1 Visit                     Katheryn Leap  PTA Acute  Rehabilitation Services Office M-F          [phone]

## 2023-04-19 NOTE — Evaluation (Signed)
 Occupational Therapy Evaluation/Discharge Patient Details Name: Steven [name] MRN: [id number] DOB: 1930/09/08 Today's Date: 04/19/2023   History of Present Illness 89 y.o. male brought to ED 04/14/23 with cough, diarrhea, AMS, inability to ambulate, positive for flu A. PMH: CAD, type 2 diabetes, dementia, hyperlipidemia   Clinical Impression   Pt admitted with the above diagnosis. Pt currently with functional limitations due to the deficits listed below (see OT Problem List). Prior to admit, pt was receiving 24/7 assist at home for ADL tasks and walking with RW. Due to severity of cognitive deficits, recommend pt continue to receive assistance for all ADL tasks as before and work with PT for mobility deficits. No acute OT needs identified. Will sign off.          If plan is discharge home, recommend the following: Other (comment) (24/7 supervision and assist with all ADL tasks as previously)    Functional Status Assessment  Patient has not had a recent decline in their functional status  Equipment Recommendations  None recommended by OT       Precautions / Restrictions Precautions Precautions: Fall Precaution Comments: Hx Dementia Restrictions Weight Bearing Restrictions Per Provider Order: No      Mobility Bed Mobility Overal bed mobility: Needs Assistance Bed Mobility: Supine to Sit, Sit to Supine, Rolling Rolling: +2 for physical assistance, +2 for safety/equipment, Total assist   Supine to sit: Total assist, +2 for physical assistance, +2 for safety/equipment, HOB elevated Sit to supine: Total assist, +2 for physical assistance, +2 for safety/equipment   General bed mobility comments: Multimodel cueing provided to initiate and sequence through bed mobility. Pt requiring 2 person assist d/t severity of dementia and resistance to follow commands. Pt with severe posterior lean while seated EOB with fear of falling.    Transfers Overall transfer level: Needs assistance     General transfer comment: Attempted sit to stand although pt demonstrated severe resistance and pushing posteriorly. Pain and bilateral feet verbalized. Pt was assisted back to bed.      Balance Overall balance assessment: Needs assistance Sitting-balance support: Feet supported, No upper extremity supported Sitting balance-Leahy Scale: Zero Sitting balance - Comments: strong posterior pushing  backward, no  balance control, does not follow cues to lean forward. Postural control: Posterior lean     Standing balance comment: Unable to attempt d/t safety reasons            ADL either performed or assessed with clinical judgement   ADL Overall ADL's : At baseline      General ADL Comments: Total A required to complete all BADL tasks.     Vision Baseline Vision/History: 0 No visual deficits Ability to See in Adequate Light: 0 Adequate Patient Visual Report: No change from baseline Vision Assessment?: No apparent visual deficits     Perception Perception: Not tested       Praxis Praxis: Not tested       Pertinent Vitals/Pain Pain Assessment Pain Assessment: Faces Faces Pain Scale: Hurts a little bit Pain Location: Bilateral feet (edema), Right shoulder when mobilized. Pain Descriptors / Indicators: Grimacing, Guarding Pain Intervention(s): Limited activity within patient's tolerance, Monitored during session, Repositioned     Extremity/Trunk Assessment Upper Extremity Assessment Upper Extremity Assessment: Overall WFL for tasks assessed   Lower Extremity Assessment Lower Extremity Assessment: Generalized weakness   Cervical / Trunk Assessment Cervical / Trunk Assessment: Kyphotic   Communication Communication Communication: Difficulty following commands/understanding Following commands: Follows one step commands inconsistently Cueing Techniques: Verbal cues;Gestural  cues;Tactile cues   Cognition Arousal: Alert Behavior During Therapy: Restless, Flat  affect Overall Cognitive Status: History of cognitive impairments - at baseline      General Comments: AxO X1; good eye contact and responds to name but difficulty following commands and resistant.     General Comments  Max edema and pain to touch with bilateral feet.            Home Living Family/patient expects to be discharged to:: Private residence Living Arrangements: Children Available Help at Discharge: Personal care attendant;Available 24 hours/day Type of Home: House Home Access: Stairs to enter [employer] of Steps: 3 Entrance Stairs-Rails: Left           Bathroom Toilet: Standard     Home Equipment: Grab bars - tub/shower;Rolling Walker (2 wheels);Shower seat   Additional Comments: caregiver present, provided information      Prior Functioning/Environment Prior Level of Function : Needs assist  Cognitive Assist : Mobility (cognitive);ADLs (cognitive)     Physical Assist : Mobility (physical);ADLs (physical)   ADLs (physical): Grooming;Bathing;Dressing;Toileting Mobility Comments: Normally will ambulate with RW without assistance. Over the past several days, pt has not been able to ambulate per caregiver report. ADLs Comments: Pt receives total assist for BADL tasks. Caregiver reports that he can do bathing and dressing without physical assist though d/t time constraint, she will complete it for him.        OT Problem List: Decreased strength;Impaired balance (sitting and/or standing)         OT Goals(Current goals can be found in the care plan section) Acute Rehab OT Goals Patient Stated Goal: to not stand  OT Frequency:  1X visit    Co-evaluation PT/OT/SLP Co-Evaluation/Treatment: Yes Reason for Co-Treatment: Necessary to address cognition/behavior during functional activity;For patient/therapist safety PT goals addressed during session: Mobility/safety with mobility OT goals addressed during session: Strengthening/ROM      AM-PAC  OT 6 Clicks Daily Activity     Outcome Measure Help from another person eating meals?: A Lot Help from another person taking care of personal grooming?: Total Help from another person toileting, which includes using toliet, bedpan, or urinal?: Total Help from another person bathing (including washing, rinsing, drying)?: Total Help from another person to put on and taking off regular upper body clothing?: Total Help from another person to put on and taking off regular lower body clothing?: Total 6 Click Score: 7   End of Session Equipment Utilized During Treatment: Rolling walker (2 wheels) Nurse Communication: Mobility status  Activity Tolerance: Other (comment) (patient limited by cognition) Patient left:    OT Visit Diagnosis: Unsteadiness on feet (R26.81);Muscle weakness (generalized) (M62.81);Pain Pain - Right/Left:  (bilateral) Pain - part of body: Ankle and joints of foot                Time: 9063-9044 OT Time Calculation (min): 19 min Charges:  OT General Charges $OT Visit: 1 Visit OT Evaluation $OT Eval Moderate Complexity: 1 Mod  [employer], OTR/L,CBIS  Supplemental OT - [facility] and [facility] Secure Chat Preferred    Kataya Guimont, Leita BIRCH 04/19/2023, 1:02 PM

## 2023-04-19 NOTE — Progress Notes (Signed)
 [facility]  PROGRESS NOTE  CRANDALL HARVEL FMW:[id number] DOB: 1930-12-30 DOA: 04/15/2023 PCP: Katrinka Garnette KIDD, MD   Brief HPI:    89 y.o. male with medical history significant of CAD, type 2 diabetes, dementia, hyperlipidemia who presents emergency department due to altered mental status.  Patient was seen on 12/28.  At that time she was having cough and diarrhea.  Patient caretaker noticed him feeling short of breath and less responsive.  He was brought to the ER due to further assessment.  Home his daughter states they are unable to care for him at home as he is unable to walk.     Assessment/Plan:   Failure to thrive/influenza A -Has baseline cognitive impairment with dementia -Influenza A PCR is positive -Started on Tamiflu  30 mg p.o. twice daily for 5 days  Coronary artery disease -Continue aspirin   Acute on chronic anemia -Hemoglobin is 11.4 , improved from 9.7 yesterday  Hyperlipidemia -Continue Lipitor,   Hypertension -Continue metoprolol   PT evaluation obtained, recommend to go to skilled facility for rehab  Medications     aspirin  EC  81 mg Oral Daily   enoxaparin  (LOVENOX ) injection  40 mg Subcutaneous QHS   metoprolol  succinate  50 mg Oral Daily   oseltamivir   30 mg Oral Q12H     Data Reviewed:   CBG:  No results for input(s): GLUCAP in the last 168 hours.  SpO2: 98 %    Vitals:   04/18/23 0951 04/18/23 1328 04/18/23 1943 04/19/23 0439  BP: 135/78 (!) 149/72 (!) 161/121 117/87  Pulse: 75 61 73 62  Resp:  18 15 15   Temp:  97.8 F (36.6 C) 98.2 F (36.8 C) 98 F (36.7 C)  TempSrc:  Oral    SpO2:  97% 100% 98%  Weight:      Height:          Data Reviewed:  Basic Metabolic Panel: Recent Labs  Lab 04/14/23 1043 04/15/23 1706 04/16/23 0500  NA 134* 135 132*  K 3.8 3.8 3.6  CL 103 105 103  CO2 22 17* 20*  GLUCOSE 135* 153* 160*  BUN 19 18 26*  CREATININE 0.92 0.90 1.17  CALCIUM  9.0 8.0* 8.4*    CBC: Recent Labs   Lab 04/14/23 1043 04/15/23 1706 04/16/23 0500  WBC 7.2 9.8 9.6  NEUTROABS 5.2 8.2*  --   HGB 12.3* 9.7* 11.4*  HCT 37.9* 29.8* 34.2*  MCV 95.0 96.1 95.0  PLT 213 140* 164    LFT Recent Labs  Lab 04/14/23 1043 04/15/23 1706  AST 14* 25  ALT 10 12  ALKPHOS 70 55  BILITOT 0.6 0.7  PROT 7.0 6.6  ALBUMIN 3.4* 3.0*     Antibiotics: Anti-infectives (From admission, onward)    Start     Dose/Rate Route Frequency Ordered Stop   04/15/23 2200  oseltamivir  (TAMIFLU ) capsule 30 mg        30 mg Oral Every 12 hours 04/15/23 1901 04/20/23 2159        DVT prophylaxis: Lovenox   Code Status: Full code  Family Communication: Caregiver at bedside   CONSULTS    Subjective   No new complaints.  Pleasantly confused.   Objective    Physical Examination:  Alert, confused S1-S2, regular Lungs clear to auscultation bilaterally Abdomen is soft, nontender    Status is: Inpatient:      Pressure Injury 04/15/23 Sacrum Stage 2 -  Partial thickness loss of dermis presenting as a shallow open injury with  a red, pink wound bed without slough. (Active)  04/15/23 2039  Location: Sacrum  Location Orientation:   Staging: Stage 2 -  Partial thickness loss of dermis presenting as a shallow open injury with a red, pink wound bed without slough.  Wound Description (Comments):   Present on Admission: Yes        Alieah Brinton S Monchel Pollitt   [facility] If 7PM-7AM, please contact night-coverage at [email or web address], Office  [phone]   04/19/2023, 8:58 AM  LOS: 1 day

## 2023-04-20 DIAGNOSIS — D649 Anemia, unspecified: Secondary | ICD-10-CM | POA: Diagnosis not present

## 2023-04-20 DIAGNOSIS — J111 Influenza due to unidentified influenza virus with other respiratory manifestations: Secondary | ICD-10-CM | POA: Diagnosis not present

## 2023-04-20 DIAGNOSIS — R400 Somnolence: Secondary | ICD-10-CM | POA: Diagnosis not present

## 2023-04-20 LAB — BASIC METABOLIC PANEL
Anion gap: 9 (ref 5–15)
BUN: 18 mg/dL (ref 8–23)
CO2: 22 mmol/L (ref 22–32)
Calcium: 8.8 mg/dL — ABNORMAL LOW (ref 8.9–10.3)
Chloride: 106 mmol/L (ref 98–111)
Creatinine, Ser: 0.76 mg/dL (ref 0.61–1.24)
GFR, Estimated: >60 mL/min (ref >60)
Glucose, Bld: 137 mg/dL — ABNORMAL HIGH (ref 70–99)
Potassium: 3.7 mmol/L (ref 3.5–5.1)
Sodium: 137 mmol/L (ref 135–145)

## 2023-04-20 LAB — CBC
HCT: 34.7 % — ABNORMAL LOW (ref 39.0–52.0)
Hemoglobin: 11.5 g/dL — ABNORMAL LOW (ref 13.0–17.0)
MCH: 30.6 pg (ref 26.0–34.0)
MCHC: 33.1 g/dL (ref 30.0–36.0)
MCV: 92.3 fL (ref 80.0–100.0)
Platelets: 217 10*3/uL (ref 150–400)
RBC: 3.76 MIL/uL — ABNORMAL LOW (ref 4.22–5.81)
RDW: 13.5 % (ref 11.5–15.5)
WBC: 7 10*3/uL (ref 4.0–10.5)
nRBC: 0 % (ref 0.0–0.2)

## 2023-04-20 NOTE — TOC Initial Note (Signed)
 Transition of Care [facility]) - Initial/Assessment Note    Patient Details  Name: Steven [name] MRN: [id number] Date of Birth: 1930/09/20  Transition of Care [facility]) CM/SW Contact:    Sonda Manuella Quill, RN Phone Number: 04/20/2023, 9:45 AM  Clinical Narrative:                 WONDA for d/c planning; PT recc SNF; contacted pt's dtr Olam Sharps [phone]); she says pt lives at home; he has 24/7 caregivers; she plans for him to d/c to SNF; she denies pt experiencing SDOH risks; Ms Mirabal verified pt has PCP/insurance; pt has walker, transport chair, wheelchair, and chairlift; pt does not have HH services or home oxygen; explained SNF process, and ins auth; Ms Streett verbalized understanding; she does not have a facility preference; she also does not want bed search limited to [facility]; will initiate SNF process.  Expected Discharge Plan: Skilled Nursing Facility Barriers to Discharge: Continued Medical Work up   Patient Goals and CMS Choice Patient states their goals for this hospitalization and ongoing recovery are:: pt's dtr Nobuo Nunziata says he will d/c to SNF CMS Medicare.gov Compare Post Acute Care list provided to:: Patient Represenative (must comment) [name] (dtr))   [facility] ownership interest in [facility].provided to:: Patient    Expected Discharge Plan and Services   Discharge Planning Services: CM Consult Post Acute Care Choice: Skilled Nursing Facility Living arrangements for the past 2 months: Single Family Home                                      Prior Living Arrangements/Services Living arrangements for the past 2 months: Single Family Home Lives with:: Adult Children Patient language and need for interpreter reviewed:: Yes Do you feel safe going back to the place where you live?: Yes      Need for Family Participation in Patient Care: Yes (Comment) Care giver support system in place?: Yes (comment) Current home services: DME (walker,  wheelchair, transport chair, chair lift) Criminal Activity/Legal Involvement Pertinent to Current Situation/Hospitalization: No - Comment as needed  Activities of Daily Living   ADL Screening (condition at time of admission) Independently performs ADLs?: No Does the patient have a NEW difficulty with bathing/dressing/toileting/self-feeding that is expected to last >3 days?: Yes (Initiates electronic notice to provider for possible OT consult) Does the patient have a NEW difficulty with getting in/out of bed, walking, or climbing stairs that is expected to last >3 days?: Yes (Initiates electronic notice to provider for possible PT consult) Does the patient have a NEW difficulty with communication that is expected to last >3 days?: Yes (Initiates electronic notice to provider for possible SLP consult) Is the patient deaf or have difficulty hearing?: No Does the patient have difficulty seeing, even when wearing glasses/contacts?: No Does the patient have difficulty concentrating, remembering, or making decisions?: Yes  Permission Sought/Granted Permission sought to share information with : Case Manager Permission granted to share information with : Yes, Verbal Permission Granted  Share Information with NAME: Case Manager     Permission granted to share info w Relationship: Shadow Schedler (dtr) [phone]     Emotional Assessment Appearance:: Other (Comment Required (unable to assess) Attitude/Demeanor/Rapport: Unable to Assess Affect (typically observed): Unable to Assess Orientation: :  (unable to assess) Alcohol  / Substance Use: Not Applicable Psych Involvement: No (comment)  Admission diagnosis:  Somnolence [R40.0] Thrombocytopenia (  HCC) [D69.6] Influenza A [J10.1] Generalized weakness [R53.1] Influenza [J11.1] Anemia, unspecified type [D64.9] Patient Active Problem List   Diagnosis Date Noted   Influenza A 04/15/2023   Aortic atherosclerosis (HCC) 08/30/2022   Dementia (HCC)  08/30/2021   BPH (benign prostatic hyperplasia) 09/18/2019   AAA (abdominal aortic aneurysm) (HCC) 12/13/2018   Hx of CABG 12/13/2017   PVC's (premature ventricular contractions) 12/13/2017   Seborrheic dermatitis 09/13/2017   Falls frequently 08/21/2017   Aortic root dilatation (HCC) 10/06/2016   History of acute inferior wall MI 09/03/2015   Osteoporosis 01/07/2015   Anemia 09/03/2013   Essential hypertension, benign 10/11/2012   Foot drop, left 08/23/2012   History of supraventricular tachycardia 01/25/2011   Hypogonadism in male 02/04/2010   History of colonic polyps 09/07/2009   CAD, ARTERY BYPASS GRAFT 05/21/2009   Carotid artery stenosis 04/20/2008   Diabetes mellitus type II, controlled (HCC) 04/19/2007   Hyperlipidemia 04/19/2007   GERD 04/19/2007   PCP:  Katrinka Garnette KIDD, MD Pharmacy:   [id number] - [place], [place] - [address]  [place] [address] Phone: [phone] Fax: [phone]     Social Drivers of Health (SDOH) Social History: SDOH Screenings   Food Insecurity: No Food Insecurity (04/20/2023)  Housing: Low Risk  (04/20/2023)  Transportation Needs: No Transportation Needs (04/20/2023)  Utilities: Not At Risk (04/20/2023)  Depression (PHQ2-9): Low Risk  (12/25/2022)  Social Connections: Unknown (04/16/2023)  Tobacco Use: Low Risk  (04/16/2023)   SDOH Interventions: Food Insecurity Interventions: Intervention Not Indicated, Inpatient TOC Housing Interventions: Intervention Not Indicated, Inpatient TOC Transportation Interventions: Intervention Not Indicated, Inpatient TOC Utilities Interventions: Intervention Not Indicated, Inpatient TOC Social Connections Interventions: Intervention Not Indicated   Readmission Risk Interventions     No data to display

## 2023-04-20 NOTE — Plan of Care (Signed)
  Problem: Education: Goal: Knowledge of General Education information will improve Description: Including pain rating scale, medication(s)/side effects and non-pharmacologic comfort measures Outcome: Progressing   Problem: Clinical Measurements: Goal: Ability to maintain clinical measurements within normal limits will improve Outcome: Progressing Goal: Will remain free from infection Outcome: Progressing   Problem: Nutrition: Goal: Adequate nutrition will be maintained Outcome: Progressing   Problem: Pain Management: Goal: General experience of comfort will improve Outcome: Progressing   Problem: Safety: Goal: Ability to remain free from injury will improve Outcome: Progressing   Problem: Skin Integrity: Goal: Risk for impaired skin integrity will decrease Outcome: Progressing

## 2023-04-20 NOTE — [place] FL2 (Signed)
 [place]  MEDICAID FL2 LEVEL OF CARE FORM     IDENTIFICATION  Patient Name: Steven [name] Birthdate: Aug 20, 1930 Sex: male Admission Date (Current Location): 04/15/2023  [facility] and [place] Number:  Producer, Television/film/video and Address:  [facility],  [address] [place], [place] [address]      Provider Number: [id number]  Attending Physician Name and Address:  Drusilla Sabas GORMAN, MD  Relative Name and Phone Number:  Jovian Lembcke (dtr) [phone]    Current Level of Care: Hospital Recommended Level of Care: Skilled Nursing Facility Prior Approval Number:    Date Approved/Denied:   PASRR Number: [id number]  Discharge Plan: SNF    Current Diagnoses: Patient Active Problem List   Diagnosis Date Noted   Influenza A 04/15/2023   Aortic atherosclerosis (HCC) 08/30/2022   Dementia (HCC) 08/30/2021   BPH (benign prostatic hyperplasia) 09/18/2019   AAA (abdominal aortic aneurysm) (HCC) 12/13/2018   Hx of CABG 12/13/2017   PVC's (premature ventricular contractions) 12/13/2017   Seborrheic dermatitis 09/13/2017   Falls frequently 08/21/2017   Aortic root dilatation (HCC) 10/06/2016   History of acute inferior wall MI 09/03/2015   Osteoporosis 01/07/2015   Anemia 09/03/2013   Essential hypertension, benign 10/11/2012   Foot drop, left 08/23/2012   History of supraventricular tachycardia 01/25/2011   Hypogonadism in male 02/04/2010   History of colonic polyps 09/07/2009   CAD, ARTERY BYPASS GRAFT 05/21/2009   Carotid artery stenosis 04/20/2008   Diabetes mellitus type II, controlled (HCC) 04/19/2007   Hyperlipidemia 04/19/2007   GERD 04/19/2007    Orientation RESPIRATION BLADDER Height & Weight     Self  Normal External catheter Weight: 66.5 kg Height:  5' 9 (175.3 cm)  BEHAVIORAL SYMPTOMS/MOOD NEUROLOGICAL BOWEL NUTRITION STATUS      Incontinent Diet (regular diet)  AMBULATORY STATUS COMMUNICATION OF NEEDS Skin   Total Care Verbally Other (Comment), Bruising,  PU Stage and Appropriate Care (ecchymosis to bilateral arms and legs; erythema to bilateral buttocks)   PU Stage 2 Dressing: Daily (partial thickness loss of dermis presenting as shallow open injury with a red pink wound bed without slough)                   Personal Care Assistance Level of Assistance  Bathing, Feeding, Dressing Bathing Assistance: Maximum assistance Feeding assistance: Maximum assistance Dressing Assistance: Maximum assistance     Functional Limitations Info  Sight, Hearing, Speech Sight Info: Impaired Hearing Info: Impaired Speech Info: Adequate    SPECIAL CARE FACTORS FREQUENCY  PT (By licensed PT), OT (By licensed OT)     PT Frequency: 5x/week OT Frequency: 5x/week            Contractures Contractures Info: Not present    Additional Factors Info  Code Status, Allergies, Isolation Precautions Code Status Info: Full Allergies Info: Alendronate , Metformin  And Related     Isolation Precautions Info: droplet precautions     Current Medications (04/20/2023):  This is the current hospital active medication list Current Facility-Administered Medications  Medication Dose Route Frequency Provider Last Rate Last Admin   acetaminophen  (TYLENOL ) tablet 650 mg  650 mg Oral Q6H PRN Dena Charleston, MD   650 mg at 04/18/23 2033   Or   acetaminophen  (TYLENOL ) suppository 650 mg  650 mg Rectal Q6H PRN Dena Charleston, MD       aspirin  EC tablet 81 mg  81 mg Oral Daily Dena Charleston, MD   81 mg at 04/20/23 0939   enoxaparin  (LOVENOX )  injection 40 mg  40 mg Subcutaneous QHS Dena Charleston, MD   40 mg at 04/19/23 2140   guaiFENesin -dextromethorphan  (ROBITUSSIN DM) 100-10 MG/5ML syrup 5 mL  5 mL Oral Q4H PRN Lama, Gagan S, MD   5 mL at 04/19/23 2140   metoprolol  succinate (TOPROL -XL) 24 hr tablet 50 mg  50 mg Oral Daily Dorrell, Robert, MD   50 mg at 04/20/23 9060   ondansetron  (ZOFRAN ) tablet 4 mg  4 mg Oral Q6H PRN Dena Charleston, MD       Or   ondansetron   (ZOFRAN ) injection 4 mg  4 mg Intravenous Q6H PRN Dena Charleston, MD         Discharge Medications: Please see discharge summary for a list of discharge medications.  Relevant Imaging Results:  Relevant Lab Results:   Additional Information SSN [id number]  Sonda Manuella Quill, RN

## 2023-04-20 NOTE — Progress Notes (Signed)
 [facility]  PROGRESS NOTE  Steven [name] FMW:[id number] DOB: 12-17-1930 DOA: 04/15/2023 PCP: Katrinka Garnette KIDD, MD   Brief HPI:    89 y.o. male with medical history significant of CAD, type 2 diabetes, dementia, hyperlipidemia who presents emergency department due to altered mental status.  Patient was seen on 12/28.  At that time she was having cough and diarrhea.  Patient caretaker noticed him feeling short of breath and less responsive.  He was brought to the ER due to further assessment.  Home his daughter states they are unable to care for him at home as he is unable to walk.     Assessment/Plan:   Failure to thrive/influenza A -Has baseline cognitive impairment with dementia -Influenza A PCR is positive -Started on Tamiflu  30 mg p.o. twice daily for 5 days  Coronary artery disease -Continue aspirin   Acute on chronic anemia -Hemoglobin is 11.5 -Stable  Hyperlipidemia -Continue Lipitor,   Hypertension -Continue metoprolol   PT evaluation obtained, recommend to go to skilled facility for rehab  Medications     aspirin  EC  81 mg Oral Daily   enoxaparin  (LOVENOX ) injection  40 mg Subcutaneous QHS   metoprolol  succinate  50 mg Oral Daily   oseltamivir   30 mg Oral Q12H     Data Reviewed:   CBG:  No results for input(s): GLUCAP in the last 168 hours.  SpO2: 92 % O2 Flow Rate (L/min): 2 L/min    Vitals:   04/19/23 0439 04/19/23 1229 04/19/23 1936 04/20/23 0419  BP: 117/87 117/77 (!) 154/50 130/73  Pulse: 62 66 67 (!) 103  Resp: 15 18 20 16   Temp: 98 F (36.7 C) 98 F (36.7 C) 97.7 F (36.5 C) 97.9 F (36.6 C)  TempSrc:  Oral Oral   SpO2: 98% 97%  92%  Weight:      Height:          Data Reviewed:  Basic Metabolic Panel: Recent Labs  Lab 04/14/23 1043 04/15/23 1706 04/16/23 0500 04/20/23 0542  NA 134* 135 132* 137  K 3.8 3.8 3.6 3.7  CL 103 105 103 106  CO2 22 17* 20* 22  GLUCOSE 135* 153* 160* 137*  BUN 19 18 26* 18  CREATININE  0.92 0.90 1.17 0.76  CALCIUM  9.0 8.0* 8.4* 8.8*    CBC: Recent Labs  Lab 04/14/23 1043 04/15/23 1706 04/16/23 0500 04/20/23 0542  WBC 7.2 9.8 9.6 7.0  NEUTROABS 5.2 8.2*  --   --   HGB 12.3* 9.7* 11.4* 11.5*  HCT 37.9* 29.8* 34.2* 34.7*  MCV 95.0 96.1 95.0 92.3  PLT 213 140* 164 217    LFT Recent Labs  Lab 04/14/23 1043 04/15/23 1706  AST 14* 25  ALT 10 12  ALKPHOS 70 55  BILITOT 0.6 0.7  PROT 7.0 6.6  ALBUMIN 3.4* 3.0*     Antibiotics: Anti-infectives (From admission, onward)    Start     Dose/Rate Route Frequency Ordered Stop   04/15/23 2200  oseltamivir  (TAMIFLU ) capsule 30 mg        30 mg Oral Every 12 hours 04/15/23 1901 04/20/23 2159        DVT prophylaxis: Lovenox   Code Status: Full code  Family Communication: Caregiver at bedside   CONSULTS    Subjective   No new complaints.   Objective    Physical Examination:  Appears in no acute distress Pleasantly confused Lungs clear to auscultation bilaterally Abdomen is soft, nontender, no organomegaly    Status  is: Inpatient:      Pressure Injury 04/15/23 Sacrum Stage 2 -  Partial thickness loss of dermis presenting as a shallow open injury with a red, pink wound bed without slough. (Active)  04/15/23 2039  Location: Sacrum  Location Orientation:   Staging: Stage 2 -  Partial thickness loss of dermis presenting as a shallow open injury with a red, pink wound bed without slough.  Wound Description (Comments):   Present on Admission: Yes        [name] S [name]   [facility] If 7PM-7AM, please contact night-coverage at [email or web address], Office  [phone]   04/20/2023, 8:59 AM  LOS: 2 days

## 2023-04-20 NOTE — Plan of Care (Signed)

## 2023-04-21 DIAGNOSIS — J101 Influenza due to other identified influenza virus with other respiratory manifestations: Secondary | ICD-10-CM | POA: Diagnosis not present

## 2023-04-21 LAB — CBC
HCT: 38.5 % — ABNORMAL LOW (ref 39.0–52.0)
Hemoglobin: 12.5 g/dL — ABNORMAL LOW (ref 13.0–17.0)
MCH: 30.3 pg (ref 26.0–34.0)
MCHC: 32.5 g/dL (ref 30.0–36.0)
MCV: 93.2 fL (ref 80.0–100.0)
Platelets: 280 10*3/uL (ref 150–400)
RBC: 4.13 MIL/uL — ABNORMAL LOW (ref 4.22–5.81)
RDW: 13.4 % (ref 11.5–15.5)
WBC: 7.1 10*3/uL (ref 4.0–10.5)
nRBC: 0 % (ref 0.0–0.2)

## 2023-04-21 LAB — GLUCOSE, CAPILLARY
Glucose-Capillary: 226 mg/dL — ABNORMAL HIGH (ref 70–99)
Glucose-Capillary: 116 mg/dL — ABNORMAL HIGH (ref 70–99)
Glucose-Capillary: 156 mg/dL — ABNORMAL HIGH (ref 70–99)

## 2023-04-21 MED ORDER — INSULIN ASPART 100 UNIT/ML IJ SOLN
0.0000 [IU] | Freq: Three times a day (TID) | INTRAMUSCULAR | Status: DC
  Administered 2023-04-21: 3 [IU] via SUBCUTANEOUS
  Administered 2023-04-22: 1 [IU] via SUBCUTANEOUS
  Administered 2023-04-22: 2 [IU] via SUBCUTANEOUS
  Administered 2023-04-23: 1 [IU] via SUBCUTANEOUS
  Administered 2023-04-23: 2 [IU] via SUBCUTANEOUS
  Administered 2023-04-24: 1 [IU] via SUBCUTANEOUS

## 2023-04-21 MED ORDER — INSULIN ASPART 100 UNIT/ML IJ SOLN: 0.0000 [IU] | Freq: Every day | INTRAMUSCULAR | Status: DC

## 2023-04-21 MED ORDER — GLUCAGON HCL RDNA (DIAGNOSTIC) 1 MG IJ SOLR: 1.0000 mg | INTRAMUSCULAR | Status: DC | PRN

## 2023-04-21 MED ORDER — HYDRALAZINE HCL 20 MG/ML IJ SOLN: 10.0000 mg | INTRAMUSCULAR | Status: DC | PRN

## 2023-04-21 MED ORDER — METOPROLOL TARTRATE 5 MG/5ML IV SOLN: 5.0000 mg | INTRAVENOUS | Status: DC | PRN

## 2023-04-21 MED ORDER — IPRATROPIUM-ALBUTEROL 0.5-2.5 (3) MG/3ML IN SOLN: 3.0000 mL | RESPIRATORY_TRACT | Status: DC | PRN

## 2023-04-21 MED ORDER — SENNOSIDES-DOCUSATE SODIUM 8.6-50 MG PO TABS
1.0000 | ORAL_TABLET | Freq: Every evening | ORAL | Status: DC | PRN
  Administered 2023-04-21: 1 via ORAL
  Filled 2023-04-21: qty 1

## 2023-04-21 NOTE — Progress Notes (Signed)
 PROGRESS NOTE    Steven [name]  FMW:[id number] DOB: 1931/02/24 DOA: 04/15/2023 PCP: Katrinka Garnette KIDD, MD    Brief Narrative:  89 year old with history of CAD, DM2, dementia, HLD comes to the ED with change in mental status.  Upon admission noted to have adult failure to thrive and was diagnosed with influenza and started on Tamiflu .  PT/OT recommended SNF.   Assessment & Plan:  Principal Problem:   Influenza A    Adult failure to thrive/influenza A Cognitive impairment with dementia Initially patient diagnosed with influenza.  Treated with 5 days of Tamiflu .  Completed course on 1/3.  Due to generalized weakness PT/OT is recommended SNF.   Coronary artery disease Currently chest pain-free.  Continue aspirin , Toprol -XL   Acute on chronic anemia -Hemoglobin is 11.5 -Stable   Hyperlipidemia Will resume Lipitor upon discharge  Diabetes mellitus type 2 - Sliding scale and Accu-Cheks.  Metformin  on hold   Hypertension -Continue metoprolol .  IV as needed   PT/OT-SNF.  TOC consulted     DVT prophylaxis: enoxaparin  (LOVENOX ) injection 40 mg Start: 04/15/23 2200 SCDs Start: 04/15/23 2000 SCDs Start: 04/15/23 1901      Code Status: Full Code Family Communication: Caregiver at bedside Status is: Inpatient Remains inpatient appropriate because: SNF placement    Subjective: Only confused, no complaints.  Off-and-on still having some coughing   Examination:  General exam: Appears calm and comfortable  Respiratory system: Clear to auscultation. Respiratory effort normal. Cardiovascular system: S1 & S2 heard, RRR. No JVD, murmurs, rubs, gallops or clicks. No pedal edema. Gastrointestinal system: Abdomen is nondistended, soft and nontender. No organomegaly or masses felt. Normal bowel sounds heard. Central nervous system: Alert and oriented to name only, baseline. No focal neurological deficits. Extremities: Symmetric 5 x 5 power. Skin: No rashes, lesions or  ulcers Psychiatry: Judgement and insight appear poor            Pressure Injury 04/15/23 Sacrum Stage 2 -  Partial thickness loss of dermis presenting as a shallow open injury with a red, pink wound bed without slough. (Active)  04/15/23 2039  Location: Sacrum  Location Orientation:   Staging: Stage 2 -  Partial thickness loss of dermis presenting as a shallow open injury with a red, pink wound bed without slough.  Wound Description (Comments):   Present on Admission: Yes     Diet Orders (From admission, onward)     Start     Ordered   04/15/23 2000  Diet regular Room service appropriate? Yes; Fluid consistency: Thin  Diet effective now       Question Answer Comment  Room service appropriate? Yes   Fluid consistency: Thin      04/15/23 2000            Objective: Vitals:   04/20/23 0419 04/20/23 1209 04/20/23 1958 04/21/23 0526  BP: 130/73 123/72 137/85 (!) 133/104  Pulse: (!) 103 62 69 61  Resp: 16 18 (!) 24 20  Temp: 97.9 F (36.6 C) 98.1 F (36.7 C) 98.7 F (37.1 C) 98.3 F (36.8 C)  TempSrc:  Oral    SpO2: 92% 98% 96% 99%  Weight:      Height:        Intake/Output Summary (Last 24 hours) at 04/21/2023 1048 Last data filed at 04/21/2023 0430 Gross per 24 hour  Intake 120 ml  Output 325 ml  Net -205 ml   Filed Weights   04/16/23 0629  Weight: 66.5 kg  Scheduled Meds:  aspirin  EC  81 mg Oral Daily   enoxaparin  (LOVENOX ) injection  40 mg Subcutaneous QHS   insulin  aspart  0-5 Units Subcutaneous QHS   insulin  aspart  0-9 Units Subcutaneous TID WC   metoprolol  succinate  50 mg Oral Daily   Continuous Infusions:  Nutritional status     Body mass index is 21.65 kg/m.  Data Reviewed:   CBC: Recent Labs  Lab 04/15/23 1706 04/16/23 0500 04/20/23 0542 04/21/23 0848  WBC 9.8 9.6 7.0 7.1  NEUTROABS 8.2*  --   --   --   HGB 9.7* 11.4* 11.5* 12.5*  HCT 29.8* 34.2* 34.7* 38.5*  MCV 96.1 95.0 92.3 93.2  PLT 140* 164 217 280   Basic  Metabolic Panel: Recent Labs  Lab 04/15/23 1706 04/16/23 0500 04/20/23 0542  NA 135 132* 137  K 3.8 3.6 3.7  CL 105 103 106  CO2 17* 20* 22  GLUCOSE 153* 160* 137*  BUN 18 26* 18  CREATININE 0.90 1.17 0.76  CALCIUM  8.0* 8.4* 8.8*   GFR: Estimated Creatinine Clearance: 57.7 mL/min (by C-G formula based on SCr of 0.76 mg/dL). Liver Function Tests: Recent Labs  Lab 04/15/23 1706  AST 25  ALT 12  ALKPHOS 55  BILITOT 0.7  PROT 6.6  ALBUMIN 3.0*   No results for input(s): LIPASE, AMYLASE in the last 168 hours. No results for input(s): AMMONIA in the last 168 hours. Coagulation Profile: No results for input(s): INR, PROTIME in the last 168 hours. Cardiac Enzymes: No results for input(s): CKTOTAL, CKMB, CKMBINDEX, TROPONINI in the last 168 hours. BNP (last 3 results) No results for input(s): PROBNP in the last 8760 hours. HbA1C: No results for input(s): HGBA1C in the last 72 hours. CBG: No results for input(s): GLUCAP in the last 168 hours. Lipid Profile: No results for input(s): CHOL, HDL, LDLCALC, TRIG, CHOLHDL, LDLDIRECT in the last 72 hours. Thyroid  Function Tests: No results for input(s): TSH, T4TOTAL, FREET4, T3FREE, THYROIDAB in the last 72 hours. Anemia Panel: No results for input(s): VITAMINB12, FOLATE, FERRITIN, TIBC, IRON , RETICCTPCT in the last 72 hours. Sepsis Labs: No results for input(s): PROCALCITON, LATICACIDVEN in the last 168 hours.  Recent Results (from the past 240 hours)  Resp panel by RT-PCR (RSV, Flu A&B, Covid) Anterior Nasal Swab     Status: Abnormal   Collection Time: 04/14/23  9:56 AM   Specimen: Anterior Nasal Swab  Result Value Ref Range Status   SARS Coronavirus 2 by RT PCR NEGATIVE NEGATIVE Final    Comment: (NOTE) SARS-CoV-2 target nucleic acids are NOT DETECTED.  The SARS-CoV-2 RNA is generally detectable in upper respiratory specimens during the acute phase of infection.  The lowest concentration of SARS-CoV-2 viral copies this assay can detect is 138 copies/mL. A negative result does not preclude SARS-Cov-2 infection and should not be used as the sole basis for treatment or other patient management decisions. A negative result may occur with  improper specimen collection/handling, submission of specimen other than nasopharyngeal swab, presence of viral mutation(s) within the areas targeted by this assay, and inadequate number of viral copies(<138 copies/mL). A negative result must be combined with clinical observations, patient history, and epidemiological information. The expected result is Negative.  Fact Sheet for Patients:  bloggercourse.com  Fact Sheet for Healthcare Providers:  seriousbroker.it  This test is no t yet approved or cleared by the [place]  FDA and  has been authorized for detection and/or diagnosis of SARS-CoV-2 by FDA under an  Emergency Use Authorization (EUA). This EUA will remain  in effect (meaning this test can be used) for the duration of the COVID-19 declaration under Section 564(b)(1) of the Act, 21 U.S.C.section 360bbb-3(b)(1), unless the authorization is terminated  or revoked sooner.       Influenza A by PCR POSITIVE (A) NEGATIVE Final   Influenza B by PCR NEGATIVE NEGATIVE Final    Comment: (NOTE) The Xpert Xpress SARS-CoV-2/FLU/RSV plus assay is intended as an aid in the diagnosis of influenza from Nasopharyngeal swab specimens and should not be used as a sole basis for treatment. Nasal washings and aspirates are unacceptable for Xpert Xpress SARS-CoV-2/FLU/RSV testing.  Fact Sheet for Patients: bloggercourse.com  Fact Sheet for Healthcare Providers: seriousbroker.it  This test is not yet approved or cleared by the [place]  FDA and has been authorized for detection and/or diagnosis of SARS-CoV-2  by FDA under an Emergency Use Authorization (EUA). This EUA will remain in effect (meaning this test can be used) for the duration of the COVID-19 declaration under Section 564(b)(1) of the Act, 21 U.S.C. section 360bbb-3(b)(1), unless the authorization is terminated or revoked.     Resp Syncytial Virus by PCR NEGATIVE NEGATIVE Final    Comment: (NOTE) Fact Sheet for Patients: bloggercourse.com  Fact Sheet for Healthcare Providers: seriousbroker.it  This test is not yet approved or cleared by the [place]  FDA and has been authorized for detection and/or diagnosis of SARS-CoV-2 by FDA under an Emergency Use Authorization (EUA). This EUA will remain in effect (meaning this test can be used) for the duration of the COVID-19 declaration under Section 564(b)(1) of the Act, 21 U.S.C. section 360bbb-3(b)(1), unless the authorization is terminated or revoked.  Performed at [facility], [address] [place], [place] [address]          Radiology Studies: No results found.         LOS: 3 days   Time spent= 35 mins    Burgess JAYSON Dare, MD [facility]  If 7PM-7AM, please contact night-coverage  04/21/2023, 10:48 AM

## 2023-04-21 NOTE — Hospital Course (Addendum)
 Brief Narrative:  89 year old with history of CAD, DM2, dementia, HLD comes to the ED with change in mental status.  Upon admission noted to have adult failure to thrive and was diagnosed with influenza and started on Tamiflu .  PT/OT recommended SNF. After waiting several day, he received SNF approval.    Assessment & Plan:  Principal Problem:   Influenza A    Adult failure to thrive/influenza A Cognitive impairment with dementia Initially patient diagnosed with influenza.  Treated with 5 days of Tamiflu .  Completed course on 1/3.  Due to generalized weakness PT/OT is recommended SNF.   Coronary artery disease Currently chest pain-free.  Continue aspirin , Toprol -XL  Right foot swelling - X-ray negative for any acute pathology/fracture.  There is some soft tissue swelling.  Continue to monitor this. Rest, icu, compression bandage and elevate   Acute on chronic anemia -Hemoglobin is 11.5 -Stable   Hyperlipidemia Will resume Lipitor upon discharge  Diabetes mellitus type 2 - resume metformin  upon dc   Hypertension -Continue metoprolol .     PT/OT-SNF.    DVT prophylaxis: enoxaparin  (LOVENOX ) injection 40 mg Start: 04/15/23 2200 SCDs Start: 04/15/23 2000 SCDs Start: 04/15/23 1901      Code Status: Full Code Family Communication: Caregiver at bedside Status is: Inpatient Remains inpatient appropriate because: SNF placement  Subjective: No new complaints  Examination:  General exam: Appears calm and comfortable, elderly frail Respiratory system: Clear to auscultation. Respiratory effort normal. Cardiovascular system: S1 & S2 heard, RRR. No JVD, murmurs, rubs, gallops or clicks. No pedal edema. Gastrointestinal system: Abdomen is nondistended, soft and nontender. No organomegaly or masses felt. Normal bowel sounds heard. Central nervous system: Alert and oriented to name only, baseline. No focal neurological deficits. Extremities: Symmetric 5 x 5 power. Skin: No  rashes, lesions or ulcers Psychiatry: Judgement and insight appear poor

## 2023-04-21 NOTE — Plan of Care (Signed)

## 2023-04-21 NOTE — Plan of Care (Signed)
  Problem: Education: Goal: Knowledge of General Education information will improve Description: Including pain rating scale, medication(s)/side effects and non-pharmacologic comfort measures Outcome: Not Progressing   Problem: Health Behavior/Discharge Planning: Goal: Ability to manage health-related needs will improve Outcome: Not Progressing   Problem: Clinical Measurements: Goal: Ability to maintain clinical measurements within normal limits will improve Outcome: Progressing Goal: Will remain free from infection Outcome: Progressing Goal: Diagnostic test results will improve Outcome: Progressing Goal: Respiratory complications will improve Outcome: Progressing Goal: Cardiovascular complication will be avoided Outcome: Progressing   Problem: Nutrition: Goal: Adequate nutrition will be maintained Outcome: Progressing   Problem: Coping: Goal: Level of anxiety will decrease Outcome: Progressing   Problem: Elimination: Goal: Will not experience complications related to bowel motility Outcome: Progressing Goal: Will not experience complications related to urinary retention Outcome: Progressing   Problem: Pain Management: Goal: General experience of comfort will improve Outcome: Progressing   Problem: Safety: Goal: Ability to remain free from injury will improve Outcome: Progressing   Problem: Skin Integrity: Goal: Risk for impaired skin integrity will decrease Outcome: Progressing

## 2023-04-22 DIAGNOSIS — J101 Influenza due to other identified influenza virus with other respiratory manifestations: Secondary | ICD-10-CM | POA: Diagnosis not present

## 2023-04-22 LAB — BASIC METABOLIC PANEL
Anion gap: 7 (ref 5–15)
BUN: 19 mg/dL (ref 8–23)
CO2: 24 mmol/L (ref 22–32)
Calcium: 8.7 mg/dL — ABNORMAL LOW (ref 8.9–10.3)
Chloride: 103 mmol/L (ref 98–111)
Creatinine, Ser: 0.86 mg/dL (ref 0.61–1.24)
GFR, Estimated: >60 mL/min (ref >60)
Glucose, Bld: 151 mg/dL — ABNORMAL HIGH (ref 70–99)
Potassium: 4.1 mmol/L (ref 3.5–5.1)
Sodium: 134 mmol/L — ABNORMAL LOW (ref 135–145)

## 2023-04-22 LAB — GLUCOSE, CAPILLARY
Glucose-Capillary: 150 mg/dL — ABNORMAL HIGH (ref 70–99)
Glucose-Capillary: 156 mg/dL — ABNORMAL HIGH (ref 70–99)
Glucose-Capillary: 109 mg/dL — ABNORMAL HIGH (ref 70–99)
Glucose-Capillary: 184 mg/dL — ABNORMAL HIGH (ref 70–99)

## 2023-04-22 LAB — MAGNESIUM: Magnesium: 1.9 mg/dL (ref 1.7–2.4)

## 2023-04-22 MED ORDER — POLYETHYLENE GLYCOL 3350 17 G PO PACK
17.0000 g | PACK | Freq: Once | ORAL | Status: AC | PRN
  Administered 2023-04-23: 17 g via ORAL
  Filled 2023-04-22: qty 1

## 2023-04-22 NOTE — Plan of Care (Signed)

## 2023-04-22 NOTE — Progress Notes (Signed)
 PROGRESS NOTE    Steven [name]  FMW:[id number] DOB: 1930-11-04 DOA: 04/15/2023 PCP: Katrinka Garnette KIDD, MD    Brief Narrative:  89 year old with history of CAD, DM2, dementia, HLD comes to the ED with change in mental status.  Upon admission noted to have adult failure to thrive and was diagnosed with influenza and started on Tamiflu .  PT/OT recommended SNF.   Assessment & Plan:  Principal Problem:   Influenza A    Adult failure to thrive/influenza A Cognitive impairment with dementia Initially patient diagnosed with influenza.  Treated with 5 days of Tamiflu .  Completed course on 1/3.  Due to generalized weakness PT/OT is recommended SNF.   Coronary artery disease Currently chest pain-free.  Continue aspirin , Toprol -XL   Acute on chronic anemia -Hemoglobin is 11.5 -Stable   Hyperlipidemia Will resume Lipitor upon discharge  Diabetes mellitus type 2 - Sliding scale and Accu-Cheks.  Metformin  on hold; resume upon dc.    Hypertension -Continue metoprolol .  IV as needed   PT/OT-SNF.  TOC consulted    DVT prophylaxis: enoxaparin  (LOVENOX ) injection 40 mg Start: 04/15/23 2200 SCDs Start: 04/15/23 2000 SCDs Start: 04/15/23 1901      Code Status: Full Code Family Communication: Caregiver at bedside Status is: Inpatient Remains inpatient appropriate because: SNF placement  Subjective: Doing okay, poor oral intake Caregiver at bedside  Examination:  General exam: Appears calm and comfortable, elderly frail Respiratory system: Clear to auscultation. Respiratory effort normal. Cardiovascular system: S1 & S2 heard, RRR. No JVD, murmurs, rubs, gallops or clicks. No pedal edema. Gastrointestinal system: Abdomen is nondistended, soft and nontender. No organomegaly or masses felt. Normal bowel sounds heard. Central nervous system: Alert and oriented to name only, baseline. No focal neurological deficits. Extremities: Symmetric 5 x 5 power. Skin: No rashes, lesions or  ulcers Psychiatry: Judgement and insight appear poor            Pressure Injury 04/15/23 Sacrum Stage 2 -  Partial thickness loss of dermis presenting as a shallow open injury with a red, pink wound bed without slough. (Active)  04/15/23 2039  Location: Sacrum  Location Orientation:   Staging: Stage 2 -  Partial thickness loss of dermis presenting as a shallow open injury with a red, pink wound bed without slough.  Wound Description (Comments):   Present on Admission: Yes     Diet Orders (From admission, onward)     Start     Ordered   04/15/23 2000  Diet regular Room service appropriate? Yes; Fluid consistency: Thin  Diet effective now       Question Answer Comment  Room service appropriate? Yes   Fluid consistency: Thin      04/15/23 2000            Objective: Vitals:   04/21/23 0526 04/21/23 1756 04/21/23 2038 04/22/23 0422  BP: (!) 133/104 128/60 135/84 (!) 139/103  Pulse: 61 64 87 75  Resp: 20 20 16 16   Temp: 98.3 F (36.8 C) (!) 97.1 F (36.2 C) (!) 97.5 F (36.4 C) 97.8 F (36.6 C)  TempSrc:      SpO2: 99% 100% 94% 100%  Weight:      Height:        Intake/Output Summary (Last 24 hours) at 04/22/2023 1046 Last data filed at 04/22/2023 9366 Gross per 24 hour  Intake 426 ml  Output 850 ml  Net -424 ml   Filed Weights   04/16/23 0629  Weight: 66.5 kg  Scheduled Meds:  aspirin  EC  81 mg Oral Daily   enoxaparin  (LOVENOX ) injection  40 mg Subcutaneous QHS   insulin  aspart  0-5 Units Subcutaneous QHS   insulin  aspart  0-9 Units Subcutaneous TID WC   metoprolol  succinate  50 mg Oral Daily   Continuous Infusions:  Nutritional status     Body mass index is 21.65 kg/m.  Data Reviewed:   CBC: Recent Labs  Lab 04/15/23 1706 04/16/23 0500 04/20/23 0542 04/21/23 0848  WBC 9.8 9.6 7.0 7.1  NEUTROABS 8.2*  --   --   --   HGB 9.7* 11.4* 11.5* 12.5*  HCT 29.8* 34.2* 34.7* 38.5*  MCV 96.1 95.0 92.3 93.2  PLT 140* 164 217 280   Basic  Metabolic Panel: Recent Labs  Lab 04/15/23 1706 04/16/23 0500 04/20/23 0542 04/22/23 0640  NA 135 132* 137 134*  K 3.8 3.6 3.7 4.1  CL 105 103 106 103  CO2 17* 20* 22 24  GLUCOSE 153* 160* 137* 151*  BUN 18 26* 18 19  CREATININE 0.90 1.17 0.76 0.86  CALCIUM  8.0* 8.4* 8.8* 8.7*  MG  --   --   --  1.9   GFR: Estimated Creatinine Clearance: 53.7 mL/min (by C-G formula based on SCr of 0.86 mg/dL). Liver Function Tests: Recent Labs  Lab 04/15/23 1706  AST 25  ALT 12  ALKPHOS 55  BILITOT 0.7  PROT 6.6  ALBUMIN 3.0*   No results for input(s): LIPASE, AMYLASE in the last 168 hours. No results for input(s): AMMONIA in the last 168 hours. Coagulation Profile: No results for input(s): INR, PROTIME in the last 168 hours. Cardiac Enzymes: No results for input(s): CKTOTAL, CKMB, CKMBINDEX, TROPONINI in the last 168 hours. BNP (last 3 results) No results for input(s): PROBNP in the last 8760 hours. HbA1C: No results for input(s): HGBA1C in the last 72 hours. CBG: Recent Labs  Lab 04/21/23 1141 04/21/23 1803 04/21/23 2152 04/22/23 0756  GLUCAP 226* 116* 156* 150*   Lipid Profile: No results for input(s): CHOL, HDL, LDLCALC, TRIG, CHOLHDL, LDLDIRECT in the last 72 hours. Thyroid  Function Tests: No results for input(s): TSH, T4TOTAL, FREET4, T3FREE, THYROIDAB in the last 72 hours. Anemia Panel: No results for input(s): VITAMINB12, FOLATE, FERRITIN, TIBC, IRON , RETICCTPCT in the last 72 hours. Sepsis Labs: No results for input(s): PROCALCITON, LATICACIDVEN in the last 168 hours.  Recent Results (from the past 240 hours)  Resp panel by RT-PCR (RSV, Flu A&B, Covid) Anterior Nasal Swab     Status: Abnormal   Collection Time: 04/14/23  9:56 AM   Specimen: Anterior Nasal Swab  Result Value Ref Range Status   SARS Coronavirus 2 by RT PCR NEGATIVE NEGATIVE Final    Comment: (NOTE) SARS-CoV-2 target nucleic acids are  NOT DETECTED.  The SARS-CoV-2 RNA is generally detectable in upper respiratory specimens during the acute phase of infection. The lowest concentration of SARS-CoV-2 viral copies this assay can detect is 138 copies/mL. A negative result does not preclude SARS-Cov-2 infection and should not be used as the sole basis for treatment or other patient management decisions. A negative result may occur with  improper specimen collection/handling, submission of specimen other than nasopharyngeal swab, presence of viral mutation(s) within the areas targeted by this assay, and inadequate number of viral copies(<138 copies/mL). A negative result must be combined with clinical observations, patient history, and epidemiological information. The expected result is Negative.  Fact Sheet for Patients:  bloggercourse.com  Fact Sheet for Healthcare Providers:  seriousbroker.it  This test is no t yet approved or cleared by the [place]  FDA and  has been authorized for detection and/or diagnosis of SARS-CoV-2 by FDA under an Emergency Use Authorization (EUA). This EUA will remain  in effect (meaning this test can be used) for the duration of the COVID-19 declaration under Section 564(b)(1) of the Act, 21 U.S.C.section 360bbb-3(b)(1), unless the authorization is terminated  or revoked sooner.       Influenza A by PCR POSITIVE (A) NEGATIVE Final   Influenza B by PCR NEGATIVE NEGATIVE Final    Comment: (NOTE) The Xpert Xpress SARS-CoV-2/FLU/RSV plus assay is intended as an aid in the diagnosis of influenza from Nasopharyngeal swab specimens and should not be used as a sole basis for treatment. Nasal washings and aspirates are unacceptable for Xpert Xpress SARS-CoV-2/FLU/RSV testing.  Fact Sheet for Patients: bloggercourse.com  Fact Sheet for Healthcare Providers: seriousbroker.it  This test is  not yet approved or cleared by the [place]  FDA and has been authorized for detection and/or diagnosis of SARS-CoV-2 by FDA under an Emergency Use Authorization (EUA). This EUA will remain in effect (meaning this test can be used) for the duration of the COVID-19 declaration under Section 564(b)(1) of the Act, 21 U.S.C. section 360bbb-3(b)(1), unless the authorization is terminated or revoked.     Resp Syncytial Virus by PCR NEGATIVE NEGATIVE Final    Comment: (NOTE) Fact Sheet for Patients: bloggercourse.com  Fact Sheet for Healthcare Providers: seriousbroker.it  This test is not yet approved or cleared by the [place]  FDA and has been authorized for detection and/or diagnosis of SARS-CoV-2 by FDA under an Emergency Use Authorization (EUA). This EUA will remain in effect (meaning this test can be used) for the duration of the COVID-19 declaration under Section 564(b)(1) of the Act, 21 U.S.C. section 360bbb-3(b)(1), unless the authorization is terminated or revoked.  Performed at [facility], [address] [place], [place] [address]          Radiology Studies: No results found.         LOS: 4 days   Time spent= 35 mins    Burgess JAYSON Dare, MD [facility]  If 7PM-7AM, please contact night-coverage  04/22/2023, 10:46 AM

## 2023-04-22 NOTE — Plan of Care (Signed)
  Problem: Education: Goal: Knowledge of General Education information will improve Description: Including pain rating scale, medication(s)/side effects and non-pharmacologic comfort measures Outcome: Not Progressing   Problem: Health Behavior/Discharge Planning: Goal: Ability to manage health-related needs will improve Outcome: Not Progressing   Problem: Clinical Measurements: Goal: Ability to maintain clinical measurements within normal limits will improve Outcome: Progressing Goal: Will remain free from infection Outcome: Progressing Goal: Diagnostic test results will improve Outcome: Progressing Goal: Respiratory complications will improve Outcome: Progressing Goal: Cardiovascular complication will be avoided Outcome: Progressing   Problem: Activity: Goal: Risk for activity intolerance will decrease Outcome: Not Progressing   Problem: Nutrition: Goal: Adequate nutrition will be maintained Outcome: Progressing   Problem: Coping: Goal: Level of anxiety will decrease Outcome: Progressing   Problem: Elimination: Goal: Will not experience complications related to bowel motility Outcome: Progressing Goal: Will not experience complications related to urinary retention Outcome: Progressing   Problem: Pain Management: Goal: General experience of comfort will improve Outcome: Progressing   Problem: Safety: Goal: Ability to remain free from injury will improve Outcome: Progressing   Problem: Skin Integrity: Goal: Risk for impaired skin integrity will decrease Outcome: Progressing   Problem: Education: Goal: Ability to describe self-care measures that may prevent or decrease complications (Diabetes Survival Skills Education) will improve Outcome: Progressing Goal: Individualized Educational Video(s) Outcome: Progressing   Problem: Coping: Goal: Ability to adjust to condition or change in health will improve Outcome: Progressing   Problem: Fluid Volume: Goal:  Ability to maintain a balanced intake and output will improve Outcome: Progressing   Problem: Health Behavior/Discharge Planning: Goal: Ability to identify and utilize available resources and services will improve Outcome: Progressing Goal: Ability to manage health-related needs will improve Outcome: Progressing   Problem: Metabolic: Goal: Ability to maintain appropriate glucose levels will improve Outcome: Progressing   Problem: Nutritional: Goal: Maintenance of adequate nutrition will improve Outcome: Progressing Goal: Progress toward achieving an optimal weight will improve Outcome: Progressing   Problem: Skin Integrity: Goal: Risk for impaired skin integrity will decrease Outcome: Progressing   Problem: Tissue Perfusion: Goal: Adequacy of tissue perfusion will improve Outcome: Progressing

## 2023-04-23 ENCOUNTER — Inpatient Hospital Stay (HOSPITAL_COMMUNITY): Payer: Medicare Other

## 2023-04-23 DIAGNOSIS — J101 Influenza due to other identified influenza virus with other respiratory manifestations: Secondary | ICD-10-CM | POA: Diagnosis not present

## 2023-04-23 LAB — BASIC METABOLIC PANEL
Anion gap: 9 (ref 5–15)
BUN: 24 mg/dL — ABNORMAL HIGH (ref 8–23)
CO2: 20 mmol/L — ABNORMAL LOW (ref 22–32)
Calcium: 8.7 mg/dL — ABNORMAL LOW (ref 8.9–10.3)
Chloride: 106 mmol/L (ref 98–111)
Creatinine, Ser: 0.89 mg/dL (ref 0.61–1.24)
GFR, Estimated: >60 mL/min (ref >60)
Glucose, Bld: 127 mg/dL — ABNORMAL HIGH (ref 70–99)
Potassium: 3.8 mmol/L (ref 3.5–5.1)
Sodium: 135 mmol/L (ref 135–145)

## 2023-04-23 LAB — MAGNESIUM: Magnesium: 2.1 mg/dL (ref 1.7–2.4)

## 2023-04-23 LAB — HEMOGLOBIN A1C
Hgb A1c MFr Bld: 6.8 % — ABNORMAL HIGH (ref 4.8–5.6)
Mean Plasma Glucose: 148 mg/dL

## 2023-04-23 LAB — GLUCOSE, CAPILLARY
Glucose-Capillary: 135 mg/dL — ABNORMAL HIGH (ref 70–99)
Glucose-Capillary: 152 mg/dL — ABNORMAL HIGH (ref 70–99)
Glucose-Capillary: 119 mg/dL — ABNORMAL HIGH (ref 70–99)
Glucose-Capillary: 148 mg/dL — ABNORMAL HIGH (ref 70–99)

## 2023-04-23 MED ORDER — SODIUM CHLORIDE 0.9 % IV SOLN
INTRAVENOUS | Status: AC
  Administered 2023-04-23: 1000 mL via INTRAVENOUS
  Administered 2023-04-23: 450 mL via INTRAVENOUS

## 2023-04-23 NOTE — Plan of Care (Signed)
  Problem: Education: Goal: Knowledge of General Education information will improve Description: Including pain rating scale, medication(s)/side effects and non-pharmacologic comfort measures Outcome: Not Progressing   Problem: Health Behavior/Discharge Planning: Goal: Ability to manage health-related needs will improve Outcome: Not Progressing   Problem: Clinical Measurements: Goal: Ability to maintain clinical measurements within normal limits will improve Outcome: Progressing Goal: Will remain free from infection Outcome: Progressing Goal: Diagnostic test results will improve Outcome: Progressing Goal: Respiratory complications will improve Outcome: Progressing Goal: Cardiovascular complication will be avoided Outcome: Progressing   Problem: Nutrition: Goal: Adequate nutrition will be maintained Outcome: Progressing   Problem: Coping: Goal: Level of anxiety will decrease Outcome: Progressing   Problem: Elimination: Goal: Will not experience complications related to bowel motility Outcome: Progressing Goal: Will not experience complications related to urinary retention Outcome: Progressing   Problem: Pain Management: Goal: General experience of comfort will improve Outcome: Progressing   Problem: Safety: Goal: Ability to remain free from injury will improve Outcome: Progressing   Problem: Skin Integrity: Goal: Risk for impaired skin integrity will decrease Outcome: Progressing   Problem: Education: Goal: Ability to describe self-care measures that may prevent or decrease complications (Diabetes Survival Skills Education) will improve Outcome: Progressing Goal: Individualized Educational Video(s) Outcome: Progressing   Problem: Coping: Goal: Ability to adjust to condition or change in health will improve Outcome: Progressing   Problem: Fluid Volume: Goal: Ability to maintain a balanced intake and output will improve Outcome: Progressing   Problem: Health  Behavior/Discharge Planning: Goal: Ability to identify and utilize available resources and services will improve Outcome: Progressing Goal: Ability to manage health-related needs will improve Outcome: Progressing   Problem: Nutritional: Goal: Maintenance of adequate nutrition will improve Outcome: Progressing Goal: Progress toward achieving an optimal weight will improve Outcome: Progressing

## 2023-04-23 NOTE — Plan of Care (Signed)
   Problem: Education: Goal: Knowledge of General Education information will improve Description Including pain rating scale, medication(s)/side effects and non-pharmacologic comfort measures Outcome: Progressing   Problem: Health Behavior/Discharge Planning: Goal: Ability to manage health-related needs will improve Outcome: Progressing

## 2023-04-23 NOTE — Progress Notes (Signed)
 PROGRESS NOTE    Steven [name]  FMW:[id number] DOB: 01-19-1931 DOA: 04/15/2023 PCP: Katrinka Garnette KIDD, MD    Brief Narrative:  89 year old with history of CAD, DM2, dementia, HLD comes to the ED with change in mental status.  Upon admission noted to have adult failure to thrive and was diagnosed with influenza and started on Tamiflu .  PT/OT recommended SNF.   Assessment & Plan:  Principal Problem:   Influenza A    Adult failure to thrive/influenza A Cognitive impairment with dementia Initially patient diagnosed with influenza.  Treated with 5 days of Tamiflu .  Completed course on 1/3.  Due to generalized weakness PT/OT is recommended SNF.   Coronary artery disease Currently chest pain-free.  Continue aspirin , Toprol -XL   Acute on chronic anemia -Hemoglobin is 11.5 -Stable   Hyperlipidemia Will resume Lipitor upon discharge  Diabetes mellitus type 2 - Sliding scale and Accu-Cheks.  Metformin  on hold; resume upon dc.    Hypertension -Continue metoprolol .  IV as needed   PT/OT-SNF.  TOC consulted    DVT prophylaxis: enoxaparin  (LOVENOX ) injection 40 mg Start: 04/15/23 2200 SCDs Start: 04/15/23 2000 SCDs Start: 04/15/23 1901      Code Status: Full Code Family Communication: Caregiver at bedside Status is: Inpatient Remains inpatient appropriate because: SNF placement  Subjective: No new complaints, resting.   Examination:  General exam: Appears calm and comfortable, elderly frail Respiratory system: Clear to auscultation. Respiratory effort normal. Cardiovascular system: S1 & S2 heard, RRR. No JVD, murmurs, rubs, gallops or clicks. No pedal edema. Gastrointestinal system: Abdomen is nondistended, soft and nontender. No organomegaly or masses felt. Normal bowel sounds heard. Central nervous system: Alert and oriented to name only, baseline. No focal neurological deficits. Extremities: Symmetric 5 x 5 power. Skin: No rashes, lesions or ulcers Psychiatry:  Judgement and insight appear poor            Pressure Injury 04/15/23 Sacrum Stage 2 -  Partial thickness loss of dermis presenting as a shallow open injury with a red, pink wound bed without slough. (Active)  04/15/23 2039  Location: Sacrum  Location Orientation:   Staging: Stage 2 -  Partial thickness loss of dermis presenting as a shallow open injury with a red, pink wound bed without slough.  Wound Description (Comments):   Present on Admission: Yes     Diet Orders (From admission, onward)     Start     Ordered   04/15/23 2000  Diet regular Room service appropriate? Yes; Fluid consistency: Thin  Diet effective now       Question Answer Comment  Room service appropriate? Yes   Fluid consistency: Thin      04/15/23 2000            Objective: Vitals:   04/22/23 1242 04/22/23 2002 04/23/23 0500 04/23/23 1158  BP: 129/68 (!) 173/96 129/74 126/68  Pulse: 62 85 73 69  Resp: 20 20 18 17   Temp: 98.5 F (36.9 C) 98.2 F (36.8 C) 98.5 F (36.9 C)   TempSrc: Oral Oral Oral   SpO2: 100% 98% 96% 95%  Weight:      Height:       No intake or output data in the 24 hours ending 04/23/23 1208 Filed Weights   04/16/23 0629  Weight: 66.5 kg    Scheduled Meds:  aspirin  EC  81 mg Oral Daily   enoxaparin  (LOVENOX ) injection  40 mg Subcutaneous QHS   insulin  aspart  0-5 Units Subcutaneous QHS  insulin  aspart  0-9 Units Subcutaneous TID WC   metoprolol  succinate  50 mg Oral Daily   Continuous Infusions:  sodium chloride  450 mL (04/23/23 1019)    Nutritional status     Body mass index is 21.65 kg/m.  Data Reviewed:   CBC: Recent Labs  Lab 04/20/23 0542 04/21/23 0848  WBC 7.0 7.1  HGB 11.5* 12.5*  HCT 34.7* 38.5*  MCV 92.3 93.2  PLT 217 280   Basic Metabolic Panel: Recent Labs  Lab 04/20/23 0542 04/22/23 0640 04/23/23 0528  NA 137 134* 135  K 3.7 4.1 3.8  CL 106 103 106  CO2 22 24 20*  GLUCOSE 137* 151* 127*  BUN 18 19 24*  CREATININE 0.76  0.86 0.89  CALCIUM  8.8* 8.7* 8.7*  MG  --  1.9 2.1   GFR: Estimated Creatinine Clearance: 51.9 mL/min (by C-G formula based on SCr of 0.89 mg/dL). Liver Function Tests: No results for input(s): AST, ALT, ALKPHOS, BILITOT, PROT, ALBUMIN in the last 168 hours. No results for input(s): LIPASE, AMYLASE in the last 168 hours. No results for input(s): AMMONIA in the last 168 hours. Coagulation Profile: No results for input(s): INR, PROTIME in the last 168 hours. Cardiac Enzymes: No results for input(s): CKTOTAL, CKMB, CKMBINDEX, TROPONINI in the last 168 hours. BNP (last 3 results) No results for input(s): PROBNP in the last 8760 hours. HbA1C: Recent Labs    04/21/23 0848  HGBA1C 6.8*   CBG: Recent Labs  Lab 04/22/23 1151 04/22/23 1650 04/22/23 2113 04/23/23 0726 04/23/23 1157  GLUCAP 156* 109* 184* 135* 152*   Lipid Profile: No results for input(s): CHOL, HDL, LDLCALC, TRIG, CHOLHDL, LDLDIRECT in the last 72 hours. Thyroid  Function Tests: No results for input(s): TSH, T4TOTAL, FREET4, T3FREE, THYROIDAB in the last 72 hours. Anemia Panel: No results for input(s): VITAMINB12, FOLATE, FERRITIN, TIBC, IRON , RETICCTPCT in the last 72 hours. Sepsis Labs: No results for input(s): PROCALCITON, LATICACIDVEN in the last 168 hours.  Recent Results (from the past 240 hours)  Resp panel by RT-PCR (RSV, Flu A&B, Covid) Anterior Nasal Swab     Status: Abnormal   Collection Time: 04/14/23  9:56 AM   Specimen: Anterior Nasal Swab  Result Value Ref Range Status   SARS Coronavirus 2 by RT PCR NEGATIVE NEGATIVE Final    Comment: (NOTE) SARS-CoV-2 target nucleic acids are NOT DETECTED.  The SARS-CoV-2 RNA is generally detectable in upper respiratory specimens during the acute phase of infection. The lowest concentration of SARS-CoV-2 viral copies this assay can detect is 138 copies/mL. A negative result does not preclude  SARS-Cov-2 infection and should not be used as the sole basis for treatment or other patient management decisions. A negative result may occur with  improper specimen collection/handling, submission of specimen other than nasopharyngeal swab, presence of viral mutation(s) within the areas targeted by this assay, and inadequate number of viral copies(<138 copies/mL). A negative result must be combined with clinical observations, patient history, and epidemiological information. The expected result is Negative.  Fact Sheet for Patients:  bloggercourse.com  Fact Sheet for Healthcare Providers:  seriousbroker.it  This test is no t yet approved or cleared by the [place]  FDA and  has been authorized for detection and/or diagnosis of SARS-CoV-2 by FDA under an Emergency Use Authorization (EUA). This EUA will remain  in effect (meaning this test can be used) for the duration of the COVID-19 declaration under Section 564(b)(1) of the Act, 21 U.S.C.section 360bbb-3(b)(1), unless the authorization is  terminated  or revoked sooner.       Influenza A by PCR POSITIVE (A) NEGATIVE Final   Influenza B by PCR NEGATIVE NEGATIVE Final    Comment: (NOTE) The Xpert Xpress SARS-CoV-2/FLU/RSV plus assay is intended as an aid in the diagnosis of influenza from Nasopharyngeal swab specimens and should not be used as a sole basis for treatment. Nasal washings and aspirates are unacceptable for Xpert Xpress SARS-CoV-2/FLU/RSV testing.  Fact Sheet for Patients: bloggercourse.com  Fact Sheet for Healthcare Providers: seriousbroker.it  This test is not yet approved or cleared by the [place]  FDA and has been authorized for detection and/or diagnosis of SARS-CoV-2 by FDA under an Emergency Use Authorization (EUA). This EUA will remain in effect (meaning this test can be used) for the duration of  the COVID-19 declaration under Section 564(b)(1) of the Act, 21 U.S.C. section 360bbb-3(b)(1), unless the authorization is terminated or revoked.     Resp Syncytial Virus by PCR NEGATIVE NEGATIVE Final    Comment: (NOTE) Fact Sheet for Patients: bloggercourse.com  Fact Sheet for Healthcare Providers: seriousbroker.it  This test is not yet approved or cleared by the [place]  FDA and has been authorized for detection and/or diagnosis of SARS-CoV-2 by FDA under an Emergency Use Authorization (EUA). This EUA will remain in effect (meaning this test can be used) for the duration of the COVID-19 declaration under Section 564(b)(1) of the Act, 21 U.S.C. section 360bbb-3(b)(1), unless the authorization is terminated or revoked.  Performed at [facility], [address], [place], [place] [address]          Radiology Studies: No results found.         LOS: 5 days   Time spent= 35 mins    Burgess JAYSON Dare, MD [facility]  If 7PM-7AM, please contact night-coverage  04/23/2023, 12:08 PM

## 2023-04-23 NOTE — TOC Progression Note (Addendum)
 Transition of Care [facility]) - Progression Note    Patient Details  Name: BRIXTON FRANKO MRN: [id number] Date of Birth: Sep 08, 1930  [facility] CM/SW Contact  Hoy DELENA Bigness, LCSW Phone Number: 04/23/2023, 11:53 AM  Clinical Narrative:    Left voicemail with daughter to review bed offers for SNF. Awaiting return call.   ADDENDUM: Spoke with pt's daughter who accepted bed offer for [facility]. Insurance auth requested and currently pending.  Expected Discharge Plan: Skilled Nursing Facility Barriers to Discharge: Continued Medical Work up  Expected Discharge Plan and Services   Discharge Planning Services: CM Consult Post Acute Care Choice: Skilled Nursing Facility Living arrangements for the past 2 months: Single Family Home                                       Social Determinants of Health (SDOH) Interventions SDOH Screenings   Food Insecurity: No Food Insecurity (04/20/2023)  Housing: Low Risk  (04/20/2023)  Transportation Needs: No Transportation Needs (04/20/2023)  Utilities: Not At Risk (04/20/2023)  Depression (PHQ2-9): Low Risk  (12/25/2022)  Social Connections: Unknown (04/16/2023)  Tobacco Use: Low Risk  (04/16/2023)    Readmission Risk Interventions     No data to display

## 2023-04-23 NOTE — Progress Notes (Signed)
 Physical Therapy Treatment Patient Details Name: Steven [name] MRN: [id number] DOB: 04-29-1930 Today's Date: 04/23/2023   History of Present Illness 89 y.o. male brought to ED 04/14/23 with cough, diarrhea, AMS, inability to ambulate, positive for flu A. PMH: CAD, type 2 diabetes, dementia, hyperlipidemia    PT Comments  Pt in bed with Personal Aide at bedside.  Pt AxO x 1 only.  Attempted OOB to recliner was unsuccessful.  General bed mobility comments: Multimodel cueing provided to initiate and sequence through bed mobility. Pt requiring 2 person assist d/t severity of dementia and resistance to follow commands. Pt with severe posterior lean while seated EOB with fear of falling. General transfer comment: Attempted sit to stand although pt demonstrated severe resistance and pushing posteriorly. Pain and bilateral feet verbalized. Pt was assisted back to bed + 2 Total Asisst and positioned to comfort. Noted gross edema R foot.  Removed sock.  Reported to RN. Pt will need ST Rehab at SNF to address mobility and functional decline prior to safely returning home.    If plan is discharge home, recommend the following: Two people to help with walking and/or transfers;Two people to help with bathing/dressing/bathroom;Assist for transportation   Can travel by private vehicle     No  Equipment Recommendations  None recommended by PT    Recommendations for Other Services       Precautions / Restrictions Precautions Precautions: Fall Precaution Comments: Hx Dementia Restrictions Weight Bearing Restrictions Per Provider Order: No     Mobility  Bed Mobility Overal bed mobility: Needs Assistance Bed Mobility: Supine to Sit, Sit to Supine     Supine to sit: Total assist, +2 for physical assistance, +2 for safety/equipment, HOB elevated     General bed mobility comments: Multimodel cueing provided to initiate and sequence through bed mobility. Pt requiring 2 person assist d/t severity of  dementia and resistance to follow commands. Pt with severe posterior lean while seated EOB with fear of falling.    Transfers Overall transfer level: Needs assistance                 General transfer comment: Attempted sit to stand although pt demonstrated severe resistance and pushing posteriorly. Pain and bilateral feet verbalized. Pt was assisted back to bed.    Ambulation/Gait                   Stairs             Wheelchair Mobility     Tilt Bed    Modified Rankin (Stroke Patients Only)       Balance                                            Cognition Arousal: Alert Behavior During Therapy: Restless, Flat affect Overall Cognitive Status: History of cognitive impairments - at baseline                                 General Comments: AxO X1; good eye contact and responds to name but difficulty following commands and resistant. Pt repeats let's get out of here.        Exercises      General Comments        Pertinent Vitals/Pain Pain Assessment Pain Assessment: Faces Faces Pain Scale: Hurts  a little bit Pain Location: Bilateral feet (edema), Right shoulder when mobilized. Pain Descriptors / Indicators: Grimacing, Guarding Pain Intervention(s): Monitored during session    Home Living                          Prior Function            PT Goals (current goals can now be found in the care plan section) Progress towards PT goals: Progressing toward goals    Frequency    Min 1X/week      PT Plan      Co-evaluation              AM-PAC PT 6 Clicks Mobility   Outcome Measure  Help needed turning from your back to your side while in a flat bed without using bedrails?: Total Help needed moving from lying on your back to sitting on the side of a flat bed without using bedrails?: Total Help needed moving to and from a bed to a chair (including a wheelchair)?: Total Help  needed standing up from a chair using your arms (e.g., wheelchair or bedside chair)?: Total Help needed to walk in hospital room?: Total Help needed climbing 3-5 steps with a railing? : Total 6 Click Score: 6    End of Session Equipment Utilized During Treatment: Gait belt Activity Tolerance: Other (comment) (cognition) Patient left: in bed;with call bell/phone within reach;with nursing/sitter in room;with family/visitor present Nurse Communication: Mobility status PT Visit Diagnosis: Unsteadiness on feet (R26.81);Muscle weakness (generalized) (M62.81);Difficulty in walking, not elsewhere classified (R26.2)     Time: 8941-8886 PT Time Calculation (min) (ACUTE ONLY): 15 min  Charges:    $Therapeutic Activity: 8-22 mins PT General Charges $$ ACUTE PT VISIT: 1 Visit                     {Elih Mooney  PTA Acute  Rehabilitation Services Office M-F          [phone]

## 2023-04-24 DIAGNOSIS — M6281 Muscle weakness (generalized): Secondary | ICD-10-CM | POA: Diagnosis not present

## 2023-04-24 DIAGNOSIS — R2681 Unsteadiness on feet: Secondary | ICD-10-CM | POA: Diagnosis not present

## 2023-04-24 DIAGNOSIS — E119 Type 2 diabetes mellitus without complications: Secondary | ICD-10-CM | POA: Diagnosis not present

## 2023-04-24 DIAGNOSIS — L988 Other specified disorders of the skin and subcutaneous tissue: Secondary | ICD-10-CM | POA: Diagnosis not present

## 2023-04-24 DIAGNOSIS — E785 Hyperlipidemia, unspecified: Secondary | ICD-10-CM | POA: Diagnosis not present

## 2023-04-24 DIAGNOSIS — L89890 Pressure ulcer of other site, unstageable: Secondary | ICD-10-CM | POA: Diagnosis not present

## 2023-04-24 DIAGNOSIS — D638 Anemia in other chronic diseases classified elsewhere: Secondary | ICD-10-CM | POA: Diagnosis not present

## 2023-04-24 DIAGNOSIS — Z7401 Bed confinement status: Secondary | ICD-10-CM | POA: Diagnosis not present

## 2023-04-24 DIAGNOSIS — R627 Adult failure to thrive: Secondary | ICD-10-CM | POA: Diagnosis not present

## 2023-04-24 DIAGNOSIS — J101 Influenza due to other identified influenza virus with other respiratory manifestations: Secondary | ICD-10-CM | POA: Diagnosis not present

## 2023-04-24 DIAGNOSIS — R531 Weakness: Secondary | ICD-10-CM | POA: Diagnosis not present

## 2023-04-24 DIAGNOSIS — L89512 Pressure ulcer of right ankle, stage 2: Secondary | ICD-10-CM | POA: Diagnosis not present

## 2023-04-24 DIAGNOSIS — I10 Essential (primary) hypertension: Secondary | ICD-10-CM | POA: Diagnosis not present

## 2023-04-24 DIAGNOSIS — I2510 Atherosclerotic heart disease of native coronary artery without angina pectoris: Secondary | ICD-10-CM | POA: Diagnosis not present

## 2023-04-24 DIAGNOSIS — G629 Polyneuropathy, unspecified: Secondary | ICD-10-CM | POA: Diagnosis not present

## 2023-04-24 DIAGNOSIS — L89216 Pressure-induced deep tissue damage of right hip: Secondary | ICD-10-CM | POA: Diagnosis not present

## 2023-04-24 DIAGNOSIS — L89896 Pressure-induced deep tissue damage of other site: Secondary | ICD-10-CM | POA: Diagnosis not present

## 2023-04-24 DIAGNOSIS — R1310 Dysphagia, unspecified: Secondary | ICD-10-CM | POA: Diagnosis not present

## 2023-04-24 DIAGNOSIS — G309 Alzheimer's disease, unspecified: Secondary | ICD-10-CM | POA: Diagnosis not present

## 2023-04-24 DIAGNOSIS — R0989 Other specified symptoms and signs involving the circulatory and respiratory systems: Secondary | ICD-10-CM | POA: Diagnosis not present

## 2023-04-24 DIAGNOSIS — L89892 Pressure ulcer of other site, stage 2: Secondary | ICD-10-CM | POA: Diagnosis not present

## 2023-04-24 DIAGNOSIS — E440 Moderate protein-calorie malnutrition: Secondary | ICD-10-CM | POA: Diagnosis not present

## 2023-04-24 DIAGNOSIS — L89893 Pressure ulcer of other site, stage 3: Secondary | ICD-10-CM | POA: Diagnosis not present

## 2023-04-24 DIAGNOSIS — L89516 Pressure-induced deep tissue damage of right ankle: Secondary | ICD-10-CM | POA: Diagnosis not present

## 2023-04-24 DIAGNOSIS — Z743 Need for continuous supervision: Secondary | ICD-10-CM | POA: Diagnosis not present

## 2023-04-24 DIAGNOSIS — D649 Anemia, unspecified: Secondary | ICD-10-CM | POA: Diagnosis not present

## 2023-04-24 DIAGNOSIS — L97919 Non-pressure chronic ulcer of unspecified part of right lower leg with unspecified severity: Secondary | ICD-10-CM | POA: Diagnosis not present

## 2023-04-24 LAB — GLUCOSE, CAPILLARY
Glucose-Capillary: 132 mg/dL — ABNORMAL HIGH (ref 70–99)
Glucose-Capillary: 180 mg/dL — ABNORMAL HIGH (ref 70–99)

## 2023-04-24 NOTE — Discharge Summary (Signed)
 Physician Discharge Summary  SUNNY GAINS FMW:[id number] DOB: 1930-06-27 DOA: 04/15/2023  PCP: Katrinka Garnette KIDD, MD  Admit date: 04/15/2023 Discharge date: 04/24/2023  Admitted From: Home Disposition:  SNF  Recommendations for Outpatient Follow-up:  Follow up with PCP in 1-2 weeks Please obtain BMP/CBC in one week your next doctors visit.    Discharge Condition: Stable CODE STATUS: Full Diet recommendation: Diabetic   Brief Narrative:  89 year old with history of CAD, DM2, dementia, HLD comes to the ED with change in mental status.  Upon admission noted to have adult failure to thrive and was diagnosed with influenza and started on Tamiflu .  PT/OT recommended SNF. After waiting several day, he received SNF approval.    Assessment & Plan:  Principal Problem:   Influenza A    Adult failure to thrive/influenza A Cognitive impairment with dementia Initially patient diagnosed with influenza.  Treated with 5 days of Tamiflu .  Completed course on 1/3.  Due to generalized weakness PT/OT is recommended SNF.   Coronary artery disease Currently chest pain-free.  Continue aspirin , Toprol -XL  Right foot swelling - X-ray negative for any acute pathology/fracture.  There is some soft tissue swelling.  Continue to monitor this. Rest, icu, compression bandage and elevate   Acute on chronic anemia -Hemoglobin is 11.5 -Stable   Hyperlipidemia Will resume Lipitor upon discharge  Diabetes mellitus type 2 - resume metformin  upon dc   Hypertension -Continue metoprolol .     PT/OT-SNF.    DVT prophylaxis: enoxaparin  (LOVENOX ) injection 40 mg Start: 04/15/23 2200 SCDs Start: 04/15/23 2000 SCDs Start: 04/15/23 1901      Code Status: Full Code Family Communication: Caregiver at bedside Status is: Inpatient Remains inpatient appropriate because: SNF placement  Subjective: No new complaints  Examination:  General exam: Appears calm and comfortable, elderly frail Respiratory  system: Clear to auscultation. Respiratory effort normal. Cardiovascular system: S1 & S2 heard, RRR. No JVD, murmurs, rubs, gallops or clicks. No pedal edema. Gastrointestinal system: Abdomen is nondistended, soft and nontender. No organomegaly or masses felt. Normal bowel sounds heard. Central nervous system: Alert and oriented to name only, baseline. No focal neurological deficits. Extremities: Symmetric 5 x 5 power. Skin: No rashes, lesions or ulcers Psychiatry: Judgement and insight appear poor    Discharge Diagnoses:  Principal Problem:   Influenza A      Discharge Exam: Vitals:   04/23/23 2113 04/24/23 0434  BP: (!) 140/63 135/71  Pulse: 62 68  Resp: 18 18  Temp: 98.3 F (36.8 C) 98.1 F (36.7 C)  SpO2: 99% 99%   Vitals:   04/23/23 0500 04/23/23 1158 04/23/23 2113 04/24/23 0434  BP: 129/74 126/68 (!) 140/63 135/71  Pulse: 73 69 62 68  Resp: 18 17 18 18   Temp: 98.5 F (36.9 C)  98.3 F (36.8 C) 98.1 F (36.7 C)  TempSrc: Oral     SpO2: 96% 95% 99% 99%  Weight:      Height:          Discharge Instructions   Allergies as of 04/24/2023       Reactions   Alendronate  Other (See Comments)   Reflux   Metformin  And Related Other (See Comments)   Patient cannot tolerate more than a total of 1,000 mg/24 hours        Medication List     STOP taking these medications    amoxicillin -clavulanate 875-125 MG tablet Commonly known as: AUGMENTIN    clotrimazole  1 % cream Commonly known as: Clotrimazole  AF  fluconazole  150 MG tablet Commonly known as: DIFLUCAN    mupirocin  ointment 2 % Commonly known as: BACTROBAN    oseltamivir  75 MG capsule Commonly known as: TAMIFLU        TAKE these medications    acetaminophen  500 MG tablet Commonly known as: TYLENOL  Take 500-1,000 mg by mouth every 8 (eight) hours as needed for mild pain (pain score 1-3) (not to exceed a sum total of 3,000 mg/day).   aspirin  81 MG tablet Take 81 mg by mouth daily.    atorvastatin  80 MG tablet Commonly known as: LIPITOR TAKE 1 TABLET DAILY.   blood glucose meter kit and supplies Kit Dispense based on patient and insurance preference. Use up to four times daily as directed.   gabapentin  100 MG capsule Commonly known as: NEURONTIN  Take 1 capsule (100 mg total) by mouth at bedtime.   metFORMIN  1000 MG tablet Commonly known as: GLUCOPHAGE  TAKE 1 TABLET (1,000 MG TOTAL) BY MOUTH TWICE A DAY WITH FOOD What changed: See the new instructions.   metoprolol  succinate 50 MG 24 hr tablet Commonly known as: TOPROL -XL TAKE 1 TABLET BY MOUTH DAILY. TAKE WITH OR IMMEDIATELY FOLLOWING A MEAL.        Contact information for follow-up providers     Katrinka Garnette KIDD, MD Follow up in 1 week(s).   Specialty: Family Medicine Contact information: [address] [place] [address] [phone]         Katrinka Garnette KIDD, MD Follow up in 1 week(s).   Specialty: Family Medicine Contact information: [address] [place] [address] [phone]              Contact information for after-discharge care     Destination     [facility] Preferred SNF .   Service: Skilled Nursing Contact information: [address] [place]  [address] [phone]                    Allergies  Allergen Reactions   Alendronate  Other (See Comments)    Reflux    Metformin  And Related Other (See Comments)    Patient cannot tolerate more than a total of 1,000 mg/24 hours    You were cared for by a hospitalist during your hospital stay. If you have any questions about your discharge medications or the care you received while you were in the hospital after you are discharged, you can call the unit and asked to speak with the hospitalist on call if the hospitalist that took care of you is not available. Once you are discharged, your primary care physician will handle any further medical issues. Please note  that no refills for any discharge medications will be authorized once you are discharged, as it is imperative that you return to your primary care physician (or establish a relationship with a primary care physician if you do not have one) for your aftercare needs so that they can reassess your need for medications and monitor your lab values.  You were cared for by a hospitalist during your hospital stay. If you have any questions about your discharge medications or the care you received while you were in the hospital after you are discharged, you can call the unit and asked to speak with the hospitalist on call if the hospitalist that took care of you is not available. Once you are discharged, your primary care physician will handle any further medical issues. Please note that NO REFILLS for any discharge medications  will be authorized once you are discharged, as it is imperative that you return to your primary care physician (or establish a relationship with a primary care physician if you do not have one) for your aftercare needs so that they can reassess your need for medications and monitor your lab values.  Please request your Prim.MD to go over all Hospital Tests and Procedure/Radiological results at the follow up, please get all Hospital records sent to your Prim MD by signing hospital release before you go home.  Get CBC, CMP, 2 view Chest X ray checked  by Primary MD during your next visit or SNF MD in 5-7 days ( we routinely change or add medications that can affect your baseline labs and fluid status, therefore we recommend that you get the mentioned basic workup next visit with your PCP, your PCP may decide not to get them or add new tests based on their clinical decision)  On your next visit with your primary care physician please Get Medicines reviewed and adjusted.  If you experience worsening of your admission symptoms, develop shortness of breath, life threatening emergency, suicidal or  homicidal thoughts you must seek medical attention immediately by calling 911 or calling your MD immediately  if symptoms less severe.  You Must read complete instructions/literature along with all the possible adverse reactions/side effects for all the Medicines you take and that have been prescribed to you. Take any new Medicines after you have completely understood and accpet all the possible adverse reactions/side effects.   Do not drive, operate heavy machinery, perform activities at heights, swimming or participation in water activities or provide baby sitting services if your were admitted for syncope or siezures until you have seen by Primary MD or a Neurologist and advised to do so again.  Do not drive when taking Pain medications.   Procedures/Studies: DG Foot 2 Views Right Result Date: 04/23/2023 CLINICAL DATA:  Swelling.  Pain.  No reported history of trauma EXAM: RIGHT FOOT - 2 VIEW COMPARISON:  None Available. FINDINGS: Osteopenia. Degenerative changes of the midfoot particularly at the navicular bone. Well corticated plantar calcaneal spur. No obvious fracture. Soft tissue swelling about the midfoot. Scattered vascular calcifications. Further workup as clinically appropriate if there is further concern of the etiology of the swelling such as infection. IMPRESSION: Degenerative changes of the midfoot. Osteopenia. Soft tissue swelling. Electronically Signed   By: Ranell Bring M.D.   On: 04/23/2023 12:39   DG Chest Port 1 View Result Date: 04/15/2023 CLINICAL DATA:  Altered mental status and shortness of breath EXAM: PORTABLE CHEST 1 VIEW COMPARISON:  Chest radiograph dated 04/14/2023 FINDINGS: Low lung volumes with bronchovascular crowding. No focal consolidations. No pleural effusion or pneumothorax. The heart size and mediastinal contours are within normal limits. Median sternotomy wires are nondisplaced. Old left posterior rib fractures. IMPRESSION: Low lung volumes with bronchovascular  crowding. No focal consolidations. Electronically Signed   By: Limin  Xu M.D.   On: 04/15/2023 16:41   DG Chest Portable 1 View Result Date: 04/14/2023 CLINICAL DATA:  Cough Dementia Diarrhea for 2 days EXAM: PORTABLE CHEST - 1 VIEW COMPARISON:  09/03/2012 FINDINGS: Cardiomediastinal silhouette and pulmonary vasculature are within normal limits. Lungs are clear. Evaluation is limited due to expiratory phase of imaging. Postsurgical changes of CABG are noted. IMPRESSION: No acute cardiopulmonary process. Electronically Signed   By: Aliene Lloyd M.D.   On: 04/14/2023 10:38     The results of significant diagnostics from this hospitalization (including imaging,  microbiology, ancillary and laboratory) are listed below for reference.     Microbiology: Recent Results (from the past 240 hours)  Resp panel by RT-PCR (RSV, Flu A&B, Covid) Anterior Nasal Swab     Status: Abnormal   Collection Time: 04/14/23  9:56 AM   Specimen: Anterior Nasal Swab  Result Value Ref Range Status   SARS Coronavirus 2 by RT PCR NEGATIVE NEGATIVE Final    Comment: (NOTE) SARS-CoV-2 target nucleic acids are NOT DETECTED.  The SARS-CoV-2 RNA is generally detectable in upper respiratory specimens during the acute phase of infection. The lowest concentration of SARS-CoV-2 viral copies this assay can detect is 138 copies/mL. A negative result does not preclude SARS-Cov-2 infection and should not be used as the sole basis for treatment or other patient management decisions. A negative result may occur with  improper specimen collection/handling, submission of specimen other than nasopharyngeal swab, presence of viral mutation(s) within the areas targeted by this assay, and inadequate number of viral copies(<138 copies/mL). A negative result must be combined with clinical observations, patient history, and epidemiological information. The expected result is Negative.  Fact Sheet for Patients:   bloggercourse.com  Fact Sheet for Healthcare Providers:  seriousbroker.it  This test is no t yet approved or cleared by the [place]  FDA and  has been authorized for detection and/or diagnosis of SARS-CoV-2 by FDA under an Emergency Use Authorization (EUA). This EUA will remain  in effect (meaning this test can be used) for the duration of the COVID-19 declaration under Section 564(b)(1) of the Act, 21 U.S.C.section 360bbb-3(b)(1), unless the authorization is terminated  or revoked sooner.       Influenza A by PCR POSITIVE (A) NEGATIVE Final   Influenza B by PCR NEGATIVE NEGATIVE Final    Comment: (NOTE) The Xpert Xpress SARS-CoV-2/FLU/RSV plus assay is intended as an aid in the diagnosis of influenza from Nasopharyngeal swab specimens and should not be used as a sole basis for treatment. Nasal washings and aspirates are unacceptable for Xpert Xpress SARS-CoV-2/FLU/RSV testing.  Fact Sheet for Patients: bloggercourse.com  Fact Sheet for Healthcare Providers: seriousbroker.it  This test is not yet approved or cleared by the [place]  FDA and has been authorized for detection and/or diagnosis of SARS-CoV-2 by FDA under an Emergency Use Authorization (EUA). This EUA will remain in effect (meaning this test can be used) for the duration of the COVID-19 declaration under Section 564(b)(1) of the Act, 21 U.S.C. section 360bbb-3(b)(1), unless the authorization is terminated or revoked.     Resp Syncytial Virus by PCR NEGATIVE NEGATIVE Final    Comment: (NOTE) Fact Sheet for Patients: bloggercourse.com  Fact Sheet for Healthcare Providers: seriousbroker.it  This test is not yet approved or cleared by the [place]  FDA and has been authorized for detection and/or diagnosis of SARS-CoV-2 by FDA under an Emergency Use  Authorization (EUA). This EUA will remain in effect (meaning this test can be used) for the duration of the COVID-19 declaration under Section 564(b)(1) of the Act, 21 U.S.C. section 360bbb-3(b)(1), unless the authorization is terminated or revoked.  Performed at [facility], [address], [place], [place] [address]      Labs: BNP (last 3 results) No results for input(s): BNP in the last 8760 hours. Basic Metabolic Panel: Recent Labs  Lab 04/20/23 0542 04/22/23 0640 04/23/23 0528  NA 137 134* 135  K 3.7 4.1 3.8  CL 106 103 106  CO2 22 24 20*  GLUCOSE 137* 151* 127*  BUN 18 19 24*  CREATININE 0.76 0.86 0.89  CALCIUM  8.8* 8.7* 8.7*  MG  --  1.9 2.1   Liver Function Tests: No results for input(s): AST, ALT, ALKPHOS, BILITOT, PROT, ALBUMIN in the last 168 hours. No results for input(s): LIPASE, AMYLASE in the last 168 hours. No results for input(s): AMMONIA in the last 168 hours. CBC: Recent Labs  Lab 04/20/23 0542 04/21/23 0848  WBC 7.0 7.1  HGB 11.5* 12.5*  HCT 34.7* 38.5*  MCV 92.3 93.2  PLT 217 280   Cardiac Enzymes: No results for input(s): CKTOTAL, CKMB, CKMBINDEX, TROPONINI in the last 168 hours. BNP: Invalid input(s): POCBNP CBG: Recent Labs  Lab 04/23/23 0726 04/23/23 1157 04/23/23 1706 04/23/23 2117 04/24/23 0735  GLUCAP 135* 152* 119* 148* 132*   D-Dimer No results for input(s): DDIMER in the last 72 hours. Hgb A1c No results for input(s): HGBA1C in the last 72 hours. Lipid Profile No results for input(s): CHOL, HDL, LDLCALC, TRIG, CHOLHDL, LDLDIRECT in the last 72 hours. Thyroid  function studies No results for input(s): TSH, T4TOTAL, T3FREE, THYROIDAB in the last 72 hours.  Invalid input(s): FREET3 Anemia work up No results for input(s): VITAMINB12, FOLATE, FERRITIN, TIBC, IRON , RETICCTPCT in the last 72 hours. Urinalysis    Component Value  Date/Time   COLORURINE YELLOW 04/14/2023 1738   APPEARANCEUR CLEAR 04/14/2023 1738   LABSPEC 1.014 04/14/2023 1738   PHURINE 5.0 04/14/2023 1738   GLUCOSEU NEGATIVE 04/14/2023 1738   HGBUR SMALL (A) 04/14/2023 1738   HGBUR negative 05/09/2010 0754   BILIRUBINUR NEGATIVE 04/14/2023 1738   BILIRUBINUR Negative 12/26/2022 1626   KETONESUR NEGATIVE 04/14/2023 1738   PROTEINUR NEGATIVE 04/14/2023 1738   UROBILINOGEN 0.2 12/26/2022 1626   UROBILINOGEN 0.2 09/03/2012 1353   NITRITE NEGATIVE 04/14/2023 1738   LEUKOCYTESUR NEGATIVE 04/14/2023 1738   Sepsis Labs Recent Labs  Lab 04/20/23 0542 04/21/23 0848  WBC 7.0 7.1   Microbiology Recent Results (from the past 240 hours)  Resp panel by RT-PCR (RSV, Flu A&B, Covid) Anterior Nasal Swab     Status: Abnormal   Collection Time: 04/14/23  9:56 AM   Specimen: Anterior Nasal Swab  Result Value Ref Range Status   SARS Coronavirus 2 by RT PCR NEGATIVE NEGATIVE Final    Comment: (NOTE) SARS-CoV-2 target nucleic acids are NOT DETECTED.  The SARS-CoV-2 RNA is generally detectable in upper respiratory specimens during the acute phase of infection. The lowest concentration of SARS-CoV-2 viral copies this assay can detect is 138 copies/mL. A negative result does not preclude SARS-Cov-2 infection and should not be used as the sole basis for treatment or other patient management decisions. A negative result may occur with  improper specimen collection/handling, submission of specimen other than nasopharyngeal swab, presence of viral mutation(s) within the areas targeted by this assay, and inadequate number of viral copies(<138 copies/mL). A negative result must be combined with clinical observations, patient history, and epidemiological information. The expected result is Negative.  Fact Sheet for Patients:  bloggercourse.com  Fact Sheet for Healthcare Providers:  seriousbroker.it  This  test is no t yet approved or cleared by the [place]  FDA and  has been authorized for detection and/or diagnosis of SARS-CoV-2 by FDA under an Emergency Use Authorization (EUA). This EUA will remain  in effect (meaning this test can be used) for the duration of the COVID-19 declaration under Section 564(b)(1) of the Act, 21 U.S.C.section 360bbb-3(b)(1), unless the authorization is terminated  or revoked sooner.  Influenza A by PCR POSITIVE (A) NEGATIVE Final   Influenza B by PCR NEGATIVE NEGATIVE Final    Comment: (NOTE) The Xpert Xpress SARS-CoV-2/FLU/RSV plus assay is intended as an aid in the diagnosis of influenza from Nasopharyngeal swab specimens and should not be used as a sole basis for treatment. Nasal washings and aspirates are unacceptable for Xpert Xpress SARS-CoV-2/FLU/RSV testing.  Fact Sheet for Patients: bloggercourse.com  Fact Sheet for Healthcare Providers: seriousbroker.it  This test is not yet approved or cleared by the [place]  FDA and has been authorized for detection and/or diagnosis of SARS-CoV-2 by FDA under an Emergency Use Authorization (EUA). This EUA will remain in effect (meaning this test can be used) for the duration of the COVID-19 declaration under Section 564(b)(1) of the Act, 21 U.S.C. section 360bbb-3(b)(1), unless the authorization is terminated or revoked.     Resp Syncytial Virus by PCR NEGATIVE NEGATIVE Final    Comment: (NOTE) Fact Sheet for Patients: bloggercourse.com  Fact Sheet for Healthcare Providers: seriousbroker.it  This test is not yet approved or cleared by the [place]  FDA and has been authorized for detection and/or diagnosis of SARS-CoV-2 by FDA under an Emergency Use Authorization (EUA). This EUA will remain in effect (meaning this test can be used) for the duration of the COVID-19 declaration under  Section 564(b)(1) of the Act, 21 U.S.C. section 360bbb-3(b)(1), unless the authorization is terminated or revoked.  Performed at [facility], [address] [place], [place] [address]      Time coordinating discharge:  I have spent 35 minutes face to face with the patient and on the ward discussing the patients care, assessment, plan and disposition with other care givers. >50% of the time was devoted counseling the patient about the risks and benefits of treatment/Discharge disposition and coordinating care.   SIGNED:   Burgess JAYSON Dare, MD  [facility] 04/24/2023, 9:50 AM   If 7PM-7AM, please contact night-coverage

## 2023-04-24 NOTE — Progress Notes (Signed)
 This nurse called [facility] to give report. Report given and RN at [facility] aware that transport has been called.

## 2023-04-24 NOTE — TOC Transition Note (Signed)
 Transition of Care [facility]) - Discharge Note   Patient Details  Name: Steven [name] MRN: [id number] Date of Birth: 29-Dec-1930  [facility]) CM/SW Contact:  Hoy DELENA Bigness, LCSW Phone Number: 04/24/2023, 11:42 AM   Clinical Narrative:    Pt's insurance auth approved for SNF. Pt able to transfer to [facility] for rehab today. Pt will be going to room 111. RN to call report to [phone]. Spoke with pt's daughter to inform of DC. DC packet placed at RN station. [facility] called at 11:30am for transport.    Final next level of care: Skilled Nursing Facility Barriers to Discharge: Barriers Resolved   Patient Goals and CMS Choice Patient states their goals for this hospitalization and ongoing recovery are:: pt's dtr [name] says he will d/c to SNF CMS Medicare.gov Compare Post Acute Care list provided to:: Patient Represenative (must comment) [name] (dtr))   [place] ownership interest in [facility] Dt.provided to:: Patient    Discharge Placement   Existing PASRR number confirmed : 04/20/23          Patient chooses bed at: [facility] Patient to be transferred to facility by: [facility] Name of family member notified: Daughter Patient and family notified of of transfer: 04/24/23  Discharge Plan and Services Additional resources added to the After Visit Summary for     Discharge Planning Services: CM Consult Post Acute Care Choice: Skilled Nursing Facility          DME Arranged: N/A DME Agency: NA                  Social Drivers of Health (SDOH) Interventions SDOH Screenings   Food Insecurity: No Food Insecurity (04/20/2023)  Housing: Low Risk  (04/20/2023)  Transportation Needs: No Transportation Needs (04/20/2023)  Utilities: Not At Risk (04/20/2023)  Depression (PHQ2-9): Low Risk  (12/25/2022)  Social Connections: Unknown (04/16/2023)  Tobacco Use: Low Risk  (04/16/2023)     Readmission Risk Interventions    04/24/2023   11:39 AM   Readmission Risk Prevention Plan  Transportation Screening Complete  PCP or Specialist Appt within 5-7 Days Complete  Home Care Screening Complete  Medication Review (RN CM) Complete

## 2023-04-24 NOTE — Plan of Care (Signed)
  Problem: Clinical Measurements: Goal: Ability to maintain clinical measurements within normal limits will improve Outcome: Progressing Goal: Will remain free from infection Outcome: Progressing Goal: Diagnostic test results will improve Outcome: Progressing Goal: Respiratory complications will improve Outcome: Progressing Goal: Cardiovascular complication will be avoided Outcome: Progressing   Problem: Activity: Goal: Risk for activity intolerance will decrease Outcome: Progressing   Problem: Nutrition: Goal: Adequate nutrition will be maintained Outcome: Progressing   Problem: Elimination: Goal: Will not experience complications related to bowel motility Outcome: Progressing Goal: Will not experience complications related to urinary retention Outcome: Progressing   Problem: Education: Goal: Knowledge of General Education information will improve Description: Including pain rating scale, medication(s)/side effects and non-pharmacologic comfort measures Outcome: Not Progressing   Problem: Health Behavior/Discharge Planning: Goal: Ability to manage health-related needs will improve Outcome: Not Progressing   Problem: Coping: Goal: Level of anxiety will decrease Outcome: Not Progressing

## 2023-04-25 DIAGNOSIS — R531 Weakness: Secondary | ICD-10-CM | POA: Diagnosis not present

## 2023-04-25 DIAGNOSIS — I2510 Atherosclerotic heart disease of native coronary artery without angina pectoris: Secondary | ICD-10-CM | POA: Diagnosis not present

## 2023-04-25 DIAGNOSIS — E119 Type 2 diabetes mellitus without complications: Secondary | ICD-10-CM | POA: Diagnosis not present

## 2023-04-25 DIAGNOSIS — E785 Hyperlipidemia, unspecified: Secondary | ICD-10-CM | POA: Diagnosis not present

## 2023-04-25 DIAGNOSIS — G629 Polyneuropathy, unspecified: Secondary | ICD-10-CM | POA: Diagnosis not present

## 2023-04-25 DIAGNOSIS — R627 Adult failure to thrive: Secondary | ICD-10-CM | POA: Diagnosis not present

## 2023-04-26 DIAGNOSIS — R2681 Unsteadiness on feet: Secondary | ICD-10-CM | POA: Diagnosis not present

## 2023-04-26 DIAGNOSIS — G309 Alzheimer's disease, unspecified: Secondary | ICD-10-CM | POA: Diagnosis not present

## 2023-04-26 DIAGNOSIS — M6281 Muscle weakness (generalized): Secondary | ICD-10-CM | POA: Diagnosis not present

## 2023-04-26 DIAGNOSIS — L97919 Non-pressure chronic ulcer of unspecified part of right lower leg with unspecified severity: Secondary | ICD-10-CM | POA: Diagnosis not present

## 2023-04-26 DIAGNOSIS — E119 Type 2 diabetes mellitus without complications: Secondary | ICD-10-CM | POA: Diagnosis not present

## 2023-04-26 DIAGNOSIS — I2510 Atherosclerotic heart disease of native coronary artery without angina pectoris: Secondary | ICD-10-CM | POA: Diagnosis not present

## 2023-04-30 DIAGNOSIS — L89896 Pressure-induced deep tissue damage of other site: Secondary | ICD-10-CM | POA: Diagnosis not present

## 2023-04-30 DIAGNOSIS — M6281 Muscle weakness (generalized): Secondary | ICD-10-CM | POA: Diagnosis not present

## 2023-04-30 DIAGNOSIS — E440 Moderate protein-calorie malnutrition: Secondary | ICD-10-CM | POA: Diagnosis not present

## 2023-04-30 DIAGNOSIS — L89516 Pressure-induced deep tissue damage of right ankle: Secondary | ICD-10-CM | POA: Diagnosis not present

## 2023-04-30 DIAGNOSIS — R2681 Unsteadiness on feet: Secondary | ICD-10-CM | POA: Diagnosis not present

## 2023-04-30 DIAGNOSIS — L988 Other specified disorders of the skin and subcutaneous tissue: Secondary | ICD-10-CM | POA: Diagnosis not present

## 2023-05-03 DIAGNOSIS — M6281 Muscle weakness (generalized): Secondary | ICD-10-CM | POA: Diagnosis not present

## 2023-05-03 DIAGNOSIS — R2681 Unsteadiness on feet: Secondary | ICD-10-CM | POA: Diagnosis not present

## 2023-05-03 DIAGNOSIS — E440 Moderate protein-calorie malnutrition: Secondary | ICD-10-CM | POA: Diagnosis not present

## 2023-05-04 DIAGNOSIS — R531 Weakness: Secondary | ICD-10-CM | POA: Diagnosis not present

## 2023-05-04 DIAGNOSIS — G629 Polyneuropathy, unspecified: Secondary | ICD-10-CM | POA: Diagnosis not present

## 2023-05-07 DIAGNOSIS — M6281 Muscle weakness (generalized): Secondary | ICD-10-CM | POA: Diagnosis not present

## 2023-05-07 DIAGNOSIS — E440 Moderate protein-calorie malnutrition: Secondary | ICD-10-CM | POA: Diagnosis not present

## 2023-05-07 DIAGNOSIS — L89896 Pressure-induced deep tissue damage of other site: Secondary | ICD-10-CM | POA: Diagnosis not present

## 2023-05-07 DIAGNOSIS — R2681 Unsteadiness on feet: Secondary | ICD-10-CM | POA: Diagnosis not present

## 2023-05-07 DIAGNOSIS — L89516 Pressure-induced deep tissue damage of right ankle: Secondary | ICD-10-CM | POA: Diagnosis not present

## 2023-05-07 DIAGNOSIS — L89892 Pressure ulcer of other site, stage 2: Secondary | ICD-10-CM | POA: Diagnosis not present

## 2023-05-08 DIAGNOSIS — R2681 Unsteadiness on feet: Secondary | ICD-10-CM | POA: Diagnosis not present

## 2023-05-08 DIAGNOSIS — M6281 Muscle weakness (generalized): Secondary | ICD-10-CM | POA: Diagnosis not present

## 2023-05-08 NOTE — Progress Notes (Signed)
 This encounter was created in error - please disregard.

## 2023-05-09 ENCOUNTER — Encounter: Payer: Self-pay | Admitting: Family Medicine

## 2023-05-09 ENCOUNTER — Telehealth: Payer: Medicare Other | Admitting: Family Medicine

## 2023-05-09 DIAGNOSIS — M6281 Muscle weakness (generalized): Secondary | ICD-10-CM | POA: Diagnosis not present

## 2023-05-09 DIAGNOSIS — R2681 Unsteadiness on feet: Secondary | ICD-10-CM | POA: Diagnosis not present

## 2023-05-09 NOTE — Progress Notes (Signed)
Patient scheduled with for a virtual visit this morning for hospital follow up by the E2C2 with patient's sister.  I did briefly review patient's chart.  Patient was admitted 112/29/2024 for flu and failure to thrive.  He was treated with 5 days of Tamiflu.  Was discharged to SNF due to generalized weakness.  He has been at [facility] since discharge.    His sister today states that they are ending his services early and that he will be discharged soon.  He is still currently in the facility.  He is still not able to walk and she thinks that he still needs to be in a nursing facility.  She would like for him to be re-evaluated by a physician so that he can continue services. He has been working with physical therapy and did well initially however he has not been compliant with therapy the last few days and they are discharging him due to failure to make progress.  Patient is currently in the nursing home and was not with his sister during today's virtual visit.  Due to his sister not being on DPR, I was not able to go into any protected health information however did discuss process for continued placement in general.  Discussed that if he were being discharged and needed immediate assistance that he would unfortunately have to go back to the emergency department for reevaluation as we do not have admitting privileges to the hospital or to skilled nursing facility.  We also did discuss that his PCP could place a referral to care management for placement as an outpatient however this process could take several weeks for them to find placement.   She did have concerns about his ongoing combativeness, confusion, and hip pain.  As above, I did not go into detail with this due to her not being on the [facility] and also due to patient not being present for today's virtual visit. Advised him to follow-up with PCP soon for a face to face visit.

## 2023-05-10 DIAGNOSIS — M6281 Muscle weakness (generalized): Secondary | ICD-10-CM | POA: Diagnosis not present

## 2023-05-10 DIAGNOSIS — I10 Essential (primary) hypertension: Secondary | ICD-10-CM | POA: Diagnosis not present

## 2023-05-10 DIAGNOSIS — E119 Type 2 diabetes mellitus without complications: Secondary | ICD-10-CM | POA: Diagnosis not present

## 2023-05-10 DIAGNOSIS — L89216 Pressure-induced deep tissue damage of right hip: Secondary | ICD-10-CM | POA: Diagnosis not present

## 2023-05-10 DIAGNOSIS — R2681 Unsteadiness on feet: Secondary | ICD-10-CM | POA: Diagnosis not present

## 2023-05-10 DIAGNOSIS — I2510 Atherosclerotic heart disease of native coronary artery without angina pectoris: Secondary | ICD-10-CM | POA: Diagnosis not present

## 2023-05-11 DIAGNOSIS — M6281 Muscle weakness (generalized): Secondary | ICD-10-CM | POA: Diagnosis not present

## 2023-05-11 DIAGNOSIS — R2681 Unsteadiness on feet: Secondary | ICD-10-CM | POA: Diagnosis not present

## 2023-05-14 DIAGNOSIS — R2681 Unsteadiness on feet: Secondary | ICD-10-CM | POA: Diagnosis not present

## 2023-05-14 DIAGNOSIS — L89892 Pressure ulcer of other site, stage 2: Secondary | ICD-10-CM | POA: Diagnosis not present

## 2023-05-14 DIAGNOSIS — L89516 Pressure-induced deep tissue damage of right ankle: Secondary | ICD-10-CM | POA: Diagnosis not present

## 2023-05-14 DIAGNOSIS — M6281 Muscle weakness (generalized): Secondary | ICD-10-CM | POA: Diagnosis not present

## 2023-05-14 DIAGNOSIS — L89896 Pressure-induced deep tissue damage of other site: Secondary | ICD-10-CM | POA: Diagnosis not present

## 2023-05-15 DIAGNOSIS — M6281 Muscle weakness (generalized): Secondary | ICD-10-CM | POA: Diagnosis not present

## 2023-05-15 DIAGNOSIS — R0989 Other specified symptoms and signs involving the circulatory and respiratory systems: Secondary | ICD-10-CM | POA: Diagnosis not present

## 2023-05-15 DIAGNOSIS — D649 Anemia, unspecified: Secondary | ICD-10-CM | POA: Diagnosis not present

## 2023-05-15 DIAGNOSIS — E119 Type 2 diabetes mellitus without complications: Secondary | ICD-10-CM | POA: Diagnosis not present

## 2023-05-15 DIAGNOSIS — G309 Alzheimer's disease, unspecified: Secondary | ICD-10-CM | POA: Diagnosis not present

## 2023-05-15 DIAGNOSIS — R2681 Unsteadiness on feet: Secondary | ICD-10-CM | POA: Diagnosis not present

## 2023-05-16 DIAGNOSIS — R2681 Unsteadiness on feet: Secondary | ICD-10-CM | POA: Diagnosis not present

## 2023-05-16 DIAGNOSIS — M6281 Muscle weakness (generalized): Secondary | ICD-10-CM | POA: Diagnosis not present

## 2023-05-17 DIAGNOSIS — M6281 Muscle weakness (generalized): Secondary | ICD-10-CM | POA: Diagnosis not present

## 2023-05-17 DIAGNOSIS — R2681 Unsteadiness on feet: Secondary | ICD-10-CM | POA: Diagnosis not present

## 2023-05-18 DIAGNOSIS — R2681 Unsteadiness on feet: Secondary | ICD-10-CM | POA: Diagnosis not present

## 2023-05-18 DIAGNOSIS — M6281 Muscle weakness (generalized): Secondary | ICD-10-CM | POA: Diagnosis not present

## 2023-05-21 DIAGNOSIS — R2681 Unsteadiness on feet: Secondary | ICD-10-CM | POA: Diagnosis not present

## 2023-05-21 DIAGNOSIS — L89516 Pressure-induced deep tissue damage of right ankle: Secondary | ICD-10-CM | POA: Diagnosis not present

## 2023-05-21 DIAGNOSIS — M6281 Muscle weakness (generalized): Secondary | ICD-10-CM | POA: Diagnosis not present

## 2023-05-21 DIAGNOSIS — L89892 Pressure ulcer of other site, stage 2: Secondary | ICD-10-CM | POA: Diagnosis not present

## 2023-05-21 DIAGNOSIS — L89896 Pressure-induced deep tissue damage of other site: Secondary | ICD-10-CM | POA: Diagnosis not present

## 2023-05-22 DIAGNOSIS — M6281 Muscle weakness (generalized): Secondary | ICD-10-CM | POA: Diagnosis not present

## 2023-05-22 DIAGNOSIS — R2681 Unsteadiness on feet: Secondary | ICD-10-CM | POA: Diagnosis not present

## 2023-05-23 DIAGNOSIS — M6281 Muscle weakness (generalized): Secondary | ICD-10-CM | POA: Diagnosis not present

## 2023-05-23 DIAGNOSIS — R2681 Unsteadiness on feet: Secondary | ICD-10-CM | POA: Diagnosis not present

## 2023-05-24 DIAGNOSIS — M6281 Muscle weakness (generalized): Secondary | ICD-10-CM | POA: Diagnosis not present

## 2023-05-24 DIAGNOSIS — R2681 Unsteadiness on feet: Secondary | ICD-10-CM | POA: Diagnosis not present

## 2023-05-25 DIAGNOSIS — M6281 Muscle weakness (generalized): Secondary | ICD-10-CM | POA: Diagnosis not present

## 2023-05-25 DIAGNOSIS — R2681 Unsteadiness on feet: Secondary | ICD-10-CM | POA: Diagnosis not present

## 2023-05-28 DIAGNOSIS — L89890 Pressure ulcer of other site, unstageable: Secondary | ICD-10-CM | POA: Diagnosis not present

## 2023-05-28 DIAGNOSIS — L89896 Pressure-induced deep tissue damage of other site: Secondary | ICD-10-CM | POA: Diagnosis not present

## 2023-05-28 DIAGNOSIS — M6281 Muscle weakness (generalized): Secondary | ICD-10-CM | POA: Diagnosis not present

## 2023-05-28 DIAGNOSIS — R2681 Unsteadiness on feet: Secondary | ICD-10-CM | POA: Diagnosis not present

## 2023-05-28 DIAGNOSIS — L89512 Pressure ulcer of right ankle, stage 2: Secondary | ICD-10-CM | POA: Diagnosis not present

## 2023-05-29 DIAGNOSIS — M6281 Muscle weakness (generalized): Secondary | ICD-10-CM | POA: Diagnosis not present

## 2023-05-29 DIAGNOSIS — R2681 Unsteadiness on feet: Secondary | ICD-10-CM | POA: Diagnosis not present

## 2023-05-30 DIAGNOSIS — M6281 Muscle weakness (generalized): Secondary | ICD-10-CM | POA: Diagnosis not present

## 2023-05-30 DIAGNOSIS — R2681 Unsteadiness on feet: Secondary | ICD-10-CM | POA: Diagnosis not present

## 2023-05-31 DIAGNOSIS — M6281 Muscle weakness (generalized): Secondary | ICD-10-CM | POA: Diagnosis not present

## 2023-05-31 DIAGNOSIS — R2681 Unsteadiness on feet: Secondary | ICD-10-CM | POA: Diagnosis not present

## 2023-06-01 DIAGNOSIS — R2681 Unsteadiness on feet: Secondary | ICD-10-CM | POA: Diagnosis not present

## 2023-06-01 DIAGNOSIS — M6281 Muscle weakness (generalized): Secondary | ICD-10-CM | POA: Diagnosis not present

## 2023-06-04 DIAGNOSIS — L89890 Pressure ulcer of other site, unstageable: Secondary | ICD-10-CM | POA: Diagnosis not present

## 2023-06-04 DIAGNOSIS — M6281 Muscle weakness (generalized): Secondary | ICD-10-CM | POA: Diagnosis not present

## 2023-06-04 DIAGNOSIS — L89512 Pressure ulcer of right ankle, stage 2: Secondary | ICD-10-CM | POA: Diagnosis not present

## 2023-06-04 DIAGNOSIS — R2681 Unsteadiness on feet: Secondary | ICD-10-CM | POA: Diagnosis not present

## 2023-06-04 DIAGNOSIS — L89893 Pressure ulcer of other site, stage 3: Secondary | ICD-10-CM | POA: Diagnosis not present

## 2023-06-05 DIAGNOSIS — E119 Type 2 diabetes mellitus without complications: Secondary | ICD-10-CM | POA: Diagnosis not present

## 2023-06-05 DIAGNOSIS — R2681 Unsteadiness on feet: Secondary | ICD-10-CM | POA: Diagnosis not present

## 2023-06-05 DIAGNOSIS — I10 Essential (primary) hypertension: Secondary | ICD-10-CM | POA: Diagnosis not present

## 2023-06-05 DIAGNOSIS — M6281 Muscle weakness (generalized): Secondary | ICD-10-CM | POA: Diagnosis not present

## 2023-06-05 DIAGNOSIS — E785 Hyperlipidemia, unspecified: Secondary | ICD-10-CM | POA: Diagnosis not present

## 2023-06-06 DIAGNOSIS — R2681 Unsteadiness on feet: Secondary | ICD-10-CM | POA: Diagnosis not present

## 2023-06-06 DIAGNOSIS — M6281 Muscle weakness (generalized): Secondary | ICD-10-CM | POA: Diagnosis not present

## 2023-06-07 DIAGNOSIS — R2681 Unsteadiness on feet: Secondary | ICD-10-CM | POA: Diagnosis not present

## 2023-06-07 DIAGNOSIS — M6281 Muscle weakness (generalized): Secondary | ICD-10-CM | POA: Diagnosis not present

## 2023-06-08 DIAGNOSIS — R2681 Unsteadiness on feet: Secondary | ICD-10-CM | POA: Diagnosis not present

## 2023-06-08 DIAGNOSIS — M6281 Muscle weakness (generalized): Secondary | ICD-10-CM | POA: Diagnosis not present

## 2023-06-09 DIAGNOSIS — R2681 Unsteadiness on feet: Secondary | ICD-10-CM | POA: Diagnosis not present

## 2023-06-09 DIAGNOSIS — M6281 Muscle weakness (generalized): Secondary | ICD-10-CM | POA: Diagnosis not present

## 2023-06-11 DIAGNOSIS — L89890 Pressure ulcer of other site, unstageable: Secondary | ICD-10-CM | POA: Diagnosis not present

## 2023-06-11 DIAGNOSIS — L89512 Pressure ulcer of right ankle, stage 2: Secondary | ICD-10-CM | POA: Diagnosis not present

## 2023-06-11 DIAGNOSIS — M6281 Muscle weakness (generalized): Secondary | ICD-10-CM | POA: Diagnosis not present

## 2023-06-11 DIAGNOSIS — R2681 Unsteadiness on feet: Secondary | ICD-10-CM | POA: Diagnosis not present

## 2023-06-11 DIAGNOSIS — L89896 Pressure-induced deep tissue damage of other site: Secondary | ICD-10-CM | POA: Diagnosis not present

## 2023-06-18 ENCOUNTER — Telehealth: Payer: Self-pay | Admitting: Family Medicine

## 2023-06-18 NOTE — Telephone Encounter (Signed)
 Can you guys assist pt daughter in next steps/paperwork that she may need to complete for this?

## 2023-06-18 NOTE — Telephone Encounter (Unsigned)
 Copied from CRM [id number]. Topic: General - Other >> Jun 18, 2023 10:53 AM Danika B wrote: Reason for CRM: Patients daughter Misty Stanley who is POA called in to inquire about next steps if her father is unable to make decisions, sign documents, or considered incapacitated due to medical conditions/state. States that POA only goes so far when it comes to other documents, insurance, and financial institutions. Callback [phone]

## 2023-07-23 ENCOUNTER — Telehealth: Payer: Self-pay

## 2023-07-23 NOTE — Telephone Encounter (Signed)
 Copied from CRM [id number]. Topic: Clinical - Home Health Verbal Orders >> Jul 23, 2023  9:44 AM Georgeanna Harrison H wrote: Caller/Agency: Rayna Sexton Hall/ [facility] Callback Number: [phone] Service Requested: Physical Therapy Frequency:  1x a week for 5 weeks Any new concerns about the patient? No   I returned call from Juan Quam at [facility]. Verbal orders given.

## 2023-07-24 ENCOUNTER — Telehealth: Payer: Self-pay | Admitting: Family Medicine

## 2023-07-24 NOTE — Telephone Encounter (Signed)
 Rinaldo Cloud returned call. States Ph# [phone] is a confidential voice mail to leave verbal orders on.

## 2023-07-24 NOTE — Telephone Encounter (Unsigned)
 Copied from CRM [id number]. Topic: Clinical - Home Health Verbal Orders >> Jul 24, 2023 11:33 AM Ernst Spell wrote: Caller/Agency: [facility] Callback Number: [phone] Service Requested: Skilled Nursing Frequency: 2x week for 6 weeks, 1x week for 3 weeks Any new concerns about the patient? No

## 2023-07-24 NOTE — Telephone Encounter (Signed)
 Called and spoke with Steven [name] and VO given.

## 2023-07-24 NOTE — Telephone Encounter (Signed)
 Called the number provided but this was not a confidential vm so I left vm tcb if it was infact Steven [name]'s vm.

## 2023-07-31 ENCOUNTER — Telehealth: Payer: Self-pay | Admitting: Family Medicine

## 2023-07-31 NOTE — Telephone Encounter (Signed)
 [facility] faxed Home Health Certificate (Order [place] [id number]), to be filled out by provider. Patient requested to send it back via Fax within 5-days. Document is located in providers tray at front office.Please advise at  [phone]

## 2023-08-01 ENCOUNTER — Emergency Department (HOSPITAL_COMMUNITY)
Admission: EM | Admit: 2023-08-01 | Discharge: 2023-08-02 | Disposition: A | Discharge status: Home / Self Care | Attending: Emergency Medicine | Admitting: Emergency Medicine

## 2023-08-01 ENCOUNTER — Encounter (HOSPITAL_COMMUNITY): Payer: Self-pay

## 2023-08-01 DIAGNOSIS — Z79899 Other long term (current) drug therapy: Secondary | ICD-10-CM | POA: Diagnosis not present

## 2023-08-01 DIAGNOSIS — R451 Restlessness and agitation: Secondary | ICD-10-CM | POA: Diagnosis not present

## 2023-08-01 DIAGNOSIS — I10 Essential (primary) hypertension: Secondary | ICD-10-CM | POA: Diagnosis not present

## 2023-08-01 DIAGNOSIS — S61412A Laceration without foreign body of left hand, initial encounter: Secondary | ICD-10-CM | POA: Diagnosis not present

## 2023-08-01 DIAGNOSIS — R410 Disorientation, unspecified: Secondary | ICD-10-CM | POA: Insufficient documentation

## 2023-08-01 DIAGNOSIS — Z7984 Long term (current) use of oral hypoglycemic drugs: Secondary | ICD-10-CM | POA: Insufficient documentation

## 2023-08-01 DIAGNOSIS — Z7982 Long term (current) use of aspirin: Secondary | ICD-10-CM | POA: Diagnosis not present

## 2023-08-01 DIAGNOSIS — I2510 Atherosclerotic heart disease of native coronary artery without angina pectoris: Secondary | ICD-10-CM | POA: Diagnosis not present

## 2023-08-01 DIAGNOSIS — S6991XA Unspecified injury of right wrist, hand and finger(s), initial encounter: Secondary | ICD-10-CM | POA: Diagnosis present

## 2023-08-01 DIAGNOSIS — E1165 Type 2 diabetes mellitus with hyperglycemia: Secondary | ICD-10-CM | POA: Diagnosis not present

## 2023-08-01 DIAGNOSIS — S61411A Laceration without foreign body of right hand, initial encounter: Secondary | ICD-10-CM | POA: Insufficient documentation

## 2023-08-01 DIAGNOSIS — T148XXA Other injury of unspecified body region, initial encounter: Secondary | ICD-10-CM

## 2023-08-01 DIAGNOSIS — E1140 Type 2 diabetes mellitus with diabetic neuropathy, unspecified: Secondary | ICD-10-CM | POA: Diagnosis not present

## 2023-08-01 DIAGNOSIS — F0390 Unspecified dementia without behavioral disturbance: Secondary | ICD-10-CM | POA: Insufficient documentation

## 2023-08-01 DIAGNOSIS — X58XXXA Exposure to other specified factors, initial encounter: Secondary | ICD-10-CM | POA: Diagnosis not present

## 2023-08-01 DIAGNOSIS — R456 Violent behavior: Secondary | ICD-10-CM | POA: Diagnosis not present

## 2023-08-01 DIAGNOSIS — Z955 Presence of coronary angioplasty implant and graft: Secondary | ICD-10-CM | POA: Diagnosis not present

## 2023-08-01 MED ORDER — BACITRACIN ZINC 500 UNIT/GM EX OINT
TOPICAL_OINTMENT | CUTANEOUS | Status: AC
  Filled 2023-08-01: qty 0.9

## 2023-08-01 NOTE — ED Provider Notes (Incomplete)
 [place] EMERGENCY DEPARTMENT AT [facility] Provider Note  CSN: [id number] Arrival date & time: 08/01/23 2133  Chief Complaint(s) Aggressive Behavior Pt from [facility] BIB [facility] for aggressive behavior for the past few days. Staff reported that pt was "swing at them", EMS stated that they seen staff laughing at pt with his aggressive behavior, in a sense to be instigation and encouraging the behavior". Staff would like pt eval for a pysh eval. PT is calm and corporative at this time. Denies SI/HI. Staff is wanting to get a UA from pt while at facility, but pt had refused that  EMS were not able to get vitals, but was not aggressive with EMS, just did not want vitals taken.  HPI SHADD DUNSTAN is a 89 y.o. male here for reportedly being aggressive toward facility staff.   HPI  Past Medical History Past Medical History:  Diagnosis Date  . Abnormality of gait 08/23/2012  . Anemia    hx of  . Arthritis 01-03-12   osteoarthritis-knees  . CAD (coronary artery disease) 2004   s/p inferior wall Mi 2004 with PTCA/Stent; s/p CABG 2004  . Degenerative arthritis   . Diabetes mellitus    type II  . Foot drop, bilateral 08/23/2012  . GERD (gastroesophageal reflux disease)   . Heart disease   . History of kidney stones   . History of myocardial infarction   . Hyperlipidemia   . Myocardial infarction [facility]) 01-03-12   '04  . MYOCARDIAL INFARCTION, HX OF 04/19/2007   Qualifier: Diagnosis of  By: Georgie Kiss    . Neuropathy 01-03-12   bil. feet  . Pneumonia    hx of  . Polyneuropathy in diabetes(357.2) 08/23/2012  . Radiculopathy of lumbar region 08/23/2011   Followed by Dr. Nigel Bart. S/p surgery. Resolved after surgery.     Patient Active Problem List   Diagnosis Date Noted  . Influenza A 04/15/2023  . Aortic atherosclerosis (HCC) 08/30/2022  . Dementia (HCC) 08/30/2021  . BPH (benign prostatic hyperplasia) 09/18/2019  . AAA (abdominal aortic aneurysm) (HCC)  12/13/2018  . Hx of CABG 12/13/2017  . PVC's (premature ventricular contractions) 12/13/2017  . Seborrheic dermatitis 09/13/2017  . Falls frequently 08/21/2017  . Aortic root dilatation (HCC) 10/06/2016  . History of acute inferior wall MI 09/03/2015  . Osteoporosis 01/07/2015  . Anemia 09/03/2013  . Essential hypertension, benign 10/11/2012  . Foot drop, left 08/23/2012  . History of supraventricular tachycardia 01/25/2011  . Hypogonadism in male 02/04/2010  . History of colonic polyps 09/07/2009  . CAD, ARTERY BYPASS GRAFT 05/21/2009  . Carotid artery stenosis 04/20/2008  . Diabetes mellitus type II, controlled (HCC) 04/19/2007  . Hyperlipidemia 04/19/2007  . GERD 04/19/2007   Home Medication(s) Prior to Admission medications   Medication Sig Start Date End Date Taking? Authorizing Provider  acetaminophen (TYLENOL) 500 MG tablet Take 500-1,000 mg by mouth every 8 (eight) hours as needed for mild pain (pain score 1-3) (not to exceed a sum total of 3,000 mg/day).   Yes Serpe, Willard Harman, NP  Amino Acids-Protein Hydrolys (FEEDING SUPPLEMENT, PRO-STAT SUGAR FREE 64,) LIQD Take 30 mLs by mouth 2 (two) times daily.   Yes [provider]  aspirin 81 MG tablet Take 81 mg by mouth daily.   Yes [provider]  atorvastatin (LIPITOR) 80 MG tablet TAKE 1 TABLET DAILY. 08/30/21  Yes Almira Jaeger, MD  Cholecalciferol (VITAMIN D3 GUMMIES) 25 MCG (1000 UT) CHEW Chew 1 tablet  by mouth daily.   Yes [provider]  Continuous Glucose Receiver (FREESTYLE LIBRE 14 DAY READER) DEVI 1 device as directed as directed  1 reader for DM2 08/01/23  Yes [provider]  Continuous Glucose Sensor (FREESTYLE LIBRE 14 DAY SENSOR) MISC SMARTSIG:1 device Topical As Directed 08/01/23  Yes [provider]  folic acid (FOLVITE) 400 MCG tablet Take 400 mcg by mouth daily.   Yes [provider]  gabapentin (NEURONTIN) 100 MG capsule Take 1 capsule (100 mg total) by  mouth at bedtime. 11/22/21  Yes Syliva Even, MD  metFORMIN (GLUCOPHAGE) 1000 MG tablet TAKE 1 TABLET (1,000 MG TOTAL) BY MOUTH TWICE A DAY WITH FOOD Patient taking differently: Take 500 mg by mouth daily with breakfast. 08/24/22  Yes Almira Jaeger, MD  metoprolol succinate (TOPROL-XL) 50 MG 24 hr tablet TAKE 1 TABLET BY MOUTH DAILY. TAKE WITH OR IMMEDIATELY FOLLOWING A MEAL. 10/16/22  Yes Almira Jaeger, MD  Multiple Vitamins-Minerals (DECUBI-VITE) CAPS Take 1 capsule by mouth daily.   Yes [provider]  QUEtiapine (SEROQUEL) 25 MG tablet Take 25 mg by mouth at bedtime.   Yes [provider]  blood glucose meter kit and supplies KIT Dispense based on patient and insurance preference. Use up to four times daily as directed. Patient not taking: Reported on 08/01/2023 03/31/22   Almira Jaeger, MD  testosterone cypionate (DEPO-TESTOSTERONE) 200 MG/ML injection Inject 1 mL (200 mg total) into the muscle every 30 (thirty) days. 12/09/10 01/27/11  Swords, Dorathy Gals, MD                                                                                                                                    Allergies Alendronate and Metformin and related  Review of Systems Review of Systems As noted in HPI  Physical Exam Vital Signs  I have reviewed the triage vital signs BP 109/65 (BP Location: Right Arm)   Pulse 91   Temp 98.3 F (36.8 C)   Resp 16   Ht 5\' 9"  (1.753 m)   Wt 66.5 kg   SpO2 99%   BMI 21.65 kg/m   Physical Exam Vitals reviewed.  Constitutional:      General: He is not in acute distress.    Appearance: He is well-developed. He is not diaphoretic.  HENT:     Head: Normocephalic and atraumatic.     Right Ear: External ear normal.     Left Ear: External ear normal.     Nose: Nose normal.     Mouth/Throat:     Mouth: Mucous membranes are moist.  Eyes:     General: No scleral icterus.    Conjunctiva/sclera: Conjunctivae normal.  Neck:     Trachea:  Phonation normal.  Cardiovascular:     Rate and Rhythm: Normal rate and regular rhythm.  Pulmonary:     Effort: Pulmonary effort is normal. No  respiratory distress.     Breath sounds: No stridor.  Abdominal:     General: There is no distension.  Musculoskeletal:        General: Normal range of motion.     Cervical back: Normal range of motion.  Skin:    Findings: Ecchymosis present.       Neurological:     Mental Status: He is alert. Mental status is at baseline. He is disoriented.     Comments: Moves all extremities  Psychiatric:        Behavior: Behavior normal.     ED Results and Treatments Labs (all labs ordered are listed, but only abnormal results are displayed) Labs Reviewed  CBC WITH DIFFERENTIAL/PLATELET  BLOOD GAS, VENOUS  COMPREHENSIVE METABOLIC PANEL WITH GFR  URINALYSIS, W/ REFLEX TO CULTURE (INFECTION SUSPECTED)                                                                                                                         EKG  EKG Interpretation Date/Time:    Ventricular Rate:    PR Interval:    QRS Duration:    QT Interval:    QTC Calculation:   R Axis:      Text Interpretation:         Radiology No results found.  Medications Ordered in ED Medications - No data to display Procedures Procedures  (including critical care time) Medical Decision Making / ED Course   Medical Decision Making Amount and/or Complexity of Data Reviewed Labs: ordered.    ***    Final Clinical Impression(s) / ED Diagnoses Final diagnoses:  None    This chart was dictated using voice recognition software.  Despite best efforts to proofread,  errors can occur which can change the documentation meaning.

## 2023-08-01 NOTE — ED Triage Notes (Signed)
 Pt from [facility] BIB [facility] for aggressive behavior for the past few days. Staff reported that pt was "swing at them", EMS stated that they seen staff laughing at pt with his aggressive behavior, in a sense to be instigation and encouraging the behavior". Staff would like pt eval for a pysh eval. PT is calm and corporative at this time. Denies SI/HI. Staff is wanting to get a UA from pt while at facility, but pt had refused that  EMS were not able to get vitals, but was not aggressive with EMS, just did not want vitals taken.

## 2023-08-01 NOTE — ED Provider Notes (Signed)
 [place] EMERGENCY DEPARTMENT AT [facility] Provider Note  CSN: [id number] Arrival date & time: 08/01/23 2133  Chief Complaint(s) Aggressive Behavior Pt from [facility] BIB [facility] for aggressive behavior for the past few days. Staff reported that pt was "swing at them", EMS stated that they seen staff laughing at pt with his aggressive behavior, in a sense to be instigation and encouraging the behavior". Staff would like pt eval for a pysh eval. PT is calm and corporative at this time. Denies SI/HI. Staff is wanting to get a UA from pt while at facility, but pt had refused that  EMS were not able to get vitals, but was not aggressive with EMS, just did not want vitals taken.  HPI Steven [name] is a 89 y.o. male here for reportedly being aggressive toward facility staff.   I spoke to the patient's daughter, Stokes Rattigan who reported that the patient had been there for a few weeks.  The patient has private caregivers as well who have noted that the facility staff "do not know how to take care of him the way he needs."  Daughter is considering transferring patient to another facility.  HPI  Past Medical History Past Medical History:  Diagnosis Date   Abnormality of gait 08/23/2012   Anemia    hx of   Arthritis 01-03-12   osteoarthritis-knees   CAD (coronary artery disease) 2004   s/p inferior wall Mi 2004 with PTCA/Stent; s/p CABG 2004   Degenerative arthritis    Diabetes mellitus    type II   Foot drop, bilateral 08/23/2012   GERD (gastroesophageal reflux disease)    Heart disease    History of kidney stones    History of myocardial infarction    Hyperlipidemia    Myocardial infarction [facility]) 01-03-12   '04   MYOCARDIAL INFARCTION, HX OF 04/19/2007   Qualifier: Diagnosis of  By: Georgie Kiss     Neuropathy 01-03-12   bil. feet   Pneumonia    hx of   Polyneuropathy in diabetes(357.2) 08/23/2012   Radiculopathy of lumbar region 08/23/2011   Followed by Dr.  Nigel Bart. S/p surgery. Resolved after surgery.     Patient Active Problem List   Diagnosis Date Noted   Influenza A 04/15/2023   Aortic atherosclerosis (HCC) 08/30/2022   Dementia (HCC) 08/30/2021   BPH (benign prostatic hyperplasia) 09/18/2019   AAA (abdominal aortic aneurysm) (HCC) 12/13/2018   Hx of CABG 12/13/2017   PVC's (premature ventricular contractions) 12/13/2017   Seborrheic dermatitis 09/13/2017   Falls frequently 08/21/2017   Aortic root dilatation (HCC) 10/06/2016   History of acute inferior wall MI 09/03/2015   Osteoporosis 01/07/2015   Anemia 09/03/2013   Essential hypertension, benign 10/11/2012   Foot drop, left 08/23/2012   History of supraventricular tachycardia 01/25/2011   Hypogonadism in male 02/04/2010   History of colonic polyps 09/07/2009   CAD, ARTERY BYPASS GRAFT 05/21/2009   Carotid artery stenosis 04/20/2008   Diabetes mellitus type II, controlled (HCC) 04/19/2007   Hyperlipidemia 04/19/2007   GERD 04/19/2007   Home Medication(s) Prior to Admission medications   Medication Sig Start Date End Date Taking? Authorizing Provider  acetaminophen (TYLENOL) 500 MG tablet Take 500-1,000 mg by mouth every 8 (eight) hours as needed for mild pain (pain score 1-3) (not to exceed a sum total of 3,000 mg/day).   Yes Serpe, Willard Harman, NP  Amino Acids-Protein Hydrolys (FEEDING SUPPLEMENT, PRO-STAT SUGAR FREE 64,) LIQD Take 30 mLs by  mouth 2 (two) times daily.   Yes [provider]  aspirin 81 MG tablet Take 81 mg by mouth daily.   Yes [provider]  atorvastatin (LIPITOR) 80 MG tablet TAKE 1 TABLET DAILY. 08/30/21  Yes Shelva Majestic, MD  Cholecalciferol (VITAMIN D3 GUMMIES) 25 MCG (1000 UT) CHEW Chew 1 tablet by mouth daily.   Yes [provider]  Continuous Glucose Receiver (FREESTYLE LIBRE 14 DAY READER) DEVI 1 device as directed as directed  1 reader for DM2 08/01/23  Yes [provider]  Continuous Glucose Sensor (FREESTYLE LIBRE  14 DAY SENSOR) MISC SMARTSIG:1 device Topical As Directed 08/01/23  Yes [provider]  folic acid (FOLVITE) 400 MCG tablet Take 400 mcg by mouth daily.   Yes [provider]  gabapentin (NEURONTIN) 100 MG capsule Take 1 capsule (100 mg total) by mouth at bedtime. 11/22/21  Yes Rodolph Bong, MD  metFORMIN (GLUCOPHAGE) 1000 MG tablet TAKE 1 TABLET (1,000 MG TOTAL) BY MOUTH TWICE A DAY WITH FOOD Patient taking differently: Take 500 mg by mouth daily with breakfast. 08/24/22  Yes Shelva Majestic, MD  metoprolol succinate (TOPROL-XL) 50 MG 24 hr tablet TAKE 1 TABLET BY MOUTH DAILY. TAKE WITH OR IMMEDIATELY FOLLOWING A MEAL. 10/16/22  Yes Shelva Majestic, MD  Multiple Vitamins-Minerals (DECUBI-VITE) CAPS Take 1 capsule by mouth daily.   Yes [provider]  QUEtiapine (SEROQUEL) 25 MG tablet Take 25 mg by mouth at bedtime.   Yes [provider]  blood glucose meter kit and supplies KIT Dispense based on patient and insurance preference. Use up to four times daily as directed. Patient not taking: Reported on 08/01/2023 03/31/22   Shelva Majestic, MD  testosterone cypionate (DEPO-TESTOSTERONE) 200 MG/ML injection Inject 1 mL (200 mg total) into the muscle every 30 (thirty) days. 12/09/10 01/27/11  Swords, Valetta Mole, MD                                                                                                                                    Allergies Alendronate and Metformin and related  Review of Systems Review of Systems As noted in HPI  Physical Exam Vital Signs  I have reviewed the triage vital signs BP (!) 130/101 (BP Location: Left Arm)   Pulse 70   Temp 98.3 F (36.8 C) (Oral)   Resp 18   Ht 5\' 9"  (1.753 m)   Wt 66.5 kg   SpO2 100%   BMI 21.65 kg/m   Physical Exam Vitals reviewed.  Constitutional:      General: He is not in acute distress.    Appearance: He is well-developed. He is not diaphoretic.  HENT:     Head: Normocephalic and  atraumatic.     Right Ear: External ear normal.     Left Ear: External ear normal.     Nose: Nose normal.     Mouth/Throat:  Mouth: Mucous membranes are moist.  Eyes:     General: No scleral icterus.    Conjunctiva/sclera: Conjunctivae normal.  Neck:     Trachea: Phonation normal.  Cardiovascular:     Rate and Rhythm: Normal rate and regular rhythm.  Pulmonary:     Effort: Pulmonary effort is normal. No respiratory distress.     Breath sounds: No stridor.  Abdominal:     General: There is no distension.  Musculoskeletal:        General: Normal range of motion.     Cervical back: Normal range of motion.  Skin:    Findings: Ecchymosis present.       Neurological:     Mental Status: He is alert. Mental status is at baseline. He is disoriented.     Comments: Moves all extremities  Psychiatric:        Behavior: Behavior normal.     ED Results and Treatments Labs (all labs ordered are listed, but only abnormal results are displayed) Labs Reviewed  CBC WITH DIFFERENTIAL/PLATELET - Abnormal; Notable for the following components:      Result Value   RBC 3.68 (*)    Hemoglobin 11.0 (*)    HCT 34.2 (*)    All other components within normal limits  BLOOD GAS, VENOUS - Abnormal; Notable for the following components:   pCO2, Ven 37 (*)    pO2, Ven 82 (*)    All other components within normal limits  COMPREHENSIVE METABOLIC PANEL WITH GFR - Abnormal; Notable for the following components:   Glucose, Bld 184 (*)    BUN 29 (*)    Albumin 3.0 (*)    All other components within normal limits  URINALYSIS, W/ REFLEX TO CULTURE (INFECTION SUSPECTED) - Abnormal; Notable for the following components:   Glucose, UA >=500 (*)    All other components within normal limits                                                                                                                         EKG  EKG Interpretation Date/Time:    Ventricular Rate:    PR Interval:    QRS Duration:     QT Interval:    QTC Calculation:   R Axis:      Text Interpretation:         Radiology No results found.  Medications Ordered in ED Medications  bacitracin 500 UNIT/GM ointment (has no administration in time range)   Procedures Procedures  (including critical care time) Medical Decision Making / ED Course   Medical Decision Making Amount and/or Complexity of Data Reviewed Labs: ordered. Decision-making details documented in ED Course.    Patient has been calm throughout his stay here in the emergency department including obtaining labs and urine.  He has noted to have skin tears in the hands.  These were cleaned and bandaged.  Labs are grossly reassuring without significant electrolyte derangements or renal insufficiency.  Mild hyperglycemia without  DKA.  UA without evidence of infection.  Patient is stable to return to the facility.  Daughter is aware of his return.    Final Clinical Impression(s) / ED Diagnoses Final diagnoses:  Agitation  Multiple skin tears   The patient appears reasonably screened and/or stabilized for discharge and I doubt any other medical condition or other [facility] requiring further screening, evaluation, or treatment in the ED at this time. I have discussed the findings, Dx and Tx plan with the patient/family who expressed understanding and agree(s) with the plan. Discharge instructions discussed at length. The patient/family was given strict return precautions who verbalized understanding of the instructions. No further questions at time of discharge.  Disposition: Discharge  Condition: Good  ED Discharge Orders     None         Follow Up: Almira Jaeger, MD [address] [place] [address] [phone]  Call  to schedule an appointment for close follow up    This chart was dictated using voice recognition software.  Despite best efforts to proofread,  errors can occur which can change the documentation meaning.     Lindle Rhea, MD 08/02/23 [phone]

## 2023-08-01 NOTE — ED Notes (Addendum)
 Steven [name] pt sister [phone] would like pt to be held for a psych eval and medication adjustment. Sister stated he may be having an adverse reaction to Seroquel.

## 2023-08-02 ENCOUNTER — Telehealth: Payer: Self-pay | Admitting: *Deleted

## 2023-08-02 LAB — URINALYSIS, W/ REFLEX TO CULTURE (INFECTION SUSPECTED)
Bacteria, UA: NONE SEEN
Bilirubin Urine: NEGATIVE
Glucose, UA: >=500 mg/dL — AB
Hgb urine dipstick: NEGATIVE
Ketones, ur: NEGATIVE mg/dL
Leukocytes,Ua: NEGATIVE
Nitrite: NEGATIVE
Protein, ur: NEGATIVE mg/dL
Specific Gravity, Urine: 1.018 (ref 1.005–1.030)
pH: 5 (ref 5.0–8.0)

## 2023-08-02 LAB — BLOOD GAS, VENOUS
Acid-Base Excess: 0.5 mmol/L (ref 0.0–2.0)
Bicarbonate: 24.6 mmol/L (ref 20.0–28.0)
Drawn by: 67236
O2 Saturation: 98.6 %
Patient temperature: 37
pCO2, Ven: 37 mmHg — ABNORMAL LOW (ref 44–60)
pH, Ven: 7.43 (ref 7.25–7.43)
pO2, Ven: 82 mmHg — ABNORMAL HIGH (ref 32–45)

## 2023-08-02 LAB — CBC WITH DIFFERENTIAL/PLATELET
Abs Immature Granulocytes: 0.03 10*3/uL (ref 0.00–0.07)
Basophils Absolute: 0.1 10*3/uL (ref 0.0–0.1)
Basophils Relative: 1 %
Eosinophils Absolute: 0.3 10*3/uL (ref 0.0–0.5)
Eosinophils Relative: 3 %
HCT: 34.2 % — ABNORMAL LOW (ref 39.0–52.0)
Hemoglobin: 11 g/dL — ABNORMAL LOW (ref 13.0–17.0)
Immature Granulocytes: 0 %
Lymphocytes Relative: 34 %
Lymphs Abs: 3.2 10*3/uL (ref 0.7–4.0)
MCH: 29.9 pg (ref 26.0–34.0)
MCHC: 32.2 g/dL (ref 30.0–36.0)
MCV: 92.9 fL (ref 80.0–100.0)
Monocytes Absolute: 0.9 10*3/uL (ref 0.1–1.0)
Monocytes Relative: 9 %
Neutro Abs: 5 10*3/uL (ref 1.7–7.7)
Neutrophils Relative %: 53 %
Platelets: 248 10*3/uL (ref 150–400)
RBC: 3.68 MIL/uL — ABNORMAL LOW (ref 4.22–5.81)
RDW: 14.1 % (ref 11.5–15.5)
WBC: 9.5 10*3/uL (ref 4.0–10.5)
nRBC: 0 % (ref 0.0–0.2)

## 2023-08-02 LAB — COMPREHENSIVE METABOLIC PANEL WITH GFR
ALT: 15 U/L (ref 0–44)
AST: 19 U/L (ref 15–41)
Albumin: 3 g/dL — ABNORMAL LOW (ref 3.5–5.0)
Alkaline Phosphatase: 71 U/L (ref 38–126)
Anion gap: 9 (ref 5–15)
BUN: 29 mg/dL — ABNORMAL HIGH (ref 8–23)
CO2: 23 mmol/L (ref 22–32)
Calcium: 8.9 mg/dL (ref 8.9–10.3)
Chloride: 107 mmol/L (ref 98–111)
Creatinine, Ser: 0.92 mg/dL (ref 0.61–1.24)
GFR, Estimated: >60 mL/min (ref >60)
Glucose, Bld: 184 mg/dL — ABNORMAL HIGH (ref 70–99)
Potassium: 4.2 mmol/L (ref 3.5–5.1)
Sodium: 139 mmol/L (ref 135–145)
Total Bilirubin: 0.6 mg/dL (ref 0.0–1.2)
Total Protein: 6.9 g/dL (ref 6.5–8.1)

## 2023-08-02 NOTE — Discharge Instructions (Signed)
 The patient has been calm throughout his stay here without need for medication.  Redirection has worked really well to keep the patient calm.

## 2023-08-02 NOTE — Telephone Encounter (Signed)
 Left detailed message on Melissa's personal voicemail, verbal orders given for Occupational Therapy Frequency: 2 x's a week for 2 weeks  , 1 x a  week 4 weeks okay for pt per Dr. Arlene Ben.

## 2023-08-02 NOTE — Telephone Encounter (Signed)
 Copied from CRM [id number]. Topic: Clinical - Home Health Verbal Orders >> Aug 01, 2023  2:41 PM Albertha Alosa wrote: Caller/Agency: Willim Hart Number: [phone] Service Requested: Occupational Therapy Frequency: 2 week 2 , 1 week 4  Any new concerns about the patient? No

## 2023-08-08 NOTE — Telephone Encounter (Signed)
 Please schedule ov for face to face so orders can be signed.

## 2023-08-08 NOTE — Telephone Encounter (Signed)
 FYI

## 2023-08-09 NOTE — Telephone Encounter (Signed)
Called and lm for pt daughter tcb.  

## 2023-08-09 NOTE — Telephone Encounter (Signed)
 I totally understand the request from patient- the issue is that insurance requires us  to have face to face visit as a part of the home health certification forms. We want to fill the forms out. I could write in explicitly- due to homebound status patient was unable to come into the office but that face to face was not completed- insurance may do the service but its possible they wouldn't cover it- if she wants me to do that I will move forward with that

## 2023-08-14 ENCOUNTER — Telehealth: Payer: Self-pay | Admitting: Family Medicine

## 2023-08-14 NOTE — Telephone Encounter (Signed)
 Received faxed document Home Health Certificate (Order [place] [id number] ), to be filled out by provider. Patient requested to send it back via Fax  Document is located in providers tray at front office.Please advise

## 2023-09-04 NOTE — Telephone Encounter (Signed)
 Order was resent and placed in provider's box.

## 2023-09-06 ENCOUNTER — Telehealth: Payer: Self-pay

## 2023-09-06 NOTE — Telephone Encounter (Signed)
 Form completed and faxed back.  Copied from CRM [id number]. Topic: Clinical - Home Health Verbal Orders >> Sep 03, 2023  3:52 PM Dimple Francis wrote: Reason for CRM: Burdette Carolin [phone] from [facility]- They need the orders for PT and Plan of Care to be ink signed with a pen and not stamp signed. She said they can be signed under the stamp. Please contact Burdette Carolin and soon as possible so they can be fixed.

## 2023-09-06 NOTE — Telephone Encounter (Signed)
 Form faxed back.

## 2023-09-21 ENCOUNTER — Telehealth: Payer: Self-pay | Admitting: Family Medicine

## 2023-09-21 NOTE — Telephone Encounter (Signed)
 [facility] faxed document Home Health Certificate (Order ID [id number]), to be filled out by provider. Patient requested to send it back via Fax within ASAP. Document is located in providers tray at front office.Please advise at [phone].

## 2023-09-24 NOTE — Telephone Encounter (Signed)
 Form in your to sign folder

## 2023-09-24 NOTE — Telephone Encounter (Signed)
Back on your desk.

## 2023-09-25 ENCOUNTER — Telehealth: Payer: Self-pay

## 2023-09-25 NOTE — Telephone Encounter (Signed)
 Paperwork hand signed and faxed back.  Copied from CRM [id number]. Topic: Clinical - Home Health Verbal Orders >> Sep 21, 2023 10:24 AM Antwanette L wrote: Caller/Agency: Kim from [facility] Callback Number: [phone] Kim sent back plan of care order and add on discipline for physical therapy. The orders were hand stamped. The orders need a handwritten signatures

## 2023-09-25 NOTE — Telephone Encounter (Signed)
Paperwork faxed back

## 2023-10-08 ENCOUNTER — Emergency Department (HOSPITAL_COMMUNITY)

## 2023-10-08 ENCOUNTER — Encounter (HOSPITAL_COMMUNITY): Payer: Self-pay | Admitting: Emergency Medicine

## 2023-10-08 ENCOUNTER — Other Ambulatory Visit: Payer: Self-pay

## 2023-10-08 ENCOUNTER — Emergency Department (HOSPITAL_COMMUNITY): Admission: EM | Admit: 2023-10-08 | Discharge: 2023-10-09 | Disposition: A | Discharge status: Home / Self Care

## 2023-10-08 DIAGNOSIS — L89629 Pressure ulcer of left heel, unspecified stage: Secondary | ICD-10-CM | POA: Insufficient documentation

## 2023-10-08 DIAGNOSIS — Z7984 Long term (current) use of oral hypoglycemic drugs: Secondary | ICD-10-CM | POA: Insufficient documentation

## 2023-10-08 DIAGNOSIS — E119 Type 2 diabetes mellitus without complications: Secondary | ICD-10-CM | POA: Insufficient documentation

## 2023-10-08 DIAGNOSIS — Z79899 Other long term (current) drug therapy: Secondary | ICD-10-CM | POA: Diagnosis not present

## 2023-10-08 DIAGNOSIS — L89620 Pressure ulcer of left heel, unstageable: Secondary | ICD-10-CM

## 2023-10-08 LAB — BASIC METABOLIC PANEL WITH GFR
Anion gap: 8 (ref 5–15)
BUN: 33 mg/dL — ABNORMAL HIGH (ref 8–23)
CO2: 23 mmol/L (ref 22–32)
Calcium: 8.5 mg/dL — ABNORMAL LOW (ref 8.9–10.3)
Chloride: 105 mmol/L (ref 98–111)
Creatinine, Ser: 1.15 mg/dL (ref 0.61–1.24)
GFR, Estimated: >60 mL/min (ref >60)
Glucose, Bld: 216 mg/dL — ABNORMAL HIGH (ref 70–99)
Potassium: 4.1 mmol/L (ref 3.5–5.1)
Sodium: 136 mmol/L (ref 135–145)

## 2023-10-08 LAB — CBC
HCT: 31.4 % — ABNORMAL LOW (ref 39.0–52.0)
Hemoglobin: 9.9 g/dL — ABNORMAL LOW (ref 13.0–17.0)
MCH: 29.1 pg (ref 26.0–34.0)
MCHC: 31.5 g/dL (ref 30.0–36.0)
MCV: 92.4 fL (ref 80.0–100.0)
Platelets: 295 10*3/uL (ref 150–400)
RBC: 3.4 MIL/uL — ABNORMAL LOW (ref 4.22–5.81)
RDW: 15.1 % (ref 11.5–15.5)
WBC: 8.5 10*3/uL (ref 4.0–10.5)
nRBC: 0 % (ref 0.0–0.2)

## 2023-10-08 LAB — I-STAT CG4 LACTIC ACID, ED: Lactic Acid, Venous: 1.9 mmol/L (ref 0.5–1.9)

## 2023-10-08 NOTE — ED Notes (Signed)
 New wound dressing applied to the left foot heel. Packed with gauze and covered with adhesive bandaging.

## 2023-10-08 NOTE — ED Provider Notes (Signed)
 [place] EMERGENCY DEPARTMENT AT [facility] Provider Note   CSN: [id number] Arrival date & time: 10/08/23  1950     Patient presents with: No chief complaint on file.   Steven [name] is a 89 y.o. male.   This is a 70-year-old male presenting emergency department for left heel wound.  Demented, cannot provide collateral history.  Daughter notes that he has had pain for the past 3 months.  No fevers or chills.  Being cared for by hospice/palliative care.  Has follow-up with  wound care is coming Thursday.  She was concern for osteomyelitis/infection.        Prior to Admission medications   Medication Sig Start Date End Date Taking? Authorizing Provider  acetaminophen  (TYLENOL ) 500 MG tablet Take 500-1,000 mg by mouth every 8 (eight) hours as needed for mild pain (pain score 1-3) (not to exceed a sum total of 3,000 mg/day).    Serpe, Ronal SQUIBB, NP  Amino Acids-Protein Hydrolys (FEEDING SUPPLEMENT, PRO-STAT SUGAR FREE 64,) LIQD Take 30 mLs by mouth 2 (two) times daily.    [provider]  aspirin  81 MG tablet Take 81 mg by mouth daily.    [provider]  atorvastatin  (LIPITOR) 80 MG tablet TAKE 1 TABLET DAILY. 08/30/21   Katrinka Garnette KIDD, MD  blood glucose meter kit and supplies KIT Dispense based on patient and insurance preference. Use up to four times daily as directed. Patient not taking: Reported on 08/01/2023 03/31/22   Katrinka Garnette KIDD, MD  Cholecalciferol (VITAMIN D3 GUMMIES) 25 MCG (1000 UT) CHEW Chew 1 tablet by mouth daily.    [provider]  Continuous Glucose Receiver (FREESTYLE LIBRE 14 DAY READER) DEVI 1 device as directed as directed  1 reader for DM2 08/01/23   [provider]  Continuous Glucose Sensor (FREESTYLE LIBRE 14 DAY SENSOR) MISC SMARTSIG:1 device Topical As Directed 08/01/23   [provider]  folic acid  (FOLVITE ) 400 MCG tablet Take 400 mcg by mouth daily.    [provider]  gabapentin   (NEURONTIN ) 100 MG capsule Take 1 capsule (100 mg total) by mouth at bedtime. 11/22/21   Joane Artist GORMAN, MD  metFORMIN  (GLUCOPHAGE ) 1000 MG tablet TAKE 1 TABLET (1,000 MG TOTAL) BY MOUTH TWICE A DAY WITH FOOD Patient taking differently: Take 500 mg by mouth daily with breakfast. 08/24/22   Katrinka Garnette KIDD, MD  metoprolol  succinate (TOPROL -XL) 50 MG 24 hr tablet TAKE 1 TABLET BY MOUTH DAILY. TAKE WITH OR IMMEDIATELY FOLLOWING A MEAL. 10/16/22   Katrinka Garnette KIDD, MD  Multiple Vitamins-Minerals (DECUBI-VITE) CAPS Take 1 capsule by mouth daily.    [provider]  QUEtiapine (SEROQUEL) 25 MG tablet Take 25 mg by mouth at bedtime.    [provider]  testosterone  cypionate (DEPO-TESTOSTERONE ) 200 MG/ML injection Inject 1 mL (200 mg total) into the muscle every 30 (thirty) days. 12/09/10 01/27/11  Swords, Wolm DEL, MD    Allergies: Alendronate  and Metformin  and related    Review of Systems  Updated Vital Signs BP 122/84 (BP Location: Left Arm)   Pulse 90   Temp 98.3 F (36.8 C) (Oral)   Resp 18   SpO2 98%   Physical Exam Vitals and nursing note reviewed.  Constitutional:      General: He is not in acute distress.    Appearance: He is not toxic-appearing.  HENT:     Head: Normocephalic.     Mouth/Throat:     Mouth: Mucous membranes are  moist.   Eyes:     Conjunctiva/sclera: Conjunctivae normal.    Cardiovascular:     Rate and Rhythm: Normal rate and regular rhythm.  Pulmonary:     Effort: Pulmonary effort is normal.  Abdominal:     General: Abdomen is flat. There is no distension.     Tenderness: There is no abdominal tenderness. There is no guarding or rebound.   Musculoskeletal:     Comments: Left heel with pressure ulcer.  Eschar.  No surrounding erythema warmth.  No purulent drainage.  2+ DP pulses.   Skin:    Capillary Refill: Capillary refill takes less than 2 seconds.   Neurological:     Mental Status: He is alert. Mental status is at baseline.      (all labs ordered are listed, but only abnormal results are displayed) Labs Reviewed  CBC - Abnormal; Notable for the following components:      Result Value   RBC 3.40 (*)    Hemoglobin 9.9 (*)    HCT 31.4 (*)    All other components within normal limits  BASIC METABOLIC PANEL WITH GFR - Abnormal; Notable for the following components:   Glucose, Bld 216 (*)    BUN 33 (*)    Calcium  8.5 (*)    All other components within normal limits  I-STAT CG4 LACTIC ACID, ED    EKG: None  Radiology: DG Foot Complete Left Result Date: 10/08/2023 CLINICAL DATA:  Left heel ulcer, osteomyelitis EXAM: LEFT FOOT - COMPLETE 3+ VIEW COMPARISON:  None Available. FINDINGS: The osseous structures are diffusely osteopenic. Remote nonunited left fifth metatarsal base fracture with fracture fragments in anatomic alignment. No acute fracture or dislocation. Moderate soft tissue swelling of the left forefoot. Shallow ulcer seen within the soft tissues posterior to the left calcaneus. No superimposed osseous erosions identified to suggest osteomyelitis. IMPRESSION: 1. Soft tissue ulcer posterior to the left calcaneus. No radiographic evidence of osteomyelitis. 2. Moderate forefoot soft tissue swelling. Electronically Signed   By: Dorethia Molt M.D.   On: 10/08/2023 20:53     Procedures   Medications Ordered in the ED - No data to display  Clinical Course as of 10/08/23 2247  Mon Oct 08, 2023  2127 DG Foot Complete Left IMPRESSION: 1. Soft tissue ulcer posterior to the left calcaneus. No radiographic evidence of osteomyelitis. 2. Moderate forefoot soft tissue swelling.   [TY]  2127 Lactic Acid, Venous: 1.9 IMPRESSION: 1. Soft tissue ulcer posterior to the left calcaneus. No radiographic evidence of osteomyelitis. 2. Moderate forefoot soft tissue swelling.   [TY]    Clinical Course User Index [TY] Neysa Caron PARAS, DO                                 Medical Decision Making This is a  89 year old male presenting emergency department from [facility] with possible infection to his left heel.  He is afebrile nontachycardic, normotensive.  Maintaining oxygen saturation on room air.  Exam appears to have chronic pressure ulcer/wound.  Does not appear to be overtly infected.  No leukocytosis or elevated lactate as well.  X-ray negative and not suggestive of osteomyelitis.  Does have elevated glucose, does not appear to be in DKA.  Dressed.  Has follow-up with wound care on Thursday per daughter's report.  Will discharge back to facility.  Amount and/or Complexity of Data Reviewed Independent Historian:     Details:  History provided by daughter External Data Reviewed:     Details: History of diabetes. Labs: ordered. Decision-making details documented in ED Course. Radiology: ordered. Decision-making details documented in ED Course.      Final diagnoses:  None    ED Discharge Orders     None          Neysa Caron PARAS, DO 10/08/23 2247

## 2023-10-08 NOTE — ED Notes (Signed)
 [facility] called for transport

## 2023-10-08 NOTE — ED Triage Notes (Signed)
 BIBA per EMS: Pt coming from [facility] w/ c/o infection of left heel. Hx dementia & diabetes.  VSS

## 2023-10-08 NOTE — Discharge Instructions (Signed)
 Please follow-up with your primary doctors and wound care as planned.  Return if fevers, chills, redness spreads, drains pus or any new or worsening symptoms that are concerning to you.

## 2023-10-11 ENCOUNTER — Other Ambulatory Visit (HOSPITAL_COMMUNITY): Payer: Self-pay

## 2023-10-11 MED ORDER — SILVER SULFADIAZINE 1 % EX CREA
1.0000 | TOPICAL_CREAM | Freq: Every day | CUTANEOUS | 0 refills | Status: AC
  Filled 2023-10-11: qty 400, 30d supply, fill #0

## 2023-10-12 ENCOUNTER — Other Ambulatory Visit (HOSPITAL_COMMUNITY): Payer: Self-pay

## 2023-10-15 ENCOUNTER — Other Ambulatory Visit (HOSPITAL_COMMUNITY): Payer: Self-pay
